# Patient Record
Sex: Female | Born: 1935
Health system: Southern US, Community
[De-identification: ages and names within clinical notes are randomized; demographics above are authoritative.]

## PROBLEM LIST (undated history)

## (undated) ENCOUNTER — Emergency Department: Payer: Medicare Other

## (undated) DIAGNOSIS — Z923 Personal history of irradiation: Secondary | ICD-10-CM

## (undated) DIAGNOSIS — Z86711 Personal history of pulmonary embolism: Secondary | ICD-10-CM

## (undated) DIAGNOSIS — Z803 Family history of malignant neoplasm of breast: Secondary | ICD-10-CM

## (undated) DIAGNOSIS — R569 Unspecified convulsions: Secondary | ICD-10-CM

## (undated) DIAGNOSIS — Z85820 Personal history of malignant melanoma of skin: Secondary | ICD-10-CM

## (undated) DIAGNOSIS — K635 Polyp of colon: Secondary | ICD-10-CM

## (undated) DIAGNOSIS — I10 Essential (primary) hypertension: Secondary | ICD-10-CM

## (undated) DIAGNOSIS — C439 Malignant melanoma of skin, unspecified: Secondary | ICD-10-CM

## (undated) DIAGNOSIS — R42 Dizziness and giddiness: Secondary | ICD-10-CM

## (undated) HISTORY — DX: Malignant melanoma of skin, unspecified: C43.9

## (undated) HISTORY — DX: Family history of malignant neoplasm of breast: Z80.3

## (undated) HISTORY — DX: Personal history of malignant melanoma of skin: Z85.820

## (undated) HISTORY — DX: Essential (primary) hypertension: I10

## (undated) HISTORY — DX: Personal history of pulmonary embolism: Z86.711

## (undated) HISTORY — DX: Unspecified convulsions: R56.9

## (undated) HISTORY — DX: Polyp of colon: K63.5

---

## 1991-12-04 DIAGNOSIS — R569 Unspecified convulsions: Secondary | ICD-10-CM

## 1991-12-04 HISTORY — DX: Unspecified convulsions: R56.9

## 2001-12-03 DIAGNOSIS — Z85820 Personal history of malignant melanoma of skin: Secondary | ICD-10-CM

## 2001-12-03 DIAGNOSIS — C439 Malignant melanoma of skin, unspecified: Secondary | ICD-10-CM

## 2001-12-03 HISTORY — DX: Malignant melanoma of skin, unspecified: C43.9

## 2001-12-03 HISTORY — DX: Personal history of malignant melanoma of skin: Z85.820

## 2001-12-03 HISTORY — PX: OTHER SURGICAL HISTORY: SHX169

## 2009-12-03 DIAGNOSIS — Z86711 Personal history of pulmonary embolism: Secondary | ICD-10-CM

## 2009-12-03 HISTORY — DX: Personal history of pulmonary embolism: Z86.711

## 2011-05-04 HISTORY — PX: OTHER SURGICAL HISTORY: SHX169

## 2011-05-20 DIAGNOSIS — R55 Syncope and collapse: Secondary | ICD-10-CM | POA: Insufficient documentation

## 2011-05-21 DIAGNOSIS — S61419A Laceration without foreign body of unspecified hand, initial encounter: Secondary | ICD-10-CM | POA: Insufficient documentation

## 2011-05-21 DIAGNOSIS — Z86718 Personal history of other venous thrombosis and embolism: Secondary | ICD-10-CM | POA: Insufficient documentation

## 2011-08-12 ENCOUNTER — Emergency Department (HOSPITAL_COMMUNITY)
Admission: EM | Admit: 2011-08-12 | Discharge: 2011-08-12 | Disposition: A | Discharge status: Home / Self Care | Payer: Medicare Other | Attending: Emergency Medicine | Admitting: Emergency Medicine

## 2011-08-12 DIAGNOSIS — M62838 Other muscle spasm: Secondary | ICD-10-CM | POA: Insufficient documentation

## 2011-08-12 DIAGNOSIS — I10 Essential (primary) hypertension: Secondary | ICD-10-CM | POA: Insufficient documentation

## 2011-08-12 DIAGNOSIS — Z86718 Personal history of other venous thrombosis and embolism: Secondary | ICD-10-CM | POA: Insufficient documentation

## 2011-08-12 DIAGNOSIS — F411 Generalized anxiety disorder: Secondary | ICD-10-CM | POA: Insufficient documentation

## 2011-08-12 DIAGNOSIS — Z7901 Long term (current) use of anticoagulants: Secondary | ICD-10-CM | POA: Insufficient documentation

## 2011-08-12 DIAGNOSIS — R259 Unspecified abnormal involuntary movements: Secondary | ICD-10-CM | POA: Insufficient documentation

## 2011-08-12 LAB — POCT I-STAT, CHEM 8
Calcium, Ion: 1.23 mmol/L (ref 1.12–1.32)
HCT: 40 % (ref 36.0–46.0)
TCO2: 25 mmol/L (ref 0–100)

## 2011-08-12 LAB — PROTIME-INR
INR: 1.97 — ABNORMAL HIGH (ref 0.00–1.49)
Prothrombin Time: 22.8 seconds — ABNORMAL HIGH (ref 11.6–15.2)

## 2011-12-05 DIAGNOSIS — Z7901 Long term (current) use of anticoagulants: Secondary | ICD-10-CM | POA: Diagnosis not present

## 2011-12-12 DIAGNOSIS — Z7901 Long term (current) use of anticoagulants: Secondary | ICD-10-CM | POA: Diagnosis not present

## 2011-12-19 DIAGNOSIS — M79609 Pain in unspecified limb: Secondary | ICD-10-CM | POA: Diagnosis not present

## 2011-12-19 DIAGNOSIS — B351 Tinea unguium: Secondary | ICD-10-CM | POA: Diagnosis not present

## 2011-12-19 DIAGNOSIS — Z7901 Long term (current) use of anticoagulants: Secondary | ICD-10-CM | POA: Diagnosis not present

## 2011-12-20 DIAGNOSIS — M654 Radial styloid tenosynovitis [de Quervain]: Secondary | ICD-10-CM | POA: Diagnosis not present

## 2011-12-24 DIAGNOSIS — Y929 Unspecified place or not applicable: Secondary | ICD-10-CM | POA: Diagnosis not present

## 2011-12-24 DIAGNOSIS — X58XXXA Exposure to other specified factors, initial encounter: Secondary | ICD-10-CM | POA: Diagnosis not present

## 2011-12-24 DIAGNOSIS — M79609 Pain in unspecified limb: Secondary | ICD-10-CM | POA: Diagnosis not present

## 2011-12-24 DIAGNOSIS — IMO0002 Reserved for concepts with insufficient information to code with codable children: Secondary | ICD-10-CM | POA: Diagnosis not present

## 2011-12-28 DIAGNOSIS — IMO0001 Reserved for inherently not codable concepts without codable children: Secondary | ICD-10-CM | POA: Diagnosis not present

## 2012-01-04 DIAGNOSIS — IMO0001 Reserved for inherently not codable concepts without codable children: Secondary | ICD-10-CM | POA: Diagnosis not present

## 2012-01-04 DIAGNOSIS — M79609 Pain in unspecified limb: Secondary | ICD-10-CM | POA: Diagnosis not present

## 2012-01-08 DIAGNOSIS — N812 Incomplete uterovaginal prolapse: Secondary | ICD-10-CM | POA: Diagnosis not present

## 2012-01-08 DIAGNOSIS — H2510 Age-related nuclear cataract, unspecified eye: Secondary | ICD-10-CM | POA: Diagnosis not present

## 2012-01-08 DIAGNOSIS — H524 Presbyopia: Secondary | ICD-10-CM | POA: Diagnosis not present

## 2012-01-08 DIAGNOSIS — H25019 Cortical age-related cataract, unspecified eye: Secondary | ICD-10-CM | POA: Diagnosis not present

## 2012-01-08 DIAGNOSIS — A58 Granuloma inguinale: Secondary | ICD-10-CM | POA: Diagnosis not present

## 2012-01-08 DIAGNOSIS — N8111 Cystocele, midline: Secondary | ICD-10-CM | POA: Diagnosis not present

## 2012-01-08 DIAGNOSIS — N952 Postmenopausal atrophic vaginitis: Secondary | ICD-10-CM | POA: Diagnosis not present

## 2012-01-08 DIAGNOSIS — H526 Other disorders of refraction: Secondary | ICD-10-CM | POA: Diagnosis not present

## 2012-01-08 DIAGNOSIS — Z4689 Encounter for fitting and adjustment of other specified devices: Secondary | ICD-10-CM | POA: Diagnosis not present

## 2012-01-09 DIAGNOSIS — Z7901 Long term (current) use of anticoagulants: Secondary | ICD-10-CM | POA: Diagnosis not present

## 2012-01-16 DIAGNOSIS — Z7901 Long term (current) use of anticoagulants: Secondary | ICD-10-CM | POA: Diagnosis not present

## 2012-01-29 DIAGNOSIS — Z7901 Long term (current) use of anticoagulants: Secondary | ICD-10-CM | POA: Diagnosis not present

## 2012-02-27 DIAGNOSIS — Z7901 Long term (current) use of anticoagulants: Secondary | ICD-10-CM | POA: Diagnosis not present

## 2012-03-19 DIAGNOSIS — M79609 Pain in unspecified limb: Secondary | ICD-10-CM | POA: Diagnosis not present

## 2012-03-19 DIAGNOSIS — B351 Tinea unguium: Secondary | ICD-10-CM | POA: Diagnosis not present

## 2012-03-19 DIAGNOSIS — Z7901 Long term (current) use of anticoagulants: Secondary | ICD-10-CM | POA: Diagnosis not present

## 2012-03-20 DIAGNOSIS — Z09 Encounter for follow-up examination after completed treatment for conditions other than malignant neoplasm: Secondary | ICD-10-CM | POA: Diagnosis not present

## 2012-03-20 DIAGNOSIS — M65849 Other synovitis and tenosynovitis, unspecified hand: Secondary | ICD-10-CM | POA: Diagnosis not present

## 2012-03-20 DIAGNOSIS — M65839 Other synovitis and tenosynovitis, unspecified forearm: Secondary | ICD-10-CM | POA: Diagnosis not present

## 2012-04-01 DIAGNOSIS — G40909 Epilepsy, unspecified, not intractable, without status epilepticus: Secondary | ICD-10-CM | POA: Diagnosis not present

## 2012-04-01 DIAGNOSIS — R002 Palpitations: Secondary | ICD-10-CM | POA: Diagnosis not present

## 2012-04-01 DIAGNOSIS — M5137 Other intervertebral disc degeneration, lumbosacral region: Secondary | ICD-10-CM | POA: Diagnosis not present

## 2012-04-01 DIAGNOSIS — Z7901 Long term (current) use of anticoagulants: Secondary | ICD-10-CM | POA: Diagnosis not present

## 2012-04-01 DIAGNOSIS — D709 Neutropenia, unspecified: Secondary | ICD-10-CM | POA: Diagnosis not present

## 2012-04-01 DIAGNOSIS — E7800 Pure hypercholesterolemia, unspecified: Secondary | ICD-10-CM | POA: Diagnosis not present

## 2012-04-01 DIAGNOSIS — N8111 Cystocele, midline: Secondary | ICD-10-CM | POA: Diagnosis not present

## 2012-04-01 DIAGNOSIS — I10 Essential (primary) hypertension: Secondary | ICD-10-CM | POA: Diagnosis not present

## 2012-04-01 DIAGNOSIS — R3129 Other microscopic hematuria: Secondary | ICD-10-CM | POA: Diagnosis not present

## 2012-04-08 DIAGNOSIS — R3129 Other microscopic hematuria: Secondary | ICD-10-CM | POA: Diagnosis not present

## 2012-04-08 DIAGNOSIS — I10 Essential (primary) hypertension: Secondary | ICD-10-CM | POA: Diagnosis not present

## 2012-04-08 DIAGNOSIS — G40909 Epilepsy, unspecified, not intractable, without status epilepticus: Secondary | ICD-10-CM | POA: Diagnosis not present

## 2012-04-08 DIAGNOSIS — E7800 Pure hypercholesterolemia, unspecified: Secondary | ICD-10-CM | POA: Diagnosis not present

## 2012-04-09 DIAGNOSIS — R3129 Other microscopic hematuria: Secondary | ICD-10-CM | POA: Diagnosis not present

## 2012-04-09 DIAGNOSIS — G40909 Epilepsy, unspecified, not intractable, without status epilepticus: Secondary | ICD-10-CM | POA: Diagnosis not present

## 2012-04-09 DIAGNOSIS — H6120 Impacted cerumen, unspecified ear: Secondary | ICD-10-CM | POA: Diagnosis not present

## 2012-04-09 DIAGNOSIS — D1803 Hemangioma of intra-abdominal structures: Secondary | ICD-10-CM | POA: Diagnosis not present

## 2012-04-09 DIAGNOSIS — I2782 Chronic pulmonary embolism: Secondary | ICD-10-CM | POA: Diagnosis not present

## 2012-04-09 DIAGNOSIS — Z7901 Long term (current) use of anticoagulants: Secondary | ICD-10-CM | POA: Diagnosis not present

## 2012-04-09 DIAGNOSIS — Z7189 Other specified counseling: Secondary | ICD-10-CM | POA: Diagnosis not present

## 2012-04-09 DIAGNOSIS — Z85820 Personal history of malignant melanoma of skin: Secondary | ICD-10-CM | POA: Diagnosis not present

## 2012-04-09 DIAGNOSIS — K824 Cholesterolosis of gallbladder: Secondary | ICD-10-CM | POA: Diagnosis not present

## 2012-04-09 DIAGNOSIS — I10 Essential (primary) hypertension: Secondary | ICD-10-CM | POA: Diagnosis not present

## 2012-04-09 DIAGNOSIS — D709 Neutropenia, unspecified: Secondary | ICD-10-CM | POA: Diagnosis not present

## 2012-04-09 DIAGNOSIS — M5137 Other intervertebral disc degeneration, lumbosacral region: Secondary | ICD-10-CM | POA: Diagnosis not present

## 2012-04-09 DIAGNOSIS — R002 Palpitations: Secondary | ICD-10-CM | POA: Diagnosis not present

## 2012-04-09 DIAGNOSIS — E7800 Pure hypercholesterolemia, unspecified: Secondary | ICD-10-CM | POA: Diagnosis not present

## 2012-04-09 DIAGNOSIS — Q441 Other congenital malformations of gallbladder: Secondary | ICD-10-CM | POA: Diagnosis not present

## 2012-04-09 DIAGNOSIS — N8111 Cystocele, midline: Secondary | ICD-10-CM | POA: Diagnosis not present

## 2012-04-09 DIAGNOSIS — Z5181 Encounter for therapeutic drug level monitoring: Secondary | ICD-10-CM | POA: Diagnosis not present

## 2012-04-11 DIAGNOSIS — N8111 Cystocele, midline: Secondary | ICD-10-CM | POA: Diagnosis not present

## 2012-04-11 DIAGNOSIS — Z4689 Encounter for fitting and adjustment of other specified devices: Secondary | ICD-10-CM | POA: Diagnosis not present

## 2012-04-15 DIAGNOSIS — D1803 Hemangioma of intra-abdominal structures: Secondary | ICD-10-CM | POA: Diagnosis not present

## 2012-04-15 DIAGNOSIS — R059 Cough, unspecified: Secondary | ICD-10-CM | POA: Diagnosis not present

## 2012-04-15 DIAGNOSIS — R3129 Other microscopic hematuria: Secondary | ICD-10-CM | POA: Diagnosis not present

## 2012-04-15 DIAGNOSIS — R05 Cough: Secondary | ICD-10-CM | POA: Diagnosis not present

## 2012-04-15 DIAGNOSIS — Q447 Other congenital malformations of liver: Secondary | ICD-10-CM | POA: Diagnosis not present

## 2012-04-15 DIAGNOSIS — Q441 Other congenital malformations of gallbladder: Secondary | ICD-10-CM | POA: Diagnosis not present

## 2012-04-30 DIAGNOSIS — R42 Dizziness and giddiness: Secondary | ICD-10-CM | POA: Diagnosis not present

## 2012-04-30 DIAGNOSIS — Z7901 Long term (current) use of anticoagulants: Secondary | ICD-10-CM | POA: Diagnosis not present

## 2012-05-02 DIAGNOSIS — H7200 Central perforation of tympanic membrane, unspecified ear: Secondary | ICD-10-CM | POA: Diagnosis not present

## 2012-05-02 DIAGNOSIS — H9190 Unspecified hearing loss, unspecified ear: Secondary | ICD-10-CM | POA: Diagnosis not present

## 2012-05-02 DIAGNOSIS — Z7901 Long term (current) use of anticoagulants: Secondary | ICD-10-CM | POA: Diagnosis not present

## 2012-05-05 DIAGNOSIS — Z7901 Long term (current) use of anticoagulants: Secondary | ICD-10-CM | POA: Diagnosis not present

## 2012-05-12 DIAGNOSIS — Z7901 Long term (current) use of anticoagulants: Secondary | ICD-10-CM | POA: Diagnosis not present

## 2012-05-13 ENCOUNTER — Ambulatory Visit (INDEPENDENT_AMBULATORY_CARE_PROVIDER_SITE_OTHER): Payer: Medicare Other | Admitting: Family Medicine

## 2012-05-13 ENCOUNTER — Encounter: Payer: Self-pay | Admitting: Family Medicine

## 2012-05-13 VITALS — BP 140/82 | HR 76 | Temp 97.6°F | Ht 64.25 in | Wt 153.0 lb

## 2012-05-13 DIAGNOSIS — Z4689 Encounter for fitting and adjustment of other specified devices: Secondary | ICD-10-CM

## 2012-05-13 DIAGNOSIS — Z85820 Personal history of malignant melanoma of skin: Secondary | ICD-10-CM | POA: Insufficient documentation

## 2012-05-13 DIAGNOSIS — B351 Tinea unguium: Secondary | ICD-10-CM | POA: Diagnosis not present

## 2012-05-13 DIAGNOSIS — Z86711 Personal history of pulmonary embolism: Secondary | ICD-10-CM | POA: Insufficient documentation

## 2012-05-13 DIAGNOSIS — I10 Essential (primary) hypertension: Secondary | ICD-10-CM | POA: Insufficient documentation

## 2012-05-13 DIAGNOSIS — N814 Uterovaginal prolapse, unspecified: Secondary | ICD-10-CM | POA: Diagnosis not present

## 2012-05-13 DIAGNOSIS — R569 Unspecified convulsions: Secondary | ICD-10-CM

## 2012-05-13 NOTE — Progress Notes (Signed)
Subjective:    Patient ID: Becky Farrell, female    DOB: July 15, 1934, 78 y.o.   MRN: [id number]  HPI  Very pleasant 78 yo female here to establish care.  She and her husband recently moved to [facility] from [place] to be closer to her children and grandchildren.  H/o PE- Had multiple PEs in 2011 when driving [place] the country. Started on coumadin and stopped it 6 months later at the advise of her physician since it was her only event.  Had another PE months later, restarted on life long coumadin. Just had INR checked yesterday at [facility]- PT 2.57.  Uterine prolapse- Has been wearing a pessary, getting it adjusted and cleaned every 3 months.  Needs GYN referral to continue this here. Incontinence has improved and tolerating the pessary well.  H/o malignant melanoma- s/p excision on her right leg in 2003.  HTN- on lisinopril 20 mg daily and HCTZ 25 mg daily. Denies any CP, SOB or LE edema.  H/o seizure- had one seizure in 2003. Per pt, had full work up. Has been on Tegretol since. No recurrent seizures.  Patient Active Problem List  Diagnoses  . Hypertension  . Seizures  . History of pulmonary embolism  . Pessary maintenance  . Uterine prolapse  . History of melanoma   Past Medical History  Diagnosis Date  . Hypertension   . Seizures   . History of pulmonary embolism   . Melanoma 2003  . Colon polyps    Past Surgical History  Procedure Date  . Left hand surgery 05/2011    s/p fall   History  Substance Use Topics  . Smoking status: Never Smoker   . Smokeless tobacco: Not on file  . Alcohol Use: Not on file   Family History  Problem Relation Age of Onset  . Heart disease Mother    Allergies  Allergen Reactions  . Clobetasol     vertigo  . Phenergan (Promethazine Hcl)     ? Rapid heart rate  . Zithromax (Azithromycin)     ? Rapid heart rate   Current Outpatient Prescriptions on File Prior to Visit  Medication Sig Dispense Refill  . atenolol (TENORMIN)  25 MG tablet Take 25 mg by mouth daily.      . calcium carbonate (OS-CAL) 600 MG TABS Take 600 mg by mouth 2 (two) times daily with a meal.      . carbamazepine (TEGRETOL) 200 MG tablet Take 200 mg by mouth 4 (four) times daily.      . hydrochlorothiazide (HYDRODIURIL) 25 MG tablet Take 25 mg by mouth daily.      Marland Kitchen lisinopril (PRINIVIL,ZESTRIL) 20 MG tablet Take 20 mg by mouth daily.      Marland Kitchen warfarin (COUMADIN) 10 MG tablet Take one tablet alternating with one half tablet daily.       The PMH, PSH, Social History, Family History, Medications, and allergies have been reviewed in [facility], and have been updated if relevant.   Review of Systems     Objective:   Physical Exam BP 140/82  Pulse 76  Temp 97.6 F (36.4 C)  Ht 5' 4.25" (1.632 m)  Wt 153 lb (69.4 kg)  BMI 26.06 kg/m2  General:  Well-developed,well-nourished,in no acute distress; alert,appropriate and cooperative throughout examination Head:  normocephalic and atraumatic.   Eyes:  vision grossly intact, pupils equal, pupils round, and pupils reactive to light.   Ears:  R ear normal and L ear normal.   Nose:  no external deformity.   Mouth:  good dentition.   Lungs:  Normal respiratory effort, chest expands symmetrically. Lungs are clear to auscultation, no crackles or wheezes. Heart:  Normal rate and regular rhythm. S1 and S2 normal without gallop, murmur, click, rub or other extra sounds. Abdomen:  Bowel sounds positive,abdomen soft and non-tender without masses, organomegaly or hernias noted. Neurologic:  alert & oriented X3 and gait normal.   Skin:  Large, well healed excisional scar on right lower leg Thickened nails- all toes, great toes most severe Psych:  Cognition and judgment appear intact. Alert and cooperative with normal attention span and concentration. No apparent delusions, illusions, hallucinations      Assessment & Plan:   1. Pessary maintenance  Stable- refer to GYN Ambulatory referral to Gynecology  2.  Uterine prolapse  Symptoms improved with pessary. See above.   3. History of melanoma  Refer to derm for yearly surveillance. Ambulatory referral to Dermatology  4. Nail fungus  Refer to podiatry for maintenance since she is on life long anticoagulation. Ambulatory referral to Podiatry  5. Hypertension  Stable on current meds.   6. History of pulmonary embolism  Life long coumadin. Continue current dose. She will check PT/INR next week at [facility].   7. Seizures  No recurrent seizures. Awaiting records- had recent blood work per pt. Continue Tegretol.

## 2012-05-13 NOTE — Patient Instructions (Signed)
It was such a pleasure to meet you. Please stop by to see Shirlee Limerick on your way out to set up your referrals. I will call you with your INR (coumadin) results after you get them drawn at [facility] next week.

## 2012-05-19 ENCOUNTER — Encounter: Payer: Self-pay | Admitting: Family Medicine

## 2012-05-19 DIAGNOSIS — Z7901 Long term (current) use of anticoagulants: Secondary | ICD-10-CM | POA: Diagnosis not present

## 2012-05-20 ENCOUNTER — Encounter: Payer: Self-pay | Admitting: Family Medicine

## 2012-05-20 ENCOUNTER — Telehealth: Payer: Self-pay | Admitting: Family Medicine

## 2012-05-20 NOTE — Telephone Encounter (Signed)
[facility] called to ask Dr Dayton Martes for an order for Occupational Therapy for patient to have a Grab Bar installed in her home. Please put order and fax to [facility] at [facility] Not urgent and can wait till Wednesday 05/21/12.

## 2012-05-21 NOTE — Telephone Encounter (Signed)
Shirlee Limerick to fax order.

## 2012-05-21 NOTE — Telephone Encounter (Signed)
Rx written.  In my box. 

## 2012-05-23 DIAGNOSIS — M6281 Muscle weakness (generalized): Secondary | ICD-10-CM | POA: Diagnosis not present

## 2012-05-26 ENCOUNTER — Encounter: Payer: Self-pay | Admitting: Family Medicine

## 2012-05-26 DIAGNOSIS — Z7901 Long term (current) use of anticoagulants: Secondary | ICD-10-CM | POA: Diagnosis not present

## 2012-05-26 DIAGNOSIS — M6281 Muscle weakness (generalized): Secondary | ICD-10-CM | POA: Diagnosis not present

## 2012-05-28 ENCOUNTER — Other Ambulatory Visit: Payer: Self-pay | Admitting: Family Medicine

## 2012-05-28 MED ORDER — WARFARIN SODIUM 10 MG PO TABS: ORAL_TABLET | ORAL | Status: DC

## 2012-05-28 NOTE — Telephone Encounter (Signed)
Ok to refill 1 month supply of warfarin. I am not familiar with the dosing for this type of lubricant jelly.  Please ask pharmacy to send [place] refill request with dose and instructions.  I know she uses it for her pessary. Thank you.

## 2012-05-28 NOTE — Telephone Encounter (Signed)
Pt does not have appointment until 07/15/12 with Dr. Lisbeth Ply the GYN physician. And she is needing Trimo-San Jelly before then. [facility] [address] application twice a week. She also needs refill on Warfarin Sodium 10 mg.

## 2012-05-28 NOTE — Telephone Encounter (Signed)
Warfarin sent to pharmacy.  Pharmacist said that pt has a refill on the gel on hold, [facility] [place].  He will fill that refill.

## 2012-06-09 DIAGNOSIS — Z7901 Long term (current) use of anticoagulants: Secondary | ICD-10-CM | POA: Diagnosis not present

## 2012-06-10 ENCOUNTER — Encounter: Payer: Self-pay | Admitting: Family Medicine

## 2012-06-16 DIAGNOSIS — Z7901 Long term (current) use of anticoagulants: Secondary | ICD-10-CM | POA: Diagnosis not present

## 2012-06-25 ENCOUNTER — Other Ambulatory Visit: Payer: Self-pay | Admitting: Family Medicine

## 2012-06-30 DIAGNOSIS — Z7901 Long term (current) use of anticoagulants: Secondary | ICD-10-CM | POA: Diagnosis not present

## 2012-07-01 ENCOUNTER — Encounter: Payer: Self-pay | Admitting: Family Medicine

## 2012-07-03 DIAGNOSIS — L570 Actinic keratosis: Secondary | ICD-10-CM | POA: Diagnosis not present

## 2012-07-03 DIAGNOSIS — L820 Inflamed seborrheic keratosis: Secondary | ICD-10-CM | POA: Diagnosis not present

## 2012-07-03 DIAGNOSIS — Z85820 Personal history of malignant melanoma of skin: Secondary | ICD-10-CM | POA: Diagnosis not present

## 2012-07-03 DIAGNOSIS — L989 Disorder of the skin and subcutaneous tissue, unspecified: Secondary | ICD-10-CM | POA: Diagnosis not present

## 2012-07-07 ENCOUNTER — Encounter: Payer: Self-pay | Admitting: Family Medicine

## 2012-07-07 DIAGNOSIS — Z7901 Long term (current) use of anticoagulants: Secondary | ICD-10-CM | POA: Diagnosis not present

## 2012-07-09 DIAGNOSIS — M2040 Other hammer toe(s) (acquired), unspecified foot: Secondary | ICD-10-CM | POA: Diagnosis not present

## 2012-07-09 DIAGNOSIS — B351 Tinea unguium: Secondary | ICD-10-CM | POA: Diagnosis not present

## 2012-07-09 DIAGNOSIS — M79609 Pain in unspecified limb: Secondary | ICD-10-CM | POA: Diagnosis not present

## 2012-07-15 DIAGNOSIS — N812 Incomplete uterovaginal prolapse: Secondary | ICD-10-CM | POA: Diagnosis not present

## 2012-07-21 ENCOUNTER — Encounter: Payer: Self-pay | Admitting: Family Medicine

## 2012-07-21 DIAGNOSIS — Z7901 Long term (current) use of anticoagulants: Secondary | ICD-10-CM | POA: Diagnosis not present

## 2012-08-05 ENCOUNTER — Other Ambulatory Visit: Payer: Self-pay

## 2012-08-05 MED ORDER — CARBAMAZEPINE 200 MG PO TABS: 200.0000 mg | ORAL_TABLET | Freq: Four times a day (QID) | ORAL | Status: DC

## 2012-08-05 NOTE — Telephone Encounter (Signed)
Pt has not had Tegretol refilled since moved here from [place]. Pt is not out of med. Pt [facility]. Pt wants to know when to schedule next appt with Dr Dayton Martes.Please advise.

## 2012-08-05 NOTE — Telephone Encounter (Signed)
Ok to refill one month with 3 refills.  I would like to see her in the next 3 months.

## 2012-08-05 NOTE — Telephone Encounter (Signed)
Medicine called to pharmacy, advised patient, follow up appt scheduled.

## 2012-08-07 ENCOUNTER — Encounter: Payer: Self-pay | Admitting: Family Medicine

## 2012-08-07 DIAGNOSIS — Z7901 Long term (current) use of anticoagulants: Secondary | ICD-10-CM | POA: Diagnosis not present

## 2012-08-14 DIAGNOSIS — N812 Incomplete uterovaginal prolapse: Secondary | ICD-10-CM | POA: Diagnosis not present

## 2012-08-20 ENCOUNTER — Ambulatory Visit (INDEPENDENT_AMBULATORY_CARE_PROVIDER_SITE_OTHER): Payer: Medicare Other

## 2012-08-20 DIAGNOSIS — Z23 Encounter for immunization: Secondary | ICD-10-CM | POA: Diagnosis not present

## 2012-09-04 ENCOUNTER — Telehealth: Payer: Self-pay

## 2012-09-04 DIAGNOSIS — Z7901 Long term (current) use of anticoagulants: Secondary | ICD-10-CM | POA: Diagnosis not present

## 2012-09-04 MED ORDER — ZOSTER VACCINE LIVE 19400 UNT/0.65ML ~~LOC~~ SOLR: 0.6500 mL | Freq: Once | SUBCUTANEOUS | Status: DC

## 2012-09-04 NOTE — Telephone Encounter (Signed)
Rx printed

## 2012-09-04 NOTE — Telephone Encounter (Signed)
Advised patient's husband that script is ready for pick up.

## 2012-09-04 NOTE — Telephone Encounter (Signed)
Pt left v/m requesting shingles vaccine rx written. Pt would like to pick up 09/05/12.Please advise.

## 2012-09-05 ENCOUNTER — Encounter: Payer: Self-pay | Admitting: Family Medicine

## 2012-09-15 DIAGNOSIS — N812 Incomplete uterovaginal prolapse: Secondary | ICD-10-CM | POA: Diagnosis not present

## 2012-09-17 DIAGNOSIS — N814 Uterovaginal prolapse, unspecified: Secondary | ICD-10-CM | POA: Diagnosis not present

## 2012-10-02 ENCOUNTER — Encounter: Payer: Self-pay | Admitting: Family Medicine

## 2012-10-02 DIAGNOSIS — Z7901 Long term (current) use of anticoagulants: Secondary | ICD-10-CM | POA: Diagnosis not present

## 2012-10-07 DIAGNOSIS — B351 Tinea unguium: Secondary | ICD-10-CM | POA: Diagnosis not present

## 2012-10-07 DIAGNOSIS — M79609 Pain in unspecified limb: Secondary | ICD-10-CM | POA: Diagnosis not present

## 2012-10-14 ENCOUNTER — Other Ambulatory Visit: Payer: Self-pay | Admitting: Family Medicine

## 2012-10-21 DIAGNOSIS — N812 Incomplete uterovaginal prolapse: Secondary | ICD-10-CM | POA: Diagnosis not present

## 2012-10-24 DIAGNOSIS — L820 Inflamed seborrheic keratosis: Secondary | ICD-10-CM | POA: Diagnosis not present

## 2012-10-24 DIAGNOSIS — L219 Seborrheic dermatitis, unspecified: Secondary | ICD-10-CM | POA: Diagnosis not present

## 2012-10-24 DIAGNOSIS — R21 Rash and other nonspecific skin eruption: Secondary | ICD-10-CM | POA: Diagnosis not present

## 2012-10-27 ENCOUNTER — Encounter: Payer: Self-pay | Admitting: Family Medicine

## 2012-10-27 DIAGNOSIS — Z7901 Long term (current) use of anticoagulants: Secondary | ICD-10-CM | POA: Diagnosis not present

## 2012-11-04 ENCOUNTER — Ambulatory Visit (INDEPENDENT_AMBULATORY_CARE_PROVIDER_SITE_OTHER): Payer: Medicare Other | Admitting: Family Medicine

## 2012-11-04 ENCOUNTER — Encounter: Payer: Self-pay | Admitting: Family Medicine

## 2012-11-04 VITALS — BP 112/70 | HR 64 | Temp 97.6°F | Wt 156.0 lb

## 2012-11-04 DIAGNOSIS — R569 Unspecified convulsions: Secondary | ICD-10-CM

## 2012-11-04 DIAGNOSIS — I10 Essential (primary) hypertension: Secondary | ICD-10-CM | POA: Diagnosis not present

## 2012-11-04 DIAGNOSIS — Z13820 Encounter for screening for osteoporosis: Secondary | ICD-10-CM

## 2012-11-04 DIAGNOSIS — Z136 Encounter for screening for cardiovascular disorders: Secondary | ICD-10-CM

## 2012-11-04 DIAGNOSIS — Z1231 Encounter for screening mammogram for malignant neoplasm of breast: Secondary | ICD-10-CM

## 2012-11-04 LAB — LIPID PANEL
Cholesterol: 264 mg/dL — ABNORMAL HIGH (ref 0–200)
Total CHOL/HDL Ratio: 3
Triglycerides: 66 mg/dL (ref 0.0–149.0)
VLDL: 13.2 mg/dL (ref 0.0–40.0)

## 2012-11-04 LAB — CBC WITH DIFFERENTIAL/PLATELET
Basophils Absolute: 0 10*3/uL (ref 0.0–0.1)
Eosinophils Absolute: 0 10*3/uL (ref 0.0–0.7)
HCT: 39.5 % (ref 36.0–46.0)
Lymphs Abs: 0.9 10*3/uL (ref 0.7–4.0)
MCHC: 32.9 g/dL (ref 30.0–36.0)
Monocytes Relative: 10.7 % (ref 3.0–12.0)
Platelets: 183 10*3/uL (ref 150.0–400.0)
RDW: 12.6 % (ref 11.5–14.6)

## 2012-11-04 LAB — COMPREHENSIVE METABOLIC PANEL
Albumin: 3.9 g/dL (ref 3.5–5.2)
Alkaline Phosphatase: 83 U/L (ref 39–117)
BUN: 15 mg/dL (ref 6–23)
GFR: 94.13 mL/min (ref >60.00)
Glucose, Bld: 93 mg/dL (ref 70–99)
Total Bilirubin: 0.5 mg/dL (ref 0.3–1.2)

## 2012-11-04 LAB — LDL CHOLESTEROL, DIRECT: Direct LDL: 158.5 mg/dL

## 2012-11-04 MED ORDER — TEGRETOL 200 MG PO TABS: ORAL_TABLET | ORAL | Status: DC

## 2012-11-04 NOTE — Progress Notes (Signed)
Subjective:    Patient ID: Becky Farrell, female    DOB: [birth year], 78 y.o.   MRN: [id number]  HPI  Very pleasant 78 yo female here for 6 month follow up.    H/o PE- Had multiple PEs in 2011 when driving across the country. Started on coumadin and stopped it 6 months later at the advise of her physician since it was her only event.  Had another PE months later, restarted on life long coumadin. Just had INR checked last week at [facility].  Has a pesssary- followed by GYN.  H/o malignant melanoma- s/p excision on her right leg in 2003.  HTN- on lisinopril 20 mg daily and HCTZ 25 mg daily. Denies any CP, SOB or LE edema.  H/o seizure- had one seizure in 2003. Per pt, had full work up. Has been on Tegretol since.  Due for labs. No recurrent seizures.  Patient Active Problem List  Diagnosis  . Hypertension  . Seizures  . History of pulmonary embolism  . Pessary maintenance  . Uterine prolapse  . History of melanoma   Past Medical History  Diagnosis Date  . Hypertension   . Seizures   . History of pulmonary embolism   . Melanoma 2003  . Colon polyps    Past Surgical History  Procedure Date  . Left hand surgery 05/2011    s/p fall   History  Substance Use Topics  . Smoking status: Never Smoker   . Smokeless tobacco: Not on file  . Alcohol Use: Not on file   Family History  Problem Relation Age of Onset  . Heart disease Mother    Allergies  Allergen Reactions  . Clobetasol     vertigo  . Phenergan (Promethazine Hcl)     ? Rapid heart rate  . Zithromax (Azithromycin)     ? Rapid heart rate   Current Outpatient Prescriptions on File Prior to Visit  Medication Sig Dispense Refill  . aspirin 81 MG tablet Take 81 mg by mouth daily.      Marland Kitchen atenolol (TENORMIN) 25 MG tablet Take 25 mg by mouth daily.      . cholecalciferol (VITAMIN D) 1000 UNITS tablet Take 1,000 Units by mouth daily.      . hydrochlorothiazide (HYDRODIURIL) 25 MG tablet Take 25 mg by mouth  daily.      Marland Kitchen lisinopril (PRINIVIL,ZESTRIL) 20 MG tablet Take 20 mg by mouth daily.      . Misc. Devices (CUBE PESSARY) MISC by Does not apply route.      Dani Gobble SULFATE VAGINAL (TRIMO-SAN) 0.025 % GEL Use one applicator full twice a week.      . TEGRETOL 200 MG tablet TAKE 1 TABLET BY MOUTH 4 TIMES A DAY  360 tablet  0  . warfarin (COUMADIN) 10 MG tablet TAKE 1 TABLET ALTERNATING WITH 1/2 TABLET DAILY.  30 tablet  6   The PMH, PSH, Social History, Family History, Medications, and allergies have been reviewed in [facility], and have been updated if relevant.   Review of Systems     Objective:   Physical Exam BP 112/70  Pulse 64  Temp 97.6 F (36.4 C)  Wt 156 lb (70.761 kg)  General:  Well-developed,well-nourished,in no acute distress; alert,appropriate and cooperative throughout examination Head:  normocephalic and atraumatic.   Eyes:  vision grossly intact, pupils equal, pupils round, and pupils reactive to light.   Ears:  R ear normal and L ear normal.   Nose:  no  external deformity.   Mouth:  good dentition.   Lungs:  Normal respiratory effort, chest expands symmetrically. Lungs are clear to auscultation, no crackles or wheezes. Heart:  Normal rate and regular rhythm. S1 and S2 normal without gallop, murmur, click, rub or other extra sounds. Abdomen:  Bowel sounds positive,abdomen soft and non-tender without masses, organomegaly or hernias noted. Neurologic:  alert & oriented X3 and gait normal.   Psych:  Cognition and judgment appear intact. Alert and cooperative with normal attention span and concentration. No apparent delusions, illusions, hallucinations      Assessment & Plan:   1. Hypertension  Stable on current meds.   2. History of pulmonary embolism  Life long coumadin. Continue current dose. INR therapeutic.   3. Seizures  No recurrent seizures.  Continue Tegretol. Check labs today. Orders Placed This Encounter  Procedures  . Comprehensive metabolic  panel  . Lipid Panel  . CBC with Differential  . Carbamazepine level, free

## 2012-11-04 NOTE — Patient Instructions (Addendum)
Calcium supplements have received some bad press lately, with questions that they may increase risk of heart attack or blood clots.  The risk is very low, however none of these risks occur with calcium in FOOD. Try to get most or all of your calcium from your food--aim for 1200 mg/day for women over 50 and men over 70.  To figure out dietary calcium: 300 mg/day from all non dairy foods plus 300 mg per cup of milk, other dairy, or fortified juice.  Non dairy foods that contain calcium:  Kale, oranges, sardines, oatmeal, soy milk/soybeans, salmon, white beans, dried figs, turnip greens, almonds, broccoli, tofu.

## 2012-11-05 MED ORDER — HYDROCHLOROTHIAZIDE 12.5 MG PO TABS: 12.5000 mg | ORAL_TABLET | Freq: Every day | ORAL | Status: DC

## 2012-11-05 NOTE — Addendum Note (Signed)
Addended by: Eliezer Bottom on: 11/05/2012 12:48 PM   Modules accepted: Orders

## 2012-11-07 LAB — CARBAMAZEPINE, FREE AND TOTAL
Carbamazepine Metabolite -: 3.4 ug/mL — ABNORMAL HIGH (ref 0.2–2.0)
Carbamazepine, Free: 1.9 ug/mL (ref 1.0–3.0)

## 2012-11-10 ENCOUNTER — Telehealth: Payer: Self-pay

## 2012-11-10 DIAGNOSIS — Z8669 Personal history of other diseases of the nervous system and sense organs: Secondary | ICD-10-CM

## 2012-11-10 NOTE — Telephone Encounter (Signed)
Noted. I will place referral as it can take months to get an appointment.  Hope her husband has a speedy recovery.

## 2012-11-10 NOTE — Telephone Encounter (Signed)
Pt would like to see neurologist about tegretol but not right away. pts husband is battling a kidney stone now. Pt said she will discuss further with Dr Dayton Martes on 11/20/12 when she has f/u appt with Dr Dayton Martes.

## 2012-11-13 ENCOUNTER — Encounter: Payer: Self-pay | Admitting: Family Medicine

## 2012-11-14 ENCOUNTER — Other Ambulatory Visit: Payer: Self-pay

## 2012-11-14 MED ORDER — WARFARIN SODIUM 10 MG PO TABS: ORAL_TABLET | ORAL | Status: DC

## 2012-11-14 NOTE — Telephone Encounter (Signed)
There does not appear to have been any recent changes to her coumadin dosing.  Jacki Cones, please call Rowe Clack, RN at [facility] (same number as Syliva Overman) to confirm her dose.  Once dose confirmed, ok to refill her coumadin and verify instructions with pt.

## 2012-11-14 NOTE — Telephone Encounter (Signed)
[facility] to [facility]ill on Warfarin 10 mg. Pharmacist told pt with instructions on bottle taking one tab alternating with 1/2 tab daily it was too early for pt to get med. Pt only has 4 tablets left. Presently pt is taking Warfarin 10 mg daily except on Friday pt takes Warfarin 15 mg. Pt wants to make sure she is taking warfarin the way Dr Aron wants pt to.Pt has labs at [facility]. Please send refill with correct instructions to [facility]. Pt request call back to clarify instructions. 

## 2012-11-14 NOTE — Telephone Encounter (Signed)
Verified dose with Mandy.  Pt takes 10 mg's everyday except fridays, takes 15 mg's on fridays.  Script called to pharmacy.

## 2012-11-14 NOTE — Telephone Encounter (Signed)
Left message asking Angelica Chessman to call me back, Morrie Sheldon is out of the office today.

## 2012-11-14 NOTE — Telephone Encounter (Deleted)
Pt went to [facility] to get refill on Warfarin 10 mg. Pharmacist told pt with instructions on bottle taking one tab alternating with 1/2 tab daily it was too early for pt to get med. Pt only has 4 tablets left. Presently pt is taking Warfarin 10 mg daily except on Friday pt takes Warfarin 15 mg. Pt wants to make sure she is taking warfarin the way Dr Dayton Martes wants pt to.Pt has labs at [facility]. Please send refill with correct instructions to [facility]. Pt request call back to clarify instructions.

## 2012-11-20 ENCOUNTER — Ambulatory Visit: Payer: Medicare Other | Admitting: Family Medicine

## 2012-11-24 DIAGNOSIS — Z7901 Long term (current) use of anticoagulants: Secondary | ICD-10-CM | POA: Diagnosis not present

## 2012-11-27 ENCOUNTER — Encounter: Payer: Self-pay | Admitting: Family Medicine

## 2012-12-04 ENCOUNTER — Encounter: Payer: Self-pay | Admitting: Family Medicine

## 2012-12-04 ENCOUNTER — Ambulatory Visit (INDEPENDENT_AMBULATORY_CARE_PROVIDER_SITE_OTHER): Payer: Medicare Other | Admitting: Family Medicine

## 2012-12-04 VITALS — BP 140/90 | HR 62 | Temp 98.2°F | Wt 155.0 lb

## 2012-12-04 DIAGNOSIS — E871 Hypo-osmolality and hyponatremia: Secondary | ICD-10-CM

## 2012-12-04 DIAGNOSIS — I10 Essential (primary) hypertension: Secondary | ICD-10-CM | POA: Diagnosis not present

## 2012-12-04 LAB — BASIC METABOLIC PANEL
Calcium: 9.3 mg/dL (ref 8.4–10.5)
GFR: 83.64 mL/min (ref >60.00)
Glucose, Bld: 110 mg/dL — ABNORMAL HIGH (ref 70–99)
Potassium: 3.8 mEq/L (ref 3.5–5.1)
Sodium: 133 mEq/L — ABNORMAL LOW (ref 135–145)

## 2012-12-04 NOTE — Patient Instructions (Addendum)
Good to see you. I will call you with your lab results tomorrow.  Happy New Year.

## 2012-12-04 NOTE — Addendum Note (Signed)
Addended by: Liane Comber C on: 12/04/2012 11:00 AM   Modules accepted: Orders

## 2012-12-04 NOTE — Progress Notes (Signed)
Subjective:    Patient ID: Becky Farrell, female    DOB: [birth year], 79 y.o.   MRN: [id number]  HPI  Very pleasant 79 yo female here for follow up.  HTN- on lisinopril 20 mg daily and we decreased her HCTZ 12.5 mg daily last month due to hyponatremia.  Here today to follow up HTN and to repeat BMET. Denies any CP, SOB or LE edema.  Lab Results  Component Value Date   NA 130* 11/04/2012   K 4.0 11/04/2012   CL 95* 11/04/2012   CO2 29 11/04/2012   Rushed to get here and under increased stress due to husband's health issues.  Patient Active Problem List  Diagnosis  . Hypertension  . Seizures  . History of pulmonary embolism  . Pessary maintenance  . Uterine prolapse  . History of melanoma  . Hyponatremia   Past Medical History  Diagnosis Date  . Hypertension   . Seizures   . History of pulmonary embolism   . Melanoma 2003  . Colon polyps    Past Surgical History  Procedure Date  . Left hand surgery 05/2011    s/p fall   History  Substance Use Topics  . Smoking status: Never Smoker   . Smokeless tobacco: Not on file  . Alcohol Use: Not on file   Family History  Problem Relation Age of Onset  . Heart disease Mother    Allergies  Allergen Reactions  . Clobetasol     vertigo  . Phenergan (Promethazine Hcl)     ? Rapid heart rate  . Zithromax (Azithromycin)     ? Rapid heart rate   Current Outpatient Prescriptions on File Prior to Visit  Medication Sig Dispense Refill  . aspirin 81 MG tablet Take 81 mg by mouth daily.      Marland Kitchen atenolol (TENORMIN) 25 MG tablet Take 25 mg by mouth daily.      . cholecalciferol (VITAMIN D) 1000 UNITS tablet Take 1,000 Units by mouth daily.      . hydrochlorothiazide (HYDRODIURIL) 12.5 MG tablet Take 1 tablet (12.5 mg total) by mouth daily.  30 tablet  3  . lisinopril (PRINIVIL,ZESTRIL) 20 MG tablet Take 20 mg by mouth daily.      . Misc. Devices (CUBE PESSARY) MISC by Does not apply route.      Dani Gobble SULFATE VAGINAL  (TRIMO-SAN) 0.025 % GEL Use one applicator full twice a week.      . TEGRETOL 200 MG tablet TAKE 1 TABLET BY MOUTH 4 TIMES A DAY  360 tablet  0  . warfarin (COUMADIN) 10 MG tablet Take 10 mg's every day except Friday.  Take 15 mg's on fridays.  30 tablet  5   The PMH, PSH, Social History, Family History, Medications, and allergies have been reviewed in [facility], and have been updated if relevant.   Review of Systems See HPI    Objective:   Physical Exam BP 140/90  Pulse 62  Temp 98.2 F (36.8 C)  Wt 155 lb (70.308 kg)  SpO2 98%  General:  Well-developed,well-nourished,in no acute distress; alert,appropriate and cooperative throughout examination Head:  normocephalic and atraumatic.   Eyes:  vision grossly intact, pupils equal, pupils round, and pupils reactive to light.   Ears:  R ear normal and L ear normal.   Nose:  no external deformity.   Mouth:  good dentition.   Lungs:  Normal respiratory effort, chest expands symmetrically. Lungs are clear to auscultation, no crackles or  wheezes. Heart:  Normal rate and regular rhythm. S1 and S2 normal without gallop, murmur, click, rub or other extra sounds. Abdomen:  Bowel sounds positive,abdomen soft and non-tender without masses, organomegaly or hernias noted. Neurologic:  alert & oriented X3 and gait normal.   Psych:  Cognition and judgment appear intact. Alert and cooperative with normal attention span and concentration. No apparent delusions, illusions, hallucinations      Assessment & Plan:   1. Hypertension  Stable.  Continue current meds. Basic metabolic panel  2. Hyponatremia  Recheck BMET today. Basic metabolic panel

## 2012-12-16 DIAGNOSIS — N812 Incomplete uterovaginal prolapse: Secondary | ICD-10-CM | POA: Diagnosis not present

## 2012-12-22 ENCOUNTER — Encounter: Payer: Self-pay | Admitting: Family Medicine

## 2012-12-22 DIAGNOSIS — Z7901 Long term (current) use of anticoagulants: Secondary | ICD-10-CM | POA: Diagnosis not present

## 2012-12-29 ENCOUNTER — Encounter: Payer: Self-pay | Admitting: Family Medicine

## 2012-12-29 DIAGNOSIS — Z7901 Long term (current) use of anticoagulants: Secondary | ICD-10-CM | POA: Diagnosis not present

## 2013-01-06 ENCOUNTER — Ambulatory Visit: Payer: Self-pay | Admitting: Family Medicine

## 2013-01-06 DIAGNOSIS — Z1231 Encounter for screening mammogram for malignant neoplasm of breast: Secondary | ICD-10-CM | POA: Diagnosis not present

## 2013-01-12 ENCOUNTER — Encounter: Payer: Self-pay | Admitting: Family Medicine

## 2013-01-12 DIAGNOSIS — Z7901 Long term (current) use of anticoagulants: Secondary | ICD-10-CM | POA: Diagnosis not present

## 2013-01-13 ENCOUNTER — Encounter: Payer: Self-pay | Admitting: Family Medicine

## 2013-01-13 ENCOUNTER — Encounter: Payer: Self-pay | Admitting: *Deleted

## 2013-01-13 LAB — HM MAMMOGRAPHY: HM Mammogram: NORMAL

## 2013-01-14 DIAGNOSIS — B351 Tinea unguium: Secondary | ICD-10-CM | POA: Diagnosis not present

## 2013-01-14 DIAGNOSIS — M79609 Pain in unspecified limb: Secondary | ICD-10-CM | POA: Diagnosis not present

## 2013-01-21 ENCOUNTER — Encounter: Payer: Self-pay | Admitting: Family Medicine

## 2013-01-21 ENCOUNTER — Ambulatory Visit: Payer: Self-pay | Admitting: Family Medicine

## 2013-01-21 DIAGNOSIS — Z780 Asymptomatic menopausal state: Secondary | ICD-10-CM | POA: Diagnosis not present

## 2013-01-21 DIAGNOSIS — I10 Essential (primary) hypertension: Secondary | ICD-10-CM | POA: Diagnosis not present

## 2013-01-21 DIAGNOSIS — R569 Unspecified convulsions: Secondary | ICD-10-CM | POA: Diagnosis not present

## 2013-01-21 DIAGNOSIS — M899 Disorder of bone, unspecified: Secondary | ICD-10-CM | POA: Diagnosis not present

## 2013-01-23 ENCOUNTER — Ambulatory Visit: Payer: Self-pay | Admitting: Neurology

## 2013-01-23 DIAGNOSIS — R569 Unspecified convulsions: Secondary | ICD-10-CM | POA: Diagnosis not present

## 2013-01-23 DIAGNOSIS — G40309 Generalized idiopathic epilepsy and epileptic syndromes, not intractable, without status epilepticus: Secondary | ICD-10-CM | POA: Diagnosis not present

## 2013-01-27 DIAGNOSIS — N812 Incomplete uterovaginal prolapse: Secondary | ICD-10-CM | POA: Diagnosis not present

## 2013-01-29 ENCOUNTER — Encounter: Payer: Self-pay | Admitting: Family Medicine

## 2013-01-29 ENCOUNTER — Ambulatory Visit (INDEPENDENT_AMBULATORY_CARE_PROVIDER_SITE_OTHER): Payer: Medicare Other | Admitting: Family Medicine

## 2013-01-29 VITALS — BP 122/80 | HR 64 | Temp 97.7°F | Wt 156.0 lb

## 2013-01-29 DIAGNOSIS — M8580 Other specified disorders of bone density and structure, unspecified site: Secondary | ICD-10-CM

## 2013-01-29 DIAGNOSIS — M810 Age-related osteoporosis without current pathological fracture: Secondary | ICD-10-CM | POA: Insufficient documentation

## 2013-01-29 DIAGNOSIS — M899 Disorder of bone, unspecified: Secondary | ICD-10-CM | POA: Diagnosis not present

## 2013-01-29 MED ORDER — VITAMIN D 1000 UNITS PO TABS: 1000.0000 [IU] | ORAL_TABLET | Freq: Every day | ORAL | Status: DC

## 2013-01-29 NOTE — Progress Notes (Signed)
Subjective:    Patient ID: Becky Farrell, female    DOB: March 17, 1934, 79 y.o.   MRN: [id number]  HPI  Very pleasant 79 yo female here to discuss bone density results.  Femur T score -1.6, Spine  -0.2, radius -1.9.   She does take Vit D 2000 IU daily.  Was taking Calcium but stopped due to worsening constipation.  No h/o fractures.   Patient Active Problem List  Diagnosis  . Hypertension  . Seizures  . History of pulmonary embolism  . Pessary maintenance  . Uterine prolapse  . History of melanoma  . Hyponatremia  . Osteopenia   Past Medical History  Diagnosis Date  . Hypertension   . Seizures   . History of pulmonary embolism   . Melanoma 2003  . Colon polyps    Past Surgical History  Procedure Laterality Date  . Left hand surgery  05/2011    s/p fall   History  Substance Use Topics  . Smoking status: Never Smoker   . Smokeless tobacco: Not on file  . Alcohol Use: Not on file   Family History  Problem Relation Age of Onset  . Heart disease Mother    Allergies  Allergen Reactions  . Clobetasol     vertigo  . Phenergan (Promethazine Hcl)     ? Rapid heart rate  . Zithromax (Azithromycin)     ? Rapid heart rate   Current Outpatient Prescriptions on File Prior to Visit  Medication Sig Dispense Refill  . aspirin 81 MG tablet Take 81 mg by mouth daily.      Marland Kitchen atenolol (TENORMIN) 25 MG tablet Take 25 mg by mouth daily.      . hydrochlorothiazide (HYDRODIURIL) 12.5 MG tablet Take 1 tablet (12.5 mg total) by mouth daily.  30 tablet  3  . lisinopril (PRINIVIL,ZESTRIL) 20 MG tablet Take 20 mg by mouth daily.      . Misc. Devices (CUBE PESSARY) MISC by Does not apply route.      . TEGRETOL 200 MG tablet TAKE 1 TABLET BY MOUTH 4 TIMES A DAY  360 tablet  0  . warfarin (COUMADIN) 10 MG tablet Take 10 mg's every day except Friday.  Take 15 mg's on fridays.  30 tablet  5   No current facility-administered medications on file prior to visit.   The PMH, PSH, Social  History, Family History, Medications, and allergies have been reviewed in [facility], and have been updated if relevant.   Review of Systems See HPI    Objective:   Physical Exam BP 122/80  Pulse 64  Temp(Src) 97.7 F (36.5 C)  Wt 156 lb (70.761 kg)  BMI 26.57 kg/m2   General:  Well-developed,well-nourished,in no acute distress; alert,appropriate and cooperative throughout examination Head:  normocephalic and atraumatic.   Eyes:  vision grossly intact, pupils equal, pupils round, and pupils reactive to light.   Ears:  R ear normal and L ear normal.   Nose:  no external deformity.   Mouth:  good dentition.   Lungs:  Normal respiratory effort, chest expands symmetrically. Lungs are clear to auscultation, no crackles or wheezes. Heart:  Normal rate and regular rhythm. S1 and S2 normal without gallop, murmur, click, rub or other extra sounds. Abdomen:  Bowel sounds positive,abdomen soft and non-tender without masses, organomegaly or hernias noted. Neurologic:  alert & oriented X3 and gait normal.   Psych:  Cognition and judgment appear intact. Alert and cooperative with normal attention span and concentration. No  apparent delusions, illusions, hallucinations      Assessment & Plan:   1. Osteopenia >25 min spent with face to face with patient, >50% counseling and/or coordinating care She is currently involved in exercise (weight bearing included) at [facility]. She is very hesitant to start bisphosphate. Discussed dietary sources of calcium (see AVS), continue Vit D and weight bearing exercise. Repeat DEXA in 1 year. The patient indicates understanding of these issues and agrees with the plan.

## 2013-01-29 NOTE — Patient Instructions (Addendum)
  Try to get most or all of your calcium from your food--aim for 1200 mg/day for women over 50 and men over 70.  To figure out dietary calcium: 300 mg/day from all non dairy foods plus 300 mg per cup of milk, other dairy, or fortified juice.  Non dairy foods that contain calcium:  Kale, oranges, sardines, oatmeal, soy milk/soybeans, salmon, white beans, dried figs, turnip greens, almonds, broccoli, tofu.  Please increased physical activity (weight bearing) as discussed.  We will repeat your bone density in 1 year.

## 2013-01-30 ENCOUNTER — Telehealth: Payer: Self-pay

## 2013-01-30 NOTE — Telephone Encounter (Signed)
Pt seen 01/29/13 and to call back info for premarin; pt presently using Premarin vaginal cream-appl. Insert 0.5 grams as directed once weekly. Pt is not sure if 0.625 mg/gm or not; states does not say on medication box. Pt does not need refill. Spoke Meagan with at [facility] and premarin vaginal cream is 0.625 mg/gm.Med list updatesd.

## 2013-02-04 ENCOUNTER — Telehealth: Payer: Self-pay

## 2013-02-04 NOTE — Telephone Encounter (Signed)
Pt has discussed with Dr Dayton Martes about changing Tegretol; pt saw Dr Malvin Johns and had EEG; pt has enough Tegretol to last one week. Pt wants to know status of Dr Dayton Martes talking with Dr Malvin Johns about what med pt will begin when finishes Tegretol. [facility]. Pt wants to know if will be able to stop Tegretol or will she need to wean off. Pt request call back.

## 2013-02-04 NOTE — Telephone Encounter (Signed)
Spoke with pt, verbalizes understanding.

## 2013-02-04 NOTE — Telephone Encounter (Signed)
She would likely need to wean off but Dr. Malvin Johns needs to make this determination.  Please call Dr. Geralyn Flash office to advise pt since she is about to run out of Tegretol.

## 2013-02-09 ENCOUNTER — Encounter: Payer: Self-pay | Admitting: Family Medicine

## 2013-02-09 DIAGNOSIS — Z7901 Long term (current) use of anticoagulants: Secondary | ICD-10-CM | POA: Diagnosis not present

## 2013-03-02 DIAGNOSIS — E669 Obesity, unspecified: Secondary | ICD-10-CM | POA: Diagnosis not present

## 2013-03-02 DIAGNOSIS — I10 Essential (primary) hypertension: Secondary | ICD-10-CM | POA: Diagnosis not present

## 2013-03-02 DIAGNOSIS — R569 Unspecified convulsions: Secondary | ICD-10-CM | POA: Diagnosis not present

## 2013-03-09 ENCOUNTER — Encounter: Payer: Self-pay | Admitting: Family Medicine

## 2013-03-09 DIAGNOSIS — Z7901 Long term (current) use of anticoagulants: Secondary | ICD-10-CM | POA: Diagnosis not present

## 2013-04-06 DIAGNOSIS — Z7901 Long term (current) use of anticoagulants: Secondary | ICD-10-CM | POA: Diagnosis not present

## 2013-04-06 LAB — PROTIME-INR

## 2013-04-07 ENCOUNTER — Encounter: Payer: Self-pay | Admitting: Family Medicine

## 2013-04-13 ENCOUNTER — Encounter: Payer: Self-pay | Admitting: Family Medicine

## 2013-04-13 DIAGNOSIS — Z7901 Long term (current) use of anticoagulants: Secondary | ICD-10-CM | POA: Diagnosis not present

## 2013-04-13 DIAGNOSIS — B351 Tinea unguium: Secondary | ICD-10-CM | POA: Diagnosis not present

## 2013-04-13 DIAGNOSIS — M79609 Pain in unspecified limb: Secondary | ICD-10-CM | POA: Diagnosis not present

## 2013-04-22 ENCOUNTER — Emergency Department: Payer: Self-pay | Admitting: Emergency Medicine

## 2013-04-22 DIAGNOSIS — E860 Dehydration: Secondary | ICD-10-CM | POA: Diagnosis not present

## 2013-04-22 DIAGNOSIS — I10 Essential (primary) hypertension: Secondary | ICD-10-CM | POA: Diagnosis not present

## 2013-04-22 DIAGNOSIS — R002 Palpitations: Secondary | ICD-10-CM | POA: Diagnosis not present

## 2013-04-22 DIAGNOSIS — Z86718 Personal history of other venous thrombosis and embolism: Secondary | ICD-10-CM | POA: Diagnosis not present

## 2013-04-22 DIAGNOSIS — R6889 Other general symptoms and signs: Secondary | ICD-10-CM | POA: Diagnosis not present

## 2013-04-22 DIAGNOSIS — R569 Unspecified convulsions: Secondary | ICD-10-CM | POA: Diagnosis not present

## 2013-04-22 DIAGNOSIS — Z79899 Other long term (current) drug therapy: Secondary | ICD-10-CM | POA: Diagnosis not present

## 2013-04-22 DIAGNOSIS — R000 Tachycardia, unspecified: Secondary | ICD-10-CM | POA: Diagnosis not present

## 2013-04-22 LAB — BASIC METABOLIC PANEL
Anion Gap: 5 — ABNORMAL LOW (ref 7–16)
BUN: 26 mg/dL — ABNORMAL HIGH (ref 7–18)
Calcium, Total: 9.7 mg/dL (ref 8.5–10.1)
Chloride: 106 mmol/L (ref 98–107)
Co2: 29 mmol/L (ref 21–32)
Creatinine: 0.75 mg/dL (ref 0.60–1.30)
EGFR (African American): >60
EGFR (Non-African Amer.): >60
Osmolality: 284 (ref 275–301)
Potassium: 3.6 mmol/L (ref 3.5–5.1)
Sodium: 140 mmol/L (ref 136–145)

## 2013-04-22 LAB — CK TOTAL AND CKMB (NOT AT ARMC)
CK, Total: 82 U/L (ref 21–215)
CK, Total: 67 U/L (ref 21–215)

## 2013-04-22 LAB — CBC
MCV: 96 fL (ref 80–100)
Platelet: 174 10*3/uL (ref 150–440)
RBC: 4.14 10*6/uL (ref 3.80–5.20)
WBC: 6 10*3/uL (ref 3.6–11.0)

## 2013-04-22 LAB — TROPONIN I: Troponin-I: <0.02 ng/mL

## 2013-04-29 DIAGNOSIS — N812 Incomplete uterovaginal prolapse: Secondary | ICD-10-CM | POA: Diagnosis not present

## 2013-04-30 DIAGNOSIS — Z7901 Long term (current) use of anticoagulants: Secondary | ICD-10-CM | POA: Diagnosis not present

## 2013-05-01 ENCOUNTER — Encounter: Payer: Self-pay | Admitting: Family Medicine

## 2013-05-04 ENCOUNTER — Encounter: Payer: Self-pay | Admitting: Family Medicine

## 2013-05-04 DIAGNOSIS — Z7901 Long term (current) use of anticoagulants: Secondary | ICD-10-CM | POA: Diagnosis not present

## 2013-05-11 DIAGNOSIS — Z7901 Long term (current) use of anticoagulants: Secondary | ICD-10-CM | POA: Diagnosis not present

## 2013-05-11 DIAGNOSIS — I10 Essential (primary) hypertension: Secondary | ICD-10-CM | POA: Diagnosis not present

## 2013-05-11 DIAGNOSIS — G478 Other sleep disorders: Secondary | ICD-10-CM | POA: Diagnosis not present

## 2013-05-11 DIAGNOSIS — E669 Obesity, unspecified: Secondary | ICD-10-CM | POA: Diagnosis not present

## 2013-05-11 DIAGNOSIS — R569 Unspecified convulsions: Secondary | ICD-10-CM | POA: Diagnosis not present

## 2013-05-14 ENCOUNTER — Encounter: Payer: Self-pay | Admitting: Family Medicine

## 2013-05-14 DIAGNOSIS — Z7901 Long term (current) use of anticoagulants: Secondary | ICD-10-CM | POA: Diagnosis not present

## 2013-05-18 DIAGNOSIS — Z7901 Long term (current) use of anticoagulants: Secondary | ICD-10-CM | POA: Diagnosis not present

## 2013-05-23 ENCOUNTER — Other Ambulatory Visit: Payer: Self-pay | Admitting: Family Medicine

## 2013-06-01 ENCOUNTER — Encounter: Payer: Self-pay | Admitting: Family Medicine

## 2013-06-01 DIAGNOSIS — Z7901 Long term (current) use of anticoagulants: Secondary | ICD-10-CM | POA: Diagnosis not present

## 2013-06-05 ENCOUNTER — Other Ambulatory Visit: Payer: Self-pay | Admitting: Family Medicine

## 2013-06-18 DIAGNOSIS — Z7901 Long term (current) use of anticoagulants: Secondary | ICD-10-CM | POA: Diagnosis not present

## 2013-06-18 LAB — POCT INR: INR: 2.8

## 2013-06-19 ENCOUNTER — Ambulatory Visit (INDEPENDENT_AMBULATORY_CARE_PROVIDER_SITE_OTHER): Payer: Medicare Other | Admitting: Family Medicine

## 2013-06-19 ENCOUNTER — Telehealth: Payer: Self-pay

## 2013-06-19 NOTE — Telephone Encounter (Signed)
Pt said she is at independent living at [facility] and pt has been cutting a Warfarin 10 mg and has been on warfarin 7.5 mg. Pt is out of Warfarin 10 mg and pt request a new rx of Warfarin 7.[facility] to [facility]. Pt said warfarin 10 mg on auto refill at [facility] but has not picked up med yet and request 10 mg d/c and new rx for 7.5 mg sent to [facility]. Pt request cb when done.

## 2013-06-19 NOTE — Telephone Encounter (Signed)
Yes ok to refill as requested- 30 tablets and ok to place on auto refill as well.

## 2013-07-03 DIAGNOSIS — L819 Disorder of pigmentation, unspecified: Secondary | ICD-10-CM | POA: Diagnosis not present

## 2013-07-03 DIAGNOSIS — Z85820 Personal history of malignant melanoma of skin: Secondary | ICD-10-CM | POA: Diagnosis not present

## 2013-07-03 DIAGNOSIS — L570 Actinic keratosis: Secondary | ICD-10-CM | POA: Diagnosis not present

## 2013-07-03 DIAGNOSIS — W57XXXA Bitten or stung by nonvenomous insect and other nonvenomous arthropods, initial encounter: Secondary | ICD-10-CM | POA: Diagnosis not present

## 2013-07-03 DIAGNOSIS — T148 Other injury of unspecified body region: Secondary | ICD-10-CM | POA: Diagnosis not present

## 2013-07-10 ENCOUNTER — Telehealth: Payer: Self-pay

## 2013-07-10 NOTE — Telephone Encounter (Signed)
Pt changed her INR appt until 07/13/13 and will call Carlena Sax on 07/13/13 to see if need new dosage of med called to pharmacy.

## 2013-07-13 ENCOUNTER — Ambulatory Visit (INDEPENDENT_AMBULATORY_CARE_PROVIDER_SITE_OTHER): Payer: Medicare Other | Admitting: Family Medicine

## 2013-07-13 DIAGNOSIS — Z7901 Long term (current) use of anticoagulants: Secondary | ICD-10-CM | POA: Diagnosis not present

## 2013-07-13 LAB — POCT INR: INR: 3.4

## 2013-07-13 NOTE — Patient Instructions (Signed)
Skip Coumadin today and then continue taking 7.5mg  daily.  Recheck in 4 weeks.  Instructions given to Florentina Addison, Charity fundraiser at [facility].

## 2013-07-14 ENCOUNTER — Other Ambulatory Visit: Payer: Self-pay | Admitting: Family Medicine

## 2013-07-14 MED ORDER — WARFARIN SODIUM 7.5 MG PO TABS: 7.5000 mg | ORAL_TABLET | Freq: Every day | ORAL | Status: DC

## 2013-07-15 DIAGNOSIS — M79609 Pain in unspecified limb: Secondary | ICD-10-CM | POA: Diagnosis not present

## 2013-07-15 DIAGNOSIS — B351 Tinea unguium: Secondary | ICD-10-CM | POA: Diagnosis not present

## 2013-07-27 DIAGNOSIS — N812 Incomplete uterovaginal prolapse: Secondary | ICD-10-CM | POA: Diagnosis not present

## 2013-07-27 DIAGNOSIS — N760 Acute vaginitis: Secondary | ICD-10-CM | POA: Diagnosis not present

## 2013-07-28 ENCOUNTER — Other Ambulatory Visit: Payer: Self-pay | Admitting: Family Medicine

## 2013-08-10 ENCOUNTER — Ambulatory Visit (INDEPENDENT_AMBULATORY_CARE_PROVIDER_SITE_OTHER): Payer: Medicare Other | Admitting: Family Medicine

## 2013-08-10 ENCOUNTER — Telehealth: Payer: Self-pay | Admitting: Family Medicine

## 2013-08-10 DIAGNOSIS — Z7901 Long term (current) use of anticoagulants: Secondary | ICD-10-CM | POA: Diagnosis not present

## 2013-08-10 LAB — POCT INR: INR: 5.59

## 2013-08-10 NOTE — Telephone Encounter (Signed)
I am.  I just talked to Morrie Sheldon, Charity fundraiser at [facility] with pt instructions.

## 2013-08-10 NOTE — Telephone Encounter (Signed)
Hi Becky Farrell, I think you are handling these now correct?  Becky Farrell came in today for PT/INR check and PT=46.1 and INR=5.59. She has not been sick or had any med changes since last draw. I just confirmed with her that she has been on 7.5mg  Coumadin daily since last draw on 8/11. She will obviously hold her dose tonight but what should she do after that?  Thanks, Purvis Kilts, RN, BSN Independent Living Nurse [facility] [address] [place], [place] [address] Office [phone] Fax [phone] Attn: Morrie Sheldon

## 2013-08-10 NOTE — Telephone Encounter (Signed)
Thank you :)

## 2013-08-13 DIAGNOSIS — Z7901 Long term (current) use of anticoagulants: Secondary | ICD-10-CM | POA: Diagnosis not present

## 2013-08-14 ENCOUNTER — Ambulatory Visit (INDEPENDENT_AMBULATORY_CARE_PROVIDER_SITE_OTHER): Payer: Medicare Other | Admitting: Family Medicine

## 2013-08-14 LAB — POCT INR: INR: 1.5

## 2013-08-17 DIAGNOSIS — D259 Leiomyoma of uterus, unspecified: Secondary | ICD-10-CM | POA: Diagnosis not present

## 2013-08-17 DIAGNOSIS — N950 Postmenopausal bleeding: Secondary | ICD-10-CM | POA: Diagnosis not present

## 2013-08-17 DIAGNOSIS — R9389 Abnormal findings on diagnostic imaging of other specified body structures: Secondary | ICD-10-CM | POA: Diagnosis not present

## 2013-08-18 DIAGNOSIS — N950 Postmenopausal bleeding: Secondary | ICD-10-CM | POA: Diagnosis not present

## 2013-08-18 DIAGNOSIS — N8500 Endometrial hyperplasia, unspecified: Secondary | ICD-10-CM | POA: Diagnosis not present

## 2013-08-23 DIAGNOSIS — Z23 Encounter for immunization: Secondary | ICD-10-CM | POA: Diagnosis not present

## 2013-08-27 DIAGNOSIS — Z7901 Long term (current) use of anticoagulants: Secondary | ICD-10-CM | POA: Diagnosis not present

## 2013-08-28 ENCOUNTER — Ambulatory Visit (INDEPENDENT_AMBULATORY_CARE_PROVIDER_SITE_OTHER): Payer: Medicare Other | Admitting: Family Medicine

## 2013-09-04 DIAGNOSIS — Z7901 Long term (current) use of anticoagulants: Secondary | ICD-10-CM | POA: Diagnosis not present

## 2013-09-04 DIAGNOSIS — S01502A Unspecified open wound of oral cavity, initial encounter: Secondary | ICD-10-CM | POA: Diagnosis not present

## 2013-09-07 ENCOUNTER — Ambulatory Visit (INDEPENDENT_AMBULATORY_CARE_PROVIDER_SITE_OTHER): Payer: Medicare Other | Admitting: Family Medicine

## 2013-09-07 DIAGNOSIS — Z7901 Long term (current) use of anticoagulants: Secondary | ICD-10-CM

## 2013-09-17 DIAGNOSIS — Z7901 Long term (current) use of anticoagulants: Secondary | ICD-10-CM | POA: Diagnosis not present

## 2013-09-18 ENCOUNTER — Ambulatory Visit (INDEPENDENT_AMBULATORY_CARE_PROVIDER_SITE_OTHER): Payer: Medicare Other | Admitting: Family Medicine

## 2013-09-18 LAB — POCT INR: INR: 3.7

## 2013-09-21 DIAGNOSIS — R42 Dizziness and giddiness: Secondary | ICD-10-CM | POA: Diagnosis not present

## 2013-09-21 DIAGNOSIS — R439 Unspecified disturbances of smell and taste: Secondary | ICD-10-CM | POA: Diagnosis not present

## 2013-09-21 DIAGNOSIS — G478 Other sleep disorders: Secondary | ICD-10-CM | POA: Diagnosis not present

## 2013-09-21 DIAGNOSIS — R279 Unspecified lack of coordination: Secondary | ICD-10-CM | POA: Diagnosis not present

## 2013-09-22 DIAGNOSIS — N840 Polyp of corpus uteri: Secondary | ICD-10-CM | POA: Diagnosis not present

## 2013-09-22 DIAGNOSIS — R9389 Abnormal findings on diagnostic imaging of other specified body structures: Secondary | ICD-10-CM | POA: Diagnosis not present

## 2013-09-24 ENCOUNTER — Ambulatory Visit: Payer: Medicare Other | Admitting: Family Medicine

## 2013-09-27 ENCOUNTER — Other Ambulatory Visit: Payer: Self-pay | Admitting: Family Medicine

## 2013-10-05 ENCOUNTER — Ambulatory Visit (INDEPENDENT_AMBULATORY_CARE_PROVIDER_SITE_OTHER): Payer: Medicare Other | Admitting: Family Medicine

## 2013-10-05 DIAGNOSIS — H6120 Impacted cerumen, unspecified ear: Secondary | ICD-10-CM | POA: Diagnosis not present

## 2013-10-05 DIAGNOSIS — H7290 Unspecified perforation of tympanic membrane, unspecified ear: Secondary | ICD-10-CM | POA: Diagnosis not present

## 2013-10-05 DIAGNOSIS — Z7901 Long term (current) use of anticoagulants: Secondary | ICD-10-CM | POA: Diagnosis not present

## 2013-10-05 LAB — POCT INR: INR: 4.54

## 2013-10-07 ENCOUNTER — Encounter: Payer: Self-pay | Admitting: Podiatry

## 2013-10-07 ENCOUNTER — Ambulatory Visit (INDEPENDENT_AMBULATORY_CARE_PROVIDER_SITE_OTHER): Payer: Medicare Other | Admitting: Podiatry

## 2013-10-07 VITALS — BP 115/62 | HR 57 | Resp 18 | Ht 66.0 in | Wt 150.0 lb

## 2013-10-07 DIAGNOSIS — M79609 Pain in unspecified limb: Secondary | ICD-10-CM | POA: Diagnosis not present

## 2013-10-07 DIAGNOSIS — B351 Tinea unguium: Secondary | ICD-10-CM

## 2013-10-07 NOTE — Progress Notes (Signed)
Becky Farrell presents today with a chief complaint of painful toenails one through 5 bilateral.  Objective: Vital signs are stable she is alert and oriented x3. Pulses remain palpable bilateral. Nails are thick yellow dystrophic clinically mycotic.  Assessment pain in limb secondary to onychomycosis 1 through 5 bilateral.  Plan: Debridement of nails in thickness and length as a covered service secondary to pain followup with [place] in 3 months.

## 2013-10-26 DIAGNOSIS — Z7901 Long term (current) use of anticoagulants: Secondary | ICD-10-CM | POA: Diagnosis not present

## 2013-10-27 ENCOUNTER — Ambulatory Visit (INDEPENDENT_AMBULATORY_CARE_PROVIDER_SITE_OTHER): Payer: Medicare Other | Admitting: Family Medicine

## 2013-10-31 ENCOUNTER — Other Ambulatory Visit: Payer: Self-pay | Admitting: Family Medicine

## 2013-11-03 DIAGNOSIS — N814 Uterovaginal prolapse, unspecified: Secondary | ICD-10-CM | POA: Diagnosis not present

## 2013-11-16 ENCOUNTER — Ambulatory Visit (INDEPENDENT_AMBULATORY_CARE_PROVIDER_SITE_OTHER): Payer: Medicare Other | Admitting: Family Medicine

## 2013-11-16 DIAGNOSIS — Z7901 Long term (current) use of anticoagulants: Secondary | ICD-10-CM | POA: Diagnosis not present

## 2013-12-14 ENCOUNTER — Ambulatory Visit: Payer: Self-pay | Admitting: Family Medicine

## 2013-12-14 DIAGNOSIS — Z7901 Long term (current) use of anticoagulants: Secondary | ICD-10-CM | POA: Diagnosis not present

## 2013-12-15 ENCOUNTER — Ambulatory Visit: Payer: Self-pay | Admitting: Family Medicine

## 2013-12-15 ENCOUNTER — Ambulatory Visit (INDEPENDENT_AMBULATORY_CARE_PROVIDER_SITE_OTHER): Payer: Medicare Other | Admitting: Family Medicine

## 2013-12-15 LAB — POCT INR: INR: 2.79

## 2013-12-18 NOTE — Patient Instructions (Signed)
Error

## 2013-12-23 ENCOUNTER — Other Ambulatory Visit: Payer: Self-pay | Admitting: Family Medicine

## 2013-12-24 NOTE — Telephone Encounter (Signed)
Pt requesting medication refill. Last ov 01/2013 with no future appt scheduled. pls advise

## 2014-01-01 ENCOUNTER — Encounter: Payer: Self-pay | Admitting: Family Medicine

## 2014-01-01 ENCOUNTER — Telehealth: Payer: Self-pay | Admitting: Radiology

## 2014-01-01 ENCOUNTER — Ambulatory Visit (INDEPENDENT_AMBULATORY_CARE_PROVIDER_SITE_OTHER): Payer: Medicare Other | Admitting: Family Medicine

## 2014-01-01 VITALS — BP 128/62 | HR 75 | Temp 97.9°F | Ht 65.0 in | Wt 154.0 lb

## 2014-01-01 DIAGNOSIS — E871 Hypo-osmolality and hyponatremia: Secondary | ICD-10-CM | POA: Diagnosis not present

## 2014-01-01 DIAGNOSIS — I10 Essential (primary) hypertension: Secondary | ICD-10-CM

## 2014-01-01 DIAGNOSIS — M8580 Other specified disorders of bone density and structure, unspecified site: Secondary | ICD-10-CM

## 2014-01-01 DIAGNOSIS — Z136 Encounter for screening for cardiovascular disorders: Secondary | ICD-10-CM

## 2014-01-01 DIAGNOSIS — Z1231 Encounter for screening mammogram for malignant neoplasm of breast: Secondary | ICD-10-CM

## 2014-01-01 DIAGNOSIS — M899 Disorder of bone, unspecified: Secondary | ICD-10-CM

## 2014-01-01 DIAGNOSIS — M949 Disorder of cartilage, unspecified: Secondary | ICD-10-CM

## 2014-01-01 NOTE — Assessment & Plan Note (Signed)
Congratulated her on increased weight bearing exercises.

## 2014-01-01 NOTE — Telephone Encounter (Signed)
Noted! Thank you

## 2014-01-01 NOTE — Assessment & Plan Note (Signed)
Well controlled. No changes. 

## 2014-01-01 NOTE — Progress Notes (Signed)
Subjective:    Patient ID: Becky Farrell, female    DOB: 18-Nov-1934, 80 y.o.   MRN: [id number]  HPI  Very pleasant 80 yo female here for follow up.  She has been doing well.  Exercising 5 days per week.   Also was weaned off of Tegretol by Dr. Melrose Nakayama.  HTN- on lisinopril 20 mg daily and HCTZ 12.5 mg daily. Denies any CP, SOB or LE edema.  Lab Results  Component Value Date   NA 133* 12/04/2012   K 3.8 12/04/2012   CL 97 12/04/2012   CO2 29 12/04/2012     Patient Active Problem List   Diagnosis Date Noted  . Osteopenia 01/29/2013  . Hyponatremia 12/04/2012  . Pessary maintenance 05/13/2012  . Uterine prolapse 05/13/2012  . History of melanoma 05/13/2012  . Hypertension   . Seizures   . History of pulmonary embolism    Past Medical History  Diagnosis Date  . Hypertension   . Seizures   . History of pulmonary embolism   . Melanoma 2003  . Colon polyps    Past Surgical History  Procedure Laterality Date  . Left hand surgery  05/2011    s/p fall   History  Substance Use Topics  . Smoking status: Never Smoker   . Smokeless tobacco: Not on file  . Alcohol Use: Not on file   Family History  Problem Relation Age of Onset  . Heart disease Mother    Allergies  Allergen Reactions  . Clobetasol     vertigo  . Phenergan [Promethazine Hcl]     ? Rapid heart rate  . Zithromax [Azithromycin]     ? Rapid heart rate   Current Outpatient Prescriptions on File Prior to Visit  Medication Sig Dispense Refill  . aspirin 81 MG tablet Take 81 mg by mouth daily.      Marland Kitchen atenolol (TENORMIN) 25 MG tablet TAKE 1 TABLET BY MOUTH EVERY DAY  90 tablet  2  . cholecalciferol (VITAMIN D) 1000 UNITS tablet Take 1 tablet (1,000 Units total) by mouth daily.      . Cholecalciferol (VITAMIN D3) 2000 UNITS TABS Take one by mouth daily      . conjugated estrogens (PREMARIN) vaginal cream Place 0.5 g vaginally once a week.      . hydrochlorothiazide (MICROZIDE) 12.5 MG capsule TAKE 1 TABLET (12.5  MG TOTAL) BY MOUTH DAILY.  30 capsule  5  . hydrochlorothiazide (MICROZIDE) 12.5 MG capsule TAKE ONE CAPSULE BY MOUTH EVERY DAY  30 capsule  3  . lisinopril (PRINIVIL,ZESTRIL) 20 MG tablet TAKE 1 TABLET BY MOUTH EVERY DAY  90 tablet  2  . Misc. Devices (CUBE PESSARY) MISC by Does not apply route.      . TEGRETOL 200 MG tablet TAKE 1 TABLET BY MOUTH 4 TIMES A DAY  360 tablet  0  . warfarin (COUMADIN) 10 MG tablet Take as directed by [facility]madin Clinic.  35 tablet  2  . warfarin (COUMADIN) 7.5 MG tablet TAKE 1 TABLET (7.5 MG TOTAL) BY MOUTH DAILY.  30 tablet  2   No current facility-administered medications on file prior to visit.   The PMH, PSH, Social History, Family History, Medications, and allergies have been reviewed in [facility], and have been updated if relevant.   Review of Systems See HPI    Objective:   Physical Exam BP 128/62  Pulse 75  Temp(Src) 97.9 F (36.6 C) (Oral)  Ht 5\' 5"  (1.651 m)  Wt 154  lb (69.854 kg)  BMI 25.63 kg/m2  SpO2 95%  General:  Well-developed,well-nourished,in no acute distress; alert,appropriate and cooperative throughout examination Head:  normocephalic and atraumatic.   Eyes:  vision grossly intact, pupils equal, pupils round, and pupils reactive to light.   Ears:  R ear normal and L ear normal.   Nose:  no external deformity.   Mouth:  good dentition.   Lungs:  Normal respiratory effort, chest expands symmetrically. Lungs are clear to auscultation, no crackles or wheezes. Heart:  Normal rate and regular rhythm. S1 and S2 normal without gallop, murmur, click, rub or other extra sounds. Abdomen:  Bowel sounds positive,abdomen soft and non-tender without masses, organomegaly or hernias noted. Neurologic:  alert & oriented X3 and gait normal.   Psych:  Cognition and judgment appear intact. Alert and cooperative with normal attention span and concentration. No apparent delusions, illusions, hallucinations      Assessment & Plan:

## 2014-01-01 NOTE — Progress Notes (Signed)
Pre-visit discussion using our clinic review tool. No additional management support is needed unless otherwise documented below in the visit note.  

## 2014-01-01 NOTE — Patient Instructions (Signed)
Please set up your mammogram after 01/13/2014. I have already placed order so you just need to call.

## 2014-01-01 NOTE — Assessment & Plan Note (Signed)
Recheck lytes.

## 2014-01-01 NOTE — Telephone Encounter (Signed)
Patient wanted you to be aware that in Oct,2014, she cut the underside of her tongue with a cracker, which required stiches. Patient was treated at, [facility], [place], [place]

## 2014-01-02 LAB — COMPREHENSIVE METABOLIC PANEL
ALT: 31 U/L (ref 0–35)
AST: 29 U/L (ref 0–37)
Albumin: 3.8 g/dL (ref 3.5–5.2)
Alkaline Phosphatase: 78 U/L (ref 39–117)
BILIRUBIN TOTAL: 0.5 mg/dL (ref 0.2–1.2)
BUN: 23 mg/dL (ref 6–23)
CALCIUM: 9.7 mg/dL (ref 8.4–10.5)
CHLORIDE: 101 meq/L (ref 96–112)
CO2: 32 meq/L (ref 19–32)
Creat: 0.74 mg/dL (ref 0.50–1.10)
GLUCOSE: 78 mg/dL (ref 70–99)
Potassium: 3.5 mEq/L (ref 3.5–5.3)
Sodium: 140 mEq/L (ref 135–145)
Total Protein: 6.5 g/dL (ref 6.0–8.3)

## 2014-01-02 LAB — LIPID PANEL
Cholesterol: 211 mg/dL — ABNORMAL HIGH (ref 0–200)
HDL: 70 mg/dL (ref >39)
LDL Cholesterol: 118 mg/dL — ABNORMAL HIGH (ref 0–99)
TRIGLYCERIDES: 114 mg/dL (ref <150)
Total CHOL/HDL Ratio: 3 Ratio
VLDL: 23 mg/dL (ref 0–40)

## 2014-01-04 ENCOUNTER — Encounter: Payer: Self-pay | Admitting: *Deleted

## 2014-01-06 ENCOUNTER — Ambulatory Visit (INDEPENDENT_AMBULATORY_CARE_PROVIDER_SITE_OTHER): Payer: Medicare Other | Admitting: Podiatry

## 2014-01-06 ENCOUNTER — Encounter: Payer: Self-pay | Admitting: Podiatry

## 2014-01-06 VITALS — BP 111/66 | HR 62 | Resp 16

## 2014-01-06 DIAGNOSIS — B351 Tinea unguium: Secondary | ICD-10-CM | POA: Diagnosis not present

## 2014-01-06 DIAGNOSIS — M79609 Pain in unspecified limb: Secondary | ICD-10-CM | POA: Diagnosis not present

## 2014-01-06 NOTE — Progress Notes (Signed)
Trim nails  Because they're painful.  Objective: Vital signs are stable she is alert and oriented x3. Nails are thick yellow dystrophic onychomycotic and painful palpation.  Assessment: Pain in limb secondary to onychomycosis 1 through 5 bilateral.  Plan: Debridement of nails 1 through 5 bilateral.

## 2014-01-08 DIAGNOSIS — H1130 Conjunctival hemorrhage, unspecified eye: Secondary | ICD-10-CM | POA: Diagnosis not present

## 2014-01-14 ENCOUNTER — Ambulatory Visit (INDEPENDENT_AMBULATORY_CARE_PROVIDER_SITE_OTHER): Payer: Medicare Other | Admitting: Family Medicine

## 2014-01-14 DIAGNOSIS — Z7901 Long term (current) use of anticoagulants: Secondary | ICD-10-CM

## 2014-01-14 DIAGNOSIS — Z5181 Encounter for therapeutic drug level monitoring: Secondary | ICD-10-CM

## 2014-01-14 LAB — POCT INR: INR: 2.8

## 2014-01-21 DIAGNOSIS — K649 Unspecified hemorrhoids: Secondary | ICD-10-CM | POA: Diagnosis not present

## 2014-01-21 DIAGNOSIS — N840 Polyp of corpus uteri: Secondary | ICD-10-CM | POA: Diagnosis not present

## 2014-01-30 ENCOUNTER — Other Ambulatory Visit: Payer: Self-pay | Admitting: Family Medicine

## 2014-02-08 DIAGNOSIS — Z7901 Long term (current) use of anticoagulants: Secondary | ICD-10-CM | POA: Diagnosis not present

## 2014-02-09 ENCOUNTER — Ambulatory Visit (INDEPENDENT_AMBULATORY_CARE_PROVIDER_SITE_OTHER): Payer: Medicare Other | Admitting: Family Medicine

## 2014-02-09 DIAGNOSIS — Z5181 Encounter for therapeutic drug level monitoring: Secondary | ICD-10-CM

## 2014-02-09 LAB — POCT INR: INR: 2.23

## 2014-02-16 DIAGNOSIS — H2510 Age-related nuclear cataract, unspecified eye: Secondary | ICD-10-CM | POA: Diagnosis not present

## 2014-02-22 ENCOUNTER — Other Ambulatory Visit: Payer: Self-pay | Admitting: Family Medicine

## 2014-03-15 ENCOUNTER — Ambulatory Visit (INDEPENDENT_AMBULATORY_CARE_PROVIDER_SITE_OTHER): Payer: Medicare Other | Admitting: Family Medicine

## 2014-03-15 DIAGNOSIS — N812 Incomplete uterovaginal prolapse: Secondary | ICD-10-CM | POA: Diagnosis not present

## 2014-03-15 DIAGNOSIS — Z5181 Encounter for therapeutic drug level monitoring: Secondary | ICD-10-CM

## 2014-03-15 DIAGNOSIS — Z7901 Long term (current) use of anticoagulants: Secondary | ICD-10-CM | POA: Diagnosis not present

## 2014-03-15 LAB — POCT INR: INR: 2.47

## 2014-03-21 ENCOUNTER — Other Ambulatory Visit: Payer: Self-pay | Admitting: Family Medicine

## 2014-03-27 ENCOUNTER — Other Ambulatory Visit: Payer: Self-pay | Admitting: Family Medicine

## 2014-04-03 ENCOUNTER — Other Ambulatory Visit: Payer: Self-pay | Admitting: Family Medicine

## 2014-04-07 ENCOUNTER — Ambulatory Visit: Payer: Self-pay | Admitting: Family Medicine

## 2014-04-07 ENCOUNTER — Encounter: Payer: Self-pay | Admitting: Family Medicine

## 2014-04-07 ENCOUNTER — Ambulatory Visit: Payer: Medicare Other | Admitting: Podiatry

## 2014-04-07 DIAGNOSIS — N812 Incomplete uterovaginal prolapse: Secondary | ICD-10-CM | POA: Diagnosis not present

## 2014-04-07 DIAGNOSIS — Z1231 Encounter for screening mammogram for malignant neoplasm of breast: Secondary | ICD-10-CM | POA: Diagnosis not present

## 2014-04-12 DIAGNOSIS — H6120 Impacted cerumen, unspecified ear: Secondary | ICD-10-CM | POA: Diagnosis not present

## 2014-04-12 DIAGNOSIS — H7290 Unspecified perforation of tympanic membrane, unspecified ear: Secondary | ICD-10-CM | POA: Diagnosis not present

## 2014-04-12 DIAGNOSIS — J301 Allergic rhinitis due to pollen: Secondary | ICD-10-CM | POA: Diagnosis not present

## 2014-04-15 ENCOUNTER — Ambulatory Visit (INDEPENDENT_AMBULATORY_CARE_PROVIDER_SITE_OTHER): Payer: Medicare Other | Admitting: Family Medicine

## 2014-04-15 DIAGNOSIS — Z5181 Encounter for therapeutic drug level monitoring: Secondary | ICD-10-CM

## 2014-04-15 DIAGNOSIS — Z7901 Long term (current) use of anticoagulants: Secondary | ICD-10-CM | POA: Diagnosis not present

## 2014-04-15 LAB — POCT INR: INR: 2.68

## 2014-05-03 HISTORY — PX: ABDOMINAL HYSTERECTOMY: SHX81

## 2014-05-05 ENCOUNTER — Ambulatory Visit: Payer: Self-pay | Admitting: Obstetrics and Gynecology

## 2014-05-05 ENCOUNTER — Telehealth: Payer: Self-pay | Admitting: Family Medicine

## 2014-05-05 DIAGNOSIS — N812 Incomplete uterovaginal prolapse: Secondary | ICD-10-CM | POA: Diagnosis not present

## 2014-05-05 DIAGNOSIS — I10 Essential (primary) hypertension: Secondary | ICD-10-CM | POA: Diagnosis not present

## 2014-05-05 DIAGNOSIS — N840 Polyp of corpus uteri: Secondary | ICD-10-CM | POA: Diagnosis not present

## 2014-05-05 DIAGNOSIS — N8111 Cystocele, midline: Secondary | ICD-10-CM | POA: Diagnosis not present

## 2014-05-05 DIAGNOSIS — Z86711 Personal history of pulmonary embolism: Secondary | ICD-10-CM | POA: Diagnosis not present

## 2014-05-05 DIAGNOSIS — Z79899 Other long term (current) drug therapy: Secondary | ICD-10-CM | POA: Diagnosis not present

## 2014-05-05 DIAGNOSIS — Z01812 Encounter for preprocedural laboratory examination: Secondary | ICD-10-CM | POA: Diagnosis not present

## 2014-05-05 LAB — CBC
HCT: 40.8 % (ref 35.0–47.0)
HGB: 13.8 g/dL (ref 12.0–16.0)
MCH: 32.7 pg (ref 26.0–34.0)
MCHC: 33.7 g/dL (ref 32.0–36.0)
MCV: 97 fL (ref 80–100)
PLATELETS: 163 10*3/uL (ref 150–440)
RBC: 4.21 10*6/uL (ref 3.80–5.20)
RDW: 13.6 % (ref 11.5–14.5)
WBC: 4.8 10*3/uL (ref 3.6–11.0)

## 2014-05-05 LAB — PROTIME-INR
INR: 2.5
Prothrombin Time: 26.6 secs — ABNORMAL HIGH (ref 11.5–14.7)

## 2014-05-05 NOTE — Telephone Encounter (Signed)
Pt left vm requesting call back to confirm her Coumadin dosage.  Left detailed message giving her Coumadin dosage on file and to call back with any questions.

## 2014-05-10 ENCOUNTER — Ambulatory Visit: Payer: Self-pay | Admitting: Obstetrics and Gynecology

## 2014-05-10 DIAGNOSIS — I10 Essential (primary) hypertension: Secondary | ICD-10-CM | POA: Diagnosis not present

## 2014-05-10 DIAGNOSIS — N812 Incomplete uterovaginal prolapse: Secondary | ICD-10-CM | POA: Diagnosis not present

## 2014-05-10 DIAGNOSIS — R42 Dizziness and giddiness: Secondary | ICD-10-CM | POA: Diagnosis not present

## 2014-05-10 DIAGNOSIS — Z01812 Encounter for preprocedural laboratory examination: Secondary | ICD-10-CM | POA: Diagnosis not present

## 2014-05-10 DIAGNOSIS — Z79899 Other long term (current) drug therapy: Secondary | ICD-10-CM | POA: Diagnosis not present

## 2014-05-10 DIAGNOSIS — N8111 Cystocele, midline: Secondary | ICD-10-CM | POA: Diagnosis not present

## 2014-05-10 DIAGNOSIS — N72 Inflammatory disease of cervix uteri: Secondary | ICD-10-CM | POA: Diagnosis not present

## 2014-05-10 DIAGNOSIS — R109 Unspecified abdominal pain: Secondary | ICD-10-CM | POA: Diagnosis not present

## 2014-05-10 LAB — PROTIME-INR
INR: 1.9
Prothrombin Time: 21.5 secs — ABNORMAL HIGH (ref 11.5–14.7)

## 2014-05-11 ENCOUNTER — Observation Stay: Payer: Self-pay | Admitting: Obstetrics and Gynecology

## 2014-05-11 DIAGNOSIS — Z881 Allergy status to other antibiotic agents status: Secondary | ICD-10-CM | POA: Diagnosis not present

## 2014-05-11 DIAGNOSIS — N838 Other noninflammatory disorders of ovary, fallopian tube and broad ligament: Secondary | ICD-10-CM | POA: Diagnosis not present

## 2014-05-11 DIAGNOSIS — R42 Dizziness and giddiness: Secondary | ICD-10-CM | POA: Diagnosis not present

## 2014-05-11 DIAGNOSIS — N812 Incomplete uterovaginal prolapse: Secondary | ICD-10-CM | POA: Diagnosis not present

## 2014-05-11 DIAGNOSIS — Z87440 Personal history of urinary (tract) infections: Secondary | ICD-10-CM | POA: Diagnosis not present

## 2014-05-11 DIAGNOSIS — Z780 Asymptomatic menopausal state: Secondary | ICD-10-CM | POA: Diagnosis not present

## 2014-05-11 DIAGNOSIS — Z85820 Personal history of malignant melanoma of skin: Secondary | ICD-10-CM | POA: Diagnosis not present

## 2014-05-11 DIAGNOSIS — N840 Polyp of corpus uteri: Secondary | ICD-10-CM | POA: Diagnosis not present

## 2014-05-11 DIAGNOSIS — Z86711 Personal history of pulmonary embolism: Secondary | ICD-10-CM | POA: Diagnosis not present

## 2014-05-11 DIAGNOSIS — D259 Leiomyoma of uterus, unspecified: Secondary | ICD-10-CM | POA: Diagnosis not present

## 2014-05-11 DIAGNOSIS — Z7901 Long term (current) use of anticoagulants: Secondary | ICD-10-CM | POA: Diagnosis not present

## 2014-05-11 DIAGNOSIS — N8111 Cystocele, midline: Secondary | ICD-10-CM | POA: Diagnosis not present

## 2014-05-11 DIAGNOSIS — I10 Essential (primary) hypertension: Secondary | ICD-10-CM | POA: Diagnosis not present

## 2014-05-11 DIAGNOSIS — Z7982 Long term (current) use of aspirin: Secondary | ICD-10-CM | POA: Diagnosis not present

## 2014-05-11 DIAGNOSIS — H409 Unspecified glaucoma: Secondary | ICD-10-CM | POA: Diagnosis not present

## 2014-05-11 DIAGNOSIS — N72 Inflammatory disease of cervix uteri: Secondary | ICD-10-CM | POA: Diagnosis not present

## 2014-05-11 DIAGNOSIS — Z79899 Other long term (current) drug therapy: Secondary | ICD-10-CM | POA: Diagnosis not present

## 2014-05-11 DIAGNOSIS — Z888 Allergy status to other drugs, medicaments and biological substances status: Secondary | ICD-10-CM | POA: Diagnosis not present

## 2014-05-12 DIAGNOSIS — N812 Incomplete uterovaginal prolapse: Secondary | ICD-10-CM | POA: Diagnosis not present

## 2014-05-12 DIAGNOSIS — N8111 Cystocele, midline: Secondary | ICD-10-CM | POA: Diagnosis not present

## 2014-05-12 DIAGNOSIS — N840 Polyp of corpus uteri: Secondary | ICD-10-CM | POA: Diagnosis not present

## 2014-05-12 DIAGNOSIS — N838 Other noninflammatory disorders of ovary, fallopian tube and broad ligament: Secondary | ICD-10-CM | POA: Diagnosis not present

## 2014-05-12 DIAGNOSIS — N72 Inflammatory disease of cervix uteri: Secondary | ICD-10-CM | POA: Diagnosis not present

## 2014-05-12 DIAGNOSIS — Z86711 Personal history of pulmonary embolism: Secondary | ICD-10-CM | POA: Diagnosis not present

## 2014-05-12 LAB — HEMATOCRIT: HCT: 36.4 % (ref 35.0–47.0)

## 2014-05-13 DIAGNOSIS — R300 Dysuria: Secondary | ICD-10-CM | POA: Diagnosis not present

## 2014-05-13 DIAGNOSIS — N390 Urinary tract infection, site not specified: Secondary | ICD-10-CM | POA: Diagnosis not present

## 2014-05-13 LAB — PATHOLOGY REPORT

## 2014-05-17 DIAGNOSIS — H40009 Preglaucoma, unspecified, unspecified eye: Secondary | ICD-10-CM | POA: Diagnosis not present

## 2014-05-20 ENCOUNTER — Ambulatory Visit (INDEPENDENT_AMBULATORY_CARE_PROVIDER_SITE_OTHER): Payer: Medicare Other | Admitting: Family Medicine

## 2014-05-20 DIAGNOSIS — Z7901 Long term (current) use of anticoagulants: Secondary | ICD-10-CM | POA: Diagnosis not present

## 2014-05-20 DIAGNOSIS — Z5181 Encounter for therapeutic drug level monitoring: Secondary | ICD-10-CM

## 2014-05-20 LAB — POCT INR: INR: 1.61

## 2014-06-16 ENCOUNTER — Encounter: Payer: Self-pay | Admitting: Podiatry

## 2014-06-16 ENCOUNTER — Ambulatory Visit (INDEPENDENT_AMBULATORY_CARE_PROVIDER_SITE_OTHER): Payer: Medicare Other | Admitting: Podiatry

## 2014-06-16 DIAGNOSIS — M79609 Pain in unspecified limb: Secondary | ICD-10-CM

## 2014-06-16 DIAGNOSIS — B351 Tinea unguium: Secondary | ICD-10-CM | POA: Diagnosis not present

## 2014-06-16 DIAGNOSIS — M79676 Pain in unspecified toe(s): Secondary | ICD-10-CM

## 2014-06-16 NOTE — Progress Notes (Signed)
She presents today chief complaint of painful elongated toenails one through 5 bilateral.  Objective: Nails are thick yellow dystrophic with mycotic and painful palpation.  Assessment: Pain in limb secondary to onychomycosis 1 through 5 bilateral.  Plan: Debridement of nails 1 through 5 bilateral covered service secondary to pain.

## 2014-06-17 ENCOUNTER — Ambulatory Visit (INDEPENDENT_AMBULATORY_CARE_PROVIDER_SITE_OTHER): Payer: Medicare Other | Admitting: Family Medicine

## 2014-06-17 DIAGNOSIS — Z7901 Long term (current) use of anticoagulants: Secondary | ICD-10-CM | POA: Diagnosis not present

## 2014-06-17 DIAGNOSIS — Z5181 Encounter for therapeutic drug level monitoring: Secondary | ICD-10-CM

## 2014-06-17 LAB — POCT INR: INR: 2.25

## 2014-06-30 ENCOUNTER — Other Ambulatory Visit: Payer: Self-pay | Admitting: Family Medicine

## 2014-07-15 DIAGNOSIS — Z7901 Long term (current) use of anticoagulants: Secondary | ICD-10-CM | POA: Diagnosis not present

## 2014-07-19 ENCOUNTER — Ambulatory Visit (INDEPENDENT_AMBULATORY_CARE_PROVIDER_SITE_OTHER): Payer: Medicare Other | Admitting: Family Medicine

## 2014-07-19 DIAGNOSIS — Z5181 Encounter for therapeutic drug level monitoring: Secondary | ICD-10-CM

## 2014-07-19 LAB — POCT INR: INR: 2.69

## 2014-07-30 DIAGNOSIS — L578 Other skin changes due to chronic exposure to nonionizing radiation: Secondary | ICD-10-CM | POA: Diagnosis not present

## 2014-07-30 DIAGNOSIS — Z1283 Encounter for screening for malignant neoplasm of skin: Secondary | ICD-10-CM | POA: Diagnosis not present

## 2014-07-30 DIAGNOSIS — D1800 Hemangioma unspecified site: Secondary | ICD-10-CM | POA: Diagnosis not present

## 2014-07-30 DIAGNOSIS — L570 Actinic keratosis: Secondary | ICD-10-CM | POA: Diagnosis not present

## 2014-07-30 DIAGNOSIS — Z85820 Personal history of malignant melanoma of skin: Secondary | ICD-10-CM | POA: Diagnosis not present

## 2014-07-30 DIAGNOSIS — L819 Disorder of pigmentation, unspecified: Secondary | ICD-10-CM | POA: Diagnosis not present

## 2014-07-30 DIAGNOSIS — L820 Inflamed seborrheic keratosis: Secondary | ICD-10-CM | POA: Diagnosis not present

## 2014-07-30 DIAGNOSIS — D239 Other benign neoplasm of skin, unspecified: Secondary | ICD-10-CM | POA: Diagnosis not present

## 2014-08-01 ENCOUNTER — Other Ambulatory Visit: Payer: Self-pay | Admitting: Family Medicine

## 2014-08-06 ENCOUNTER — Ambulatory Visit (INDEPENDENT_AMBULATORY_CARE_PROVIDER_SITE_OTHER): Payer: Medicare Other | Admitting: Family Medicine

## 2014-08-06 ENCOUNTER — Encounter: Payer: Self-pay | Admitting: Family Medicine

## 2014-08-06 VITALS — BP 108/64 | HR 63 | Temp 97.6°F | Wt 156.5 lb

## 2014-08-06 DIAGNOSIS — R42 Dizziness and giddiness: Secondary | ICD-10-CM | POA: Diagnosis not present

## 2014-08-06 DIAGNOSIS — Z23 Encounter for immunization: Secondary | ICD-10-CM | POA: Diagnosis not present

## 2014-08-06 DIAGNOSIS — Z86711 Personal history of pulmonary embolism: Secondary | ICD-10-CM | POA: Diagnosis not present

## 2014-08-06 DIAGNOSIS — I10 Essential (primary) hypertension: Secondary | ICD-10-CM | POA: Diagnosis not present

## 2014-08-06 MED ORDER — LISINOPRIL 10 MG PO TABS: ORAL_TABLET | ORAL | Status: DC

## 2014-08-06 NOTE — Assessment & Plan Note (Signed)
New- intermittent hypotension. Will decrease dose of lisionpril.  If symptoms persist, will proceed with TIA work up. Exam and history otherwise reassuring and she is on chronic anticoagulation.

## 2014-08-06 NOTE — Progress Notes (Signed)
Pre visit review using our clinic review tool, if applicable. No additional management support is needed unless otherwise documented below in the visit note. 
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
## 2014-08-06 NOTE — Assessment & Plan Note (Signed)
Now with some possible symptomatic hypotension. Will decrease lisinopril dose to 10 mg daily. Continue other rx at current doses. Contacted Fransico Him at [facility].  She will check her BP on 9/14 and call me with the results.

## 2014-08-06 NOTE — Patient Instructions (Signed)
Good to see you. Please decrease your lisinopril dose to 10 mg daily- please ask Caryl Pina to check your blood pressure on 9/14.

## 2014-08-06 NOTE — Assessment & Plan Note (Signed)
On coumadin 

## 2014-08-12 ENCOUNTER — Ambulatory Visit (INDEPENDENT_AMBULATORY_CARE_PROVIDER_SITE_OTHER): Payer: Medicare Other | Admitting: Family Medicine

## 2014-08-12 DIAGNOSIS — Z7901 Long term (current) use of anticoagulants: Secondary | ICD-10-CM | POA: Diagnosis not present

## 2014-08-12 DIAGNOSIS — Z5181 Encounter for therapeutic drug level monitoring: Secondary | ICD-10-CM

## 2014-08-12 LAB — POCT INR: INR: 2.43

## 2014-08-17 ENCOUNTER — Ambulatory Visit (INDEPENDENT_AMBULATORY_CARE_PROVIDER_SITE_OTHER): Payer: Medicare Other | Admitting: Podiatry

## 2014-08-17 DIAGNOSIS — B351 Tinea unguium: Secondary | ICD-10-CM | POA: Diagnosis not present

## 2014-08-17 DIAGNOSIS — M79609 Pain in unspecified limb: Secondary | ICD-10-CM

## 2014-08-17 DIAGNOSIS — M79676 Pain in unspecified toe(s): Secondary | ICD-10-CM

## 2014-08-17 NOTE — Progress Notes (Signed)
Patient ID: Becky Farrell, female   DOB: [birth year], 80 y.o.   MRN: [id number]  Subjective: Becky Farrell, 80 year old female returns the office today with complaints of bilateral third digit consistent digit nail along patient in pain. She states the nails have started to crack and had become painful. Denies any drainage or redness from around the area. No other complaints at this time. No recent injury.  Objective: AAO x3, NAD DP/PT pulses palpable b/l. CRT < 3sec Protective sensation intact with Derrel Nip monofilament, vibratory sensation intact, Achilles tendon reflex intact. Bilateral third digit and left second digit nails elongated, hypertrophic, brittle. Second digit on the left nail with mild splitting of the edges. No surrounding erythema or drainage. No open lesions or pre-ulcerative lesions. No leg pain, swelling, warmth.  Assessment: 80 year old female presents for painful second and bilateral third digit elongated nails, onychomycosis.  Plan: -Treatment options discussed including alternatives, risks, complications. -Nails sharply debrided x3 without complications to patient comfort. -Discussed daily foot inspection. -Followup as scheduled for her regularly scheduled 3 month appointment. In the meantime call any questions or concerns or change in symptoms.

## 2014-09-09 ENCOUNTER — Encounter: Payer: Self-pay | Admitting: Family Medicine

## 2014-09-09 DIAGNOSIS — Z7901 Long term (current) use of anticoagulants: Secondary | ICD-10-CM | POA: Diagnosis not present

## 2014-09-09 LAB — PROTIME-INR

## 2014-09-15 ENCOUNTER — Ambulatory Visit: Payer: Medicare Other | Admitting: Podiatry

## 2014-09-21 DIAGNOSIS — R270 Ataxia, unspecified: Secondary | ICD-10-CM | POA: Diagnosis not present

## 2014-09-21 DIAGNOSIS — G40909 Epilepsy, unspecified, not intractable, without status epilepticus: Secondary | ICD-10-CM | POA: Diagnosis not present

## 2014-09-21 DIAGNOSIS — R200 Anesthesia of skin: Secondary | ICD-10-CM | POA: Diagnosis not present

## 2014-09-21 DIAGNOSIS — I10 Essential (primary) hypertension: Secondary | ICD-10-CM | POA: Diagnosis not present

## 2014-10-07 DIAGNOSIS — Z7901 Long term (current) use of anticoagulants: Secondary | ICD-10-CM | POA: Diagnosis not present

## 2014-10-07 LAB — PROTIME-INR

## 2014-10-08 ENCOUNTER — Encounter: Payer: Self-pay | Admitting: Family Medicine

## 2014-10-14 ENCOUNTER — Telehealth: Payer: Self-pay | Admitting: Family Medicine

## 2014-10-14 ENCOUNTER — Telehealth: Payer: Self-pay

## 2014-10-14 DIAGNOSIS — Z7901 Long term (current) use of anticoagulants: Secondary | ICD-10-CM | POA: Diagnosis not present

## 2014-10-14 NOTE — Telephone Encounter (Signed)
Lm on pts vm requesting a call back 

## 2014-10-14 NOTE — Telephone Encounter (Signed)
We would need to recheck her BP to know if we can decrease metoprolol as well.

## 2014-10-14 NOTE — Telephone Encounter (Signed)
Pt left v/m; 08/06/14 lisinopril was decreased to 10 mg daily; pt has continued to take atenolol 25 mg daily. It is time for pt to refill atenolol and wants to know if Dr Deborra Medina intends to change atenolol dosage. Pt request cb. [facility].

## 2014-10-14 NOTE — Telephone Encounter (Signed)
Katie nurse at independent living [facility] left v/m calling pt PT and INR results; PT 21.2 and INR 1.82; Katie had faxed results to office and Katie request cb at [phone] with any changes to coumadin and when pt needs recheck of PT/INR. Dr Deborra Medina out of office until 10/18/14.Please advise.

## 2014-10-15 NOTE — Telephone Encounter (Signed)
Becky Farrell was off, left message with Sharee Pimple

## 2014-10-15 NOTE — Telephone Encounter (Signed)
Have her take 7.5mg  daily for the next 3 days--then go back to 7.5 alternating with 3.75mg  Recheck protime in 1 month

## 2014-10-18 NOTE — Telephone Encounter (Signed)
Lm on pts vm requesting a call back 

## 2014-10-19 NOTE — Telephone Encounter (Signed)
Pt left v/m returning call and request cb today.Please advise.

## 2014-10-19 NOTE — Telephone Encounter (Signed)
Patient advised.

## 2014-11-15 ENCOUNTER — Ambulatory Visit (INDEPENDENT_AMBULATORY_CARE_PROVIDER_SITE_OTHER): Payer: Medicare Other | Admitting: Podiatry

## 2014-11-15 ENCOUNTER — Ambulatory Visit: Payer: Medicare Other | Admitting: Podiatry

## 2014-11-15 ENCOUNTER — Other Ambulatory Visit: Payer: Self-pay | Admitting: *Deleted

## 2014-11-15 DIAGNOSIS — M79676 Pain in unspecified toe(s): Secondary | ICD-10-CM | POA: Diagnosis not present

## 2014-11-15 DIAGNOSIS — Z7901 Long term (current) use of anticoagulants: Secondary | ICD-10-CM | POA: Diagnosis not present

## 2014-11-15 DIAGNOSIS — B351 Tinea unguium: Secondary | ICD-10-CM | POA: Diagnosis not present

## 2014-11-15 LAB — PROTIME-INR

## 2014-11-15 MED ORDER — ATENOLOL 25 MG PO TABS: 25.0000 mg | ORAL_TABLET | Freq: Every day | ORAL | Status: DC

## 2014-11-15 NOTE — Progress Notes (Signed)
She presents today chief complaint of painful elongated toenails one through 5 bilateral.  Objective: Nails are thick yellow dystrophic with mycotic and painful palpation.  Assessment: Pain in limb secondary to onychomycosis 1 through 5 bilateral.  Plan: Debridement of nails 1 through 5 bilateral covered service secondary to pain.

## 2014-11-16 ENCOUNTER — Telehealth: Payer: Self-pay | Admitting: Family Medicine

## 2014-11-16 ENCOUNTER — Encounter: Payer: Self-pay | Admitting: Family Medicine

## 2014-11-16 NOTE — Telephone Encounter (Signed)
Any changes in diet or recent abx? If so, please document that. Continue current dose, recheck PT/INR in 1 week.

## 2014-11-16 NOTE — Telephone Encounter (Signed)
Lm on pts vm requesting a call back 

## 2014-11-16 NOTE — Telephone Encounter (Signed)
Spoke to [place] and advised per Dr Deborra Medina; verbally expressed understanding. She states she is unaware of requested info regarding diet and abx.

## 2014-11-16 NOTE — Telephone Encounter (Signed)
Becky Farrell [facility] nurse called to inform you of pt's results. PT: 21.1 INR: 1.80  Will there need to be a dosage change and what will pt's recheck date be?  Please advise

## 2014-11-22 ENCOUNTER — Other Ambulatory Visit: Payer: Self-pay

## 2014-11-22 DIAGNOSIS — Z7901 Long term (current) use of anticoagulants: Secondary | ICD-10-CM | POA: Diagnosis not present

## 2014-11-22 LAB — POCT INR: INR: 1.9

## 2014-11-22 MED ORDER — HYDROCHLOROTHIAZIDE 12.5 MG PO CAPS: 12.5000 mg | ORAL_CAPSULE | Freq: Every day | ORAL | Status: DC

## 2014-11-22 NOTE — Telephone Encounter (Signed)
Pt request refill on [facility]; pt said on rx states pt needs appt to see Dr Deborra Medina; pt said nurses at [facility] independent living has been reporting BP to Dr Deborra Medina; pt said last time checked BP was 10/11/14 and BP was 126/80. Pt has been taking HCTZ daily and has 7 capsules left. Pt request cb to see if needs to be seen or can HCTZ be refilled.

## 2014-11-24 ENCOUNTER — Ambulatory Visit (INDEPENDENT_AMBULATORY_CARE_PROVIDER_SITE_OTHER): Payer: Medicare Other | Admitting: Family Medicine

## 2014-11-24 DIAGNOSIS — Z5181 Encounter for therapeutic drug level monitoring: Secondary | ICD-10-CM

## 2014-12-01 ENCOUNTER — Ambulatory Visit (INDEPENDENT_AMBULATORY_CARE_PROVIDER_SITE_OTHER)
Admission: RE | Admit: 2014-12-01 | Discharge: 2014-12-01 | Disposition: A | Discharge status: Home / Self Care | Payer: Medicare Other | Source: Ambulatory Visit | Attending: Internal Medicine | Admitting: Internal Medicine

## 2014-12-01 ENCOUNTER — Telehealth: Payer: Self-pay

## 2014-12-01 ENCOUNTER — Encounter: Payer: Self-pay | Admitting: Internal Medicine

## 2014-12-01 ENCOUNTER — Ambulatory Visit (INDEPENDENT_AMBULATORY_CARE_PROVIDER_SITE_OTHER): Payer: Medicare Other | Admitting: Internal Medicine

## 2014-12-01 VITALS — BP 110/58 | HR 69 | Temp 97.6°F | Wt 155.0 lb

## 2014-12-01 DIAGNOSIS — M25472 Effusion, left ankle: Secondary | ICD-10-CM | POA: Diagnosis not present

## 2014-12-01 DIAGNOSIS — M7989 Other specified soft tissue disorders: Secondary | ICD-10-CM | POA: Diagnosis not present

## 2014-12-01 DIAGNOSIS — M25475 Effusion, left foot: Secondary | ICD-10-CM | POA: Diagnosis not present

## 2014-12-01 DIAGNOSIS — M25572 Pain in left ankle and joints of left foot: Secondary | ICD-10-CM | POA: Diagnosis not present

## 2014-12-01 LAB — URIC ACID: URIC ACID, SERUM: 4.2 mg/dL (ref 2.4–7.0)

## 2014-12-01 NOTE — Telephone Encounter (Signed)
Pt has hx of PE and is presently taking Coumadin 7.5 mg on Tues,Thur, Sat And Sunday and taking 3.75 mg on M-W-F. Since 11/27/14 pt has noticed some swelling and pain on inside of lt ankle; pain level <5. Pt is concerned about possible DVT. Swelling and crampy type pain in lt ankle seems worse at night and pt has not noticed if swelling is better in AM after feet up all night. Pt has no CP,SOB,H/A or dizziness and does not appear in any distress. No redness or warmth noticed at swollen area. Pt has compression socks that she wears when traveling and did not know if should wear them all the time to see if that would help swelling. Amber at front desk offered appt at [facility] but pt did not want to travel that distance; pt did not want to go to UC unless absolutely necessary. Pt request cb. Advised pt if condition changed or worsened prior to cb to go to nearest ED for eval. Pt voiced understanding.

## 2014-12-01 NOTE — Progress Notes (Signed)
Subjective:    Patient ID: Becky Farrell, female    DOB: 07-Sep-1934, 80 y.o.   MRN: [id number]  HPI  Pt presents to the clinic today with c/o pain and swelling of her left lower leg. She noticed this 4-5 days ago but seems to have gotten worse over the last 2 days. She describes the pain as sore and achy. The pain is worse at night. She has no pain with walking. She denies any injury to the area. She does have a history of PE but is therapeutic on coumadin.  Review of Systems      Past Medical History  Diagnosis Date  . Hypertension   . Seizures   . History of pulmonary embolism   . Melanoma 2003  . Colon polyps     Current Outpatient Prescriptions  Medication Sig Dispense Refill  . aspirin 81 MG tablet Take 81 mg by mouth daily.    Marland Kitchen atenolol (TENORMIN) 25 MG tablet Take 1 tablet (25 mg total) by mouth daily. 30 tablet 3  . Cholecalciferol (VITAMIN D3) 2000 UNITS TABS Take one by mouth daily    . conjugated estrogens (PREMARIN) vaginal cream Place 0.5 g vaginally once a week.    . hydrochlorothiazide (MICROZIDE) 12.5 MG capsule Take 1 capsule (12.5 mg total) by mouth daily. 30 capsule 3  . lisinopril (PRINIVIL,ZESTRIL) 10 MG tablet TAKE 1 TABLET BY MOUTH EVERY DAY 30 tablet 2  . warfarin (COUMADIN) 7.5 MG tablet TAKE 1 TABLET EVERY DAY AND AS DIRECTED 70 tablet 2   No current facility-administered medications for this visit.    Allergies  Allergen Reactions  . Clobetasol     vertigo  . Phenergan [Promethazine Hcl]     ? Rapid heart rate  . Zithromax [Azithromycin]     ? Rapid heart rate    Family History  Problem Relation Age of Onset  . Heart disease Mother     History   Social History  . Marital Status: Married    Spouse Name: N/A    Number of Children: N/A  . Years of Education: N/A   Occupational History  . Not on file.   Social History Main Topics  . Smoking status: Never Smoker   . Smokeless tobacco: Not on file  . Alcohol Use: Not on file  .  Drug Use: Not on file  . Sexual Activity: Not on file   Other Topics Concern  . Not on file   Social History Narrative   Recently moved from [place] to [facility].   Daughter and grandchildren live in [place].   Has other children in [place].   Retired Pharmacist, hospital.      Husband has been recovering from Prostate CA.     Constitutional: Denies fever, malaise, fatigue, headache or abrupt weight changes.  Respiratory: Denies difficulty breathing, shortness of breath, cough or sputum production.   Cardiovascular: Denies chest pain, chest tightness, palpitations or swelling in the hands or feet.  Musculoskeletal: Pt reports left lower leg pain and swelling. Denies decrease in range of motion, difficulty with gait, muscle pain.  Skin: Pt reports redness and warmth to left ankle. Denies rashes, lesions or ulcercations.    No other specific complaints in a complete review of systems (except as listed in HPI above).  Objective:   Physical Exam  BP 110/58 mmHg  Pulse 69  Temp(Src) 97.6 F (36.4 C) (Oral)  Wt 155 lb (70.308 kg)  SpO2 98% Wt Readings from Last 3  Encounters:  12/01/14 155 lb (70.308 kg)  08/06/14 156 lb 8 oz (70.988 kg)  01/01/14 154 lb (69.854 kg)    General: Appears her stated age, well developed, well nourished in NAD. Skin: Warm, dry and intact. Left ankle warm and red. Cardiovascular: Normal rate and rhythm. S1,S2 noted.  No murmur, rubs or gallops noted. Pedal pulses 2+ bilaterally. Pulmonary/Chest: Normal effort and positive vesicular breath sounds. No respiratory distress. No wheezes, rales or ronchi noted.  Musculoskeletal: Normal flexion and extension of the left ankle. Pain with palpation over the medial malleolus. 1+ swelling noted of the medical malleolus, nontender to palpation. No difficulty with gait.     BMET    Component Value Date/Time   NA 140 01/01/2014 1556   K 3.5 01/01/2014 1556   CL 101 01/01/2014 1556   CO2 32 01/01/2014 1556   GLUCOSE 78  01/01/2014 1556   BUN 23 01/01/2014 1556   CREATININE 0.74 01/01/2014 1556   CREATININE 0.7 12/04/2012 1100   CALCIUM 9.7 01/01/2014 1556    Lipid Panel     Component Value Date/Time   CHOL 211* 01/01/2014 1556   TRIG 114 01/01/2014 1556   HDL 70 01/01/2014 1556   CHOLHDL 3.0 01/01/2014 1556   VLDL 23 01/01/2014 1556   LDLCALC 118* 01/01/2014 1556    CBC    Component Value Date/Time   WBC 4.1* 11/04/2012 1105   RBC 3.86* 11/04/2012 1105   HGB 13.0 11/04/2012 1105   HCT 39.5 11/04/2012 1105   PLT 183.0 11/04/2012 1105   MCV 102.5* 11/04/2012 1105   MCHC 32.9 11/04/2012 1105   RDW 12.6 11/04/2012 1105   LYMPHSABS 0.9 11/04/2012 1105   MONOABS 0.4 11/04/2012 1105   EOSABS 0.0 11/04/2012 1105   BASOSABS 0.0 11/04/2012 1105    Hgb A1C No results found for: HGBA1C       Assessment & Plan:   Left ankle pain and swelling:  I do not think this is a DVT which is something she is concerned about Will xray left ankle Will check uric acid level Keep you ankle elevated and put ice on it for now  Will follow up with you after labs and xray are back

## 2014-12-01 NOTE — Telephone Encounter (Signed)
OK for appt tommorow if available, if not UC

## 2014-12-01 NOTE — Progress Notes (Signed)
Pre visit review using our clinic review tool, if applicable. No additional management support is needed unless otherwise documented below in the visit note. 

## 2014-12-01 NOTE — Telephone Encounter (Signed)
Amber called pt when 12/01/14 at 2 pm became available and pt scheduled appt for today.

## 2014-12-01 NOTE — Patient Instructions (Signed)

## 2014-12-08 DIAGNOSIS — H40013 Open angle with borderline findings, low risk, bilateral: Secondary | ICD-10-CM | POA: Diagnosis not present

## 2014-12-16 ENCOUNTER — Other Ambulatory Visit: Payer: Self-pay | Admitting: Family Medicine

## 2014-12-20 DIAGNOSIS — Z7901 Long term (current) use of anticoagulants: Secondary | ICD-10-CM | POA: Diagnosis not present

## 2015-01-08 ENCOUNTER — Other Ambulatory Visit: Payer: Self-pay | Admitting: Family Medicine

## 2015-01-20 DIAGNOSIS — Z7901 Long term (current) use of anticoagulants: Secondary | ICD-10-CM | POA: Diagnosis not present

## 2015-02-14 ENCOUNTER — Other Ambulatory Visit: Payer: Medicare Other

## 2015-02-15 ENCOUNTER — Ambulatory Visit (INDEPENDENT_AMBULATORY_CARE_PROVIDER_SITE_OTHER): Payer: Medicare Other | Admitting: Podiatry

## 2015-02-15 DIAGNOSIS — M79676 Pain in unspecified toe(s): Secondary | ICD-10-CM

## 2015-02-15 DIAGNOSIS — B351 Tinea unguium: Secondary | ICD-10-CM

## 2015-02-16 NOTE — Progress Notes (Signed)
Patient ID: Becky Farrell, female   DOB: [birth year], 81 y.o.   MRN: [id number]  Subjective: 81 y.o.-year-old female returns the office today for painful, elongated, thickened toenails which she is unable to trim herself. Denies any redness or drainage around the nails. Denies any acute changes since last appointment and no new complaints today. Denies any systemic complaints such as fevers, chills, nausea, vomiting.   Objective: AAO 3, NAD DP/PT pulses palpable, CRT less than 3 seconds Protective sensation intact with Simms Weinstein monofilament, Achilles tendon reflex intact.  Nails hypertrophic, dystrophic, elongated, brittle, discolored 10. There is tenderness overlying the nails 1-5 bilaterally. There is no surrounding erythema or drainage along the nail sites. No open lesions or pre-ulcerative lesions are identified. No other areas of tenderness bilateral lower extremities. No overlying edema, erythema, increased warmth. No pain with calf compression, swelling, warmth, erythema.  Assessment: Patient presents with symptomatic onychomycosis  Plan: -Treatment options including alternatives, risks, complications were discussed -Nails sharply debrided 10 without complication/bleeding. -Discussed daily foot inspection. If there are any changes, to call the office immediately.  -Follow-up in 3 months or sooner if any problems are to arise. In the meantime, encouraged to call the office with any questions, concerns, changes symptoms.

## 2015-02-19 ENCOUNTER — Other Ambulatory Visit: Payer: Self-pay | Admitting: Family Medicine

## 2015-02-20 ENCOUNTER — Other Ambulatory Visit: Payer: Self-pay | Admitting: Family Medicine

## 2015-02-21 DIAGNOSIS — Z7901 Long term (current) use of anticoagulants: Secondary | ICD-10-CM | POA: Diagnosis not present

## 2015-02-21 NOTE — Telephone Encounter (Signed)
Pt's last appt with you was in 08/2014, no future appt scheduled, ok to refill?

## 2015-02-28 DIAGNOSIS — Z7901 Long term (current) use of anticoagulants: Secondary | ICD-10-CM | POA: Diagnosis not present

## 2015-02-28 LAB — PROTIME-INR

## 2015-03-01 ENCOUNTER — Encounter: Payer: Self-pay | Admitting: Family Medicine

## 2015-03-11 DIAGNOSIS — H93299 Other abnormal auditory perceptions, unspecified ear: Secondary | ICD-10-CM | POA: Diagnosis not present

## 2015-03-11 DIAGNOSIS — H6123 Impacted cerumen, bilateral: Secondary | ICD-10-CM | POA: Diagnosis not present

## 2015-03-23 DIAGNOSIS — G40909 Epilepsy, unspecified, not intractable, without status epilepticus: Secondary | ICD-10-CM | POA: Diagnosis not present

## 2015-03-23 DIAGNOSIS — R200 Anesthesia of skin: Secondary | ICD-10-CM | POA: Diagnosis not present

## 2015-03-23 DIAGNOSIS — R262 Difficulty in walking, not elsewhere classified: Secondary | ICD-10-CM | POA: Diagnosis not present

## 2015-03-23 DIAGNOSIS — R270 Ataxia, unspecified: Secondary | ICD-10-CM | POA: Diagnosis not present

## 2015-03-26 NOTE — Op Note (Signed)
PATIENT NAME:  Becky Farrell, Becky Farrell A MR#:  [id number] DATE OF BIRTH:  1934/10/17  DATE OF PROCEDURE:  05/11/2014  PREOPERATIVE DIAGNOSIS: Pelvic relaxation leading to prolapse, cystocele, and pessary use with intolerance to pessary.  POSTOPERATIVE DIAGNOSIS: Pelvic relaxation leading to prolapse, cystocele, and pessary use with intolerance to pessary.  PROCEDURES: Total vaginal hysterectomy, bilateral salpingo-oophorectomy, anterior repair cystocele, uterosacral ligament plication.   SURGEON: Delsa Sale, M.D.   ESTIMATED BLOOD  LOSS: Approximately 200 mL.   FINDINGS: Stage 3 prolapse, cystocele, bilateral ureteral patency as viewed by cystoscopy.   DESCRIPTION OF PROCEDURE: The patient was taken to the operating room and placed in the supine position. After adequate endotracheal anesthesia was instilled, the patient was prepped and draped in the usual sterile fashion, placed in candy-cane stirrups. Time out was performed. Side-opening speculum was placed in the patient's vagina and the cervix was grasped with a double-tooth tenaculum. Speculum was removed. The uterosacral ligaments were (Dictation Anomaly) massaged to lengthen the descent of the uterus. The cervix was circumferentially injected with lidocaine with epinephrine. The posterior cul-de-sac was grasped and entered sharply with the Mayo scissors. A small weighted speculum was placed through this peritoneum after a suture was used to approximate the peritoneum to the vaginal mucosa. Curved Heaney probe was then used to take a small bite, first on the right then on the left at the longest portion of the vaginal mucosa that touched the deep cervix to avoid the ureters. This was clamped, cut and suture ligated. The Bovie was then used to circumferentially (Dictation Anomaly) cut an incisionaround the cervix. The bladder mucosa was pushed off of the cervix. A suture was used to approximate the peritoneum to the anterior vaginal mucosa. The  uterosacrals were then grasped with (Dictation Anomaly) a heaney clamp, were clamped, cut and suture ligated. (Dictation Anomaly) the cardinal ligaments as well were clamped, cut and suture ligated. 3-0 Vicryls were then taken up the sides of the uterus to the utero-ovarian pedicles which was clamped across, cut and the uterus was removed from the patient's body. Babcocks were then used to grasp the tubes and ovaries. The infundibulopelvic ligament was  clamped, cut, suture ligated with a tie placed after the ligature had been placed. This was done bilaterally. Both tubes and ovaries were removed from the body. A moist mini laparotomy sponge was placed into the abdomen to hold back the bowel. This was attached to a clamp. The Ethibond suture was then used to go through the posterior cul-de-sac to the right uterosacral ligament through the posterior peritoneum, across to the left uterosacral and down to the posterior peritoneum next to the suture. This was then tied and tacked up to approximate the uterosacrals as well as the whole of the posterior cul-de-sac. (Dictation Anomaly) several  sutures were placed across the top of the vagina, figure-of-eight sutures. These were then cut. The urethra was then gasped with an Allis clamp and the vaginal mucosa was injected with lidocaine and epinephrine. Knife was used to cut a vertical incision to the vaginal cuff and Allis Adair clamps were placed on the extra vaginal mucosa which was then sharply and bluntly dissected off the bladder. 2-0 Vicryl was then used to approximate the fascial edges of the vaginal mucosa, tying it in the midline. Two layers were placed. The bladder was (Dictation Anomaly) grasped with pickup. Approximately 1/4 inch of vaginal mucosa was removed bilateral, and a running Monocryl suture was then used to approximate the vaginal mucosa. Good hemostasis  identified. Moist vaginal packing was placed. Cystoscopy was then performed. Bilateral efflux from  both ureters was identified. Foley catheter was placed. The patient was (Dictation Anomaly)then taken to recovery after having tolerated the procedure well.   ____________________________ Delsa Sale, MD cck:sb D: 05/14/2014 08:39:43 ET T: 05/14/2014 09:13:53 ET JOB#: [id number]  cc: Delsa Sale, MD, <Dictator> Delsa Sale MD ELECTRONICALLY SIGNED 05/24/2014 20:50

## 2015-03-28 ENCOUNTER — Encounter: Payer: Self-pay | Admitting: Family Medicine

## 2015-03-28 DIAGNOSIS — Z7901 Long term (current) use of anticoagulants: Secondary | ICD-10-CM | POA: Diagnosis not present

## 2015-03-28 LAB — PROTIME-INR

## 2015-04-21 ENCOUNTER — Other Ambulatory Visit: Payer: Self-pay | Admitting: Family Medicine

## 2015-04-25 DIAGNOSIS — Z7901 Long term (current) use of anticoagulants: Secondary | ICD-10-CM | POA: Diagnosis not present

## 2015-04-26 ENCOUNTER — Telehealth: Payer: Self-pay

## 2015-04-26 MED ORDER — WARFARIN SODIUM 5 MG PO TABS
ORAL_TABLET | ORAL | Status: DC
Start: 2015-04-26 — End: 2017-02-16

## 2015-04-26 NOTE — Telephone Encounter (Signed)
eRx sent

## 2015-04-26 NOTE — Telephone Encounter (Signed)
Caryl Pina, nurse with independent living at [facility] left v/m; pt's dosing for coumadin has been changed and Caryl Pina request coumadin 5 mg sent to [facility]. Presently pt only has 7.5 mg tab. Pt will not need the 5 mg dose until Fri.Caryl Pina request cb with confirmation that new rx for Coumadin 5 mg sent to [facility] so [facility]/28/15.

## 2015-05-05 ENCOUNTER — Encounter: Payer: Self-pay | Admitting: Family Medicine

## 2015-05-05 DIAGNOSIS — Z7901 Long term (current) use of anticoagulants: Secondary | ICD-10-CM | POA: Diagnosis not present

## 2015-05-05 LAB — PROTIME-INR

## 2015-05-10 ENCOUNTER — Ambulatory Visit (INDEPENDENT_AMBULATORY_CARE_PROVIDER_SITE_OTHER): Payer: Medicare Other | Admitting: Podiatry

## 2015-05-10 DIAGNOSIS — M79676 Pain in unspecified toe(s): Secondary | ICD-10-CM | POA: Diagnosis not present

## 2015-05-10 DIAGNOSIS — B351 Tinea unguium: Secondary | ICD-10-CM

## 2015-05-10 NOTE — Progress Notes (Signed)
Subjective: 81 y.o.-year-old female  returns the office today for painful, elongated, thickened toenails. Denies any redness or drainage around the nails. Denies any acute changes since last appointment and no new complaints today. Denies any systemic complaints such as fevers, chills, nausea, vomiting.   Objective: AAO 3, NAD DP/PT pulses palpable, CRT less than 3 seconds Nails hypertrophic, dystrophic, elongated, brittle, discolored 10. There is tenderness overlying these nails. There is no surrounding erythema or drainage along the nail sites. No open lesions or pre-ulcerative lesions are identified. No other areas of tenderness bilateral lower extremities. No overlying edema, erythema, increased warmth. No pain with calf compression, swelling, warmth, erythema.  Assessment: Patient presents with symptomatic onychomycosis  Plan: -Treatment options including alternatives, risks, complications were discussed -Nails sharply debrided 10 without complication/bleeding. -Discussed daily foot inspection. If there are any changes, to call the office immediately.  -Follow-up in 3 months or sooner if any problems are to arise. In the meantime, encouraged to call the office with any questions, concerns, changes symptoms.

## 2015-05-12 DIAGNOSIS — Z7901 Long term (current) use of anticoagulants: Secondary | ICD-10-CM | POA: Diagnosis not present

## 2015-05-16 ENCOUNTER — Ambulatory Visit (INDEPENDENT_AMBULATORY_CARE_PROVIDER_SITE_OTHER): Payer: Medicare Other | Admitting: Family Medicine

## 2015-05-16 ENCOUNTER — Encounter: Payer: Self-pay | Admitting: Family Medicine

## 2015-05-16 VITALS — BP 128/72 | HR 74 | Temp 97.8°F | Wt 157.2 lb

## 2015-05-16 DIAGNOSIS — R569 Unspecified convulsions: Secondary | ICD-10-CM

## 2015-05-16 DIAGNOSIS — N390 Urinary tract infection, site not specified: Secondary | ICD-10-CM | POA: Insufficient documentation

## 2015-05-16 DIAGNOSIS — I10 Essential (primary) hypertension: Secondary | ICD-10-CM

## 2015-05-16 DIAGNOSIS — N3001 Acute cystitis with hematuria: Secondary | ICD-10-CM

## 2015-05-16 DIAGNOSIS — G2581 Restless legs syndrome: Secondary | ICD-10-CM

## 2015-05-16 DIAGNOSIS — Z79899 Other long term (current) drug therapy: Secondary | ICD-10-CM | POA: Diagnosis not present

## 2015-05-16 DIAGNOSIS — R194 Change in bowel habit: Secondary | ICD-10-CM | POA: Insufficient documentation

## 2015-05-16 DIAGNOSIS — L304 Erythema intertrigo: Secondary | ICD-10-CM | POA: Diagnosis not present

## 2015-05-16 DIAGNOSIS — R300 Dysuria: Secondary | ICD-10-CM | POA: Diagnosis not present

## 2015-05-16 LAB — COMPREHENSIVE METABOLIC PANEL
ALBUMIN: 4 g/dL (ref 3.5–5.2)
ALK PHOS: 70 U/L (ref 39–117)
ALT: 14 U/L (ref 0–35)
AST: 18 U/L (ref 0–37)
BUN: 23 mg/dL (ref 6–23)
CALCIUM: 9.7 mg/dL (ref 8.4–10.5)
CHLORIDE: 102 meq/L (ref 96–112)
CO2: 28 mEq/L (ref 19–32)
Creatinine, Ser: 0.79 mg/dL (ref 0.40–1.20)
GFR: 74.66 mL/min (ref >60.00)
Glucose, Bld: 93 mg/dL (ref 70–99)
POTASSIUM: 3.7 meq/L (ref 3.5–5.1)
SODIUM: 135 meq/L (ref 135–145)
TOTAL PROTEIN: 6.8 g/dL (ref 6.0–8.3)
Total Bilirubin: 0.4 mg/dL (ref 0.2–1.2)

## 2015-05-16 LAB — POCT URINALYSIS DIPSTICK
Bilirubin, UA: NEGATIVE
Glucose, UA: NEGATIVE
KETONES UA: NEGATIVE
Nitrite, UA: NEGATIVE
PH UA: 6
PROTEIN UA: NEGATIVE
SPEC GRAV UA: 1.025
UROBILINOGEN UA: 0.2

## 2015-05-16 LAB — CBC WITH DIFFERENTIAL/PLATELET
Basophils Absolute: 0 10*3/uL (ref 0.0–0.1)
Basophils Relative: 0.2 % (ref 0.0–3.0)
EOS PCT: 1.2 % (ref 0.0–5.0)
Eosinophils Absolute: 0.1 10*3/uL (ref 0.0–0.7)
HEMATOCRIT: 39.1 % (ref 36.0–46.0)
HEMOGLOBIN: 13.1 g/dL (ref 12.0–15.0)
LYMPHS ABS: 1.5 10*3/uL (ref 0.7–4.0)
LYMPHS PCT: 21.8 % (ref 12.0–46.0)
MCHC: 33.4 g/dL (ref 30.0–36.0)
MCV: 95.5 fl (ref 78.0–100.0)
MONOS PCT: 8.4 % (ref 3.0–12.0)
Monocytes Absolute: 0.6 10*3/uL (ref 0.1–1.0)
NEUTROS ABS: 4.7 10*3/uL (ref 1.4–7.7)
Neutrophils Relative %: 68.4 % (ref 43.0–77.0)
Platelets: 207 10*3/uL (ref 150.0–400.0)
RBC: 4.09 Mil/uL (ref 3.87–5.11)
RDW: 13 % (ref 11.5–15.5)
WBC: 6.9 10*3/uL (ref 4.0–10.5)

## 2015-05-16 LAB — MAGNESIUM: Magnesium: 2 mg/dL (ref 1.5–2.5)

## 2015-05-16 LAB — TSH: TSH: 0.92 u[IU]/mL (ref 0.35–4.50)

## 2015-05-16 LAB — VITAMIN B12: VITAMIN B 12: 344 pg/mL (ref 211–911)

## 2015-05-16 MED ORDER — CIPROFLOXACIN HCL 250 MG PO TABS: 250.0000 mg | ORAL_TABLET | Freq: Two times a day (BID) | ORAL | Status: DC

## 2015-05-16 NOTE — Progress Notes (Signed)
Pre visit review using our clinic review tool, if applicable. No additional management support is needed unless otherwise documented below in the visit note. 

## 2015-05-16 NOTE — Assessment & Plan Note (Signed)
New- UA pos for LE and RBCs. Treat with 5 day course of cipro 250 mg twice daily. Recheck INR on 6/23 (when she gets back from vacation)- email sent to Fransico Him, RN who will set this up at [facility]. She is aware of signs of bleeding that would warrant sooner follow up.

## 2015-05-16 NOTE — Assessment & Plan Note (Signed)
New- Cipro 250 mg twice daily x 5 days. Send urine for cx.

## 2015-05-16 NOTE — Progress Notes (Signed)
Subjective:    Patient ID: Becky Farrell, female    DOB: [birth year], 81 y.o.   MRN: [id number]  HPI  Very pleasant 81 yo female here for follow up with multiple complaints, changes in bowel habits, including dysuria, ? Nocturnal leg restless.  Dysuria- burning with increased frequency x 3 days.  No fevers, chills, nausea or vomiting.  ? RLS- feels her legs are "restless" at night, right > left. No LE weakness.  Changes in bowel habits- h/o polpys.  Last colonoscopy in 2012.  No blood in her stool.  H/o PE-  Had multiple PEs in 2011 when driving [place] the country.  Started on coumadin and stopped it 6 months later at the advise of her physician since it was her only event. Had another PE months later, restarted on life long coumadin.  Comes to coumadin clinic here. Lab Results  Component Value Date   INR 1.9 11/22/2014   INR 2.43 08/12/2014   INR 2.69 07/19/2014   HTN- on lisinopril 20 mg daily, atenolol 25 mg daily and HCTZ 12.5 mg daily. Denies any CP, SOB or LE edema.  Lab Results  Component Value Date   NA 140 01/01/2014   K 3.5 01/01/2014   CL 101 01/01/2014   CO2 32 01/01/2014   Lab Results  Component Value Date   CHOL 211* 01/01/2014   HDL 70 01/01/2014   LDLCALC 118* 01/01/2014   LDLDIRECT 158.5 11/04/2012   TRIG 114 01/01/2014   CHOLHDL 3.0 01/01/2014   Lab Results  Component Value Date   WBC 4.8 05/05/2014   HGB 13.8 05/05/2014   HCT 36.4 05/12/2014   MCV 97 05/05/2014   PLT 163 05/05/2014     Patient Active Problem List   Diagnosis Date Noted  . Dizziness and giddiness 08/06/2014  . Encounter for therapeutic drug monitoring 01/14/2014  . Osteopenia 01/29/2013  . Hyponatremia 12/04/2012  . Pessary maintenance 05/13/2012  . Uterine prolapse 05/13/2012  . History of melanoma 05/13/2012  . Hypertension   . Seizures   . History of pulmonary embolism    Past Medical History  Diagnosis Date  . Hypertension   . Seizures   . History of  pulmonary embolism   . Melanoma 2003  . Colon polyps    Past Surgical History  Procedure Laterality Date  . Left hand surgery  05/2011    s/p fall  . Abdominal hysterectomy     History  Substance Use Topics  . Smoking status: Never Smoker   . Smokeless tobacco: Not on file  . Alcohol Use: Not on file   Family History  Problem Relation Age of Onset  . Heart disease Mother    Allergies  Allergen Reactions  . Clobetasol     vertigo  . Phenergan [Promethazine Hcl]     ? Rapid heart rate  . Zithromax [Azithromycin]     ? Rapid heart rate   Current Outpatient Prescriptions on File Prior to Visit  Medication Sig Dispense Refill  . aspirin 81 MG tablet Take 81 mg by mouth daily.    Marland Kitchen atenolol (TENORMIN) 25 MG tablet TAKE 1 TABLET BY MOUTH EVERY DAY 30 tablet 3  . Cholecalciferol (VITAMIN D3) 2000 UNITS TABS Take one by mouth daily    . hydrochlorothiazide (MICROZIDE) 12.5 MG capsule TAKE 1 CAPSULE (12.5 MG TOTAL) BY MOUTH DAILY. 30 capsule 3  . lisinopril (PRINIVIL,ZESTRIL) 10 MG tablet TAKE 1 TABLET BY MOUTH EVERY DAY 30 tablet 6  . warfarin (  COUMADIN) 5 MG tablet Use as directed. 30 tablet 3  . warfarin (COUMADIN) 7.5 MG tablet TAKE 1 TABLET EVERY DAY AND AS DIRECTED 70 tablet 2   No current facility-administered medications on file prior to visit.   The PMH, PSH, Social History, Family History, Medications, and allergies have been reviewed in [facility], and have been updated if relevant.   Review of Systems Review of Systems  Constitutional: Negative.   HENT: Negative.   Eyes: Negative.   Respiratory: Negative.   Cardiovascular: Negative.   Gastrointestinal: Positive for diarrhea and constipation. Negative for nausea, vomiting, abdominal pain, blood in stool, anal bleeding and rectal pain.  Endocrine: Negative.   Genitourinary: Positive for dysuria, frequency and decreased urine volume. Negative for urgency, hematuria, genital sores, menstrual problem and pelvic pain.   Musculoskeletal: Positive for myalgias.  Skin: Negative.   Allergic/Immunologic: Negative.   Neurological: Negative for tremors, weakness, light-headedness and headaches.  Hematological: Negative.   Psychiatric/Behavioral: Negative.   All other systems reviewed and are negative.      Objective:   Physical Exam BP 128/72 mmHg  Pulse 74  Temp(Src) 97.8 F (36.6 C) (Oral)  Wt 157 lb 4 oz (71.328 kg)  SpO2 97%  Wt Readings from Last 3 Encounters:  05/16/15 157 lb 4 oz (71.328 kg)  12/01/14 155 lb (70.308 kg)  08/06/14 156 lb 8 oz (70.988 kg)    Physical Exam  Constitutional: She is well-developed, well-nourished, and in no distress.  HENT:  Head: Normocephalic and atraumatic.  Eyes: Conjunctivae are normal.  Cardiovascular: Normal rate and regular rhythm.   Pulmonary/Chest: Effort normal and breath sounds normal.  Abdominal: Soft. Bowel sounds are normal. She exhibits no distension. There is no tenderness.  Musculoskeletal: Normal range of motion. She exhibits no edema or tenderness.  Neurological: She is alert.  Skin: Skin is warm and dry.  Psychiatric: Memory, affect and judgment normal.  Nursing note and vitals reviewed.        Assessment & Plan:

## 2015-05-16 NOTE — Assessment & Plan Note (Signed)
Well controlled on current rxs. No changes made. 

## 2015-05-16 NOTE — Addendum Note (Signed)
Addended by: Ellamae Sia on: 05/16/2015 02:54 PM   Modules accepted: Orders

## 2015-05-16 NOTE — Assessment & Plan Note (Signed)
New- check labs today as initial work up. The patient indicates understanding of these issues and agrees with the plan. Orders Placed This Encounter  Procedures  . Urine culture  . Fecal occult blood, imunochemical  . Magnesium  . Comprehensive metabolic panel  . CBC with Differential/Platelet  . TSH  . Vitamin B12  . Urinalysis Dipstick

## 2015-05-16 NOTE — Patient Instructions (Signed)
Great to see you. Please call Becky Farrell to set up your PT/INR the week you return.

## 2015-05-16 NOTE — Assessment & Plan Note (Signed)
New- she is refusing GI referral for now but agrees to IFOB. Orders entered.

## 2015-05-17 ENCOUNTER — Encounter: Payer: Self-pay | Admitting: *Deleted

## 2015-05-19 LAB — URINE CULTURE

## 2015-05-20 DIAGNOSIS — Z86718 Personal history of other venous thrombosis and embolism: Secondary | ICD-10-CM | POA: Diagnosis not present

## 2015-05-25 ENCOUNTER — Other Ambulatory Visit: Payer: Self-pay | Admitting: Family Medicine

## 2015-05-25 DIAGNOSIS — Z1231 Encounter for screening mammogram for malignant neoplasm of breast: Secondary | ICD-10-CM

## 2015-05-26 ENCOUNTER — Ambulatory Visit (INDEPENDENT_AMBULATORY_CARE_PROVIDER_SITE_OTHER): Payer: Medicare Other | Admitting: *Deleted

## 2015-05-26 DIAGNOSIS — Z5181 Encounter for therapeutic drug level monitoring: Secondary | ICD-10-CM

## 2015-05-26 DIAGNOSIS — Z7901 Long term (current) use of anticoagulants: Secondary | ICD-10-CM | POA: Diagnosis not present

## 2015-05-26 LAB — POCT INR: INR: 2.1

## 2015-05-26 NOTE — Progress Notes (Signed)
Pre visit review using our clinic review tool, if applicable. No additional management support is needed unless otherwise documented below in the visit note. 

## 2015-05-27 ENCOUNTER — Other Ambulatory Visit: Payer: Self-pay | Admitting: Family Medicine

## 2015-05-27 ENCOUNTER — Ambulatory Visit
Admission: RE | Admit: 2015-05-27 | Discharge: 2015-05-27 | Disposition: A | Discharge status: Home / Self Care | Payer: Medicare Other | Source: Ambulatory Visit | Attending: Family Medicine | Admitting: Family Medicine

## 2015-05-27 DIAGNOSIS — Z1231 Encounter for screening mammogram for malignant neoplasm of breast: Secondary | ICD-10-CM

## 2015-05-30 ENCOUNTER — Other Ambulatory Visit (INDEPENDENT_AMBULATORY_CARE_PROVIDER_SITE_OTHER): Payer: Medicare Other

## 2015-05-30 DIAGNOSIS — R194 Change in bowel habit: Secondary | ICD-10-CM

## 2015-05-30 LAB — FECAL OCCULT BLOOD, IMMUNOCHEMICAL: Fecal Occult Bld: NEGATIVE

## 2015-05-31 ENCOUNTER — Encounter: Payer: Self-pay | Admitting: *Deleted

## 2015-06-07 ENCOUNTER — Telehealth: Payer: Self-pay | Admitting: *Deleted

## 2015-06-07 NOTE — Telephone Encounter (Signed)
Error

## 2015-06-22 ENCOUNTER — Other Ambulatory Visit: Payer: Self-pay | Admitting: Family Medicine

## 2015-06-23 ENCOUNTER — Ambulatory Visit (INDEPENDENT_AMBULATORY_CARE_PROVIDER_SITE_OTHER): Payer: Medicare Other | Admitting: *Deleted

## 2015-06-23 DIAGNOSIS — Z5181 Encounter for therapeutic drug level monitoring: Secondary | ICD-10-CM

## 2015-06-23 LAB — POCT INR: INR: 2.35

## 2015-07-21 ENCOUNTER — Ambulatory Visit (INDEPENDENT_AMBULATORY_CARE_PROVIDER_SITE_OTHER): Payer: Medicare Other | Admitting: *Deleted

## 2015-07-21 DIAGNOSIS — Z7901 Long term (current) use of anticoagulants: Secondary | ICD-10-CM | POA: Diagnosis not present

## 2015-07-21 DIAGNOSIS — Z5181 Encounter for therapeutic drug level monitoring: Secondary | ICD-10-CM

## 2015-07-21 LAB — POCT INR: INR: 2.39

## 2015-07-21 NOTE — Progress Notes (Signed)
Pre visit review using our clinic review tool, if applicable. No additional management support is needed unless otherwise documented below in the visit note. 

## 2015-08-08 ENCOUNTER — Other Ambulatory Visit: Payer: Self-pay | Admitting: Family Medicine

## 2015-08-16 ENCOUNTER — Ambulatory Visit: Payer: Medicare Other

## 2015-08-19 ENCOUNTER — Ambulatory Visit (INDEPENDENT_AMBULATORY_CARE_PROVIDER_SITE_OTHER): Payer: Medicare Other | Admitting: Internal Medicine

## 2015-08-19 ENCOUNTER — Encounter: Payer: Self-pay | Admitting: Internal Medicine

## 2015-08-19 VITALS — BP 120/64 | HR 86 | Temp 97.5°F | Wt 155.0 lb

## 2015-08-19 DIAGNOSIS — B9789 Other viral agents as the cause of diseases classified elsewhere: Principal | ICD-10-CM

## 2015-08-19 DIAGNOSIS — J069 Acute upper respiratory infection, unspecified: Secondary | ICD-10-CM | POA: Diagnosis not present

## 2015-08-19 MED ORDER — ALBUTEROL SULFATE HFA 108 (90 BASE) MCG/ACT IN AERS: 2.0000 | INHALATION_SPRAY | Freq: Four times a day (QID) | RESPIRATORY_TRACT | Status: DC | PRN

## 2015-08-19 NOTE — Progress Notes (Signed)
HPI  Pt presents to the clinic today with c/o cough and chest congestion. This started 4-5 days ago. The cough is dry and nonproductive. She denies shortness of breath. She denies ear pain, runny nose or sore throat. She denies fever, chills or body aches. She has not tried anything OTC. She has had a PE in the past but reports this feels completely different. She does not think that she has had sick contacts but she did travel to [place] last week. She has no history of seasonal allergies.  Review of Systems      Past Medical History  Diagnosis Date  . Hypertension   . Seizures   . History of pulmonary embolism   . Colon polyps   . Melanoma 2003    Family History  Problem Relation Age of Onset  . Heart disease Mother   . Breast cancer Daughter 11    Social History   Social History  . Marital Status: Married    Spouse Name: N/A  . Number of Children: N/A  . Years of Education: N/A   Occupational History  . Not on file.   Social History Main Topics  . Smoking status: Never Smoker   . Smokeless tobacco: Not on file  . Alcohol Use: Not on file  . Drug Use: Not on file  . Sexual Activity: Not on file   Other Topics Concern  . Not on file   Social History Narrative   Recently moved from [place] to [facility].   Daughter and grandchildren live in [place].   Has other children in [place].   Retired Pharmacist, hospital.      Husband has been recovering from Prostate CA.    Allergies  Allergen Reactions  . Clobetasol     vertigo  . Phenergan [Promethazine Hcl]     ? Rapid heart rate  . Zithromax [Azithromycin]     ? Rapid heart rate     Constitutional: Denies headache, fatigue, fever or abrupt weight changes.  HEENT: Denies eye redness, eye pain, pressure behind the eyes, facial pain, nasal congestion, ear pain, ringing in the ears, wax buildup, runny nose or sore throat. Respiratory: Positive cough. Denies difficulty breathing or shortness of breath.  Cardiovascular: Denies  chest pain, chest tightness, palpitations or swelling in the hands or feet.   No other specific complaints in a complete review of systems (except as listed in HPI above).  Objective:   BP 120/64 mmHg  Pulse 86  Temp(Src) 97.5 F (36.4 C) (Oral)  Wt 155 lb (70.308 kg)  SpO2 98% Wt Readings from Last 3 Encounters:  08/19/15 155 lb (70.308 kg)  05/16/15 157 lb 4 oz (71.328 kg)  12/01/14 155 lb (70.308 kg)     General: Appears her stated age, well developed, well nourished in NAD. HEENT: Head: normal shape and size, no sinus tenderness noted; Eyes: sclera white, no icterus, conjunctiva pink; Ears: Tm's gray and intact, normal light reflex; Nose: mucosa pink and moist, septum midline; Throat/Mouth: Teeth present, mucosa pink and moist, no exudate noted, no lesions or ulcerations noted.  Neck: No cervical lymphadenopathy.  Cardiovascular: Normal rate and rhythm. S1,S2 noted.  No murmur, rubs or gallops noted.  Pulmonary/Chest: Normal effort and positive vesicular breath sounds. Intermittent inspiratory wheeze noted. No respiratory distress. No rales or ronchi noted.      Assessment & Plan:   Upper Respiratory Infection with Cough:  Likely viral Get some rest and drink plenty of water Start Mucinex OTC BID  Delsym as needed for cough eRx for Albuterol inhaler for wheeze  RTC as needed or if symptoms persist.

## 2015-08-19 NOTE — Patient Instructions (Signed)
Cough, Adult  A cough is a reflex that helps clear your throat and airways. It can help heal the body or may be a reaction to an irritated airway. A cough may only last 2 or 3 weeks (acute) or may last more than 8 weeks (chronic).  CAUSES Acute cough:  Viral or bacterial infections. Chronic cough:  Infections.  Allergies.  Asthma.  Post-nasal drip.  Smoking.  Heartburn or acid reflux.  Some medicines.  Chronic lung problems (COPD).  Cancer. SYMPTOMS   Cough.  Fever.  Chest pain.  Increased breathing rate.  High-pitched whistling sound when breathing (wheezing).  Colored mucus that you cough up (sputum). TREATMENT   A bacterial cough may be treated with antibiotic medicine.  A viral cough must run its course and will not respond to antibiotics.  Your caregiver may recommend other treatments if you have a chronic cough. HOME CARE INSTRUCTIONS   Only take over-the-counter or prescription medicines for pain, discomfort, or fever as directed by your caregiver. Use cough suppressants only as directed by your caregiver.  Use a cold steam vaporizer or humidifier in your bedroom or home to help loosen secretions.  Sleep in a semi-upright position if your cough is worse at night.  Rest as needed.  Stop smoking if you smoke. SEEK IMMEDIATE MEDICAL CARE IF:   You have pus in your sputum.  Your cough starts to worsen.  You cannot control your cough with suppressants and are losing sleep.  You begin coughing up blood.  You have difficulty breathing.  You develop pain which is getting worse or is uncontrolled with medicine.  You have a fever. MAKE SURE YOU:   Understand these instructions.  Will watch your condition.  Will get help right away if you are not doing well or get worse. Document Released: 05/18/2011 Document Revised: 02/11/2012 Document Reviewed: 05/18/2011 ExitCare Patient Information 2015 [employer]. This information is not intended  to replace advice given to you by your health care provider. Make sure you discuss any questions you have with your health care provider.  

## 2015-08-19 NOTE — Progress Notes (Signed)
Pre visit review using our clinic review tool, if applicable. No additional management support is needed unless otherwise documented below in the visit note. 

## 2015-08-22 ENCOUNTER — Telehealth: Payer: Self-pay

## 2015-08-22 NOTE — Telephone Encounter (Signed)
Her wheezing wasn't terrible but if she wants the shot it would be 80 mg of Depo. I am not sure of the cost.

## 2015-08-22 NOTE — Telephone Encounter (Signed)
Pt is aware as instructed and will start Mucinex as originally instructed and continue Delsym

## 2015-08-22 NOTE — Telephone Encounter (Signed)
Pt left v/m; pt seen 08/19/15 and pt thought R Baity NP told pt she could try albuterol inhaler or could get a shot to loosen congestion in her lungs. Albuterol inhaler cost to pt was going to be $76.00; pt wants cb to find out how much the shot would cost. Please advise.(do not see shot mentioned in 08/19/15 note).

## 2015-08-23 ENCOUNTER — Ambulatory Visit: Payer: Medicare Other

## 2015-08-25 ENCOUNTER — Ambulatory Visit (INDEPENDENT_AMBULATORY_CARE_PROVIDER_SITE_OTHER): Payer: Medicare Other | Admitting: *Deleted

## 2015-08-25 DIAGNOSIS — H6123 Impacted cerumen, bilateral: Secondary | ICD-10-CM | POA: Diagnosis not present

## 2015-08-25 DIAGNOSIS — H93299 Other abnormal auditory perceptions, unspecified ear: Secondary | ICD-10-CM | POA: Diagnosis not present

## 2015-08-25 DIAGNOSIS — Z5181 Encounter for therapeutic drug level monitoring: Secondary | ICD-10-CM

## 2015-08-25 DIAGNOSIS — Z7901 Long term (current) use of anticoagulants: Secondary | ICD-10-CM | POA: Diagnosis not present

## 2015-08-25 LAB — POCT INR: INR: 2.15

## 2015-09-01 ENCOUNTER — Ambulatory Visit (INDEPENDENT_AMBULATORY_CARE_PROVIDER_SITE_OTHER): Payer: Medicare Other | Admitting: Podiatry

## 2015-09-01 DIAGNOSIS — B351 Tinea unguium: Secondary | ICD-10-CM | POA: Diagnosis not present

## 2015-09-01 DIAGNOSIS — M79676 Pain in unspecified toe(s): Secondary | ICD-10-CM | POA: Diagnosis not present

## 2015-09-01 NOTE — Progress Notes (Signed)
Pre visit review using our clinic review tool, if applicable. No additional management support is needed unless otherwise documented below in the visit note. 

## 2015-09-01 NOTE — Progress Notes (Signed)
Subjective: 81 y.o.-year-old female  returns the office today for painful, elongated, thickened toenails which she is unable to trim herself. Denies any redness or drainage around the nails. Denies any acute changes since last appointment and no new complaints today. Denies any systemic complaints such as fevers, chills, nausea, vomiting.   Objective: AAO 3, NAD DP/PT pulses palpable, CRT less than 3 seconds Nails hypertrophic, dystrophic, elongated, brittle, discolored 10. There is tenderness overlying these nails. There is no surrounding erythema or drainage along the nail sites. No open lesions or pre-ulcerative lesions are identified. No other areas of tenderness bilateral lower extremities. No overlying edema, erythema, increased warmth. No pain with calf compression, swelling, warmth, erythema.  Assessment: Patient presents with symptomatic onychomycosis  Plan: -Treatment options including alternatives, risks, complications were discussed -Nails sharply debrided 10 without complication/bleeding. -Discussed daily foot inspection. If there are any changes, to call the office immediately.  -Follow-up in 3 months or sooner if any problems are to arise. In the meantime, encouraged to call the office with any questions, concerns, changes symptoms.   Celesta Gentile, DPM

## 2015-09-14 ENCOUNTER — Telehealth: Payer: Self-pay | Admitting: Family Medicine

## 2015-09-14 MED ORDER — WARFARIN SODIUM 7.5 MG PO TABS: ORAL_TABLET | ORAL | Status: DC

## 2015-09-14 NOTE — Telephone Encounter (Signed)
Pt came in today checking on her rx for warfin 7.5mg     She is almost out of these she stated she called pharmacy last week to get this refilled  [facility] Please advise pt when this has been called in

## 2015-09-14 NOTE — Telephone Encounter (Signed)
Unable to locate request from pharmacy.  Prescription sent.  Patient notified.

## 2015-09-19 ENCOUNTER — Telehealth: Payer: Self-pay | Admitting: *Deleted

## 2015-09-19 NOTE — Telephone Encounter (Signed)
Patient had a deep cleaning today at the dentist.  She was given a Tylenol/Advil regimen for pain.  Patient is taking both warfarin and aspirin and had questions regarding this.  Patient advised to avoid ibuprofen products including Advil.  Okay to take 1000 mg of Tylenol q4-6 hours if needed for pain, not to exceed more than 4 times (4000 mg) in one day.  Patient verbalized understanding and will call the office if this is not helpful.  She also states she will only take the Tylenol if it is necessary.

## 2015-09-22 ENCOUNTER — Ambulatory Visit (INDEPENDENT_AMBULATORY_CARE_PROVIDER_SITE_OTHER): Payer: Medicare Other | Admitting: *Deleted

## 2015-09-22 DIAGNOSIS — Z7901 Long term (current) use of anticoagulants: Secondary | ICD-10-CM | POA: Diagnosis not present

## 2015-09-22 DIAGNOSIS — Z5181 Encounter for therapeutic drug level monitoring: Secondary | ICD-10-CM

## 2015-09-22 LAB — POCT INR: INR: 2.43

## 2015-09-22 NOTE — Progress Notes (Signed)
Pre visit review using our clinic review tool, if applicable. No additional management support is needed unless otherwise documented below in the visit note. 

## 2015-09-29 DIAGNOSIS — Z23 Encounter for immunization: Secondary | ICD-10-CM | POA: Diagnosis not present

## 2015-10-10 ENCOUNTER — Other Ambulatory Visit: Payer: Self-pay | Admitting: Family Medicine

## 2015-10-24 ENCOUNTER — Encounter: Payer: Self-pay | Admitting: Family Medicine

## 2015-10-24 ENCOUNTER — Ambulatory Visit (INDEPENDENT_AMBULATORY_CARE_PROVIDER_SITE_OTHER): Payer: Medicare Other | Admitting: *Deleted

## 2015-10-24 DIAGNOSIS — Z5181 Encounter for therapeutic drug level monitoring: Secondary | ICD-10-CM

## 2015-10-24 DIAGNOSIS — Z7901 Long term (current) use of anticoagulants: Secondary | ICD-10-CM | POA: Diagnosis not present

## 2015-10-24 LAB — POCT INR: INR: 2.24

## 2015-10-24 LAB — PROTIME-INR

## 2015-10-25 NOTE — Progress Notes (Signed)
Pre visit review using our clinic review tool, if applicable. No additional management support is needed unless otherwise documented below in the visit note. 

## 2015-11-02 DIAGNOSIS — H40053 Ocular hypertension, bilateral: Secondary | ICD-10-CM | POA: Diagnosis not present

## 2015-11-12 ENCOUNTER — Other Ambulatory Visit: Payer: Self-pay | Admitting: Family Medicine

## 2015-11-17 ENCOUNTER — Encounter: Payer: Self-pay | Admitting: Family Medicine

## 2015-11-17 ENCOUNTER — Ambulatory Visit (INDEPENDENT_AMBULATORY_CARE_PROVIDER_SITE_OTHER): Payer: Medicare Other | Admitting: *Deleted

## 2015-11-17 DIAGNOSIS — Z7901 Long term (current) use of anticoagulants: Secondary | ICD-10-CM | POA: Diagnosis not present

## 2015-11-17 DIAGNOSIS — Z5181 Encounter for therapeutic drug level monitoring: Secondary | ICD-10-CM

## 2015-11-17 LAB — POCT INR: INR: 2.79

## 2015-11-17 LAB — PROTIME-INR

## 2015-11-21 ENCOUNTER — Encounter: Payer: Self-pay | Admitting: Podiatry

## 2015-11-21 ENCOUNTER — Ambulatory Visit (INDEPENDENT_AMBULATORY_CARE_PROVIDER_SITE_OTHER): Payer: Medicare Other | Admitting: Podiatry

## 2015-11-21 ENCOUNTER — Ambulatory Visit: Payer: Medicare Other

## 2015-11-21 DIAGNOSIS — M79676 Pain in unspecified toe(s): Secondary | ICD-10-CM

## 2015-11-21 DIAGNOSIS — B351 Tinea unguium: Secondary | ICD-10-CM | POA: Diagnosis not present

## 2015-11-21 NOTE — Progress Notes (Signed)
She presents today with chief complaint painful elongated toenails 1 through 5 bilateral.  Objective: Vital signs stable alert and 3 pulses strongly palpable. Toenails are thick yellow dystrophic onychomycotic and painful palpation.  Assessment: Pain in limb secondary to onychomycosis 1 through 5 bilateral.  Plan: Debrided toenails 1 through 5 bilateral covered service secondary to pain.

## 2015-11-21 NOTE — Progress Notes (Signed)
Pre visit review using our clinic review tool, if applicable. No additional management support is needed unless otherwise documented below in the visit note. 

## 2015-12-01 ENCOUNTER — Encounter: Payer: Self-pay | Admitting: *Deleted

## 2015-12-19 ENCOUNTER — Other Ambulatory Visit: Payer: Self-pay | Admitting: *Deleted

## 2015-12-20 MED ORDER — WARFARIN SODIUM 7.5 MG PO TABS: ORAL_TABLET | ORAL | Status: DC

## 2015-12-22 ENCOUNTER — Ambulatory Visit (INDEPENDENT_AMBULATORY_CARE_PROVIDER_SITE_OTHER): Payer: Medicare Other | Admitting: *Deleted

## 2015-12-22 DIAGNOSIS — Z7901 Long term (current) use of anticoagulants: Secondary | ICD-10-CM | POA: Diagnosis not present

## 2015-12-22 DIAGNOSIS — Z5181 Encounter for therapeutic drug level monitoring: Secondary | ICD-10-CM

## 2015-12-22 LAB — POCT INR: INR: 2.03

## 2015-12-22 NOTE — Progress Notes (Signed)
Pre visit review using our clinic review tool, if applicable. No additional management support is needed unless otherwise documented below in the visit note. 

## 2016-01-09 DIAGNOSIS — R42 Dizziness and giddiness: Secondary | ICD-10-CM | POA: Diagnosis not present

## 2016-01-09 DIAGNOSIS — H6123 Impacted cerumen, bilateral: Secondary | ICD-10-CM | POA: Diagnosis not present

## 2016-01-09 DIAGNOSIS — R490 Dysphonia: Secondary | ICD-10-CM | POA: Diagnosis not present

## 2016-01-09 DIAGNOSIS — H90A11 Conductive hearing loss, unilateral, right ear with restricted hearing on the contralateral side: Secondary | ICD-10-CM | POA: Diagnosis not present

## 2016-01-09 DIAGNOSIS — H90A12 Conductive hearing loss, unilateral, left ear with restricted hearing on the contralateral side: Secondary | ICD-10-CM | POA: Diagnosis not present

## 2016-01-19 DIAGNOSIS — Z7901 Long term (current) use of anticoagulants: Secondary | ICD-10-CM | POA: Diagnosis not present

## 2016-01-19 LAB — POCT INR: INR: 2.07

## 2016-01-20 ENCOUNTER — Ambulatory Visit (INDEPENDENT_AMBULATORY_CARE_PROVIDER_SITE_OTHER): Payer: Medicare Other | Admitting: *Deleted

## 2016-01-20 DIAGNOSIS — Z5181 Encounter for therapeutic drug level monitoring: Secondary | ICD-10-CM

## 2016-01-20 NOTE — Progress Notes (Signed)
Pre visit review using our clinic review tool, if applicable. No additional management support is needed unless otherwise documented below in the visit note. 

## 2016-02-16 ENCOUNTER — Other Ambulatory Visit: Payer: Self-pay | Admitting: Family Medicine

## 2016-02-16 DIAGNOSIS — Z7901 Long term (current) use of anticoagulants: Secondary | ICD-10-CM | POA: Diagnosis not present

## 2016-02-16 LAB — POCT INR: INR: 2.3

## 2016-02-20 ENCOUNTER — Ambulatory Visit (INDEPENDENT_AMBULATORY_CARE_PROVIDER_SITE_OTHER): Payer: Medicare Other | Admitting: Podiatry

## 2016-02-20 ENCOUNTER — Encounter: Payer: Self-pay | Admitting: Podiatry

## 2016-02-20 DIAGNOSIS — B351 Tinea unguium: Secondary | ICD-10-CM | POA: Diagnosis not present

## 2016-02-20 DIAGNOSIS — M79676 Pain in unspecified toe(s): Secondary | ICD-10-CM | POA: Diagnosis not present

## 2016-02-20 NOTE — Progress Notes (Signed)
She presents today with a chief complaint of painful elongated toenails.  Objective: vital signs stable alert and oriented 3. Pulses are strongly palpable. Neurologic sensorium is intact. Deep tendon reflexes are intact bilateral muscle strength +5 over 5 dorsiflexion plantar flexors and inverters evertors. Toenails are thick yellow dystrophic onychomycotic and painful on palpation.   Assessment: Pain in limb secondary to onychomycosis.   Plan: Debridement of toenails 1 through 5 bilateral.

## 2016-02-22 ENCOUNTER — Ambulatory Visit (INDEPENDENT_AMBULATORY_CARE_PROVIDER_SITE_OTHER): Payer: Medicare Other | Admitting: *Deleted

## 2016-02-22 DIAGNOSIS — Z5181 Encounter for therapeutic drug level monitoring: Secondary | ICD-10-CM

## 2016-02-22 NOTE — Progress Notes (Signed)
Pre visit review using our clinic review tool, if applicable. No additional management support is needed unless otherwise documented below in the visit note. 

## 2016-03-01 ENCOUNTER — Other Ambulatory Visit: Payer: Self-pay | Admitting: Family Medicine

## 2016-03-15 ENCOUNTER — Ambulatory Visit (INDEPENDENT_AMBULATORY_CARE_PROVIDER_SITE_OTHER): Payer: Medicare Other | Admitting: *Deleted

## 2016-03-15 DIAGNOSIS — Z7901 Long term (current) use of anticoagulants: Secondary | ICD-10-CM | POA: Diagnosis not present

## 2016-03-15 DIAGNOSIS — Z5181 Encounter for therapeutic drug level monitoring: Secondary | ICD-10-CM

## 2016-03-15 LAB — POCT INR: INR: 2.12

## 2016-03-15 NOTE — Progress Notes (Signed)
Pre visit review using our clinic review tool, if applicable. No additional management support is needed unless otherwise documented below in the visit note. 

## 2016-04-10 ENCOUNTER — Ambulatory Visit: Payer: Self-pay | Admitting: Family Medicine

## 2016-04-10 ENCOUNTER — Ambulatory Visit (INDEPENDENT_AMBULATORY_CARE_PROVIDER_SITE_OTHER): Payer: Medicare Other | Admitting: Family Medicine

## 2016-04-10 ENCOUNTER — Encounter: Payer: Self-pay | Admitting: Family Medicine

## 2016-04-10 VITALS — BP 144/86 | HR 76 | Temp 98.3°F | Wt 157.5 lb

## 2016-04-10 DIAGNOSIS — M8580 Other specified disorders of bone density and structure, unspecified site: Secondary | ICD-10-CM

## 2016-04-10 DIAGNOSIS — R5382 Chronic fatigue, unspecified: Secondary | ICD-10-CM | POA: Diagnosis not present

## 2016-04-10 DIAGNOSIS — R569 Unspecified convulsions: Secondary | ICD-10-CM

## 2016-04-10 DIAGNOSIS — I10 Essential (primary) hypertension: Secondary | ICD-10-CM

## 2016-04-10 DIAGNOSIS — R29818 Other symptoms and signs involving the nervous system: Secondary | ICD-10-CM

## 2016-04-10 DIAGNOSIS — K5900 Constipation, unspecified: Secondary | ICD-10-CM

## 2016-04-10 DIAGNOSIS — R2689 Other abnormalities of gait and mobility: Secondary | ICD-10-CM

## 2016-04-10 DIAGNOSIS — G2581 Restless legs syndrome: Secondary | ICD-10-CM

## 2016-04-10 DIAGNOSIS — Z86711 Personal history of pulmonary embolism: Secondary | ICD-10-CM

## 2016-04-10 LAB — CBC WITH DIFFERENTIAL/PLATELET
Basophils Absolute: 0 10*3/uL (ref 0.0–0.1)
Basophils Relative: 0.5 % (ref 0.0–3.0)
EOS PCT: 1.8 % (ref 0.0–5.0)
Eosinophils Absolute: 0.1 10*3/uL (ref 0.0–0.7)
HCT: 41.7 % (ref 36.0–46.0)
HEMOGLOBIN: 14 g/dL (ref 12.0–15.0)
LYMPHS ABS: 1.3 10*3/uL (ref 0.7–4.0)
Lymphocytes Relative: 22 % (ref 12.0–46.0)
MCHC: 33.6 g/dL (ref 30.0–36.0)
MCV: 94.6 fl (ref 78.0–100.0)
MONOS PCT: 8.1 % (ref 3.0–12.0)
Monocytes Absolute: 0.5 10*3/uL (ref 0.1–1.0)
NEUTROS PCT: 67.6 % (ref 43.0–77.0)
Neutro Abs: 4 10*3/uL (ref 1.4–7.7)
Platelets: 180 10*3/uL (ref 150.0–400.0)
RBC: 4.4 Mil/uL (ref 3.87–5.11)
RDW: 13.7 % (ref 11.5–15.5)
WBC: 5.9 10*3/uL (ref 4.0–10.5)

## 2016-04-10 LAB — COMPREHENSIVE METABOLIC PANEL
ALT: 15 U/L (ref 0–35)
AST: 18 U/L (ref 0–37)
Albumin: 4.2 g/dL (ref 3.5–5.2)
Alkaline Phosphatase: 69 U/L (ref 39–117)
BUN: 21 mg/dL (ref 6–23)
CALCIUM: 10 mg/dL (ref 8.4–10.5)
CHLORIDE: 103 meq/L (ref 96–112)
CO2: 28 mEq/L (ref 19–32)
Creatinine, Ser: 0.78 mg/dL (ref 0.40–1.20)
GFR: 75.59 mL/min (ref >60.00)
Glucose, Bld: 91 mg/dL (ref 70–99)
POTASSIUM: 4.2 meq/L (ref 3.5–5.1)
Sodium: 140 mEq/L (ref 135–145)
TOTAL PROTEIN: 7 g/dL (ref 6.0–8.3)
Total Bilirubin: 0.4 mg/dL (ref 0.2–1.2)

## 2016-04-10 LAB — VITAMIN B12: VITAMIN B 12: 289 pg/mL (ref 211–911)

## 2016-04-10 LAB — VITAMIN D 25 HYDROXY (VIT D DEFICIENCY, FRACTURES): VITD: 42.01 ng/mL (ref 30.00–100.00)

## 2016-04-10 LAB — TSH: TSH: 1.27 u[IU]/mL (ref 0.35–4.50)
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
## 2016-04-10 NOTE — Assessment & Plan Note (Signed)
Not on rx but she is followed by neuro.

## 2016-04-10 NOTE — Progress Notes (Signed)
Pre visit review using our clinic review tool, if applicable. No additional management support is needed unless otherwise documented below in the visit note. 

## 2016-04-10 NOTE — Assessment & Plan Note (Signed)
Reasonable control. No changes made today. 

## 2016-04-10 NOTE — Assessment & Plan Note (Signed)
On coumadin 

## 2016-04-10 NOTE — Assessment & Plan Note (Signed)
New but she has noticed this for some time.  She does not think it is progressing. No falls. Will check some blood work today. Advised to continue balance exercises. Keep appt with her neurologist, Dr. Melrose Nakayama, already scheduled.

## 2016-04-11 ENCOUNTER — Encounter: Payer: Self-pay | Admitting: *Deleted

## 2016-04-12 ENCOUNTER — Ambulatory Visit (INDEPENDENT_AMBULATORY_CARE_PROVIDER_SITE_OTHER): Payer: Medicare Other | Admitting: *Deleted

## 2016-04-12 DIAGNOSIS — Z7901 Long term (current) use of anticoagulants: Secondary | ICD-10-CM | POA: Diagnosis not present

## 2016-04-12 DIAGNOSIS — Z5181 Encounter for therapeutic drug level monitoring: Secondary | ICD-10-CM

## 2016-04-12 LAB — POCT INR: INR: 2.62

## 2016-04-12 NOTE — Progress Notes (Signed)
Pre visit review using our clinic review tool, if applicable. No additional management support is needed unless otherwise documented below in the visit note. 

## 2016-04-17 ENCOUNTER — Other Ambulatory Visit: Payer: Self-pay | Admitting: Family Medicine

## 2016-04-17 DIAGNOSIS — Z1231 Encounter for screening mammogram for malignant neoplasm of breast: Secondary | ICD-10-CM

## 2016-04-24 DIAGNOSIS — R270 Ataxia, unspecified: Secondary | ICD-10-CM | POA: Diagnosis not present

## 2016-04-24 DIAGNOSIS — R404 Transient alteration of awareness: Secondary | ICD-10-CM | POA: Diagnosis not present

## 2016-04-24 DIAGNOSIS — R200 Anesthesia of skin: Secondary | ICD-10-CM | POA: Diagnosis not present

## 2016-04-24 DIAGNOSIS — I10 Essential (primary) hypertension: Secondary | ICD-10-CM | POA: Diagnosis not present

## 2016-04-24 DIAGNOSIS — G479 Sleep disorder, unspecified: Secondary | ICD-10-CM | POA: Diagnosis not present

## 2016-04-24 DIAGNOSIS — R42 Dizziness and giddiness: Secondary | ICD-10-CM | POA: Diagnosis not present

## 2016-05-06 ENCOUNTER — Other Ambulatory Visit: Payer: Self-pay | Admitting: Family Medicine

## 2016-05-09 DIAGNOSIS — H6123 Impacted cerumen, bilateral: Secondary | ICD-10-CM | POA: Diagnosis not present

## 2016-05-10 DIAGNOSIS — L821 Other seborrheic keratosis: Secondary | ICD-10-CM | POA: Diagnosis not present

## 2016-05-17 DIAGNOSIS — Z7901 Long term (current) use of anticoagulants: Secondary | ICD-10-CM | POA: Diagnosis not present

## 2016-05-23 ENCOUNTER — Encounter: Payer: Self-pay | Admitting: Podiatry

## 2016-05-23 ENCOUNTER — Ambulatory Visit (INDEPENDENT_AMBULATORY_CARE_PROVIDER_SITE_OTHER): Payer: Medicare Other | Admitting: Podiatry

## 2016-05-23 DIAGNOSIS — M79676 Pain in unspecified toe(s): Secondary | ICD-10-CM | POA: Diagnosis not present

## 2016-05-23 DIAGNOSIS — B351 Tinea unguium: Secondary | ICD-10-CM | POA: Diagnosis not present

## 2016-05-23 NOTE — Progress Notes (Signed)
She presents today with a chief complaint of painful elongated toenails.  Objective: Vital signs are stable alert and oriented 3. Pulses are strongly palpable. Neurologic sensory is intact. Toenails are thick yellow dystrophic onychomycotic severely elongated with sharp incurvated margins. No signs of bacterial infection are noted.  Assessment: Pain and limb secondary to onychomycosis and ingrown nails.  Plan: Debridement of toenails 1 through 5 bilateral covered service secondary to pain. Follow up with her in 3 months.

## 2016-05-25 ENCOUNTER — Other Ambulatory Visit: Payer: Self-pay | Admitting: Family Medicine

## 2016-06-14 DIAGNOSIS — Z7901 Long term (current) use of anticoagulants: Secondary | ICD-10-CM | POA: Diagnosis not present

## 2016-06-14 LAB — PROTIME-INR: INR: 2 — AB (ref 0.9–1.1)

## 2016-06-20 ENCOUNTER — Telehealth: Payer: Self-pay

## 2016-06-20 ENCOUNTER — Other Ambulatory Visit: Payer: Self-pay | Admitting: Family Medicine

## 2016-06-20 ENCOUNTER — Ambulatory Visit
Admission: RE | Admit: 2016-06-20 | Discharge: 2016-06-20 | Disposition: A | Discharge status: Home / Self Care | Payer: Medicare Other | Source: Ambulatory Visit | Attending: Family Medicine | Admitting: Family Medicine

## 2016-06-20 DIAGNOSIS — Z1231 Encounter for screening mammogram for malignant neoplasm of breast: Secondary | ICD-10-CM | POA: Insufficient documentation

## 2016-06-20 NOTE — Telephone Encounter (Signed)
Per incoming fax results of PT INR verbal--Regina Baity states that pt should continue current therapy and repeat in 1 month  Left detailed msg on VM per HIPAA  Lab sent to be scanned

## 2016-07-10 ENCOUNTER — Encounter: Payer: Self-pay | Admitting: Family Medicine

## 2016-07-16 DIAGNOSIS — Z7901 Long term (current) use of anticoagulants: Secondary | ICD-10-CM | POA: Diagnosis not present

## 2016-08-15 ENCOUNTER — Other Ambulatory Visit: Payer: Self-pay | Admitting: Family Medicine

## 2016-08-16 ENCOUNTER — Encounter: Payer: Self-pay | Admitting: Family Medicine

## 2016-08-16 DIAGNOSIS — Z7901 Long term (current) use of anticoagulants: Secondary | ICD-10-CM | POA: Diagnosis not present

## 2016-08-16 LAB — PROTIME-INR

## 2016-08-20 ENCOUNTER — Encounter: Payer: Self-pay | Admitting: Podiatry

## 2016-08-20 ENCOUNTER — Ambulatory Visit (INDEPENDENT_AMBULATORY_CARE_PROVIDER_SITE_OTHER): Payer: Medicare Other | Admitting: Podiatry

## 2016-08-20 DIAGNOSIS — M79676 Pain in unspecified toe(s): Secondary | ICD-10-CM

## 2016-08-20 DIAGNOSIS — B351 Tinea unguium: Secondary | ICD-10-CM

## 2016-08-20 NOTE — Progress Notes (Signed)
She presents today with chief complaint of painful elongated toenails 1 through 5 bilateral.  Objective: Vital signs are stable oriented 3 toenails are thick yellow dystrophic with mycotic pulses remain palpable.  Assessment: Pain limb secondary to onychomycosis.  Plan: Remove toenails 1 through 5 bilateral.

## 2016-08-23 ENCOUNTER — Other Ambulatory Visit: Payer: Self-pay | Admitting: Family Medicine

## 2016-08-27 DIAGNOSIS — I10 Essential (primary) hypertension: Secondary | ICD-10-CM | POA: Diagnosis not present

## 2016-08-27 DIAGNOSIS — R42 Dizziness and giddiness: Secondary | ICD-10-CM | POA: Diagnosis not present

## 2016-08-27 DIAGNOSIS — R200 Anesthesia of skin: Secondary | ICD-10-CM | POA: Diagnosis not present

## 2016-08-27 DIAGNOSIS — G479 Sleep disorder, unspecified: Secondary | ICD-10-CM | POA: Diagnosis not present

## 2016-08-27 DIAGNOSIS — R404 Transient alteration of awareness: Secondary | ICD-10-CM | POA: Diagnosis not present

## 2016-08-27 DIAGNOSIS — R270 Ataxia, unspecified: Secondary | ICD-10-CM | POA: Diagnosis not present

## 2016-09-13 DIAGNOSIS — Z7901 Long term (current) use of anticoagulants: Secondary | ICD-10-CM | POA: Diagnosis not present

## 2016-09-18 DIAGNOSIS — Z1283 Encounter for screening for malignant neoplasm of skin: Secondary | ICD-10-CM | POA: Diagnosis not present

## 2016-09-18 DIAGNOSIS — L304 Erythema intertrigo: Secondary | ICD-10-CM | POA: Diagnosis not present

## 2016-09-18 DIAGNOSIS — Z85820 Personal history of malignant melanoma of skin: Secondary | ICD-10-CM | POA: Diagnosis not present

## 2016-09-18 DIAGNOSIS — L720 Epidermal cyst: Secondary | ICD-10-CM | POA: Diagnosis not present

## 2016-09-18 DIAGNOSIS — D692 Other nonthrombocytopenic purpura: Secondary | ICD-10-CM | POA: Diagnosis not present

## 2016-09-18 DIAGNOSIS — L400 Psoriasis vulgaris: Secondary | ICD-10-CM | POA: Diagnosis not present

## 2016-09-18 DIAGNOSIS — I8393 Asymptomatic varicose veins of bilateral lower extremities: Secondary | ICD-10-CM | POA: Diagnosis not present

## 2016-09-18 DIAGNOSIS — L821 Other seborrheic keratosis: Secondary | ICD-10-CM | POA: Diagnosis not present

## 2016-09-18 DIAGNOSIS — D1800 Hemangioma unspecified site: Secondary | ICD-10-CM | POA: Diagnosis not present

## 2016-09-18 DIAGNOSIS — L853 Xerosis cutis: Secondary | ICD-10-CM | POA: Diagnosis not present

## 2016-09-18 DIAGNOSIS — L859 Epidermal thickening, unspecified: Secondary | ICD-10-CM | POA: Diagnosis not present

## 2016-09-27 DIAGNOSIS — Z23 Encounter for immunization: Secondary | ICD-10-CM | POA: Diagnosis not present

## 2016-10-01 ENCOUNTER — Telehealth: Payer: Self-pay | Admitting: Family Medicine

## 2016-10-01 NOTE — Telephone Encounter (Signed)
Chart has been updated to reflect receiving flu shot

## 2016-10-01 NOTE — Telephone Encounter (Signed)
Patient had her flu shot at [facility] on 09/27/16.

## 2016-10-10 DIAGNOSIS — H93293 Other abnormal auditory perceptions, bilateral: Secondary | ICD-10-CM | POA: Diagnosis not present

## 2016-10-10 DIAGNOSIS — H6123 Impacted cerumen, bilateral: Secondary | ICD-10-CM | POA: Diagnosis not present

## 2016-10-10 DIAGNOSIS — R42 Dizziness and giddiness: Secondary | ICD-10-CM | POA: Diagnosis not present

## 2016-10-11 ENCOUNTER — Encounter: Payer: Self-pay | Admitting: Family Medicine

## 2016-10-11 DIAGNOSIS — Z7901 Long term (current) use of anticoagulants: Secondary | ICD-10-CM | POA: Diagnosis not present

## 2016-10-11 LAB — PROTIME-INR

## 2016-10-18 DIAGNOSIS — Z7901 Long term (current) use of anticoagulants: Secondary | ICD-10-CM | POA: Diagnosis not present

## 2016-10-18 LAB — POCT INR: INR: 2.5

## 2016-11-08 ENCOUNTER — Ambulatory Visit: Payer: Self-pay

## 2016-11-08 DIAGNOSIS — Z5181 Encounter for therapeutic drug level monitoring: Secondary | ICD-10-CM

## 2016-11-08 NOTE — Patient Instructions (Signed)
Pre visit review using our clinic review tool, if applicable. No additional management support is needed unless otherwise documented below in the visit note.   Note:  This is a record of encounter that occurred on 10/19/16.  Information entered for tracking purposes.  Refer to lab scanned in for 10/18/16 result from outside facility.

## 2016-11-15 ENCOUNTER — Ambulatory Visit (INDEPENDENT_AMBULATORY_CARE_PROVIDER_SITE_OTHER): Payer: Medicare Other

## 2016-11-15 DIAGNOSIS — Z5181 Encounter for therapeutic drug level monitoring: Secondary | ICD-10-CM

## 2016-11-15 DIAGNOSIS — Z7901 Long term (current) use of anticoagulants: Secondary | ICD-10-CM | POA: Diagnosis not present

## 2016-11-15 LAB — POCT INR: INR: 2.4

## 2016-11-15 NOTE — Patient Instructions (Signed)
Pre visit review using our clinic review tool, if applicable. No additional management support is needed unless otherwise documented below in the visit note.  Results emailed and faxed in by [facility] facility.  Refer to scanned in report.  INR today:  2.4

## 2016-11-20 ENCOUNTER — Ambulatory Visit (INDEPENDENT_AMBULATORY_CARE_PROVIDER_SITE_OTHER): Payer: Medicare Other | Admitting: Podiatry

## 2016-11-20 ENCOUNTER — Encounter: Payer: Self-pay | Admitting: Podiatry

## 2016-11-20 DIAGNOSIS — L608 Other nail disorders: Secondary | ICD-10-CM

## 2016-11-20 DIAGNOSIS — M13872 Other specified arthritis, left ankle and foot: Secondary | ICD-10-CM

## 2016-11-20 DIAGNOSIS — M79676 Pain in unspecified toe(s): Secondary | ICD-10-CM

## 2016-11-20 DIAGNOSIS — R29898 Other symptoms and signs involving the musculoskeletal system: Secondary | ICD-10-CM | POA: Diagnosis not present

## 2016-11-20 DIAGNOSIS — M79609 Pain in unspecified limb: Secondary | ICD-10-CM

## 2016-11-20 DIAGNOSIS — B351 Tinea unguium: Secondary | ICD-10-CM

## 2016-11-20 DIAGNOSIS — L603 Nail dystrophy: Secondary | ICD-10-CM

## 2016-11-23 ENCOUNTER — Other Ambulatory Visit: Payer: Self-pay | Admitting: Family Medicine

## 2016-11-23 DIAGNOSIS — I10 Essential (primary) hypertension: Secondary | ICD-10-CM

## 2016-11-23 DIAGNOSIS — Z01419 Encounter for gynecological examination (general) (routine) without abnormal findings: Secondary | ICD-10-CM

## 2016-11-29 ENCOUNTER — Ambulatory Visit (INDEPENDENT_AMBULATORY_CARE_PROVIDER_SITE_OTHER): Payer: Medicare Other

## 2016-11-29 VITALS — BP 118/80 | HR 60 | Temp 97.7°F | Ht 63.25 in | Wt 154.5 lb

## 2016-11-29 DIAGNOSIS — Z01419 Encounter for gynecological examination (general) (routine) without abnormal findings: Secondary | ICD-10-CM | POA: Diagnosis not present

## 2016-11-29 DIAGNOSIS — Z0000 Encounter for general adult medical examination without abnormal findings: Secondary | ICD-10-CM

## 2016-11-29 LAB — CBC WITH DIFFERENTIAL/PLATELET
Basophils Absolute: 0 10*3/uL (ref 0.0–0.1)
Basophils Relative: 0.3 % (ref 0.0–3.0)
EOS PCT: 2.1 % (ref 0.0–5.0)
Eosinophils Absolute: 0.1 10*3/uL (ref 0.0–0.7)
HCT: 37.4 % (ref 36.0–46.0)
Hemoglobin: 13 g/dL (ref 12.0–15.0)
LYMPHS ABS: 1.5 10*3/uL (ref 0.7–4.0)
Lymphocytes Relative: 25.8 % (ref 12.0–46.0)
MCHC: 34.7 g/dL (ref 30.0–36.0)
MCV: 94.8 fl (ref 78.0–100.0)
MONO ABS: 0.4 10*3/uL (ref 0.1–1.0)
MONOS PCT: 7.4 % (ref 3.0–12.0)
NEUTROS ABS: 3.7 10*3/uL (ref 1.4–7.7)
NEUTROS PCT: 64.4 % (ref 43.0–77.0)
PLATELETS: 184 10*3/uL (ref 150.0–400.0)
RBC: 3.94 Mil/uL (ref 3.87–5.11)
RDW: 13.1 % (ref 11.5–15.5)
WBC: 5.7 10*3/uL (ref 4.0–10.5)

## 2016-11-29 LAB — COMPREHENSIVE METABOLIC PANEL
ALK PHOS: 70 U/L (ref 39–117)
ALT: 14 U/L (ref 0–35)
AST: 16 U/L (ref 0–37)
Albumin: 3.9 g/dL (ref 3.5–5.2)
BILIRUBIN TOTAL: 0.4 mg/dL (ref 0.2–1.2)
BUN: 17 mg/dL (ref 6–23)
CHLORIDE: 103 meq/L (ref 96–112)
CO2: 33 meq/L — AB (ref 19–32)
CREATININE: 0.73 mg/dL (ref 0.40–1.20)
Calcium: 9.6 mg/dL (ref 8.4–10.5)
GFR: 81.47 mL/min (ref >60.00)
GLUCOSE: 90 mg/dL (ref 70–99)
POTASSIUM: 3.8 meq/L (ref 3.5–5.1)
SODIUM: 139 meq/L (ref 135–145)
Total Protein: 6.5 g/dL (ref 6.0–8.3)

## 2016-11-29 LAB — LIPID PANEL
CHOLESTEROL: 209 mg/dL — AB (ref 0–200)
HDL: 70.9 mg/dL (ref >39.00)
LDL Cholesterol: 120 mg/dL — ABNORMAL HIGH (ref 0–99)
NonHDL: 137.84
TRIGLYCERIDES: 88 mg/dL (ref 0.0–149.0)
Total CHOL/HDL Ratio: 3
VLDL: 17.6 mg/dL (ref 0.0–40.0)

## 2016-11-29 LAB — TSH: TSH: 0.81 u[IU]/mL (ref 0.35–4.50)

## 2016-11-29 NOTE — Progress Notes (Signed)
PCP notes:   Health maintenance:  PCV13 - pt will discuss PNA vaccine with PCP  Abnormal screenings:   Hearing - failed (right ear)  Patient concerns:   None  Nurse concerns:  None  Next PCP appt:   12/05/16 @ 1315

## 2016-11-29 NOTE — Progress Notes (Signed)
Subjective:   Becky Farrell is a 82 y.o. female who presents for an Initial Medicare Annual Wellness Visit.  Review of Systems    N/A  Cardiac Risk Factors include: advanced age (>9men, >27 women);hypertension     Objective:    Today's Vitals   11/29/16 1358  BP: 118/80  Pulse: 60  Temp: 97.7 F (36.5 C)  TempSrc: Oral  SpO2: 97%  Weight: 154 lb 8 oz (70.1 kg)  Height: 5' 3.25" (1.607 m)  PainSc: 0-No pain   Body mass index is 27.15 kg/m.   Current Medications (verified) Outpatient Encounter Prescriptions as of 11/29/2016  Medication Sig  . aspirin 81 MG tablet Take 81 mg by mouth daily.  Marland Kitchen atenolol (TENORMIN) 25 MG tablet TAKE 1 TABLET BY MOUTH EVERY DAY  . Cholecalciferol (VITAMIN D3) 2000 UNITS TABS Take one by mouth daily  . hydrochlorothiazide (MICROZIDE) 12.5 MG capsule TAKE 1 CAPSULE (12.5 MG TOTAL) BY MOUTH DAILY.  Marland Kitchen lisinopril (PRINIVIL,ZESTRIL) 10 MG tablet TAKE 1 TABLET BY MOUTH EVERY DAY  . warfarin (COUMADIN) 5 MG tablet Use as directed.  . warfarin (COUMADIN) 7.5 MG tablet TAKE 1 TABLET EVERY DAY AND AS DIRECTED   No facility-administered encounter medications on file as of 11/29/2016.     Allergies (verified) Clobetasol; Phenergan [promethazine hcl]; and Zithromax [azithromycin]   History: Past Medical History:  Diagnosis Date  . Colon polyps   . History of pulmonary embolism   . Hypertension   . Melanoma ([place]) 2003  . Seizures ([place])    Past Surgical History:  Procedure Laterality Date  . ABDOMINAL HYSTERECTOMY  05/2014  . left hand surgery  05/2011   s/p fall   Family History  Problem Relation Age of Onset  . Heart disease Mother   . Breast cancer Daughter 43   Social History   Occupational History  . Not on file.   Social History Main Topics  . Smoking status: Never Smoker  . Smokeless tobacco: Never Used  . Alcohol use Not on file  . Drug use: Unknown  . Sexual activity: No    Tobacco Counseling Counseling given:  No   Activities of Daily Living In your present state of health, do you have any difficulty performing the following activities: 11/29/2016  Hearing? N  Vision? N  Difficulty concentrating or making decisions? N  Walking or climbing stairs? N  Dressing or bathing? N  Doing errands, shopping? N  Preparing Food and eating ? N  Using the Toilet? N  In the past six months, have you accidently leaked urine? N  Do you have problems with loss of bowel control? N  Managing your Medications? N  Managing your Finances? N  Housekeeping or managing your Housekeeping? N  Some recent data might be hidden    Immunizations and Health Maintenance Immunization History  Administered Date(s) Administered  . Influenza Split 11/02/1999, 12/17/2000, 10/29/2001, 09/14/2002, 08/20/2012  . Influenza,inj,Quad PF,36+ Mos 08/06/2014  . Influenza-Unspecified 08/23/2013, 09/02/2016  . Pneumococcal Polysaccharide-23 12/17/2000  . Tdap 05/18/2011  . Zoster 09/05/2012   There are no preventive care reminders to display for this patient.  Patient Care Team: Lucille Passy, MD as PCP - General (Family Medicine)     Assessment:   This is a routine wellness examination for Becky Farrell.   Hearing/Vision screen  Hearing Screening   125Hz  250Hz  500Hz  1000Hz  2000Hz  3000Hz  4000Hz  6000Hz  8000Hz   Right ear:   40 0 40  0    Left ear:  Comments: Congential hearing loss in left ear  Vision Screening Comments: Last vision exam in Sept 2017  Dietary issues and exercise activities discussed: Current Exercise Habits: Structured exercise class, Type of exercise: stretching;Other - see comments (aerobics), Time (Minutes): 60, Frequency (Times/Week): 5, Weekly Exercise (Minutes/Week): 300, Intensity: Moderate, Exercise limited by: None identified  Goals    . Increase physical activity          Starting 11/29/2016, I will continue to exercise for at least 60 min 5 days per week.       Depression Screen PHQ 2/9  Scores 11/29/2016 11/04/2012  PHQ - 2 Score 0 0    Fall Risk Fall Risk  11/29/2016 11/04/2012  Falls in the past year? No Yes  Number falls in past yr: - (No Data)    Cognitive Function: MMSE - Mini Mental State Exam 11/29/2016  Orientation to time 5  Orientation to Place 5  Registration 3  Attention/ Calculation 0  Recall 3  Language- name 2 objects 0  Language- repeat 1  Language- follow 3 step command 3  Language- read & follow direction 0  Write a sentence 0  Copy design 0  Total score 20     PLEASE NOTE: A Mini-Cog screen was completed. Maximum score is 20. A value of 0 denotes this part of Folstein MMSE was not completed or the patient failed this part of the Mini-Cog screening.   Mini-Cog Screening Orientation to Time - Max 5 pts Orientation to Place - Max 5 pts Registration - Max 3 pts Recall - Max 3 pts Language Repeat - Max 1 pts Language Follow 3 Step Command - Max 3 pts     Screening Tests Health Maintenance  Topic Date Due  . PNA vac Low Risk Adult (1 of 2 - PCV13) 03/02/2017 (Originally 08/12/2001)  . MAMMOGRAM  06/20/2017  . TETANUS/TDAP  05/17/2021  . INFLUENZA VACCINE  Addressed  . DEXA SCAN  Completed  . ZOSTAVAX  Completed      Plan:     I have personally reviewed and addressed the Medicare Annual Wellness questionnaire and have noted the following in the patient's chart:  A. Medical and social history B. Use of alcohol, tobacco or illicit drugs  C. Current medications and supplements D. Functional ability and status E.  Nutritional status F.  Physical activity G. Advance directives H. List of other physicians I.  Hospitalizations, surgeries, and ER visits in previous 12 months J.  [place] to include hearing, vision, cognitive, depression L. Referrals and appointments - none  In addition, I have reviewed and discussed with patient certain preventive protocols, quality metrics, and best practice recommendations. A written  personalized care plan for preventive services as well as general preventive health recommendations were provided to patient.  See attached scanned questionnaire for additional information.   Signed,   Lindell Noe, MHA, BS, LPN Health Coach

## 2016-11-29 NOTE — Progress Notes (Signed)
Pre visit review using our clinic review tool, if applicable. No additional management support is needed unless otherwise documented below in the visit note. 

## 2016-11-29 NOTE — Patient Instructions (Signed)
Becky Farrell , Thank you for taking time to come for your Medicare Wellness Visit. I appreciate your ongoing commitment to your health goals. Please review the following plan we discussed and let me know if I can assist you in the future.   These are the goals we discussed: Goals    . Increase physical activity          Starting 11/29/2016, I will continue to exercise for at least 60 min 5 days per week.        This is a list of the screening recommended for you and due dates:  Health Maintenance  Topic Date Due  . Pneumonia vaccines (1 of 2 - PCV13) 03/02/2017*  . Mammogram  06/20/2017  . Tetanus Vaccine  05/17/2021  . Flu Shot  Addressed  . DEXA scan (bone density measurement)  Completed  . Shingles Vaccine  Completed  *Topic was postponed. The date shown is not the original due date.   Preventive Care for Adults  A healthy lifestyle and preventive care can promote health and wellness. Preventive health guidelines for adults include the following key practices.  . A routine yearly physical is a good way to check with your health care provider about your health and preventive screening. It is a chance to share any concerns and updates on your health and to receive a thorough exam.  . Visit your dentist for a routine exam and preventive care every 6 months. Brush your teeth twice a day and floss once a day. Good oral hygiene prevents tooth decay and gum disease.  . The frequency of eye exams is based on your age, health, family medical history, use  of contact lenses, and other factors. Follow your health care provider's ecommendations for frequency of eye exams.  . Eat a healthy diet. Foods like vegetables, fruits, whole grains, low-fat dairy products, and lean protein foods contain the nutrients you need without too many calories. Decrease your intake of foods high in solid fats, added sugars, and salt. Eat the right amount of calories for you. Get information about a proper diet  from your health care provider, if necessary.  . Regular physical exercise is one of the most important things you can do for your health. Most adults should get at least 150 minutes of moderate-intensity exercise (any activity that increases your heart rate and causes you to sweat) each week. In addition, most adults need muscle-strengthening exercises on 2 or more days a week.  Silver Sneakers may be a benefit available to you. To determine eligibility, you may visit the website: www.silversneakers.com or contact program at 626-730-0517 Mon-Fri between 8AM-8PM.   . Maintain a healthy weight. The body mass index (BMI) is a screening tool to identify possible weight problems. It provides an estimate of body fat based on height and weight. Your health care provider can find your BMI and can help you achieve or maintain a healthy weight.   For adults 20 years and older: ? A BMI below 18.5 is considered underweight. ? A BMI of 18.5 to 24.9 is normal. ? A BMI of 25 to 29.9 is considered overweight. ? A BMI of 30 and above is considered obese.   . Maintain normal blood lipids and cholesterol levels by exercising and minimizing your intake of saturated fat. Eat a balanced diet with plenty of fruit and vegetables. Blood tests for lipids and cholesterol should begin at age 16 and be repeated every 5 years. If your lipid or cholesterol  levels are high, you are over 50, or you are at high risk for heart disease, you Glauser need your cholesterol levels checked more frequently. Ongoing high lipid and cholesterol levels should be treated with medicines if diet and exercise are not working.  . If you smoke, find out from your health care provider how to quit. If you do not use tobacco, please do not start.  . If you choose to drink alcohol, please do not consume more than 2 drinks per day. One drink is considered to be 12 ounces (355 mL) of beer, 5 ounces (148 mL) of wine, or 1.5 ounces (44 mL) of liquor.  .  If you are 89-30 years old, ask your health care provider if you should take aspirin to prevent strokes.  . Use sunscreen. Apply sunscreen liberally and repeatedly throughout the day. You should seek shade when your shadow is shorter than you. Protect yourself by wearing long sleeves, pants, a wide-brimmed hat, and sunglasses year round, whenever you are outdoors.  . Once a month, do a whole body skin exam, using a mirror to look at the skin on your back. Tell your health care provider of new moles, moles that have irregular borders, moles that are larger than a pencil eraser, or moles that have changed in shape or color.

## 2016-11-30 NOTE — Progress Notes (Signed)
   Subjective:    Patient ID: Becky Farrell, female    DOB: [birth year], 82 y.o.   MRN: [id number]  HPI  I reviewed health advisor's note, was available for consultation, and agree with documentation and plan. I was present when this was done but didn't review the note till the next day  Review of Systems     Objective:   Physical Exam        Assessment & Plan:

## 2016-12-01 NOTE — Progress Notes (Signed)
SUBJECTIVE Patient  presents to office today complaining of elongated, thickened nails. Pain while ambulating in shoes. Patient is unable to trim their own nails.  Patient also has a new complaint today of weakness to her left lower extremity. Patient states that she notices the weakness in the morning approximately 15 minutes. After walking for 15 minutes she is able to alleviate her weakness. Patient presents today for further treatment and evaluation  OBJECTIVE General Patient is awake, alert, and oriented x 3 and in no acute distress. Derm Skin is dry and supple bilateral. Negative open lesions or macerations. Remaining integument unremarkable. Nails are tender, long, thickened and dystrophic with subungual debris, consistent with onychomycosis, 1-5 bilateral. No signs of infection noted. Vasc  DP and PT pedal pulses palpable bilaterally. Temperature gradient within normal limits.  Neuro Epicritic and protective threshold sensation diminished bilaterally.  Musculoskeletal Exam No symptomatic pedal deformities noted bilateral. Muscular strength within normal limits.  ASSESSMENT 1. Onychodystrophic nails 1-5 bilateral with hyperkeratosis of nails.  2. Onychomycosis of nail due to dermatophyte bilateral 3. Pain in foot bilateral 4. Generalized arthritis left foot and ankle  PLAN OF CARE 1. Patient evaluated today.  2. Instructed to maintain good pedal hygiene and foot care.  3. Mechanical debridement of nails 1-5 bilaterally performed using a nail nipper. Filed with dremel without incident.  4. Today we discussed the pathology of arthritis, specifically osteoarthritis and degenerative joint disease. Weakness in the left lower extremity is likely due to joint stiffness. 5. Return to clinic in 3 mos.    Edrick Kins, DPM [facility]  Dr. Edrick Kins, [address]                                        [place], [place] [address]                Office [phone]  Fax [phone]

## 2016-12-05 ENCOUNTER — Ambulatory Visit (INDEPENDENT_AMBULATORY_CARE_PROVIDER_SITE_OTHER): Payer: Medicare Other | Admitting: Family Medicine

## 2016-12-05 ENCOUNTER — Ambulatory Visit (INDEPENDENT_AMBULATORY_CARE_PROVIDER_SITE_OTHER)
Admission: RE | Admit: 2016-12-05 | Discharge: 2016-12-05 | Disposition: A | Discharge status: Home / Self Care | Payer: Medicare Other | Source: Ambulatory Visit | Attending: Family Medicine | Admitting: Family Medicine

## 2016-12-05 ENCOUNTER — Encounter: Payer: Self-pay | Admitting: Family Medicine

## 2016-12-05 VITALS — BP 114/60 | HR 64 | Temp 97.7°F | Ht 63.5 in | Wt 156.2 lb

## 2016-12-05 DIAGNOSIS — Z86711 Personal history of pulmonary embolism: Secondary | ICD-10-CM | POA: Diagnosis not present

## 2016-12-05 DIAGNOSIS — J42 Unspecified chronic bronchitis: Secondary | ICD-10-CM | POA: Insufficient documentation

## 2016-12-05 DIAGNOSIS — Z01419 Encounter for gynecological examination (general) (routine) without abnormal findings: Secondary | ICD-10-CM | POA: Insufficient documentation

## 2016-12-05 DIAGNOSIS — I10 Essential (primary) hypertension: Secondary | ICD-10-CM | POA: Diagnosis not present

## 2016-12-05 DIAGNOSIS — Z23 Encounter for immunization: Secondary | ICD-10-CM

## 2016-12-05 DIAGNOSIS — R569 Unspecified convulsions: Secondary | ICD-10-CM

## 2016-12-05 DIAGNOSIS — G2581 Restless legs syndrome: Secondary | ICD-10-CM

## 2016-12-05 MED ORDER — PNEUMOCOCCAL 13-VAL CONJ VACC IM SUSP
0.5000 mL | INTRAMUSCULAR | Status: AC
  Administered 2016-12-05: 0.5 mL via INTRAMUSCULAR

## 2016-12-05 NOTE — Assessment & Plan Note (Signed)
Well controlled on current rxs. No changes made today. 

## 2016-12-05 NOTE — Assessment & Plan Note (Signed)
CXR Today

## 2016-12-05 NOTE — Assessment & Plan Note (Signed)
No recent seizures, off rxs and followed by neuro.

## 2016-12-05 NOTE — Addendum Note (Signed)
Addended by: Modena Nunnery on: 12/05/2016 03:40 PM   Modules accepted: Orders

## 2016-12-05 NOTE — Assessment & Plan Note (Signed)
On coumadin 
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
## 2016-12-05 NOTE — Patient Instructions (Signed)
Great to see you.  Happy New Year!  I will call you with your chest xray results.

## 2016-12-05 NOTE — Assessment & Plan Note (Signed)
Reviewed preventive care protocols, scheduled due services, and updated immunizations Discussed nutrition, exercise, diet, and healthy lifestyle.  

## 2016-12-13 ENCOUNTER — Ambulatory Visit (INDEPENDENT_AMBULATORY_CARE_PROVIDER_SITE_OTHER): Payer: Medicare Other

## 2016-12-13 DIAGNOSIS — Z7901 Long term (current) use of anticoagulants: Secondary | ICD-10-CM | POA: Diagnosis not present

## 2016-12-13 DIAGNOSIS — Z5181 Encounter for therapeutic drug level monitoring: Secondary | ICD-10-CM

## 2016-12-13 LAB — POCT INR: INR: 2.5

## 2016-12-13 NOTE — Patient Instructions (Signed)
Pre visit review using our clinic review tool, if applicable. No additional management support is needed unless otherwise documented below in the visit note. 

## 2017-01-14 ENCOUNTER — Ambulatory Visit: Payer: Self-pay

## 2017-01-14 DIAGNOSIS — Z7901 Long term (current) use of anticoagulants: Secondary | ICD-10-CM | POA: Diagnosis not present

## 2017-01-14 DIAGNOSIS — Z5181 Encounter for therapeutic drug level monitoring: Secondary | ICD-10-CM

## 2017-01-14 LAB — POCT INR: INR: 2

## 2017-02-07 DIAGNOSIS — H6123 Impacted cerumen, bilateral: Secondary | ICD-10-CM | POA: Diagnosis not present

## 2017-02-09 ENCOUNTER — Other Ambulatory Visit: Payer: Self-pay | Admitting: Family Medicine

## 2017-02-14 ENCOUNTER — Ambulatory Visit (INDEPENDENT_AMBULATORY_CARE_PROVIDER_SITE_OTHER): Payer: Medicare Other

## 2017-02-14 ENCOUNTER — Other Ambulatory Visit: Payer: Self-pay | Admitting: Family Medicine

## 2017-02-14 DIAGNOSIS — Z5181 Encounter for therapeutic drug level monitoring: Secondary | ICD-10-CM

## 2017-02-14 LAB — POCT INR: INR: 2.4

## 2017-02-14 NOTE — Patient Instructions (Signed)
Pre visit review using our clinic review tool, if applicable. No additional management support is needed unless otherwise documented below in the visit note. 

## 2017-02-16 ENCOUNTER — Other Ambulatory Visit: Payer: Self-pay | Admitting: Family Medicine

## 2017-02-18 ENCOUNTER — Encounter: Payer: Self-pay | Admitting: Podiatry

## 2017-02-18 ENCOUNTER — Ambulatory Visit (INDEPENDENT_AMBULATORY_CARE_PROVIDER_SITE_OTHER): Payer: Medicare Other | Admitting: Podiatry

## 2017-02-18 DIAGNOSIS — M79609 Pain in unspecified limb: Secondary | ICD-10-CM | POA: Diagnosis not present

## 2017-02-18 DIAGNOSIS — B351 Tinea unguium: Secondary | ICD-10-CM | POA: Diagnosis not present

## 2017-02-18 NOTE — Progress Notes (Signed)
Complaint:  Visit Type: Patient returns to my office for continued preventative foot care services. Complaint: Patient states" my nails have grown long and thick and become painful to walk and wear shoes" . The patient presents for preventative foot care services. No changes to ROS  Podiatric Exam: Vascular: dorsalis pedis and posterior tibial pulses are palpable bilateral. Capillary return is immediate. Temperature gradient is WNL. Skin turgor WNL  Sensorium: Normal Semmes Weinstein monofilament test. Normal tactile sensation bilaterally. Nail Exam: Pt has thick disfigured discolored nails with subungual debris noted bilateral entire nail hallux through fifth toenails Ulcer Exam: There is no evidence of ulcer or pre-ulcerative changes or infection. Orthopedic Exam: Muscle tone and strength are WNL. No limitations in general ROM. No crepitus or effusions noted. Foot type and digits show no abnormalities. Bony prominences are unremarkable. Skin: No Porokeratosis. No infection or ulcers  Diagnosis:  Onychomycosis, , Pain in right toe, pain in left toes  Treatment & Plan Procedures and Treatment: Consent by patient was obtained for treatment procedures. The patient understood the discussion of treatment and procedures well. All questions were answered thoroughly reviewed. Debridement of mycotic and hypertrophic toenails, 1 through 5 bilateral and clearing of subungual debris. No ulceration, no infection noted.  Return Visit-Office Procedure: Patient instructed to return to the office for a follow up visit 3 months   for continued evaluation and treatment.    Jordyne Poehlman DPM 

## 2017-02-28 ENCOUNTER — Ambulatory Visit: Payer: Self-pay | Admitting: Podiatry

## 2017-03-14 DIAGNOSIS — Z7901 Long term (current) use of anticoagulants: Secondary | ICD-10-CM | POA: Diagnosis not present

## 2017-03-14 LAB — POCT INR: INR: 2.4

## 2017-03-15 ENCOUNTER — Ambulatory Visit (INDEPENDENT_AMBULATORY_CARE_PROVIDER_SITE_OTHER): Payer: Medicare Other

## 2017-03-15 DIAGNOSIS — Z5181 Encounter for therapeutic drug level monitoring: Secondary | ICD-10-CM

## 2017-03-15 NOTE — Patient Instructions (Signed)
Pre visit review using our clinic review tool, if applicable. No additional management support is needed unless otherwise documented below in the visit note. 

## 2017-03-18 ENCOUNTER — Other Ambulatory Visit: Payer: Self-pay | Admitting: Family Medicine

## 2017-03-19 NOTE — Telephone Encounter (Signed)
Patient is compliant with coumadin management will approve for #30 with 0 additional refills.

## 2017-04-02 ENCOUNTER — Other Ambulatory Visit: Payer: Self-pay | Admitting: Family Medicine

## 2017-04-11 ENCOUNTER — Ambulatory Visit (INDEPENDENT_AMBULATORY_CARE_PROVIDER_SITE_OTHER): Payer: Medicare Other

## 2017-04-11 DIAGNOSIS — Z7901 Long term (current) use of anticoagulants: Secondary | ICD-10-CM | POA: Diagnosis not present

## 2017-04-11 DIAGNOSIS — Z5181 Encounter for therapeutic drug level monitoring: Secondary | ICD-10-CM

## 2017-04-11 LAB — POCT INR: INR: 2.2

## 2017-04-11 NOTE — Patient Instructions (Signed)
Pre visit review using our clinic review tool, if applicable. No additional management support is needed unless otherwise documented below in the visit note. 

## 2017-04-16 ENCOUNTER — Other Ambulatory Visit: Payer: Self-pay | Admitting: Family Medicine

## 2017-04-16 NOTE — Telephone Encounter (Signed)
Patient is compliant with coumadin management.  Will refill X 3 months per protocol. 

## 2017-05-08 ENCOUNTER — Other Ambulatory Visit: Payer: Self-pay | Admitting: Family Medicine

## 2017-05-09 ENCOUNTER — Ambulatory Visit (INDEPENDENT_AMBULATORY_CARE_PROVIDER_SITE_OTHER): Payer: Medicare Other

## 2017-05-09 DIAGNOSIS — Z5181 Encounter for therapeutic drug level monitoring: Secondary | ICD-10-CM

## 2017-05-09 DIAGNOSIS — Z7901 Long term (current) use of anticoagulants: Secondary | ICD-10-CM | POA: Diagnosis not present

## 2017-05-09 LAB — POCT INR: INR: 2.1

## 2017-05-09 NOTE — Patient Instructions (Signed)
Pre visit review using our clinic review tool, if applicable. No additional management support is needed unless otherwise documented below in the visit note. 

## 2017-05-10 ENCOUNTER — Other Ambulatory Visit: Payer: Self-pay | Admitting: Family Medicine

## 2017-05-10 DIAGNOSIS — Z1231 Encounter for screening mammogram for malignant neoplasm of breast: Secondary | ICD-10-CM

## 2017-05-27 ENCOUNTER — Encounter: Payer: Self-pay | Admitting: Podiatry

## 2017-05-27 ENCOUNTER — Ambulatory Visit (INDEPENDENT_AMBULATORY_CARE_PROVIDER_SITE_OTHER): Payer: Medicare Other | Admitting: Podiatry

## 2017-05-27 DIAGNOSIS — B351 Tinea unguium: Secondary | ICD-10-CM

## 2017-05-27 DIAGNOSIS — M79609 Pain in unspecified limb: Secondary | ICD-10-CM

## 2017-05-27 NOTE — Progress Notes (Signed)
Complaint:  Visit Type: Patient returns to my office for continued preventative foot care services. Complaint: Patient states" my nails have grown long and thick and become painful to walk and wear shoes" . The patient presents for preventative foot care services. No changes to ROS  Podiatric Exam: Vascular: dorsalis pedis and posterior tibial pulses are palpable bilateral. Capillary return is immediate. Temperature gradient is WNL. Skin turgor WNL  Sensorium: Normal Semmes Weinstein monofilament test. Normal tactile sensation bilaterally. Nail Exam: Pt has thick disfigured discolored nails with subungual debris noted bilateral entire nail hallux through fifth toenails Ulcer Exam: There is no evidence of ulcer or pre-ulcerative changes or infection. Orthopedic Exam: Muscle tone and strength are WNL. No limitations in general ROM. No crepitus or effusions noted. Foot type and digits show no abnormalities. Bony prominences are unremarkable. Skin: No Porokeratosis. No infection or ulcers  Diagnosis:  Onychomycosis, , Pain in right toe, pain in left toes  Treatment & Plan Procedures and Treatment: Consent by patient was obtained for treatment procedures. The patient understood the discussion of treatment and procedures well. All questions were answered thoroughly reviewed. Debridement of mycotic and hypertrophic toenails, 1 through 5 bilateral and clearing of subungual debris. No ulceration, no infection noted.  Return Visit-Office Procedure: Patient instructed to return to the office for a follow up visit 4 months   for continued evaluation and treatment.    Jarry Manon DPM 

## 2017-06-13 ENCOUNTER — Ambulatory Visit (INDEPENDENT_AMBULATORY_CARE_PROVIDER_SITE_OTHER): Payer: Medicare Other

## 2017-06-13 DIAGNOSIS — Z7901 Long term (current) use of anticoagulants: Secondary | ICD-10-CM | POA: Diagnosis not present

## 2017-06-13 DIAGNOSIS — Z5181 Encounter for therapeutic drug level monitoring: Secondary | ICD-10-CM

## 2017-06-13 LAB — POCT INR: INR: 2.5

## 2017-06-13 NOTE — Patient Instructions (Signed)
Pre visit review using our clinic review tool, if applicable. No additional management support is needed unless otherwise documented below in the visit note. 

## 2017-06-24 DIAGNOSIS — H903 Sensorineural hearing loss, bilateral: Secondary | ICD-10-CM | POA: Diagnosis not present

## 2017-06-24 DIAGNOSIS — H6123 Impacted cerumen, bilateral: Secondary | ICD-10-CM | POA: Diagnosis not present

## 2017-07-06 ENCOUNTER — Emergency Department: Payer: Medicare Other

## 2017-07-06 ENCOUNTER — Observation Stay: Payer: Medicare Other

## 2017-07-06 ENCOUNTER — Observation Stay
Admission: EM | Admit: 2017-07-06 | Discharge: 2017-07-07 | Disposition: A | Discharge status: Home / Self Care | Payer: Medicare Other | Attending: Internal Medicine | Admitting: Internal Medicine

## 2017-07-06 DIAGNOSIS — R270 Ataxia, unspecified: Secondary | ICD-10-CM | POA: Diagnosis not present

## 2017-07-06 DIAGNOSIS — I639 Cerebral infarction, unspecified: Secondary | ICD-10-CM | POA: Diagnosis present

## 2017-07-06 DIAGNOSIS — M6281 Muscle weakness (generalized): Secondary | ICD-10-CM | POA: Diagnosis not present

## 2017-07-06 DIAGNOSIS — R4182 Altered mental status, unspecified: Secondary | ICD-10-CM | POA: Diagnosis not present

## 2017-07-06 DIAGNOSIS — I10 Essential (primary) hypertension: Secondary | ICD-10-CM | POA: Diagnosis not present

## 2017-07-06 DIAGNOSIS — G40909 Epilepsy, unspecified, not intractable, without status epilepticus: Secondary | ICD-10-CM | POA: Insufficient documentation

## 2017-07-06 DIAGNOSIS — R471 Dysarthria and anarthria: Secondary | ICD-10-CM

## 2017-07-06 DIAGNOSIS — Z86711 Personal history of pulmonary embolism: Secondary | ICD-10-CM | POA: Diagnosis not present

## 2017-07-06 DIAGNOSIS — M19031 Primary osteoarthritis, right wrist: Secondary | ICD-10-CM | POA: Diagnosis not present

## 2017-07-06 DIAGNOSIS — R531 Weakness: Secondary | ICD-10-CM | POA: Diagnosis not present

## 2017-07-06 DIAGNOSIS — M8580 Other specified disorders of bone density and structure, unspecified site: Secondary | ICD-10-CM | POA: Insufficient documentation

## 2017-07-06 DIAGNOSIS — I082 Rheumatic disorders of both aortic and tricuspid valves: Secondary | ICD-10-CM | POA: Diagnosis not present

## 2017-07-06 DIAGNOSIS — G2581 Restless legs syndrome: Secondary | ICD-10-CM | POA: Diagnosis not present

## 2017-07-06 DIAGNOSIS — M25473 Effusion, unspecified ankle: Secondary | ICD-10-CM

## 2017-07-06 DIAGNOSIS — J42 Unspecified chronic bronchitis: Secondary | ICD-10-CM | POA: Insufficient documentation

## 2017-07-06 DIAGNOSIS — Z7901 Long term (current) use of anticoagulants: Secondary | ICD-10-CM | POA: Diagnosis not present

## 2017-07-06 DIAGNOSIS — M25579 Pain in unspecified ankle and joints of unspecified foot: Secondary | ICD-10-CM

## 2017-07-06 DIAGNOSIS — Z7982 Long term (current) use of aspirin: Secondary | ICD-10-CM | POA: Insufficient documentation

## 2017-07-06 DIAGNOSIS — R4781 Slurred speech: Secondary | ICD-10-CM | POA: Diagnosis not present

## 2017-07-06 DIAGNOSIS — R29898 Other symptoms and signs involving the musculoskeletal system: Secondary | ICD-10-CM

## 2017-07-06 DIAGNOSIS — R569 Unspecified convulsions: Secondary | ICD-10-CM | POA: Diagnosis not present

## 2017-07-06 DIAGNOSIS — G459 Transient cerebral ischemic attack, unspecified: Principal | ICD-10-CM | POA: Diagnosis present

## 2017-07-06 LAB — COMPREHENSIVE METABOLIC PANEL
ALT: 15 U/L (ref 14–54)
AST: 22 U/L (ref 15–41)
Albumin: 4.2 g/dL (ref 3.5–5.0)
Alkaline Phosphatase: 66 U/L (ref 38–126)
Anion gap: 10 (ref 5–15)
BUN: 21 mg/dL — ABNORMAL HIGH (ref 6–20)
CALCIUM: 9.9 mg/dL (ref 8.9–10.3)
CO2: 23 mmol/L (ref 22–32)
CREATININE: 0.84 mg/dL (ref 0.44–1.00)
Chloride: 104 mmol/L (ref 101–111)
Glucose, Bld: 94 mg/dL (ref 65–99)
Potassium: 4.1 mmol/L (ref 3.5–5.1)
Sodium: 137 mmol/L (ref 135–145)
Total Bilirubin: 0.7 mg/dL (ref 0.3–1.2)
Total Protein: 7.1 g/dL (ref 6.5–8.1)

## 2017-07-06 LAB — URINALYSIS, COMPLETE (UACMP) WITH MICROSCOPIC
BILIRUBIN URINE: NEGATIVE
Bacteria, UA: NONE SEEN
Glucose, UA: NEGATIVE mg/dL
Hgb urine dipstick: NEGATIVE
KETONES UR: 5 mg/dL — AB
Nitrite: NEGATIVE
PH: 6 (ref 5.0–8.0)
PROTEIN: NEGATIVE mg/dL
Specific Gravity, Urine: 1.006 (ref 1.005–1.030)

## 2017-07-06 LAB — CBC
HCT: 40.4 % (ref 35.0–47.0)
Hemoglobin: 13.7 g/dL (ref 12.0–16.0)
MCH: 32.1 pg (ref 26.0–34.0)
MCHC: 34.1 g/dL (ref 32.0–36.0)
MCV: 94.4 fL (ref 80.0–100.0)
PLATELETS: 159 10*3/uL (ref 150–440)
RBC: 4.28 MIL/uL (ref 3.80–5.20)
RDW: 13.5 % (ref 11.5–14.5)
WBC: 9.5 10*3/uL (ref 3.6–11.0)

## 2017-07-06 LAB — PROTIME-INR
INR: 3.25
Prothrombin Time: 33.9 seconds — ABNORMAL HIGH (ref 11.4–15.2)

## 2017-07-06 LAB — APTT: aPTT: 42 seconds — ABNORMAL HIGH (ref 24–36)

## 2017-07-06 LAB — TROPONIN I: Troponin I: <0.03 ng/mL (ref <0.03)

## 2017-07-06 LAB — GLUCOSE, CAPILLARY: GLUCOSE-CAPILLARY: 83 mg/dL (ref 65–99)

## 2017-07-06 MED ORDER — ASPIRIN 81 MG PO CHEW
324.0000 mg | CHEWABLE_TABLET | Freq: Once | ORAL | Status: AC
  Administered 2017-07-06: 324 mg via ORAL
  Filled 2017-07-06: qty 4

## 2017-07-06 MED ORDER — SENNOSIDES-DOCUSATE SODIUM 8.6-50 MG PO TABS: 1.0000 | ORAL_TABLET | Freq: Every evening | ORAL | Status: DC | PRN

## 2017-07-06 MED ORDER — SODIUM CHLORIDE 0.9 % IV SOLN
INTRAVENOUS | Status: DC
  Administered 2017-07-06: 18:00:00 via INTRAVENOUS

## 2017-07-06 MED ORDER — STROKE: EARLY STAGES OF RECOVERY BOOK
Freq: Once | Status: AC
  Administered 2017-07-06: 18:00:00

## 2017-07-06 MED ORDER — WARFARIN SODIUM 5 MG PO TABS: 5.0000 mg | ORAL_TABLET | ORAL | Status: DC

## 2017-07-06 MED ORDER — ASPIRIN EC 81 MG PO TBEC
81.0000 mg | DELAYED_RELEASE_TABLET | Freq: Every day | ORAL | Status: DC
  Administered 2017-07-07: 81 mg via ORAL
  Filled 2017-07-06: qty 1

## 2017-07-06 MED ORDER — WARFARIN - PHARMACIST DOSING INPATIENT
Freq: Every day | Status: DC
Start: 2017-07-07 — End: 2017-07-07

## 2017-07-06 MED ORDER — WARFARIN SODIUM 5 MG PO TABS
7.5000 mg | ORAL_TABLET | ORAL | Status: DC
  Filled 2017-07-06: qty 1

## 2017-07-06 MED ORDER — ACETAMINOPHEN 325 MG PO TABS: 650.0000 mg | ORAL_TABLET | ORAL | Status: DC | PRN

## 2017-07-06 MED ORDER — ACETAMINOPHEN 160 MG/5ML PO SOLN
650.0000 mg | ORAL | Status: DC | PRN
  Filled 2017-07-06: qty 20.3

## 2017-07-06 MED ORDER — ACETAMINOPHEN 650 MG RE SUPP: 650.0000 mg | RECTAL | Status: DC | PRN

## 2017-07-06 MED ORDER — WARFARIN SODIUM 2.5 MG PO TABS: 3.7500 mg | ORAL_TABLET | ORAL | Status: DC

## 2017-07-06 NOTE — ED Notes (Signed)
Called to give report, floor stated pager did not go off, reviewing bed now.

## 2017-07-06 NOTE — Progress Notes (Signed)
ANTICOAGULATION CONSULT NOTE - Initial Consult  Pharmacy Consult for VKA Indication: History of PE  Allergies  Allergen Reactions  . Clobetasol Other (See Comments)    vertigo  . Clobetasol Propionate     This is ointment form  . Dextromethorphan     promethazine with DM, rapid hearbeat  . Phenergan [Promethazine Hcl]     ? Rapid heart rate  . Promethazine Other (See Comments)  . Zithromax [Azithromycin] Palpitations    Per OSH report, pt unable to recall ? Rapid heart rate    Patient Measurements:   Heparin Dosing Weight:   Vital Signs: Temp: 98 F (36.7 C) (08/04 1753) Temp Source: Oral (08/04 1753) BP: 154/94 (08/04 1753) Pulse Rate: 62 (08/04 1753)  Labs:  Recent Labs  07/06/17 1306  HGB 13.7  HCT 40.4  PLT 159  APTT 42*  LABPROT 33.9*  INR 3.25  CREATININE 0.84  TROPONINI <0.03    CrCl cannot be calculated (Unknown ideal weight.).   Medical History: Past Medical History:  Diagnosis Date  . Colon polyps   . History of pulmonary embolism   . Hypertension   . Melanoma ([place]) 2003  . Seizures ([place])     Medications:  Infusions:  . sodium chloride 50 mL/hr at 07/06/17 1814  . [START ON 07/07/2017] warfarin (COUMADIN) tablet 7.5 mg      Assessment: 82 yof cc right handed weakness and slurred speech with PMH including history of PE on VKA PTA, HTN, melanoma, seizures. Pharmacy consulted to manage VKA.   Goal of Therapy:  INR 2-3 Monitor platelets by anticoagulation protocol: Yes   Date INR Dose 8/4 3.25 HOLD  Plan:  Hold dose this evening due to supratherapeutic INR. Tomorrow, resume PTA warfarin dosing: 7.5 mg po on Tuesdays, Wednesdays, Thursdays, Saturdays, Sundays; 3.75 mg po on Mondays; 5 mg po on Fridays. Pharmacy will continue to follow and adjust as needed to maintain INR 2 to 3.   Laural Benes, Pharm.D., BCPS Clinical Pharmacist 07/06/2017,6:40 PM

## 2017-07-06 NOTE — H&P (Signed)
Becky Farrell is an 83 y.o. female.   Chief Complaint: Right hand weakness and slurred speech. HPI: This is a 83 year old female who said this morning she noticed that her right wrist was weak and she couldn't play that he had a like she usually does and she felt funny. She also said she noted some slurred speech but didn't want to wake her husband up. She also talks about having trouble walking being wobbly however she's been seeing Dr. Melrose Nakayama for quite some time about this. She has a history of seizure disorder but was weaned off Tegretol about 3-4 years ago and has had no issues since then. She is also on Coumadin for history of PE with recurrence after she was off Coumadin.  Past Medical History:  Diagnosis Date  . Colon polyps   . History of pulmonary embolism   . Hypertension   . Melanoma ([place]) 2003  . Seizures ([place])     Past Surgical History:  Procedure Laterality Date  . ABDOMINAL HYSTERECTOMY  05/2014  . left hand surgery  05/2011   s/p fall    Family History  Problem Relation Age of Onset  . Heart disease Mother   . Breast cancer Daughter 39   Social History:  reports that she has never smoked. She has never used smokeless tobacco. Her alcohol and drug histories are not on file.  Allergies:  Allergies  Allergen Reactions  . Clobetasol Other (See Comments)    vertigo  . Clobetasol Propionate     This is ointment form  . Dextromethorphan     promethazine with DM, rapid hearbeat  . Phenergan [Promethazine Hcl]     ? Rapid heart rate  . Promethazine Other (See Comments)  . Zithromax [Azithromycin] Palpitations    Per OSH report, pt unable to recall ? Rapid heart rate     (Not in a hospital admission)  Results for orders placed or performed during the hospital encounter of 07/06/17 (from the past 48 hour(s))  Comprehensive metabolic panel     Status: Abnormal   Collection Time: 07/06/17  1:06 PM  Result Value Ref Range   Sodium 137 135 - 145 mmol/L   Potassium  4.1 3.5 - 5.1 mmol/L   Chloride 104 101 - 111 mmol/L   CO2 23 22 - 32 mmol/L   Glucose, Bld 94 65 - 99 mg/dL   BUN 21 (H) 6 - 20 mg/dL   Creatinine, Ser 0.84 0.44 - 1.00 mg/dL   Calcium 9.9 8.9 - 10.3 mg/dL   Total Protein 7.1 6.5 - 8.1 g/dL   Albumin 4.2 3.5 - 5.0 g/dL   AST 22 15 - 41 U/L   ALT 15 14 - 54 U/L   Alkaline Phosphatase 66 38 - 126 U/L   Total Bilirubin 0.7 0.3 - 1.2 mg/dL   GFR calc non Af Amer >60 >60 mL/min   GFR calc Af Amer >60 >60 mL/min    Comment: (NOTE) The eGFR has been calculated using the CKD EPI equation. This calculation has not been validated in all clinical situations. eGFR's persistently <60 mL/min signify possible Chronic Kidney Disease.    Anion gap 10 5 - 15  CBC     Status: None   Collection Time: 07/06/17  1:06 PM  Result Value Ref Range   WBC 9.5 3.6 - 11.0 K/uL   RBC 4.28 3.80 - 5.20 MIL/uL   Hemoglobin 13.7 12.0 - 16.0 g/dL   HCT 40.4 35.0 - 47.0 %  MCV 94.4 80.0 - 100.0 fL   MCH 32.1 26.0 - 34.0 pg   MCHC 34.1 32.0 - 36.0 g/dL   RDW 13.5 11.5 - 14.5 %   Platelets 159 150 - 440 K/uL  Troponin I     Status: None   Collection Time: 07/06/17  1:06 PM  Result Value Ref Range   Troponin I <0.03 <0.03 ng/mL  Protime-INR     Status: Abnormal   Collection Time: 07/06/17  1:06 PM  Result Value Ref Range   Prothrombin Time 33.9 (H) 11.4 - 15.2 seconds   INR 3.25   APTT     Status: Abnormal   Collection Time: 07/06/17  1:06 PM  Result Value Ref Range   aPTT 42 (H) 24 - 36 seconds    Comment:        IF BASELINE aPTT IS ELEVATED, SUGGEST PATIENT RISK ASSESSMENT BE USED TO DETERMINE APPROPRIATE ANTICOAGULANT THERAPY.   Glucose, capillary     Status: None   Collection Time: 07/06/17  1:17 PM  Result Value Ref Range   Glucose-Capillary 83 65 - 99 mg/dL   Ct Head Wo Contrast  Result Date: 07/06/2017 CLINICAL DATA:  Acute onset right-sided weakness this morning. Altered level of consciousness. EXAM: CT HEAD WITHOUT CONTRAST TECHNIQUE:  Contiguous axial images were obtained from the base of the skull through the vertex without intravenous contrast. COMPARISON:  None. FINDINGS: Brain: No evidence of acute infarction, hemorrhage, hydrocephalus, extra-axial collection, or mass lesion/mass effect. Mild diffuse cerebral atrophy. Vascular:  No hyperdense vessel or other acute findings. Skull: No evidence of fracture or other significant bone abnormality. Sinuses/Orbits:  No acute findings. Other: None. IMPRESSION: No acute intracranial abnormality.  Mild cerebral atrophy. Electronically Signed   By: Earle Gell M.D.   On: 07/06/2017 12:38    Review of Systems  Constitutional: Negative for fever.  HENT: Negative for hearing loss.   Eyes: Negative for blurred vision.  Respiratory: Negative for shortness of breath.   Cardiovascular: Negative for chest pain.  Gastrointestinal: Negative for nausea and vomiting.  Genitourinary: Negative for dysuria.  Musculoskeletal: Negative for joint pain.  Skin: Negative for rash.  Neurological: Positive for dizziness and speech change.    Blood pressure (!) 155/84, pulse 77, temperature 98.1 F (36.7 C), temperature source Oral, resp. rate 16, SpO2 97 %. Physical Exam  Constitutional: She is oriented to person, place, and time. She appears well-developed and well-nourished. No distress.  HENT:  Head: Normocephalic and atraumatic.  Mouth/Throat: Oropharynx is clear and moist. No oropharyngeal exudate.  Eyes: Pupils are equal, round, and reactive to light. EOM are normal. No scleral icterus.  Neck: Normal range of motion. Neck supple. No JVD present. No thyromegaly present.  Cardiovascular: Normal rate.   No murmur heard. Respiratory: Effort normal and breath sounds normal. No respiratory distress. She has no wheezes. She exhibits no tenderness.  GI: Soft. Bowel sounds are normal. She exhibits no distension and no mass.  Musculoskeletal: Normal range of motion. She exhibits no edema.   Neurological: She is alert and oriented to person, place, and time. No cranial nerve deficit.  Skin: Skin is warm and dry.     Assessment/Plan 1. TIA. Patient has symptoms of slurred speech and right hand weakness. Bothseemed to resolve at this point. She was evaluated by telemetry neurology who recommended admission for further workup. CT scan does not show any acute abnormalities. We'll go ahead and admit her for observation get MRI/MRA of the brain, carotid Dopplers  and echocardiogram. She's been started on aspirin here and is also therapeutic on her INR. 2. Ataxia. This is not new for her. She's been seeing Dr. Melrose Nakayama about this for quite some time. 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 Her husband says she does have a lot of concern about the ataxia but it hasn't changed at this point. 3. History of pulmonary embolism. She is currently on Coumadin and therapeutic. Her first clot happened in 2011. She tells me she had a hypercoagulability workup back then that did not show any thing. She was subsequently taken off the Coumadin and had a recurrent clot. We'll continue her Coumadin. 4. Seizure disorder. She's had no episodes of seizures in the past 3 or 4 years after being weaned off her Tegretol by her neurologist.  Total time spent 45 minutes  Baxter Hire, MD 07/06/2017, 3:21 PM

## 2017-07-06 NOTE — Progress Notes (Signed)
Pt down to MRI and xray  per order

## 2017-07-06 NOTE — ED Triage Notes (Signed)
Pt came to ED via pov, sent over from [facility]. Pt reports high blood pressure and slurred speech starting this morning at 0630. Pt speaking in clear sentences but reports she still feels like her speech is slurred. Husband reports does not notice a difference. Has been having balance issues for a while per pt. NP 159/90.

## 2017-07-06 NOTE — ED Provider Notes (Signed)
[facility] Emergency Department Provider Note   ____________________________________________   First MD Initiated Contact with Patient 07/06/17 1219     (approximate)  I have reviewed the triage vital signs and the nursing notes.   HISTORY  Chief Complaint Hypertension and Aphasia   HPI Becky Farrell is a 83 y.o. female here for evaluation of weakness which she noticed in her right hand at about 6:30 this morning  She was going to place the p.m. now, as playing she knows the right hand seemed subtly slow or slightly uncoordinated. Had some trouble playing the pinna that she does not normally experience. She also reports that she felt like her speech has been a little unusual and slightly thick today. Her husband, however reports that her speech seems very normal to him. He did not notice any new symptoms other than she is complaining that her right hand seems slightly weak this morning  No headache. No nausea or vomiting. No chest pain or trouble breathing. Denies any other concern    Past Medical History:  Diagnosis Date  . Colon polyps   . History of pulmonary embolism   . Hypertension   . Melanoma ([place]) 2003  . Seizures [facility]     Patient Active Problem List   Diagnosis Date Noted  . Well woman exam 12/05/2016  . Chronic bronchitis ([place]) 12/05/2016  . Restless leg syndrome 05/16/2015  . Encounter for therapeutic drug monitoring 01/14/2014  . Osteopenia 01/29/2013  . Pessary maintenance 05/13/2012  . Uterine prolapse 05/13/2012  . History of melanoma 05/13/2012  . Hypertension   . Seizures ([place])   . History of pulmonary embolism     Past Surgical History:  Procedure Laterality Date  . ABDOMINAL HYSTERECTOMY  05/2014  . left hand surgery  05/2011   s/p fall    Prior to Admission medications   Medication Sig Start Date End Date Taking? Authorizing Provider  aspirin 81 MG tablet Take 81 mg by mouth daily.   Yes [provider]  atenolol (TENORMIN) 25 MG tablet TAKE 1 TABLET BY MOUTH EVERY DAY 02/14/17  Yes Lucille Passy, MD  Cholecalciferol (VITAMIN D3) 2000 UNITS TABS Take one by mouth daily   Yes [provider]  hydrochlorothiazide (MICROZIDE) 12.5 MG capsule TAKE 1 CAPSULE (12.5 MG TOTAL) BY MOUTH DAILY. 04/02/17  Yes Lucille Passy, MD  lisinopril (PRINIVIL,ZESTRIL) 10 MG tablet TAKE 1 TABLET BY MOUTH EVERY DAY 02/11/17  Yes Lucille Passy, MD  warfarin (COUMADIN) 5 MG tablet TAKE AS DIRECTED 04/16/17  Yes Lucille Passy, MD  warfarin (COUMADIN) 7.5 MG tablet TAKE 1 TABLET EVERY DAY AND AS DIRECTED 05/08/17  Yes Lucille Passy, MD    Allergies Clobetasol; Clobetasol propionate; Dextromethorphan; Phenergan [promethazine hcl]; Promethazine; and Zithromax [azithromycin]  Family History  Problem Relation Age of Onset  . Heart disease Mother   . Breast cancer Daughter 55    Social History Social History  Substance Use Topics  . Smoking status: Never Smoker  . Smokeless tobacco: Never Used  . Alcohol use Not on file    Review of Systems Constitutional: No fever/chills Eyes: No visual changes. ENT: No sore throat. Cardiovascular: Denies chest pain. Respiratory: Denies shortness of breath. Gastrointestinal: No abdominal pain.  No nausea, no vomiting.  No diarrhea.  No constipation. Genitourinary: Negative for dysuria. Musculoskeletal: Negative for back pain. Skin: Negative for rash. Neurological: Negative for headaches or numbness.    ____________________________________________   PHYSICAL  EXAM:  VITAL SIGNS: ED Triage Vitals [07/06/17 1214]  Enc Vitals Group     BP (!) 159/90     Pulse Rate 79     Resp 18     Temp 98.1 F (36.7 C)     Temp Source Oral     SpO2 99 %     Weight      Height      Head Circumference      Peak Flow      Pain Score      Pain Loc      Pain Edu?      Excl. in [place]?     Constitutional: Alert and oriented. Well appearing and in no acute distress. Eyes:  Conjunctivae are normal. Head: Atraumatic. Nose: No congestion/rhinnorhea. Mouth/Throat: Mucous membranes are moist. Neck: No stridor.   Cardiovascular: Normal rate, irregular rhythm. Grossly normal heart sounds.  Good peripheral circulation. Respiratory: Normal respiratory effort.  No retractions. Lungs CTAB. Gastrointestinal: Soft and nontender. No distention. Musculoskeletal: No lower extremity tenderness nor edema. Neurologic:  Normal speech and language. No gross focal neurologic deficits are appreciated.  NIH score equals 0, performed by me at bedside. The patient has no pronator drift. The patient has normal cranial nerve exam. Extraocular movements are normal. Visual fields are normal. Patient has 5 out of 5 strength in all extremities. There is no numbness or gross, acute sensory abnormality in the extremities bilaterally. No speech disturbance. No dysarthria. No aphasia. No ataxia. Normal finger nose finger bilat. Patient speaking in full and clear sentences. Very mild dysdiadochokinesia in the right hand versus the left, but also reports she is left handed.  Right hand Median, ulnar, radial motor intact. Cap refill less than 2 seconds all digits. Strong radial pulse. 5 out of 5 strength throughout the hand intrinsics, flexion and extension at the wrist. No evidence of trauma.  Left hand Median, ulnar, radial motor intact. Cap refill less than 2 seconds all digits. Strong radial pulse. 5 out of 5 strength throughout the hand intrinsics, flexion and extension at the wrist. No evidence of trauma. ____________________________________________     Skin:  Skin is warm, dry and intact. No rash noted. Psychiatric: Mood and affect are normal. Speech and behavior are normal.  ____________________________________________   LABS (all labs ordered are listed, but only abnormal results are displayed)  Labs Reviewed  COMPREHENSIVE METABOLIC PANEL - Abnormal; Notable for the  following:       Result Value   BUN 21 (*)    All other components within normal limits  PROTIME-INR - Abnormal; Notable for the following:    Prothrombin Time 33.9 (*)    All other components within normal limits  APTT - Abnormal; Notable for the following:    aPTT 42 (*)    All other components within normal limits  CBC  TROPONIN I  GLUCOSE, CAPILLARY  URINALYSIS, COMPLETE (UACMP) WITH MICROSCOPIC  CBG MONITORING, ED   ____________________________________________  EKG  Reviewed and interpreted by me at 1415 Heart rate 80 QRS 90 QTc 460 sinus rhythm without evidence of ischemia noted and occasional bigeminy ____________________________________________  RADIOLOGY  Ct Head Wo Contrast  Result Date: 07/06/2017 CLINICAL DATA:  Acute onset right-sided weakness this morning. Altered level of consciousness. EXAM: CT HEAD WITHOUT CONTRAST TECHNIQUE: Contiguous axial images were obtained from the base of the skull through the vertex without intravenous contrast. COMPARISON:  None. FINDINGS: Brain: No evidence of acute infarction, hemorrhage, hydrocephalus, extra-axial collection, or mass lesion/mass  effect. Mild diffuse cerebral atrophy. Vascular:  No hyperdense vessel or other acute findings. Skull: No evidence of fracture or other significant bone abnormality. Sinuses/Orbits:  No acute findings. Other: None. IMPRESSION: No acute intracranial abnormality.  Mild cerebral atrophy. Electronically Signed   By: Earle Gell M.D.   On: 07/06/2017 12:38    ____________________________________________   PROCEDURES  Procedure(s) performed: None  Procedures  Critical Care performed: No  ____________________________________________   INITIAL IMPRESSION / ASSESSMENT AND PLAN / ED COURSE  Pertinent labs & imaging results that were available during my care of the patient were reviewed by me and considered in my medical decision making (see chart for details).  History presents for  evaluation of some difficulty pulling the patient and with the right hand about 6:30 this morning  On exam I find no deficit other than she has some very mild dysdiadochokinesia in the right hand, but she also reports being left handed. Speech seems very clear to me and her husband reports he did not notice any change in her speech or weakness in the hand but she did reported.  She is on Coumadin, we'll check INR. Provide aspirin. I have placed a consult to neurology for further evaluation      ____________________________________________   FINAL CLINICAL IMPRESSION(S) / ED DIAGNOSES  Final diagnoses:  Weakness of right hand  Dysarthria      NEW MEDICATIONS STARTED DURING THIS VISIT:  New Prescriptions   No medications on file     Note:  This document was prepared using Dragon voice recognition software and may include unintentional dictation errors.     Delman Kitten, MD 07/06/17 1440

## 2017-07-07 ENCOUNTER — Other Ambulatory Visit: Payer: Self-pay | Admitting: Internal Medicine

## 2017-07-07 ENCOUNTER — Observation Stay
Admit: 2017-07-07 | Discharge: 2017-07-07 | Disposition: A | Discharge status: Home / Self Care | Payer: Medicare Other | Attending: Internal Medicine | Admitting: Internal Medicine

## 2017-07-07 ENCOUNTER — Observation Stay: Payer: Medicare Other

## 2017-07-07 DIAGNOSIS — R270 Ataxia, unspecified: Secondary | ICD-10-CM | POA: Diagnosis not present

## 2017-07-07 DIAGNOSIS — I638 Other cerebral infarction: Secondary | ICD-10-CM | POA: Diagnosis not present

## 2017-07-07 DIAGNOSIS — R569 Unspecified convulsions: Secondary | ICD-10-CM | POA: Diagnosis not present

## 2017-07-07 DIAGNOSIS — G459 Transient cerebral ischemic attack, unspecified: Secondary | ICD-10-CM | POA: Diagnosis not present

## 2017-07-07 DIAGNOSIS — Z86711 Personal history of pulmonary embolism: Secondary | ICD-10-CM | POA: Diagnosis not present

## 2017-07-07 DIAGNOSIS — I63233 Cerebral infarction due to unspecified occlusion or stenosis of bilateral carotid arteries: Secondary | ICD-10-CM | POA: Diagnosis not present

## 2017-07-07 LAB — PROTIME-INR
INR: 2.92
PROTHROMBIN TIME: 31.1 s — AB (ref 11.4–15.2)

## 2017-07-07 LAB — BASIC METABOLIC PANEL
ANION GAP: 7 (ref 5–15)
BUN: 15 mg/dL (ref 6–20)
CALCIUM: 9.2 mg/dL (ref 8.9–10.3)
CO2: 25 mmol/L (ref 22–32)
Chloride: 107 mmol/L (ref 101–111)
Creatinine, Ser: 0.72 mg/dL (ref 0.44–1.00)
GLUCOSE: 97 mg/dL (ref 65–99)
POTASSIUM: 3.8 mmol/L (ref 3.5–5.1)
SODIUM: 139 mmol/L (ref 135–145)

## 2017-07-07 LAB — LIPID PANEL
Cholesterol: 191 mg/dL (ref 0–200)
HDL: 66 mg/dL (ref >40)
LDL CALC: 117 mg/dL — AB (ref 0–99)
TRIGLYCERIDES: 41 mg/dL (ref <150)
Total CHOL/HDL Ratio: 2.9 RATIO
VLDL: 8 mg/dL (ref 0–40)

## 2017-07-07 LAB — ECHOCARDIOGRAM COMPLETE
Height: 63 in
WEIGHTICAEL: 2425 [oz_av]

## 2017-07-07 MED ORDER — WARFARIN SODIUM 2.5 MG PO TABS
7.5000 mg | ORAL_TABLET | ORAL | Status: DC
  Filled 2017-07-07 (×2): qty 3

## 2017-07-07 MED ORDER — ROSUVASTATIN CALCIUM 10 MG PO TABS: 10.0000 mg | ORAL_TABLET | Freq: Every day | ORAL | 0 refills | Status: DC

## 2017-07-07 MED ORDER — WARFARIN SODIUM 2.5 MG PO TABS: 3.7500 mg | ORAL_TABLET | ORAL | Status: DC

## 2017-07-07 MED ORDER — ROSUVASTATIN CALCIUM 10 MG PO TABS
10.0000 mg | ORAL_TABLET | Freq: Every day | ORAL | Status: DC
  Administered 2017-07-07: 10:00:00 10 mg via ORAL
  Filled 2017-07-07: qty 1

## 2017-07-07 MED ORDER — WARFARIN SODIUM 5 MG PO TABS: 5.0000 mg | ORAL_TABLET | ORAL | Status: DC

## 2017-07-07 NOTE — Progress Notes (Signed)
Home at [place] NAME: Becky Farrell    MR#:  [id number]  DATE OF BIRTH:  1934/07/15  SUBJECTIVE:seen at bedside.waiting fo r echo for discharge.  CHIEF COMPLAINT:   Chief Complaint  Patient presents with  . Hypertension  . Aphasia    REVIEW OF SYSTEMS:    ROS  Nutrition:  Tolerating Diet: Tolerating PT:      DRUG ALLERGIES:   Allergies  Allergen Reactions  . Clobetasol Other (See Comments)    vertigo  . Clobetasol Propionate     This is ointment form  . Dextromethorphan     promethazine with DM, rapid hearbeat  . Phenergan [Promethazine Hcl]     ? Rapid heart rate  . Promethazine Other (See Comments)  . Zithromax [Azithromycin] Palpitations    Per OSH report, pt unable to recall ? Rapid heart rate    VITALS:  Blood pressure 135/89, pulse 75, temperature 98 F (36.7 C), temperature source Oral, resp. rate 18, height 5\' 3"  (1.6 m), weight 68.7 kg (151 lb 9 oz), SpO2 99 %.  PHYSICAL EXAMINATION:   Physical Exam  GENERAL:  83 y.o.-year-old patient lying in the bed with no acute distress.  EYES: Pupils equal, round, reactive to light and accommodation. No scleral icterus. Extraocular muscles intact.  HEENT: Head atraumatic, normocephalic. Oropharynx and nasopharynx clear.  NECK:  Supple, no jugular venous distention. No thyroid enlargement, no tenderness.  LUNGS: Normal breath sounds bilaterally, no wheezing, rales,rhonchi or crepitation. No use of accessory muscles of respiration.  CARDIOVASCULAR: S1, S2 normal. No murmurs, rubs, or gallops.  ABDOMEN: Soft, nontender, nondistended. Bowel sounds present. No organomegaly or mass.  EXTREMITIES: No pedal edema, cyanosis, or clubbing.  NEUROLOGIC: Cranial nerves II through XII are intact. Muscle strength 5/5 in all extremities. Sensation intact. Gait not checked.  PSYCHIATRIC: The patient is alert and oriented x 3.  SKIN: No obvious rash, lesion, or ulcer.     LABORATORY PANEL:   CBC  Recent Labs Lab 07/06/17 1306  WBC 9.5  HGB 13.7  HCT 40.4  PLT 159   ------------------------------------------------------------------------------------------------------------------  Chemistries   Recent Labs Lab 07/06/17 1306 07/07/17 0459  NA 137 139  K 4.1 3.8  CL 104 107  CO2 23 25  GLUCOSE 94 97  BUN 21* 15  CREATININE 0.84 0.72  CALCIUM 9.9 9.2  AST 22  --   ALT 15  --   ALKPHOS 66  --   BILITOT 0.7  --    ------------------------------------------------------------------------------------------------------------------  Cardiac Enzymes  Recent Labs Lab 07/06/17 1306  TROPONINI <0.03   ------------------------------------------------------------------------------------------------------------------  RADIOLOGY:  Dg Wrist 2 Views Right  Result Date: 07/06/2017 CLINICAL DATA:  83 year old female with right wrist pain and swelling. EXAM: RIGHT WRIST - 2 VIEW COMPARISON:  None. FINDINGS: There is no acute fracture or dislocation. Small amorphus bony densities along the dorsal aspect of the carpus, likely chronic. Than acute fracture is less likely. The bones are osteopenic. There is arthritic changes of the intercarpal joints with narrowing of the joint space between the scaphoid and trapezoid. There is chondrocalcinosis of the triangular fibrocartilage complex. There is mild soft tissue swelling of the dorsum of the wrist. No radiopaque foreign object. IMPRESSION: 1. No definite acute fracture or dislocation. Small amorphous densities along the dorsum of the carpal region, likely chronic. 2. Osteopenia with degenerative changes and chondrocalcinosis of the TFCC. 3. Mild soft tissue swelling of the dorsum of the wrist. Electronically Signed  By: Anner Crete M.D.   On: 07/06/2017 21:19   Ct Head Wo Contrast  Result Date: 07/06/2017 CLINICAL DATA:  Acute onset right-sided weakness this morning. Altered level of consciousness.  EXAM: CT HEAD WITHOUT CONTRAST TECHNIQUE: Contiguous axial images were obtained from the base of the skull through the vertex without intravenous contrast. COMPARISON:  None. FINDINGS: Brain: No evidence of acute infarction, hemorrhage, hydrocephalus, extra-axial collection, or mass lesion/mass effect. Mild diffuse cerebral atrophy. Vascular:  No hyperdense vessel or other acute findings. Skull: No evidence of fracture or other significant bone abnormality. Sinuses/Orbits:  No acute findings. Other: None. IMPRESSION: No acute intracranial abnormality.  Mild cerebral atrophy. Electronically Signed   By: Earle Gell M.D.   On: 07/06/2017 12:38   Mr Brain Wo Contrast  Result Date: 07/06/2017 CLINICAL DATA:  83 year old female with right upper extremity weakness and slurred speech discovered this morning. EXAM: MRI HEAD WITHOUT CONTRAST MRA HEAD WITHOUT CONTRAST TECHNIQUE: Multiplanar, multiecho pulse sequences of the brain and surrounding structures were obtained without intravenous contrast. Angiographic images of the head were obtained using MRA technique without contrast. COMPARISON:  Head CT without contrast 1229 hours today. FINDINGS: MRI HEAD FINDINGS Brain: No restricted diffusion to suggest acute infarction. No midline shift, mass effect, evidence of mass lesion, ventriculomegaly, extra-axial collection or acute intracranial hemorrhage. Cervicomedullary junction and pituitary are within normal limits. No cortical encephalomalacia or chronic cerebral blood products identified. Mild for age nonspecific cerebral white matter T2 and FLAIR hyperintensity, mostly periventricular. Deep gray matter nuclei and the brainstem are normal for age. Tiny inferior right cerebellar chronic lacune on series 13, image 5. Vascular: Major intracranial vascular flow voids are preserved. Skull and upper cervical spine: Chronic cervical spine disc and endplate degeneration. Normal bone marrow signal. Sinuses/Orbits: Normal orbits  soft tissues. Visualized paranasal sinuses and mastoids are stable and well pneumatized. Other: Visible internal auditory structures appear normal. Scalp and face soft tissues appear negative. MRA HEAD FINDINGS Antegrade flow in the posterior circulation with codominant distal vertebral arteries. Patent PICA origins. No distal vertebral stenosis. Patent vertebrobasilar junction. No basilar stenosis. SCA and PCA origins are normal. Right posterior communicating artery is present, the left is diminutive or absent. Bilateral PCA branches are within normal limits. Antegrade flow in both ICA siphons. Mildly tortuous distal cervical ICAs. No siphon stenosis. Normal ophthalmic and right posterior communicating artery origins. Patent carotid termini. Normal MCA and ACA origins. Anterior communicating artery, visible bilateral ACA and bilateral MCA branches are within normal limits. IMPRESSION: 1. No acute intracranial abnormality, and largely unremarkable for age noncontrast MRI appearance of the brain. 2.  Negative intracranial MRA. Electronically Signed   By: Genevie Ann M.D.   On: 07/06/2017 21:25   US Carotid Bilateral (at [facility] And [facility] Only)  Result Date: 07/07/2017 CLINICAL DATA:  Stroke.  Right hand weakness.  Slurred speech. EXAM: BILATERAL CAROTID DUPLEX ULTRASOUND TECHNIQUE: Pearline Cables scale imaging, color Doppler and duplex ultrasound were performed of bilateral carotid and vertebral arteries in the neck. COMPARISON:  None. FINDINGS: Criteria: Quantification of carotid stenosis is based on velocity parameters that correlate the residual internal carotid diameter with NASCET-based stenosis levels, using the diameter of the distal internal carotid lumen as the denominator for stenosis measurement. The following velocity measurements were obtained: RIGHT ICA:  49 cm/sec CCA:  65 cm/sec SYSTOLIC ICA/CCA RATIO:  0.8 DIASTOLIC ICA/CCA RATIO:  0.9 ECA:  53 cm/sec LEFT ICA:  52 cm/sec CCA:  64 cm/sec SYSTOLIC ICA/CCA RATIO:  0.8  DIASTOLIC ICA/CCA RATIO:  0.9 ECA:  65 cm/sec RIGHT CAROTID ARTERY: Right common carotid artery is tortuous. Small amount of echogenic plaque at the right carotid bulb. Echogenic plaque at the origin of the external carotid artery. External carotid artery is patent with normal waveform. Normal waveforms and velocities in the internal carotid artery. RIGHT VERTEBRAL ARTERY: Antegrade flow and normal waveform in the right vertebral artery. LEFT CAROTID ARTERY: Small amount of disease at the left carotid bulb. External carotid artery is patent with normal waveform. Intimal thickening in the proximal internal carotid artery. Normal waveforms and velocities in the internal carotid artery. LEFT VERTEBRAL ARTERY: Antegrade flow and normal waveform in the left vertebral artery. IMPRESSION: Mild atherosclerotic disease in the carotid arteries, predominantly at the carotid bulbs. Estimated degree of stenosis in the internal carotid arteries is less than 50% bilaterally. Patent vertebral arteries with antegrade flow. Electronically Signed   By: Markus Daft M.D.   On: 07/07/2017 11:38   Mr Jodene Nam Head/brain OV Cm  Result Date: 07/06/2017 CLINICAL DATA:  83 year old female with right upper extremity weakness and slurred speech discovered this morning. EXAM: MRI HEAD WITHOUT CONTRAST MRA HEAD WITHOUT CONTRAST TECHNIQUE: Multiplanar, multiecho pulse sequences of the brain and surrounding structures were obtained without intravenous contrast. Angiographic images of the head were obtained using MRA technique without contrast. COMPARISON:  Head CT without contrast 1229 hours today. FINDINGS: MRI HEAD FINDINGS Brain: No restricted diffusion to suggest acute infarction. No midline shift, mass effect, evidence of mass lesion, ventriculomegaly, extra-axial collection or acute intracranial hemorrhage. Cervicomedullary junction and pituitary are within normal limits. No cortical encephalomalacia or chronic cerebral blood products  identified. Mild for age nonspecific cerebral white matter T2 and FLAIR hyperintensity, mostly periventricular. Deep gray matter nuclei and the brainstem are normal for age. Tiny inferior right cerebellar chronic lacune on series 13, image 5. Vascular: Major intracranial vascular flow voids are preserved. Skull and upper cervical spine: Chronic cervical spine disc and endplate degeneration. Normal bone marrow signal. Sinuses/Orbits: Normal orbits soft tissues. Visualized paranasal sinuses and mastoids are stable and well pneumatized. Other: Visible internal auditory structures appear normal. Scalp and face soft tissues appear negative. MRA HEAD FINDINGS Antegrade flow in the posterior circulation with codominant distal vertebral arteries. Patent PICA origins. No distal vertebral stenosis. Patent vertebrobasilar junction. No basilar stenosis. SCA and PCA origins are normal. Right posterior communicating artery is present, the left is diminutive or absent. Bilateral PCA branches are within normal limits. Antegrade flow in both ICA siphons. Mildly tortuous distal cervical ICAs. No siphon stenosis. Normal ophthalmic and right posterior communicating artery origins. Patent carotid termini. Normal MCA and ACA origins. Anterior communicating artery, visible bilateral ACA and bilateral MCA branches are within normal limits. IMPRESSION: 1. No acute intracranial abnormality, and largely unremarkable for age noncontrast MRI appearance of the brain. 2.  Negative intracranial MRA. Electronically Signed   By: Genevie Ann M.D.   On: 07/06/2017 21:25     ASSESSMENT AND PLAN:   Active Problems:   TIA (transient ischemic attack)  Right sided weakness/slurred speech;all symptoms resolved.continue ASA,coumadin CT,MRI brain negative for acute stroke. 2..htn;controlled      All the records are reviewed and case discussed with Care Management/Social Workerr. Management plans discussed with the patient, family and they are in  agreement.  CODE STATUS:full  TOTAL TIME TAKING CARE OF THIS PATIENT:35 minutes.   POSSIBLE D/C IN 1-2 DAYS, DEPENDING ON CLINICAL CONDITION.   Epifanio Lesches M.D on 07/07/2017 at 3:55 PM  Between  7am to 6pm - Pager - [phone]  After 6pm go to [email or web address] - password [email or web address] [place] [facility]  Office  [phone]  CC: Primary care physician; Lucille Passy, MD

## 2017-07-07 NOTE — Care Management Obs Status (Signed)
[facility] NOTIFICATION   Patient Details  Name: Becky Farrell MRN: [id number] Date of Birth: [birth year]   Medicare Observation Status Notification Given:  Yes    Timea Breed A, RN 07/07/2017, 2:02 PM

## 2017-07-07 NOTE — Discharge Summary (Signed)
Becky Farrell, is a 83 y.o. female  DOB 1934-04-27  MRN [id number].  Admission date:  07/06/2017  Admitting Physician  Baxter Hire, MD  Discharge Date:  07/07/2017   Primary MD  Lucille Passy, MD  Recommendations for primary care physician for things to follow:   Follow-up with PCP in one week   Admission Diagnosis  Stroke (cerebrum) ([place]) [I63.9] Weakness of right hand [R29.898] Dysarthria [R47.1]   Discharge Diagnosis  Stroke (cerebrum) ([place]) [I63.9] Weakness of right hand [R29.898] Dysarthria [R47.1]    Active Problems:   TIA (transient ischemic attack)      Past Medical History:  Diagnosis Date  . Colon polyps   . History of pulmonary embolism   . Hypertension   . Melanoma ([place]) 2003  . Seizures ([place])     Past Surgical History:  Procedure Laterality Date  . ABDOMINAL HYSTERECTOMY  05/2014  . left hand surgery  05/2011   s/p fall       History of present illness and  Hospital Course:     Kindly see H&P for history of present illness and admission details, please review complete Labs, Consult reports and Test reports for all details in brief  HPI  from the history and physical done on the day of admission 83 year old female patient came in because of weakness of the right hand and slurred speech. And she is admitted for evaluation of possible stroke.   Hospital Course  1 .transient right wrist weakness, slurred speech likely due to TIA: Blood pressure was high at [facility] as per her it was 180/100. Patient CT head unremarkable for acute stroke. MRA of the brain also was negative for acute stroke. MRA of the brain showed no problem in the circulation. Waiting for ultrasound of carotids, echocardiogram.  If they are  within normal range patient can be discharged back to [facility]. LDL is more than  100, discussed this with the patient, started on Crestor. Patient can follow up with her PCP regarding repeat lab function with lipids in 3 months. Continue high protein medications including hydrochlorothiazide, atenolol, lisinopril. It sent weakness, slurred is completely resolved.  #2 .history of PE: INR 2.92. Patient takes 7.5 mg of Coumadin every day and 5 mg on Friday. Continue the home dose.   Discharge Condition: Stable  Follow UP Patient can follow up with Dr. Melrose Nakayama for possible carpal tunnel syndrome of the right hand EMG, NCV     Discharge Instructions  and  Discharge Medications      Allergies as of 07/07/2017      Reactions   Clobetasol Other (See Comments)   vertigo   Clobetasol Propionate    This is ointment form   Dextromethorphan    promethazine with DM, rapid hearbeat   Phenergan [promethazine Hcl]    ? Rapid heart rate   Promethazine Other (See Comments)   Zithromax [azithromycin] Palpitations   Per OSH report, pt unable to recall ? Rapid heart rate      Medication List    TAKE these medications   aspirin 81 MG tablet Take 81 mg by mouth daily.   atenolol 25 MG tablet Commonly known as:  TENORMIN TAKE 1 TABLET BY MOUTH EVERY DAY   hydrochlorothiazide 12.5 MG capsule Commonly known as:  MICROZIDE TAKE 1 CAPSULE (12.5 MG TOTAL) BY MOUTH DAILY.   lisinopril 10 MG tablet Commonly known as:  PRINIVIL,ZESTRIL TAKE 1 TABLET BY MOUTH EVERY DAY   rosuvastatin 10  MG tablet Commonly known as:  CRESTOR Take 1 tablet (10 mg total) by mouth daily.   Vitamin D3 2000 units Tabs Take one by mouth daily   warfarin 5 MG tablet Commonly known as:  COUMADIN TAKE AS DIRECTED   warfarin 7.5 MG tablet Commonly known as:  COUMADIN TAKE 1 TABLET EVERY DAY AND AS DIRECTED         Diet and Activity recommendation: See Discharge Instructions above   Consults obtained -None   Major procedures and Radiology Reports - PLEASE review detailed and final  reports for all details, in brief -      Dg Wrist 2 Views Right  Result Date: 07/06/2017 CLINICAL DATA:  83 year old female with right wrist pain and swelling. EXAM: RIGHT WRIST - 2 VIEW COMPARISON:  None. FINDINGS: There is no acute fracture or dislocation. Small amorphus bony densities along the dorsal aspect of the carpus, likely chronic. Than acute fracture is less likely. The bones are osteopenic. There is arthritic changes of the intercarpal joints with narrowing of the joint space between the scaphoid and trapezoid. There is chondrocalcinosis of the triangular fibrocartilage complex. There is mild soft tissue swelling of the dorsum of the wrist. No radiopaque foreign object. IMPRESSION: 1. No definite acute fracture or dislocation. Small amorphous densities along the dorsum of the carpal region, likely chronic. 2. Osteopenia with degenerative changes and chondrocalcinosis of the TFCC. 3. Mild soft tissue swelling of the dorsum of the wrist. Electronically Signed   By: Anner Crete M.D.   On: 07/06/2017 21:19   Ct Head Wo Contrast  Result Date: 07/06/2017 CLINICAL DATA:  Acute onset right-sided weakness this morning. Altered level of consciousness. EXAM: CT HEAD WITHOUT CONTRAST TECHNIQUE: Contiguous axial images were obtained from the base of the skull through the vertex without intravenous contrast. COMPARISON:  None. FINDINGS: Brain: No evidence of acute infarction, hemorrhage, hydrocephalus, extra-axial collection, or mass lesion/mass effect. Mild diffuse cerebral atrophy. Vascular:  No hyperdense vessel or other acute findings. Skull: No evidence of fracture or other significant bone abnormality. Sinuses/Orbits:  No acute findings. Other: None. IMPRESSION: No acute intracranial abnormality.  Mild cerebral atrophy. Electronically Signed   By: Earle Gell M.D.   On: 07/06/2017 12:38   Mr Brain Wo Contrast  Result Date: 07/06/2017 CLINICAL DATA:  83 year old female with right upper extremity  weakness and slurred speech discovered this morning. EXAM: MRI HEAD WITHOUT CONTRAST MRA HEAD WITHOUT CONTRAST TECHNIQUE: Multiplanar, multiecho pulse sequences of the brain and surrounding structures were obtained without intravenous contrast. Angiographic images of the head were obtained using MRA technique without contrast. COMPARISON:  Head CT without contrast 1229 hours today. FINDINGS: MRI HEAD FINDINGS Brain: No restricted diffusion to suggest acute infarction. No midline shift, mass effect, evidence of mass lesion, ventriculomegaly, extra-axial collection or acute intracranial hemorrhage. Cervicomedullary junction and pituitary are within normal limits. No cortical encephalomalacia or chronic cerebral blood products identified. Mild for age nonspecific cerebral white matter T2 and FLAIR hyperintensity, mostly periventricular. Deep gray matter nuclei and the brainstem are normal for age. Tiny inferior right cerebellar chronic lacune on series 13, image 5. Vascular: Major intracranial vascular flow voids are preserved. Skull and upper cervical spine: Chronic cervical spine disc and endplate degeneration. Normal bone marrow signal. Sinuses/Orbits: Normal orbits soft tissues. Visualized paranasal sinuses and mastoids are stable and well pneumatized. Other: Visible internal auditory structures appear normal. Scalp and face soft tissues appear negative. MRA HEAD FINDINGS Antegrade flow in the posterior circulation with codominant  distal vertebral arteries. Patent PICA origins. No distal vertebral stenosis. Patent vertebrobasilar junction. No basilar stenosis. SCA and PCA origins are normal. Right posterior communicating artery is present, the left is diminutive or absent. Bilateral PCA branches are within normal limits. Antegrade flow in both ICA siphons. Mildly tortuous distal cervical ICAs. No siphon stenosis. Normal ophthalmic and right posterior communicating artery origins. Patent carotid termini. Normal MCA  and ACA origins. Anterior communicating artery, visible bilateral ACA and bilateral MCA branches are within normal limits. IMPRESSION: 1. No acute intracranial abnormality, and largely unremarkable for age noncontrast MRI appearance of the brain. 2.  Negative intracranial MRA. Electronically Signed   By: Genevie Ann M.D.   On: 07/06/2017 21:25   Mr Jodene Nam Head/brain GE Cm  Result Date: 07/06/2017 CLINICAL DATA:  83 year old female with right upper extremity weakness and slurred speech discovered this morning. EXAM: MRI HEAD WITHOUT CONTRAST MRA HEAD WITHOUT CONTRAST TECHNIQUE: Multiplanar, multiecho pulse sequences of the brain and surrounding structures were obtained without intravenous contrast. Angiographic images of the head were obtained using MRA technique without contrast. COMPARISON:  Head CT without contrast 1229 hours today. FINDINGS: MRI HEAD FINDINGS Brain: No restricted diffusion to suggest acute infarction. No midline shift, mass effect, evidence of mass lesion, ventriculomegaly, extra-axial collection or acute intracranial hemorrhage. Cervicomedullary junction and pituitary are within normal limits. No cortical encephalomalacia or chronic cerebral blood products identified. Mild for age nonspecific cerebral white matter T2 and FLAIR hyperintensity, mostly periventricular. Deep gray matter nuclei and the brainstem are normal for age. Tiny inferior right cerebellar chronic lacune on series 13, image 5. Vascular: Major intracranial vascular flow voids are preserved. Skull and upper cervical spine: Chronic cervical spine disc and endplate degeneration. Normal bone marrow signal. Sinuses/Orbits: Normal orbits soft tissues. Visualized paranasal sinuses and mastoids are stable and well pneumatized. Other: Visible internal auditory structures appear normal. Scalp and face soft tissues appear negative. MRA HEAD FINDINGS Antegrade flow in the posterior circulation with codominant distal vertebral arteries. Patent  PICA origins. No distal vertebral stenosis. Patent vertebrobasilar junction. No basilar stenosis. SCA and PCA origins are normal. Right posterior communicating artery is present, the left is diminutive or absent. Bilateral PCA branches are within normal limits. Antegrade flow in both ICA siphons. Mildly tortuous distal cervical ICAs. No siphon stenosis. Normal ophthalmic and right posterior communicating artery origins. Patent carotid termini. Normal MCA and ACA origins. Anterior communicating artery, visible bilateral ACA and bilateral MCA branches are within normal limits. IMPRESSION: 1. No acute intracranial abnormality, and largely unremarkable for age noncontrast MRI appearance of the brain. 2.  Negative intracranial MRA. Electronically Signed   By: Genevie Ann M.D.   On: 07/06/2017 21:25    Micro Results     No results found for this or any previous visit (from the past 240 hour(s)).     Today   Subjective:   Becky Farrell today has No further weakness of the right hand, no slurred speech.  Objective:   Blood pressure 135/78, pulse 66, temperature 97.8 F (36.6 C), temperature source Oral, resp. rate 16, height 5\' 3"  (1.6 m), weight 68.7 kg (151 lb 9 oz), SpO2 96 %.   Intake/Output Summary (Last 24 hours) at 07/07/17 0853 Last data filed at 07/07/17 0345  Gross per 24 hour  Intake           475.83 ml  Output             1925 ml  Net         -  1449.17 ml    Exam Awake Alert, Oriented x 3, No new F.N deficits, Normal affect [place].AT,PERRAL Supple Neck,No JVD, No cervical lymphadenopathy appriciated.  Symmetrical Chest wall movement, Good air movement bilaterally, CTAB RRR,No Gallops,Rubs or new Murmurs, No Parasternal Heave +ve B.Sounds, Abd Soft, Non tender, No organomegaly appriciated, No rebound -guarding or rigidity. No Cyanosis, Clubbing or edema, No new Rash or bruise  Data Review   CBC w Diff:  Lab Results  Component Value Date   WBC 9.5 07/06/2017   HGB 13.7  07/06/2017   HGB 13.8 05/05/2014   HCT 40.4 07/06/2017   HCT 36.4 05/12/2014   PLT 159 07/06/2017   PLT 163 05/05/2014   LYMPHOPCT 25.8 11/29/2016   MONOPCT 7.4 11/29/2016   EOSPCT 2.1 11/29/2016   BASOPCT 0.3 11/29/2016    CMP:  Lab Results  Component Value Date   NA 139 07/07/2017   NA 140 04/22/2013   K 3.8 07/07/2017   K 3.6 04/22/2013   CL 107 07/07/2017   CL 106 04/22/2013   CO2 25 07/07/2017   CO2 29 04/22/2013   BUN 15 07/07/2017   BUN 26 (H) 04/22/2013   CREATININE 0.72 07/07/2017   CREATININE 0.74 01/01/2014   PROT 7.1 07/06/2017   ALBUMIN 4.2 07/06/2017   BILITOT 0.7 07/06/2017   ALKPHOS 66 07/06/2017   AST 22 07/06/2017   ALT 15 07/06/2017  .   Total Time in preparing paper work, data evaluation and todays exam - 53 minutes  Ashwini Jago M.D on 07/07/2017 at 8:53 AM    Note: This dictation was prepared with Dragon dictation along with smaller phrase technology. Any transcriptional errors that result from this process are unintentional.

## 2017-07-07 NOTE — Progress Notes (Signed)
Discharge paperwork reviewed with patient who verbalizes understanding. Prescriptions sent electronically. Patient is steady on her feet. Pt's husband to transport home.

## 2017-07-07 NOTE — Progress Notes (Signed)
Transported out via staff in wheelchair.

## 2017-07-07 NOTE — Evaluation (Signed)
Physical Therapy Evaluation Patient Details Name: Becky Farrell MRN: [id number] DOB: [birth year] Today's Date: 07/07/2017   History of Present Illness  Pt is a 83 yo F, admitted to acute care on 8/4 with TIA. Prior to admission, pt independent with all funcational mobiliyt, amb with no AD. PMH: colon polyps, Hx of PE, HTN, melanoma, and seizures.   Clinical Impression  Pt is pleasant and willing to participate for return to PLOF. Pt performs bed mobility and tranfers with independence, and ambulation with CGA, using no AD. Pt presents with the following deficits: strength and balance.. Overall, pt responded well to today's treatment with no adverse affects. Pt would benefit from skilled PT to address the previously mentioned impairments and promote return to PLOF. Currently recommending outpatient PT, pending d/c.      Follow Up Recommendations Outpatient PT    Equipment Recommendations  None recommended by PT    Recommendations for Other Services       Precautions / Restrictions Precautions Precautions: Fall Restrictions Weight Bearing Restrictions: No      Mobility  Bed Mobility Overal bed mobility: Independent             General bed mobility comments: Pt iindependent with all bed mobility, requiring no cues or assist of balankets/sheets.   Transfers Overall transfer level: Independent Equipment used: None             General transfer comment: Independent with STS, requiring no use of AD or cues for mechancis/safety.   Ambulation/Gait Ambulation/Gait assistance: Min guard Ambulation Distance (Feet): 160 Feet Assistive device: None Gait Pattern/deviations: Step-through pattern     General Gait Details: Pt amb 160 ft with CGA and no rest break. Presents with slight increased postural sway, but is able to self correct. Pt with good reciprocal gait pattern.   Stairs            Wheelchair Mobility    Modified Rankin (Stroke Patients Only)        Balance Overall balance assessment: Needs assistance Sitting-balance support: No upper extremity supported;Feet supported Sitting balance-Leahy Scale: Normal Sitting balance - Comments: Pt with typical seated balance, requiring no cues for mechanics or safety.    Standing balance support: No upper extremity supported Standing balance-Leahy Scale: Good Standing balance comment: Pt with good static stadnign balance, requiring no assistance; Pt requires CGA with dynamic stadning balance, with notable increase in postural sway, but is able to self correct.                              Pertinent Vitals/Pain Pain Assessment: No/denies pain    Home Living Family/patient expects to be discharged to:: Private residence Living Arrangements: Spouse/significant other Available Help at Discharge: Family Type of Home: Independent living facility Home Access: Level entry     Home Layout: Able to live on main level with bedroom/bathroom Home Equipment: Cane - single point Additional Comments: Pt independent with all ALD's prior to admission.     Prior Function Level of Independence: Independent               Hand Dominance        Extremity/Trunk Assessment   Upper Extremity Assessment Upper Extremity Assessment: Generalized weakness (MMT to B UE's grossly 4/5)    Lower Extremity Assessment Lower Extremity Assessment: Generalized weakness (MMT to B LE's grossly 4+/5)       Communication   Communication: No difficulties  Cognition Arousal/Alertness:  Awake/alert Behavior During Therapy: WFL for tasks assessed/performed Overall Cognitive Status: Within Functional Limits for tasks assessed                                        General Comments      Exercises Other Exercises Other Exercises: Seated therex performed to B LE's with supervison x15 reps: marches, LAQ's, and glute sets. Pt requires no cues for mechancis or safety.     Assessment/Plan    PT Assessment Patient needs continued PT services  PT Problem List Decreased strength;Decreased activity tolerance;Decreased balance;Decreased mobility;Decreased coordination       PT Treatment Interventions DME instruction;Gait training;Functional mobility training;Therapeutic activities;Therapeutic exercise;Balance training;Neuromuscular re-education;Patient/family education;Manual techniques    PT Goals (Current goals can be found in the Care Plan section)  Acute Rehab PT Goals Patient Stated Goal: to go home  PT Goal Formulation: With patient Time For Goal Achievement: 07/21/17 Potential to Achieve Goals: Good Additional Goals Additional Goal #1: will perform 10 minisquats with single UE support for greater strength through B LE's and return to PLOF.     Frequency Min 2X/week   Barriers to discharge        Co-evaluation               AM-PAC PT "6 Clicks" Daily Activity  Outcome Measure Difficulty turning over in bed (including adjusting bedclothes, sheets and blankets)?: None Difficulty moving from lying on back to sitting on the side of the bed? : None Difficulty sitting down on and standing up from a chair with arms (e.g., wheelchair, bedside commode, etc,.)?: A Little Help needed moving to and from a bed to chair (including a wheelchair)?: A Little Help needed walking in hospital room?: A Little Help needed climbing 3-5 steps with a railing? : A Lot 6 Click Score: 19    End of Session Equipment Utilized During Treatment: Gait belt Activity Tolerance: Patient tolerated treatment well Patient left: in chair;with call bell/phone within reach;with chair alarm set Nurse Communication: Mobility status PT Visit Diagnosis: Unsteadiness on feet (R26.81);Other abnormalities of gait and mobility (R26.89);Muscle weakness (generalized) (M62.81)    Time: 0941-1000 PT Time Calculation (min) (ACUTE ONLY): 19 min   Charges:   PT Evaluation $PT Eval  Low Complexity: 1 Low PT Treatments $Therapeutic Exercise: 8-22 mins   PT G Codes:   PT G-Codes **NOT FOR INPATIENT CLASS** Functional Assessment Tool Used: AM-PAC 6 Clicks Basic Mobility Functional Limitation: Mobility: Walking and moving around Mobility: Walking and Moving Around Current Status (U3845): At least 20 percent but less than 40 percent impaired, limited or restricted Mobility: Walking and Moving Around Goal Status [id number]): At least 1 percent but less than 20 percent impaired, limited or restricted    Oran Rein PT, SPT   Bevelyn Ngo 07/07/2017, 12:55 PM

## 2017-07-07 NOTE — Progress Notes (Signed)
[place] for VKA Indication: History of PE  Allergies  Allergen Reactions  . Clobetasol Other (See Comments)    vertigo  . Clobetasol Propionate     This is ointment form  . Dextromethorphan     promethazine with DM, rapid hearbeat  . Phenergan [Promethazine Hcl]     ? Rapid heart rate  . Promethazine Other (See Comments)  . Zithromax [Azithromycin] Palpitations    Per OSH report, pt unable to recall ? Rapid heart rate    Patient Measurements: Height: 5\' 3"  (160 cm) Weight: 151 lb 9 oz (68.7 kg) IBW/kg (Calculated) : 52.4 Heparin Dosing Weight:   Vital Signs: Temp: 97.8 F (36.6 C) (08/05 0636) Temp Source: Oral (08/05 0636) BP: 135/78 (08/05 0636) Pulse Rate: 66 (08/05 0636)  Labs:  Recent Labs  07/06/17 1306 07/07/17 0459  HGB 13.7  --   HCT 40.4  --   PLT 159  --   APTT 42*  --   LABPROT 33.9* 31.1*  INR 3.25 2.92  CREATININE 0.84 0.72  TROPONINI <0.03  --     Estimated Creatinine Clearance: 52.2 mL/min (by C-G formula based on SCr of 0.72 mg/dL).   Medical History: Past Medical History:  Diagnosis Date  . Colon polyps   . History of pulmonary embolism   . Hypertension   . Melanoma ([place]) 2003  . Seizures ([place])     Medications:  Infusions:  . sodium chloride 50 mL/hr at 07/06/17 1814  . warfarin (COUMADIN) tablet 7.5 mg      Assessment: 82 yof cc right handed weakness and slurred speech with PMH including history of PE on VKA PTA, HTN, melanoma, seizures. Pharmacy consulted to manage VKA.  History of PE with recurrence after she was off Coumadin. PTA warfarin dosing: 7.5 mg po on Tuesdays, Wednesdays, Thursdays, Saturdays, Sundays; 3.75 mg po on Mondays; 5 mg po on Fridays  Goal of Therapy:  INR 2-3 Monitor platelets by anticoagulation protocol: Yes   Date INR Dose 8/4 3.25 HOLD 8/5 2.92  Plan:  INR therapeutic. Will give Warfarin 7.5 mg po today as on PTA, given hx of PE and possible  TIA/?stroke and reassess in am. Warfarin Currently ordered as per PTA dosing. See anticoag visit notes. Pharmacy will continue to follow and adjust as needed to maintain INR 2 to 3.   Saban Heinlen A, Pharm.D., BCPS Clinical Pharmacist 07/07/2017,8:45 AM

## 2017-07-07 NOTE — Progress Notes (Signed)
A&O pt. Admitted with TIA. NIH score is 1. Pt with right grip weakness and right wrist edema noted. Xray of wrist completed. Pt continues to c/o pain but declines PRN pain medication. Pt requesting to try ice pack. Dr. Estanislado Pandy notified, new order obtained for ice pack to right wrist PRN pain and edema. Pt continues to be NSR on off unit telemetry. Pt is now a high fall risk with yellow arm band on arm and exit alarm is activated. Red allergy band is on patient also.

## 2017-07-08 LAB — HEMOGLOBIN A1C
Hgb A1c MFr Bld: 5.4 % (ref 4.8–5.6)
Mean Plasma Glucose: 108 mg/dL

## 2017-07-09 ENCOUNTER — Ambulatory Visit
Admission: RE | Admit: 2017-07-09 | Discharge: 2017-07-09 | Disposition: A | Discharge status: Home / Self Care | Payer: Medicare Other | Source: Ambulatory Visit | Attending: Family Medicine | Admitting: Family Medicine

## 2017-07-09 DIAGNOSIS — Z1231 Encounter for screening mammogram for malignant neoplasm of breast: Secondary | ICD-10-CM | POA: Diagnosis not present

## 2017-07-11 ENCOUNTER — Ambulatory Visit (INDEPENDENT_AMBULATORY_CARE_PROVIDER_SITE_OTHER): Payer: Medicare Other

## 2017-07-11 DIAGNOSIS — Z5181 Encounter for therapeutic drug level monitoring: Secondary | ICD-10-CM

## 2017-07-11 DIAGNOSIS — Z7901 Long term (current) use of anticoagulants: Secondary | ICD-10-CM | POA: Diagnosis not present

## 2017-07-11 LAB — POCT INR: INR: 1.5

## 2017-07-12 NOTE — Patient Instructions (Signed)
Pre visit review using our clinic review tool, if applicable. No additional management support is needed unless otherwise documented below in the visit note. 

## 2017-07-22 ENCOUNTER — Encounter: Payer: Self-pay | Admitting: Family Medicine

## 2017-07-22 ENCOUNTER — Ambulatory Visit: Payer: Self-pay

## 2017-07-22 DIAGNOSIS — Z5181 Encounter for therapeutic drug level monitoring: Secondary | ICD-10-CM

## 2017-07-22 DIAGNOSIS — Z7901 Long term (current) use of anticoagulants: Secondary | ICD-10-CM | POA: Diagnosis not present

## 2017-07-22 LAB — POCT INR: INR: 3.6

## 2017-07-22 LAB — PROTIME-INR

## 2017-07-22 NOTE — Patient Instructions (Signed)
Pre visit review using our clinic review tool, if applicable. No additional management support is needed unless otherwise documented below in the visit note. 

## 2017-08-01 ENCOUNTER — Encounter: Payer: Self-pay | Admitting: Family Medicine

## 2017-08-01 ENCOUNTER — Ambulatory Visit: Payer: Self-pay

## 2017-08-01 DIAGNOSIS — Z5181 Encounter for therapeutic drug level monitoring: Secondary | ICD-10-CM

## 2017-08-01 DIAGNOSIS — Z7901 Long term (current) use of anticoagulants: Secondary | ICD-10-CM | POA: Diagnosis not present

## 2017-08-01 LAB — POCT INR: INR: 2.4

## 2017-08-01 LAB — PROTIME-INR

## 2017-08-01 NOTE — Patient Instructions (Signed)
Pre visit review using our clinic review tool, if applicable. No additional management support is needed unless otherwise documented below in the visit note. 

## 2017-08-26 ENCOUNTER — Ambulatory Visit (INDEPENDENT_AMBULATORY_CARE_PROVIDER_SITE_OTHER): Payer: Medicare Other | Admitting: Family Medicine

## 2017-08-26 ENCOUNTER — Encounter: Payer: Self-pay | Admitting: Family Medicine

## 2017-08-26 VITALS — BP 122/74 | HR 64 | Temp 98.2°F | Wt 149.8 lb

## 2017-08-26 DIAGNOSIS — Z23 Encounter for immunization: Secondary | ICD-10-CM

## 2017-08-26 DIAGNOSIS — I639 Cerebral infarction, unspecified: Secondary | ICD-10-CM

## 2017-08-26 DIAGNOSIS — G459 Transient cerebral ischemic attack, unspecified: Secondary | ICD-10-CM | POA: Diagnosis not present

## 2017-08-26 NOTE — Progress Notes (Signed)
Subjective:   Patient ID: Becky Farrell, female    DOB: 07-26-1934, 83 y.o.   MRN: [id number]  Becky Farrell is a pleasant 83 y.o. year old female with h/o PE on coumadin, who presents to clinic today with Hospitalization Follow-up  on 08/26/2017  HPI:  Admitted to [facility] 07/06/17-07/07/17 after she presented with weakness of her right hand and dysarthria.  Notes reviewed.  Head CT and MRA of brain were neg.  Felt she had TIA and discharged home with follow up with neurology.  Started on Crestor prior to discharge but she decided not to take it.  Has had no further episodes of weakness or dysarthria.  Lab Results  Component Value Date   CHOL 191 07/07/2017   HDL 66 07/07/2017   LDLCALC 117 (H) 07/07/2017   LDLDIRECT 158.5 11/04/2012   TRIG 41 07/07/2017   CHOLHDL 2.9 07/07/2017      Current Outpatient Prescriptions on File Prior to Visit  Medication Sig Dispense Refill  . aspirin 81 MG tablet Take 81 mg by mouth daily.    Marland Kitchen atenolol (TENORMIN) 25 MG tablet TAKE 1 TABLET BY MOUTH EVERY DAY 90 tablet 3  . Cholecalciferol (VITAMIN D3) 2000 UNITS TABS Take one by mouth daily    . hydrochlorothiazide (MICROZIDE) 12.5 MG capsule TAKE 1 CAPSULE (12.5 MG TOTAL) BY MOUTH DAILY. 90 capsule 2  . lisinopril (PRINIVIL,ZESTRIL) 10 MG tablet TAKE 1 TABLET BY MOUTH EVERY DAY 90 tablet 2  . warfarin (COUMADIN) 5 MG tablet TAKE AS DIRECTED 30 tablet 2  . warfarin (COUMADIN) 7.5 MG tablet TAKE 1 TABLET EVERY DAY AND AS DIRECTED 70 tablet 2   No current facility-administered medications on file prior to visit.     Allergies  Allergen Reactions  . Clobetasol Other (See Comments)    vertigo  . Clobetasol Propionate     This is ointment form  . Dextromethorphan     promethazine with DM, rapid hearbeat  . Phenergan [Promethazine Hcl]     ? Rapid heart rate  . Promethazine Other (See Comments)  . Zithromax [Azithromycin] Palpitations    Per OSH report, pt unable to recall ? Rapid  heart rate    Past Medical History:  Diagnosis Date  . Colon polyps   . History of pulmonary embolism   . Hypertension   . Melanoma ([place]) 2003  . Seizures ([place])     Past Surgical History:  Procedure Laterality Date  . ABDOMINAL HYSTERECTOMY  05/2014  . left hand surgery  05/2011   s/p fall    Family History  Problem Relation Age of Onset  . Heart disease Mother   . Breast cancer Daughter 66    Social History   Social History  . Marital status: Married    Spouse name: N/A  . Number of children: N/A  . Years of education: N/A   Occupational History  . Not on file.   Social History Main Topics  . Smoking status: Never Smoker  . Smokeless tobacco: Never Used  . Alcohol use Not on file  . Drug use: Unknown  . Sexual activity: No   Other Topics Concern  . Not on file   Social History Narrative   Recently moved from [place] to [facility].   Daughter and grandchildren live in [place].   Has other children in [place].   Retired Pharmacist, hospital.      Husband has been recovering from Prostate CA.   The PMH, PSH, Social History,  Family History, Medications, and allergies have been reviewed in [facility], and have been updated if relevant.   Review of Systems  Constitutional: Negative.   Respiratory: Negative.   Cardiovascular: Negative.   Gastrointestinal: Negative.   Musculoskeletal: Negative.   Neurological: Negative.   Hematological: Negative.   Psychiatric/Behavioral: Negative.   All other systems reviewed and are negative.      Objective:    BP 122/74 (BP Location: Right Arm, Patient Position: Sitting, Cuff Size: Normal)   Pulse 64   Temp 98.2 F (36.8 C) (Oral)   Wt 149 lb 12 oz (67.9 kg)   SpO2 97%   BMI 26.53 kg/m    Physical Exam   General:  Well-developed,well-nourished,in no acute distress; alert,appropriate and cooperative throughout examination Head:  normocephalic and atraumatic.   Eyes:  vision grossly intact, PERRL Ears:  R ear normal and L  ear normal externally, TMs clear bilaterally Nose:  no external deformity.   Mouth:  good dentition.   Neck:  No deformities, masses, or tenderness noted. Lungs:  Normal respiratory effort, chest expands symmetrically. Lungs are clear to auscultation, no crackles or wheezes. Heart:  Normal rate and regular rhythm. S1 and S2 normal without gallop, murmur, click, rub or other extra sounds. Msk:  No deformity or scoliosis noted of thoracic or lumbar spine.   Extremities:  No clubbing, cyanosis, edema, or deformity noted with normal full range of motion of all joints.   Neurologic:  alert & oriented X3 and gait normal.   Skin:  Intact without suspicious lesions or rashes Psych:  Cognition and judgment appear intact. Alert and cooperative with normal attention span and concentration. No apparent delusions, illusions, hallucinations       Assessment & Plan:   Transient cerebral ischemia, unspecified type  Need for influenza vaccination - Plan: Flu Vaccine QUAD 6+ mos PF IM (Fluarix Quad PF) No Follow-up on file.

## 2017-08-26 NOTE — Patient Instructions (Signed)
Great to see you.  Happy birthday!   Try [employer] shoes.

## 2017-08-26 NOTE — Assessment & Plan Note (Signed)
Doing well without residual deficits. She wants to hold off on PT for now. The patient indicates understanding of these issues and agrees with the plan.

## 2017-08-29 ENCOUNTER — Ambulatory Visit: Payer: Self-pay

## 2017-08-29 ENCOUNTER — Encounter: Payer: Self-pay | Admitting: Family Medicine

## 2017-08-29 DIAGNOSIS — Z7901 Long term (current) use of anticoagulants: Secondary | ICD-10-CM | POA: Diagnosis not present

## 2017-08-29 DIAGNOSIS — Z5181 Encounter for therapeutic drug level monitoring: Secondary | ICD-10-CM

## 2017-08-29 LAB — POCT INR: INR: 1.8

## 2017-08-29 LAB — PROTIME-INR

## 2017-08-29 NOTE — Patient Instructions (Signed)
Pre visit review using our clinic review tool, if applicable. No additional management support is needed unless otherwise documented below in the visit note. 

## 2017-09-19 ENCOUNTER — Telehealth: Payer: Self-pay

## 2017-09-19 NOTE — Telephone Encounter (Signed)
Becky Farrell reports that the lab tech did not show up this am at [facility].  She would like to know if okay to wait until next week for INR check or if patient should come to the office today for lab?

## 2017-09-19 NOTE — Telephone Encounter (Signed)
LM with Sharee Pimple (nurse receptionist), to inform Becky Farrell that it is okay to recheck this Monday 10/22 at [facility].  But, if lab does not show at that time, will need to schedule to come in here to the office.

## 2017-09-23 ENCOUNTER — Ambulatory Visit: Payer: Self-pay

## 2017-09-23 ENCOUNTER — Encounter: Payer: Self-pay | Admitting: Family Medicine

## 2017-09-23 ENCOUNTER — Ambulatory Visit (INDEPENDENT_AMBULATORY_CARE_PROVIDER_SITE_OTHER): Payer: Medicare Other | Admitting: Podiatry

## 2017-09-23 DIAGNOSIS — Z5181 Encounter for therapeutic drug level monitoring: Secondary | ICD-10-CM

## 2017-09-23 DIAGNOSIS — D689 Coagulation defect, unspecified: Secondary | ICD-10-CM

## 2017-09-23 DIAGNOSIS — Z7901 Long term (current) use of anticoagulants: Secondary | ICD-10-CM | POA: Diagnosis not present

## 2017-09-23 DIAGNOSIS — M79609 Pain in unspecified limb: Secondary | ICD-10-CM | POA: Diagnosis not present

## 2017-09-23 DIAGNOSIS — B351 Tinea unguium: Secondary | ICD-10-CM | POA: Diagnosis not present

## 2017-09-23 LAB — POCT INR: INR: 2.6

## 2017-09-23 LAB — PROTIME-INR

## 2017-09-23 NOTE — Patient Instructions (Signed)
Pre visit review using our clinic review tool, if applicable. No additional management support is needed unless otherwise documented below in the visit note. 

## 2017-09-23 NOTE — Progress Notes (Signed)
Complaint:  Visit Type: Patient returns to my office for continued preventative foot care services. Complaint: Patient states" my nails have grown long and thick and become painful to walk and wear shoes"  The patient presents for preventative foot care services. No changes to ROS.  Patient is taking coumadin.  Podiatric Exam: Vascular: dorsalis pedis and posterior tibial pulses are palpable bilateral. Capillary return is immediate. Temperature gradient is WNL. Skin turgor WNL  Sensorium: Normal Semmes Weinstein monofilament test. Normal tactile sensation bilaterally. Nail Exam: Pt has thick disfigured discolored nails with subungual debris noted bilateral entire nail hallux through fifth toenails Ulcer Exam: There is no evidence of ulcer or pre-ulcerative changes or infection. Orthopedic Exam: Muscle tone and strength are WNL. No limitations in general ROM. No crepitus or effusions noted. Foot type and digits show no abnormalities. Bony prominences are unremarkable. Skin: No Porokeratosis. No infection or ulcers  Diagnosis:  Onychomycosis, , Pain in right toe, pain in left toes  Treatment & Plan Procedures and Treatment: Consent by patient was obtained for treatment procedures. The patient understood the discussion of treatment and procedures well. All questions were answered thoroughly reviewed. Debridement of mycotic and hypertrophic toenails, 1 through 5 bilateral and clearing of subungual debris. No ulceration, no infection noted.  ABN signed for 2018. Return Visit-Office Procedure: Patient instructed to return to the office for a follow up visit 4 months for continued evaluation and treatment.    Gardiner Barefoot DPM

## 2017-09-24 ENCOUNTER — Ambulatory Visit: Payer: Self-pay | Admitting: Internal Medicine

## 2017-09-24 DIAGNOSIS — L821 Other seborrheic keratosis: Secondary | ICD-10-CM | POA: Diagnosis not present

## 2017-09-24 DIAGNOSIS — Z1283 Encounter for screening for malignant neoplasm of skin: Secondary | ICD-10-CM | POA: Diagnosis not present

## 2017-09-24 DIAGNOSIS — I8393 Asymptomatic varicose veins of bilateral lower extremities: Secondary | ICD-10-CM | POA: Diagnosis not present

## 2017-09-24 DIAGNOSIS — I781 Nevus, non-neoplastic: Secondary | ICD-10-CM | POA: Diagnosis not present

## 2017-09-24 DIAGNOSIS — D692 Other nonthrombocytopenic purpura: Secondary | ICD-10-CM | POA: Diagnosis not present

## 2017-09-24 DIAGNOSIS — L304 Erythema intertrigo: Secondary | ICD-10-CM | POA: Diagnosis not present

## 2017-09-24 DIAGNOSIS — D1800 Hemangioma unspecified site: Secondary | ICD-10-CM | POA: Diagnosis not present

## 2017-09-24 DIAGNOSIS — D229 Melanocytic nevi, unspecified: Secondary | ICD-10-CM | POA: Diagnosis not present

## 2017-09-24 DIAGNOSIS — L720 Epidermal cyst: Secondary | ICD-10-CM | POA: Diagnosis not present

## 2017-09-24 DIAGNOSIS — Z85820 Personal history of malignant melanoma of skin: Secondary | ICD-10-CM | POA: Diagnosis not present

## 2017-09-24 DIAGNOSIS — L820 Inflamed seborrheic keratosis: Secondary | ICD-10-CM | POA: Diagnosis not present

## 2017-09-24 DIAGNOSIS — L853 Xerosis cutis: Secondary | ICD-10-CM | POA: Diagnosis not present

## 2017-09-25 ENCOUNTER — Ambulatory Visit (INDEPENDENT_AMBULATORY_CARE_PROVIDER_SITE_OTHER): Payer: Medicare Other | Admitting: Internal Medicine

## 2017-09-25 ENCOUNTER — Encounter: Payer: Self-pay | Admitting: Internal Medicine

## 2017-09-25 DIAGNOSIS — I10 Essential (primary) hypertension: Secondary | ICD-10-CM | POA: Diagnosis not present

## 2017-09-25 DIAGNOSIS — G459 Transient cerebral ischemic attack, unspecified: Secondary | ICD-10-CM

## 2017-09-25 DIAGNOSIS — Z86711 Personal history of pulmonary embolism: Secondary | ICD-10-CM | POA: Diagnosis not present

## 2017-09-25 DIAGNOSIS — Z86718 Personal history of other venous thrombosis and embolism: Secondary | ICD-10-CM

## 2017-09-25 DIAGNOSIS — I639 Cerebral infarction, unspecified: Secondary | ICD-10-CM

## 2017-09-25 NOTE — Assessment & Plan Note (Signed)
Controlled on Atenolol, Lisinopril and HCTZ Will monitor

## 2017-09-25 NOTE — Assessment & Plan Note (Signed)
Lifelong anticoagulation Will monitor

## 2017-09-25 NOTE — Patient Instructions (Signed)

## 2017-09-25 NOTE — Assessment & Plan Note (Signed)
No residual effect Continue Atenolol, Lisinopril, HCTZ ASA and Coumadin

## 2017-09-25 NOTE — Progress Notes (Signed)
HPI  Pt presents to the clinic today to establish care and for management of the conditions listed below. She is transferring care from Dr. Deborra Medina.  HTN: Her BP today is 118/76. She is taking Atenolol, Lisinopril and HCTZ as prescribed. ECG from 07/2017 reviewed.  History of TIA: No residual effect. She is taking Atenolol, Lisinopril, HCTZ, ASA and Coumadin as prescribed.   History of DVT/PE: Will need lifelong anticoagulation .She is currently taking ASA and Coumadin.  Flu: 08/2017 Tetanus: 05/2011 Pneumovax: 12/2000 Prevnar: 12/2016 Shingles Vaccine: 09/2012 Mammogram: 07/2017 Bone Density: 01/2013 Colon Screening: 03/2011 Vision Screening: annually Dentist: annually  Past Medical History:  Diagnosis Date  . Colon polyps   . History of pulmonary embolism   . Hypertension   . Melanoma ([place]) 2003  . Seizures ([place])     Current Outpatient Prescriptions  Medication Sig Dispense Refill  . aspirin 81 MG tablet Take 81 mg by mouth daily.    Marland Kitchen atenolol (TENORMIN) 25 MG tablet TAKE 1 TABLET BY MOUTH EVERY DAY 90 tablet 3  . Cholecalciferol (VITAMIN D3) 2000 UNITS TABS Take one by mouth daily    . hydrochlorothiazide (MICROZIDE) 12.5 MG capsule TAKE 1 CAPSULE (12.5 MG TOTAL) BY MOUTH DAILY. 90 capsule 2  . lisinopril (PRINIVIL,ZESTRIL) 10 MG tablet TAKE 1 TABLET BY MOUTH EVERY DAY 90 tablet 2  . warfarin (COUMADIN) 5 MG tablet TAKE AS DIRECTED 30 tablet 2  . warfarin (COUMADIN) 7.5 MG tablet TAKE 1 TABLET EVERY DAY AND AS DIRECTED 70 tablet 2   No current facility-administered medications for this visit.     Allergies  Allergen Reactions  . Clobetasol Other (See Comments)    vertigo  . Clobetasol Propionate     This is ointment form  . Dextromethorphan     promethazine with DM, rapid hearbeat  . Phenergan [Promethazine Hcl]     ? Rapid heart rate  . Promethazine Other (See Comments)  . Zithromax [Azithromycin] Palpitations    Per OSH report, pt unable to recall ? Rapid heart rate     Family History  Problem Relation Age of Onset  . Heart disease Mother   . Breast cancer Daughter 52    Social History   Social History  . Marital status: Married    Spouse name: N/A  . Number of children: N/A  . Years of education: N/A   Occupational History  . Not on file.   Social History Main Topics  . Smoking status: Never Smoker  . Smokeless tobacco: Never Used  . Alcohol use Not on file  . Drug use: Unknown  . Sexual activity: No   Other Topics Concern  . Not on file   Social History Narrative   Recently moved from [place] to [facility].   Daughter and grandchildren live in [place].   Has other children in [place].   Retired Pharmacist, hospital.      Husband has been recovering from Prostate CA.    ROS:  Constitutional: Denies fever, malaise, fatigue, headache or abrupt weight changes.  Respiratory: Denies difficulty breathing, shortness of breath, cough or sputum production.   Cardiovascular: Denies chest pain, chest tightness, palpitations or swelling in the hands or feet.  Musculoskeletal: Denies decrease in range of motion, difficulty with gait, muscle pain or joint pain and swelling.  Skin: Denies redness, rashes, lesions or ulcercations.  Neurological: Denies dizziness, difficulty with memory, difficulty with speech or problems with balance and coordination.  Psych: Denies anxiety, depression, SI/HI.  No other  specific complaints in a complete review of systems (except as listed in HPI above).  PE: BP 118/76   Pulse 82   Temp 97.7 F (36.5 C) (Oral)   Wt 149 lb (67.6 kg)   SpO2 98%   BMI 26.39 kg/m   Wt Readings from Last 3 Encounters:  08/26/17 149 lb 12 oz (67.9 kg)  07/06/17 151 lb 9 oz (68.7 kg)  12/05/16 156 lb 4 oz (70.9 kg)    General: Appears her stated age, well developed, well nourished in NAD. Cardiovascular: Normal rate and rhythm. S1,S2 noted.  No murmur, rubs or gallops noted.  Pulmonary/Chest: Normal effort and positive vesicular  breath sounds. No respiratory distress. No wheezes, rales or ronchi noted.  Musculoskeletal:  No difficulty with gait.  Neurological: Alert and oriented.  Psychiatric: Mood and affect normal. Behavior is normal. Judgment and thought content normal.     BMET    Component Value Date/Time   NA 139 07/07/2017 0459   NA 140 04/22/2013 0237   K 3.8 07/07/2017 0459   K 3.6 04/22/2013 0237   CL 107 07/07/2017 0459   CL 106 04/22/2013 0237   CO2 25 07/07/2017 0459   CO2 29 04/22/2013 0237   GLUCOSE 97 07/07/2017 0459   GLUCOSE 88 04/22/2013 0237   BUN 15 07/07/2017 0459   BUN 26 (H) 04/22/2013 0237   CREATININE 0.72 07/07/2017 0459   CREATININE 0.74 01/01/2014 1556   CALCIUM 9.2 07/07/2017 0459   CALCIUM 9.7 04/22/2013 0237   GFRNONAA >60 07/07/2017 0459   GFRNONAA >60 04/22/2013 0237   GFRAA >60 07/07/2017 0459   GFRAA >60 04/22/2013 0237    Lipid Panel     Component Value Date/Time   CHOL 191 07/07/2017 0459   TRIG 41 07/07/2017 0459   HDL 66 07/07/2017 0459   CHOLHDL 2.9 07/07/2017 0459   VLDL 8 07/07/2017 0459   LDLCALC 117 (H) 07/07/2017 0459    CBC    Component Value Date/Time   WBC 9.5 07/06/2017 1306   RBC 4.28 07/06/2017 1306   HGB 13.7 07/06/2017 1306   HGB 13.8 05/05/2014 1218   HCT 40.4 07/06/2017 1306   HCT 36.4 05/12/2014 0628   PLT 159 07/06/2017 1306   PLT 163 05/05/2014 1218   MCV 94.4 07/06/2017 1306   MCV 97 05/05/2014 1218   MCH 32.1 07/06/2017 1306   MCHC 34.1 07/06/2017 1306   RDW 13.5 07/06/2017 1306   RDW 13.6 05/05/2014 1218   LYMPHSABS 1.5 11/29/2016 1426   MONOABS 0.4 11/29/2016 1426   EOSABS 0.1 11/29/2016 1426   BASOSABS 0.0 11/29/2016 1426    Hgb A1C Lab Results  Component Value Date   HGBA1C 5.4 07/07/2017     Assessment and Plan:

## 2017-10-09 ENCOUNTER — Other Ambulatory Visit: Payer: Self-pay | Admitting: Family Medicine

## 2017-10-09 DIAGNOSIS — H40053 Ocular hypertension, bilateral: Secondary | ICD-10-CM | POA: Diagnosis not present

## 2017-10-09 DIAGNOSIS — H2513 Age-related nuclear cataract, bilateral: Secondary | ICD-10-CM | POA: Diagnosis not present

## 2017-10-17 ENCOUNTER — Encounter: Payer: Self-pay | Admitting: Family Medicine

## 2017-10-17 DIAGNOSIS — H7202 Central perforation of tympanic membrane, left ear: Secondary | ICD-10-CM | POA: Diagnosis not present

## 2017-10-17 DIAGNOSIS — R490 Dysphonia: Secondary | ICD-10-CM | POA: Diagnosis not present

## 2017-10-17 DIAGNOSIS — Z7901 Long term (current) use of anticoagulants: Secondary | ICD-10-CM | POA: Diagnosis not present

## 2017-10-17 DIAGNOSIS — H90A32 Mixed conductive and sensorineural hearing loss, unilateral, left ear with restricted hearing on the contralateral side: Secondary | ICD-10-CM | POA: Diagnosis not present

## 2017-10-17 DIAGNOSIS — H6122 Impacted cerumen, left ear: Secondary | ICD-10-CM | POA: Diagnosis not present

## 2017-10-17 LAB — PROTIME-INR

## 2017-10-18 ENCOUNTER — Ambulatory Visit: Payer: Self-pay

## 2017-10-18 DIAGNOSIS — Z7901 Long term (current) use of anticoagulants: Secondary | ICD-10-CM | POA: Insufficient documentation

## 2017-10-18 LAB — POCT INR: INR: 3

## 2017-11-14 DIAGNOSIS — Z7901 Long term (current) use of anticoagulants: Secondary | ICD-10-CM | POA: Diagnosis not present

## 2017-11-14 LAB — POCT INR: INR: 2.5

## 2017-11-15 ENCOUNTER — Ambulatory Visit: Payer: Self-pay

## 2017-11-15 DIAGNOSIS — Z7901 Long term (current) use of anticoagulants: Secondary | ICD-10-CM

## 2017-12-09 ENCOUNTER — Encounter: Payer: Self-pay | Admitting: Internal Medicine

## 2017-12-09 ENCOUNTER — Ambulatory Visit (INDEPENDENT_AMBULATORY_CARE_PROVIDER_SITE_OTHER): Payer: Medicare Other | Admitting: Internal Medicine

## 2017-12-09 VITALS — BP 128/82 | HR 73 | Temp 97.5°F | Ht 63.25 in | Wt 147.0 lb

## 2017-12-09 DIAGNOSIS — E7800 Pure hypercholesterolemia, unspecified: Secondary | ICD-10-CM

## 2017-12-09 DIAGNOSIS — Z0000 Encounter for general adult medical examination without abnormal findings: Secondary | ICD-10-CM | POA: Diagnosis not present

## 2017-12-09 LAB — LIPID PANEL
CHOL/HDL RATIO: 3
CHOLESTEROL: 208 mg/dL — AB (ref 0–200)
HDL: 73.2 mg/dL (ref >39.00)
LDL CALC: 122 mg/dL — AB (ref 0–99)
NonHDL: 134.82
TRIGLYCERIDES: 64 mg/dL (ref 0.0–149.0)
VLDL: 12.8 mg/dL (ref 0.0–40.0)

## 2017-12-09 NOTE — Patient Instructions (Signed)
Health Maintenance for Postmenopausal Women Menopause is a normal process in which your reproductive ability comes to an end. This process happens gradually over a span of months to years, usually between the ages of 22 and 9. Menopause is complete when you have missed 12 consecutive menstrual periods. It is important to talk with your health care provider about some of the most common conditions that affect postmenopausal women, such as heart disease, cancer, and bone loss (osteoporosis). Adopting a healthy lifestyle and getting preventive care can help to promote your health and wellness. Those actions can also lower your chances of developing some of these common conditions. What should I know about menopause? During menopause, you may experience a number of symptoms, such as:  Moderate-to-severe hot flashes.  Night sweats.  Decrease in sex drive.  Mood swings.  Headaches.  Tiredness.  Irritability.  Memory problems.  Insomnia.  Choosing to treat or not to treat menopausal changes is an individual decision that you make with your health care provider. What should I know about hormone replacement therapy and supplements? Hormone therapy products are effective for treating symptoms that are associated with menopause, such as hot flashes and night sweats. Hormone replacement carries certain risks, especially as you become older. If you are thinking about using estrogen or estrogen with progestin treatments, discuss the benefits and risks with your health care provider. What should I know about heart disease and stroke? Heart disease, heart attack, and stroke become more likely as you age. This may be due, in part, to the hormonal changes that your body experiences during menopause. These can affect how your body processes dietary fats, triglycerides, and cholesterol. Heart attack and stroke are both medical emergencies. There are many things that you can do to help prevent heart disease  and stroke:  Have your blood pressure checked at least every 1-2 years. High blood pressure causes heart disease and increases the risk of stroke.  If you are 53-22 years old, ask your health care provider if you should take aspirin to prevent a heart attack or a stroke.  Do not use any tobacco products, including cigarettes, chewing tobacco, or electronic cigarettes. If you need help quitting, ask your health care provider.  It is important to eat a healthy diet and maintain a healthy weight. ? Be sure to include plenty of vegetables, fruits, low-fat dairy products, and lean protein. ? Avoid eating foods that are high in solid fats, added sugars, or salt (sodium).  Get regular exercise. This is one of the most important things that you can do for your health. ? Try to exercise for at least 150 minutes each week. The type of exercise that you do should increase your heart rate and make you sweat. This is known as moderate-intensity exercise. ? Try to do strengthening exercises at least twice each week. Do these in addition to the moderate-intensity exercise.  Know your numbers.Ask your health care provider to check your cholesterol and your blood glucose. Continue to have your blood tested as directed by your health care provider.  What should I know about cancer screening? There are several types of cancer. Take the following steps to reduce your risk and to catch any cancer development as early as possible. Breast Cancer  Practice breast self-awareness. ? This means understanding how your breasts normally appear and feel. ? It also means doing regular breast self-exams. Let your health care provider know about any changes, no matter how small.  If you are 40  or older, have a clinician do a breast exam (clinical breast exam or CBE) every year. Depending on your age, family history, and medical history, it may be recommended that you also have a yearly breast X-ray (mammogram).  If you  have a family history of breast cancer, talk with your health care provider about genetic screening.  If you are at high risk for breast cancer, talk with your health care provider about having an MRI and a mammogram every year.  Breast cancer (BRCA) gene test is recommended for women who have family members with BRCA-related cancers. Results of the assessment will determine the need for genetic counseling and BRCA1 and for BRCA2 testing. BRCA-related cancers include these types: ? Breast. This occurs in males or females. ? Ovarian. ? Tubal. This may also be called fallopian tube cancer. ? Cancer of the abdominal or pelvic lining (peritoneal cancer). ? Prostate. ? Pancreatic.  Cervical, Uterine, and Ovarian Cancer Your health care provider may recommend that you be screened regularly for cancer of the pelvic organs. These include your ovaries, uterus, and vagina. This screening involves a pelvic exam, which includes checking for microscopic changes to the surface of your cervix (Pap test).  For women ages 21-65, health care providers may recommend a pelvic exam and a Pap test every three years. For women ages 79-65, they may recommend the Pap test and pelvic exam, combined with testing for human papilloma virus (HPV), every five years. Some types of HPV increase your risk of cervical cancer. Testing for HPV may also be done on women of any age who have unclear Pap test results.  Other health care providers may not recommend any screening for nonpregnant women who are considered low risk for pelvic cancer and have no symptoms. Ask your health care provider if a screening pelvic exam is right for you.  If you have had past treatment for cervical cancer or a condition that could lead to cancer, you need Pap tests and screening for cancer for at least 20 years after your treatment. If Pap tests have been discontinued for you, your risk factors (such as having a new sexual partner) need to be  reassessed to determine if you should start having screenings again. Some women have medical problems that increase the chance of getting cervical cancer. In these cases, your health care provider may recommend that you have screening and Pap tests more often.  If you have a family history of uterine cancer or ovarian cancer, talk with your health care provider about genetic screening.  If you have vaginal bleeding after reaching menopause, tell your health care provider.  There are currently no reliable tests available to screen for ovarian cancer.  Lung Cancer Lung cancer screening is recommended for adults 69-62 years old who are at high risk for lung cancer because of a history of smoking. A yearly low-dose CT scan of the lungs is recommended if you:  Currently smoke.  Have a history of at least 30 pack-years of smoking and you currently smoke or have quit within the past 15 years. A pack-year is smoking an average of one pack of cigarettes per day for one year.  Yearly screening should:  Continue until it has been 15 years since you quit.  Stop if you develop a health problem that would prevent you from having lung cancer treatment.  Colorectal Cancer  This type of cancer can be detected and can often be prevented.  Routine colorectal cancer screening usually begins at  age 42 and continues through age 45.  If you have risk factors for colon cancer, your health care provider may recommend that you be screened at an earlier age.  If you have a family history of colorectal cancer, talk with your health care provider about genetic screening.  Your health care provider may also recommend using home test kits to check for hidden blood in your stool.  A small camera at the end of a tube can be used to examine your colon directly (sigmoidoscopy or colonoscopy). This is done to check for the earliest forms of colorectal cancer.  Direct examination of the colon should be repeated every  5-10 years until age 71. However, if early forms of precancerous polyps or small growths are found or if you have a family history or genetic risk for colorectal cancer, you may need to be screened more often.  Skin Cancer  Check your skin from head to toe regularly.  Monitor any moles. Be sure to tell your health care provider: ? About any new moles or changes in moles, especially if there is a change in a mole's shape or color. ? If you have a mole that is larger than the size of a pencil eraser.  If any of your family members has a history of skin cancer, especially at a young age, talk with your health care provider about genetic screening.  Always use sunscreen. Apply sunscreen liberally and repeatedly throughout the day.  Whenever you are outside, protect yourself by wearing long sleeves, pants, a wide-brimmed hat, and sunglasses.  What should I know about osteoporosis? Osteoporosis is a condition in which bone destruction happens more quickly than new bone creation. After menopause, you may be at an increased risk for osteoporosis. To help prevent osteoporosis or the bone fractures that can happen because of osteoporosis, the following is recommended:  If you are 46-71 years old, get at least 1,000 mg of calcium and at least 600 mg of vitamin D per day.  If you are older than age 55 but younger than age 65, get at least 1,200 mg of calcium and at least 600 mg of vitamin D per day.  If you are older than age 54, get at least 1,200 mg of calcium and at least 800 mg of vitamin D per day.  Smoking and excessive alcohol intake increase the risk of osteoporosis. Eat foods that are rich in calcium and vitamin D, and do weight-bearing exercises several times each week as directed by your health care provider. What should I know about how menopause affects my mental health? Depression may occur at any age, but it is more common as you become older. Common symptoms of depression  include:  Low or sad mood.  Changes in sleep patterns.  Changes in appetite or eating patterns.  Feeling an overall lack of motivation or enjoyment of activities that you previously enjoyed.  Frequent crying spells.  Talk with your health care provider if you think that you are experiencing depression. What should I know about immunizations? It is important that you get and maintain your immunizations. These include:  Tetanus, diphtheria, and pertussis (Tdap) booster vaccine.  Influenza every year before the flu season begins.  Pneumonia vaccine.  Shingles vaccine.  Your health care provider may also recommend other immunizations. This information is not intended to replace advice given to you by your health care provider. Make sure you discuss any questions you have with your health care provider. Document Released: 01/11/2006  Document Revised: 06/08/2016 Document Reviewed: 08/23/2015 Elsevier Interactive Patient Education  2018 Elsevier Inc.  

## 2017-12-09 NOTE — Progress Notes (Signed)
HPI:  Pt presents to the clinic today for her Medicare Wellness Exam.  Past Medical History:  Diagnosis Date  . Colon polyps   . History of pulmonary embolism   . Hypertension   . Melanoma ([place]) 2003  . Seizures ([place])     Current Outpatient Medications  Medication Sig Dispense Refill  . aspirin 81 MG tablet Take 81 mg by mouth daily.    Marland Kitchen atenolol (TENORMIN) 25 MG tablet TAKE 1 TABLET BY MOUTH EVERY DAY 90 tablet 3  . Cholecalciferol (VITAMIN D3) 2000 UNITS TABS Take one by mouth daily    . hydrochlorothiazide (MICROZIDE) 12.5 MG capsule TAKE 1 CAPSULE (12.5 MG TOTAL) BY MOUTH DAILY. 90 capsule 2  . lisinopril (PRINIVIL,ZESTRIL) 10 MG tablet TAKE 1 TABLET BY MOUTH EVERY DAY 90 tablet 0  . warfarin (COUMADIN) 5 MG tablet TAKE AS DIRECTED 30 tablet 2  . warfarin (COUMADIN) 7.5 MG tablet TAKE 1 TABLET EVERY DAY AND AS DIRECTED 70 tablet 2   No current facility-administered medications for this visit.     Allergies  Allergen Reactions  . Clobetasol Other (See Comments)    vertigo  . Clobetasol Propionate     This is ointment form  . Dextromethorphan     promethazine with DM, rapid hearbeat  . Phenergan [Promethazine Hcl]     ? Rapid heart rate  . Promethazine Other (See Comments)  . Zithromax [Azithromycin] Palpitations    Per OSH report, pt unable to recall ? Rapid heart rate    Family History  Problem Relation Age of Onset  . Heart disease Mother   . Breast cancer Daughter 65    Social History   Socioeconomic History  . Marital status: Married    Spouse name: Not on file  . Number of children: Not on file  . Years of education: Not on file  . Highest education level: Not on file  Social Needs  . Financial resource strain: Not on file  . Food insecurity - worry: Not on file  . Food insecurity - inability: Not on file  . Transportation needs - medical: Not on file  . Transportation needs - non-medical: Not on file  Occupational History  . Not on file   Tobacco Use  . Smoking status: Never Smoker  . Smokeless tobacco: Never Used  Substance and Sexual Activity  . Alcohol use: Not on file  . Drug use: Not on file  . Sexual activity: No  Other Topics Concern  . Not on file  Social History Narrative   Recently moved from [place] to [facility].   Daughter and grandchildren live in [place].   Has other children in [place].   Retired Pharmacist, hospital.      Husband has been recovering from Prostate CA.    Hospitiliaztions: None  Health Maintenance:    Flu: 08/2017  Tetanus: 05/2011  Pneumovax: 12/2000  Prevnar: 12/2016  Zostavax: 09/2012  Shingrix: never  Mammogram: 07/2017  Pap Smear: no longer screening  Bone Density: 01/2013  Colon Screening: 03/2011  Eye Doctor: annually  Dental Exam: annually   Providers:   PCP: [name], NP-C  Ophthalmologist: Dr. Wallace Going  Podiatry: Dr. Prudence Davidson  Dermatology: Dr. Nicole Kindred   I have personally reviewed and have noted:  1. The patient's medical and social history 2. Their use of alcohol, tobacco or illicit drugs 3. Their current medications and supplements 4. The patient's functional ability including ADL's, fall risks, home safety risks and hearing or visual impairment. 5.  Diet and physical activities 6. Evidence for depression or mood disorder  Subjective:   Review of Systems:   Constitutional: Denies fever, malaise, fatigue, headache or abrupt weight changes.  HEENT: Pt reports hoarseness. Denies eye pain, eye redness, ear pain, ringing in the ears, wax buildup, runny nose, nasal congestion, bloody nose, or sore throat. Respiratory: Denies difficulty breathing, shortness of breath, cough or sputum production.   Cardiovascular: Denies chest pain, chest tightness, palpitations or swelling in the hands or feet.  Gastrointestinal: Denies abdominal pain, bloating, constipation, diarrhea or blood in the stool.  GU: Denies urgency, frequency, pain with urination, burning sensation, blood in  urine, odor or discharge. Musculoskeletal: Denies decrease in range of motion, difficulty with gait, muscle pain or joint pain and swelling.  Skin: Denies redness, rashes, lesions or ulcercations.  Neurological: Pt reports difficulty with balance. Denies dizziness, difficulty with memory, difficulty with speech or problems with coordination.  Psych: Denies anxiety, depression, SI/HI.  No other specific complaints in a complete review of systems (except as listed in HPI above).  Objective:  PE:  BP 128/82   Pulse 73   Temp (!) 97.5 F (36.4 C) (Oral)   Ht 5' 3.25" (1.607 m)   Wt 147 lb (66.7 kg)   SpO2 97%   BMI 25.83 kg/m   Wt Readings from Last 3 Encounters:  09/25/17 149 lb (67.6 kg)  08/26/17 149 lb 12 oz (67.9 kg)  07/06/17 151 lb 9 oz (68.7 kg)    General: Appears her stated age, well developed, well nourished in NAD. Cardiovascular: Normal rate and rhythm. S1,S2 noted.  No murmur, rubs or gallops noted.  Pulmonary/Chest: Normal effort and positive vesicular breath sounds. No respiratory distress. No wheezes, rales or ronchi noted.  Musculoskeletal:  Strength 5/5 BUE/BLE. No difficulty with gait.  Neurological: Alert and oriented.  Psychiatric: Mood and affect normal. Behavior is normal. Judgment and thought content normal.     BMET    Component Value Date/Time   NA 139 07/07/2017 0459   NA 140 04/22/2013 0237   K 3.8 07/07/2017 0459   K 3.6 04/22/2013 0237   CL 107 07/07/2017 0459   CL 106 04/22/2013 0237   CO2 25 07/07/2017 0459   CO2 29 04/22/2013 0237   GLUCOSE 97 07/07/2017 0459   GLUCOSE 88 04/22/2013 0237   BUN 15 07/07/2017 0459   BUN 26 (H) 04/22/2013 0237   CREATININE 0.72 07/07/2017 0459   CREATININE 0.74 01/01/2014 1556   CALCIUM 9.2 07/07/2017 0459   CALCIUM 9.7 04/22/2013 0237   GFRNONAA >60 07/07/2017 0459   GFRNONAA >60 04/22/2013 0237   GFRAA >60 07/07/2017 0459   GFRAA >60 04/22/2013 0237    Lipid Panel     Component Value  Date/Time   CHOL 191 07/07/2017 0459   TRIG 41 07/07/2017 0459   HDL 66 07/07/2017 0459   CHOLHDL 2.9 07/07/2017 0459   VLDL 8 07/07/2017 0459   LDLCALC 117 (H) 07/07/2017 0459    CBC    Component Value Date/Time   WBC 9.5 07/06/2017 1306   RBC 4.28 07/06/2017 1306   HGB 13.7 07/06/2017 1306   HGB 13.8 05/05/2014 1218   HCT 40.4 07/06/2017 1306   HCT 36.4 05/12/2014 0628   PLT 159 07/06/2017 1306   PLT 163 05/05/2014 1218   MCV 94.4 07/06/2017 1306   MCV 97 05/05/2014 1218   MCH 32.1 07/06/2017 1306   MCHC 34.1 07/06/2017 1306   RDW 13.5 07/06/2017 1306  RDW 13.6 05/05/2014 1218   LYMPHSABS 1.5 11/29/2016 1426   MONOABS 0.4 11/29/2016 1426   EOSABS 0.1 11/29/2016 1426   BASOSABS 0.0 11/29/2016 1426    Hgb A1C Lab Results  Component Value Date   HGBA1C 5.4 07/07/2017      Assessment and Plan:   Medicare Annual Wellness Visit:  Diet: She does eat meat. She consumes fruits and veggies daily. She rarely eats fried food. She drinks mostly water, milk, lemonade. Physical activity: She is doing aqua therapy 5 days per week at [facility] Depression/mood screen: Negative Hearing: Intact to whispered voice Visual acuity: Grossly normal, performs annual eye exam  ADLs: Capable Fall risk: None Home safety: Good Cognitive evaluation: Intact to orientation, naming, recall and repetition EOL planning: Adv directives, DNR/ I agree  Preventative Medicine: All immunizations UTD. Mammogram UTD. She no longer needs pap smears. Bone density ordered, she will schedule with her mammogram. She is no longer screening for colon cancer. Encouraged her to consume a balanced diet and exercise regimen. Advised her to see an eye doctor and dentist annually. Will check lipid profile today.   Next appointment: 1 year, Medicare Wellness Exam   [name], NP

## 2017-12-12 ENCOUNTER — Ambulatory Visit: Payer: Self-pay

## 2017-12-12 DIAGNOSIS — Z7901 Long term (current) use of anticoagulants: Secondary | ICD-10-CM | POA: Diagnosis not present

## 2017-12-12 DIAGNOSIS — D802 Selective deficiency of immunoglobulin A [IgA]: Secondary | ICD-10-CM | POA: Diagnosis not present

## 2017-12-12 LAB — POCT INR: INR: 2.6

## 2017-12-20 ENCOUNTER — Telehealth: Payer: Self-pay | Admitting: Internal Medicine

## 2017-12-20 DIAGNOSIS — H2511 Age-related nuclear cataract, right eye: Secondary | ICD-10-CM | POA: Diagnosis not present

## 2017-12-20 LAB — POCT INR: INR: 2.6

## 2017-12-20 NOTE — Patient Instructions (Signed)
Continue taking 3.75mg  on Mon, 5mg  on Fridays and 7.5mg  the rest of the days.  Recheck in 4 weeks.   Report and instructions given to [name], RN at [facility].  She will communicate instructions to patients and set up recheck.

## 2017-12-20 NOTE — Telephone Encounter (Signed)
Copied from [place] [phone]. Topic: General - Other >> Dec 20, 2017  2:10 PM Darl Householder, RMA wrote: Reason for CRM: Dorian Pod from [facility] is calling requesting a fax for INR results for 12/12/17, please fax to [phone]

## 2017-12-23 NOTE — Telephone Encounter (Signed)
Faxed INR results from 12/12/17 to [facility].  Confirmation received.

## 2017-12-26 ENCOUNTER — Ambulatory Visit (INDEPENDENT_AMBULATORY_CARE_PROVIDER_SITE_OTHER): Payer: Medicare Other | Admitting: Podiatry

## 2017-12-26 ENCOUNTER — Encounter: Payer: Self-pay | Admitting: Podiatry

## 2017-12-26 DIAGNOSIS — B351 Tinea unguium: Secondary | ICD-10-CM | POA: Diagnosis not present

## 2017-12-26 DIAGNOSIS — D689 Coagulation defect, unspecified: Secondary | ICD-10-CM

## 2017-12-26 DIAGNOSIS — M79676 Pain in unspecified toe(s): Secondary | ICD-10-CM | POA: Diagnosis not present

## 2017-12-26 DIAGNOSIS — M79609 Pain in unspecified limb: Principal | ICD-10-CM

## 2017-12-26 NOTE — Progress Notes (Signed)
Complaint:  Visit Type: Patient returns to my office for continued preventative foot care services. Complaint: Patient states" my nails have grown long and thick and become painful to walk and wear shoes"  The patient presents for preventative foot care services. No changes to ROS.  Patient is taking coumadin.  Podiatric Exam: Vascular: dorsalis pedis and posterior tibial pulses are palpable bilateral. Capillary return is immediate. Temperature gradient is WNL. Skin turgor WNL  Sensorium: Normal Semmes Weinstein monofilament test. Normal tactile sensation bilaterally. Nail Exam: Pt has thick disfigured discolored nails with subungual debris noted bilateral entire nail hallux through fifth toenails Ulcer Exam: There is no evidence of ulcer or pre-ulcerative changes or infection. Orthopedic Exam: Muscle tone and strength are WNL. No limitations in general ROM. No crepitus or effusions noted. Foot type and digits show no abnormalities. Bony prominences are unremarkable. Skin: No Porokeratosis. No infection or ulcers  Diagnosis:  Onychomycosis, , Pain in right toe, pain in left toes  Treatment & Plan Procedures and Treatment: Consent by patient was obtained for treatment procedures. The patient understood the discussion of treatment and procedures well. All questions were answered thoroughly reviewed. Debridement of mycotic and hypertrophic toenails, 1 through 5 bilateral and clearing of subungual debris. No ulceration, no infection noted.  ABN signed for 2019. Return Visit-Office Procedure: Patient instructed to return to the office for a follow up visit 3 months for continued evaluation and treatment.    Kiarra Kidd DPM 

## 2017-12-31 ENCOUNTER — Encounter: Payer: Self-pay | Admitting: *Deleted

## 2017-12-31 ENCOUNTER — Other Ambulatory Visit: Payer: Self-pay

## 2018-01-03 ENCOUNTER — Other Ambulatory Visit: Payer: Self-pay | Admitting: Family Medicine

## 2018-01-03 NOTE — Discharge Instructions (Signed)

## 2018-01-03 NOTE — Telephone Encounter (Signed)
RB-Plz see refill req/thx dmf 

## 2018-01-06 ENCOUNTER — Ambulatory Visit: Payer: Medicare Other | Admitting: Anesthesiology

## 2018-01-06 ENCOUNTER — Ambulatory Visit
Admission: RE | Admit: 2018-01-06 | Discharge: 2018-01-06 | Disposition: A | Discharge status: Home / Self Care | Payer: Medicare Other | Source: Ambulatory Visit | Attending: Ophthalmology | Admitting: Ophthalmology

## 2018-01-06 ENCOUNTER — Encounter
Admission: RE | Disposition: A | Discharge status: Home / Self Care | Payer: Self-pay | Source: Ambulatory Visit | Attending: Ophthalmology

## 2018-01-06 DIAGNOSIS — I10 Essential (primary) hypertension: Secondary | ICD-10-CM | POA: Insufficient documentation

## 2018-01-06 DIAGNOSIS — H2511 Age-related nuclear cataract, right eye: Secondary | ICD-10-CM | POA: Insufficient documentation

## 2018-01-06 DIAGNOSIS — Z79899 Other long term (current) drug therapy: Secondary | ICD-10-CM | POA: Insufficient documentation

## 2018-01-06 DIAGNOSIS — Z7982 Long term (current) use of aspirin: Secondary | ICD-10-CM | POA: Insufficient documentation

## 2018-01-06 DIAGNOSIS — Z86711 Personal history of pulmonary embolism: Secondary | ICD-10-CM | POA: Insufficient documentation

## 2018-01-06 DIAGNOSIS — Z7901 Long term (current) use of anticoagulants: Secondary | ICD-10-CM | POA: Diagnosis not present

## 2018-01-06 HISTORY — DX: Dizziness and giddiness: R42

## 2018-01-06 HISTORY — PX: CATARACT EXTRACTION W/PHACO: SHX586

## 2018-01-06 SURGERY — PHACOEMULSIFICATION, CATARACT, WITH IOL INSERTION
Anesthesia: Monitor Anesthesia Care | Site: Eye | Laterality: Right | Wound class: Clean

## 2018-01-06 MED ORDER — MOXIFLOXACIN HCL 0.5 % OP SOLN
1.0000 [drp] | OPHTHALMIC | Status: DC | PRN
  Administered 2018-01-06 (×4): 1 [drp] via OPHTHALMIC

## 2018-01-06 MED ORDER — ARMC OPHTHALMIC DILATING DROPS
1.0000 "application " | OPHTHALMIC | Status: DC | PRN
  Administered 2018-01-06 (×3): 1 via OPHTHALMIC

## 2018-01-06 MED ORDER — FENTANYL CITRATE (PF) 100 MCG/2ML IJ SOLN
INTRAMUSCULAR | Status: DC | PRN
  Administered 2018-01-06: 50 ug via INTRAVENOUS

## 2018-01-06 MED ORDER — MIDAZOLAM HCL 2 MG/2ML IJ SOLN
INTRAMUSCULAR | Status: DC | PRN
  Administered 2018-01-06: 1 mg via INTRAVENOUS

## 2018-01-06 MED ORDER — LIDOCAINE HCL (PF) 2 % IJ SOLN
INTRAOCULAR | Status: DC | PRN
  Administered 2018-01-06: 2 mL

## 2018-01-06 MED ORDER — EPINEPHRINE PF 1 MG/ML IJ SOLN
INTRAOCULAR | Status: DC | PRN
  Administered 2018-01-06: 53 mL via OPHTHALMIC

## 2018-01-06 MED ORDER — CEFUROXIME OPHTHALMIC INJECTION 1 MG/0.1 ML
INJECTION | OPHTHALMIC | Status: DC | PRN
  Administered 2018-01-06: 0.1 mL via INTRACAMERAL

## 2018-01-06 MED ORDER — NA HYALUR & NA CHOND-NA HYALUR 0.4-0.35 ML IO KIT
PACK | INTRAOCULAR | Status: DC | PRN
  Administered 2018-01-06: 1 mL via INTRAOCULAR

## 2018-01-06 MED ORDER — LACTATED RINGERS IV SOLN: 1000.0000 mL | INTRAVENOUS | Status: DC

## 2018-01-06 MED ORDER — BRIMONIDINE TARTRATE-TIMOLOL 0.2-0.5 % OP SOLN
OPHTHALMIC | Status: DC | PRN
  Administered 2018-01-06: 1 [drp] via OPHTHALMIC

## 2018-01-06 SURGICAL SUPPLY — 18 items
CANNULA ANT/CHMB 27GA (MISCELLANEOUS) ×2 IMPLANT
GLOVE SURG LX 7.5 STRW (GLOVE) ×1
GLOVE SURG LX STRL 7.5 STRW (GLOVE) ×1 IMPLANT
GLOVE SURG TRIUMPH 8.0 PF LTX (GLOVE) ×2 IMPLANT
GOWN STRL REUS W/ TWL LRG LVL3 (GOWN DISPOSABLE) ×2 IMPLANT
GOWN STRL REUS W/TWL LRG LVL3 (GOWN DISPOSABLE) ×2
LENS IOL TECNIS ITEC 23.0 (Intraocular Lens) ×2 IMPLANT
MARKER SKIN DUAL TIP RULER LAB (MISCELLANEOUS) ×2 IMPLANT
NEEDLE FILTER BLUNT 18X 1/2SAF (NEEDLE) ×1
NEEDLE FILTER BLUNT 18X1 1/2 (NEEDLE) ×1 IMPLANT
PACK CATARACT BRASINGTON (MISCELLANEOUS) ×2 IMPLANT
PACK EYE AFTER SURG (MISCELLANEOUS) ×2 IMPLANT
PACK OPTHALMIC (MISCELLANEOUS) ×2 IMPLANT
SYR 3ML LL SCALE MARK (SYRINGE) ×2 IMPLANT
SYR 5ML LL (SYRINGE) ×2 IMPLANT
SYR TB 1ML LUER SLIP (SYRINGE) ×2 IMPLANT
WATER STERILE IRR 500ML POUR (IV SOLUTION) ×2 IMPLANT
WIPE NON LINTING 3.25X3.25 (MISCELLANEOUS) ×2 IMPLANT

## 2018-01-06 NOTE — Transfer of Care (Signed)
Immediate Anesthesia Transfer of Care Note  Patient: Becky Farrell  Procedure(s) Performed: CATARACT EXTRACTION PHACO AND INTRAOCULAR LENS PLACEMENT (IOC) RIGHT (Right Eye)  Patient Location: PACU  Anesthesia Type: MAC  Level of Consciousness: awake, alert  and patient cooperative  Airway and Oxygen Therapy: Patient Spontanous Breathing and Patient connected to supplemental oxygen  Post-op Assessment: Post-op Vital signs reviewed, Patient's Cardiovascular Status Stable, Respiratory Function Stable, Patent Airway and No signs of Nausea or vomiting  Post-op Vital Signs: Reviewed and stable  Complications: No apparent anesthesia complications

## 2018-01-06 NOTE — Anesthesia Postprocedure Evaluation (Signed)
Anesthesia Post Note  Patient: Becky Farrell  Procedure(s) Performed: CATARACT EXTRACTION PHACO AND INTRAOCULAR LENS PLACEMENT (IOC) RIGHT (Right Eye)  Patient location during evaluation: PACU Anesthesia Type: MAC Level of consciousness: awake Pain management: pain level controlled Vital Signs Assessment: post-procedure vital signs reviewed and stable Respiratory status: spontaneous breathing Cardiovascular status: blood pressure returned to baseline Postop Assessment: no headache Anesthetic complications: no    Carreen Milius      

## 2018-01-06 NOTE — Op Note (Signed)
LOCATION:  [facility]   PREOPERATIVE DIAGNOSIS:    Nuclear sclerotic cataract right eye. H25.11   POSTOPERATIVE DIAGNOSIS:  Nuclear sclerotic cataract right eye.     PROCEDURE:  Phacoemusification with posterior chamber intraocular lens placement of the right eye   LENS:   Implant Name Type Inv. Item Serial No. Manufacturer Lot No. LRB No. Used  LENS IOL DIOP 23.0 - [id number] Intraocular Lens LENS IOL DIOP 23.0 [id number] AMO  Right 1        ULTRASOUND TIME: 22 % of 1 minutes, 10 seconds.  CDE 15.4   SURGEON:  Wyonia Hough, MD   ANESTHESIA:  Topical with tetracaine drops and 2% Xylocaine jelly, augmented with 1% preservative-free intracameral lidocaine.    COMPLICATIONS:  None.   DESCRIPTION OF PROCEDURE:  The patient was identified in the holding room and transported to the operating room and placed in the supine position under the operating microscope.  The right eye was identified as the operative eye and it was prepped and draped in the usual sterile ophthalmic fashion.   A 1 millimeter clear-corneal paracentesis was made at the 12:00 position.  0.5 ml of preservative-free 1% lidocaine was injected into the anterior chamber. The anterior chamber was filled with Viscoat viscoelastic.  A 2.4 millimeter keratome was used to make a near-clear corneal incision at the 9:00 position.  A curvilinear capsulorrhexis was made with a cystotome and capsulorrhexis forceps.  Balanced salt solution was used to hydrodissect and hydrodelineate the nucleus.   Phacoemulsification was then used in stop and chop fashion to remove the lens nucleus and epinucleus.  The remaining cortex was then removed using the irrigation and aspiration handpiece. Provisc was then placed into the capsular bag to distend it for lens placement.  A lens was then injected into the capsular bag.  The remaining viscoelastic was aspirated.   Wounds were hydrated with balanced salt solution.  The anterior  chamber was inflated to a physiologic pressure with balanced salt solution.  No wound leaks were noted. Cefuroxime 0.1 ml of a 10mg /ml solution was injected into the anterior chamber for a dose of 1 mg of intracameral antibiotic at the completion of the case.   Timolol and Brimonidine drops were applied to the eye.  The patient was taken to the recovery room in stable condition without complications of anesthesia or surgery.   Jesslynn Kruck 01/06/2018, 10:09 AM

## 2018-01-06 NOTE — Anesthesia Preprocedure Evaluation (Addendum)
Anesthesia Evaluation  Patient identified by MRN, date of birth, ID band Patient awake    Reviewed: Allergy & Precautions, NPO status , Patient's Chart, lab work & pertinent test results, reviewed documented beta blocker date and time   Airway Mallampati: II  TM Distance: >3 FB Neck ROM: Full    Dental no notable dental hx.    Pulmonary PE (on warfarin)   Pulmonary exam normal breath sounds clear to auscultation       Cardiovascular hypertension,  Rhythm:Regular Rate:Normal + Systolic murmurs (3/6 RUSB)    Neuro/Psych Seizures - (in 1993, off AEDs since 2014),  TIAnegative psych ROS   GI/Hepatic negative GI ROS, Neg liver ROS,   Endo/Other  negative endocrine ROS  Renal/GU negative Renal ROS  negative genitourinary   Musculoskeletal negative musculoskeletal ROS (+)   Abdominal Normal abdominal exam  (+)  Abdomen: soft.    Peds  Hematology negative hematology ROS (+)   Anesthesia Other Findings   Reproductive/Obstetrics                           Anesthesia Physical Anesthesia Plan  ASA: III  Anesthesia Plan: MAC   Post-op Pain Management:    Induction:   PONV Risk Score and Plan:   Airway Management Planned: Natural Airway  Additional Equipment: None  Intra-op Plan:   Post-operative Plan:   Informed Consent: I have reviewed the patients History and Physical, chart, labs and discussed the procedure including the risks, benefits and alternatives for the proposed anesthesia with the patient or authorized representative who has indicated his/her understanding and acceptance.     Plan Discussed with: Anesthesiologist, Surgeon and CRNA  Anesthesia Plan Comments:         Anesthesia Quick Evaluation

## 2018-01-06 NOTE — Anesthesia Procedure Notes (Signed)
Procedure Name: MAC Date/Time: 01/06/2018 9:51 AM Performed by: Janna Arch, CRNA Pre-anesthesia Checklist: Patient identified, Emergency Drugs available, Suction available and Patient being monitored Patient Re-evaluated:Patient Re-evaluated prior to induction Oxygen Delivery Method: Nasal cannula

## 2018-01-06 NOTE — Anesthesia Postprocedure Evaluation (Signed)
Anesthesia Post Note  Patient: Becky Farrell  Procedure(s) Performed: CATARACT EXTRACTION PHACO AND INTRAOCULAR LENS PLACEMENT (IOC) RIGHT (Right Eye)  Patient location during evaluation: PACU Anesthesia Type: MAC Level of consciousness: awake Pain management: pain level controlled Vital Signs Assessment: post-procedure vital signs reviewed and stable Respiratory status: spontaneous breathing Cardiovascular status: blood pressure returned to baseline Postop Assessment: no headache Anesthetic complications: no    Lavonna Monarch

## 2018-01-06 NOTE — H&P (Signed)
The History and Physical notes are on paper, have been signed, and are to be scanned. The patient remains stable and unchanged from the H&P.   Previous H&P reviewed, patient examined, and there are no changes.  Becky Farrell 01/06/2018 8:50 AM

## 2018-01-12 ENCOUNTER — Other Ambulatory Visit: Payer: Self-pay | Admitting: Internal Medicine

## 2018-01-16 ENCOUNTER — Ambulatory Visit: Payer: Self-pay

## 2018-01-16 ENCOUNTER — Telehealth: Payer: Self-pay

## 2018-01-16 DIAGNOSIS — Z7901 Long term (current) use of anticoagulants: Secondary | ICD-10-CM

## 2018-01-16 DIAGNOSIS — H2512 Age-related nuclear cataract, left eye: Secondary | ICD-10-CM | POA: Diagnosis not present

## 2018-01-16 LAB — POCT INR: INR: 2.7

## 2018-01-16 NOTE — Telephone Encounter (Signed)
I faxed requested info inr 12/12/17 to [facility] at [facility] eye fax # [phone] to [facility].

## 2018-01-16 NOTE — Telephone Encounter (Signed)
Noted. Thanks.

## 2018-01-16 NOTE — Telephone Encounter (Signed)
Copied from [place] [id number]. Topic: General - Other >> Jan 16, 2018 11:08 AM Carolyn Stare wrote:  Jenny Reichmann with  [facility] call to ask if pt recent INR can be fax over to her [phone] fax number      phone number [phone]

## 2018-01-17 LAB — POCT INR: INR: 2.7

## 2018-01-23 ENCOUNTER — Encounter: Payer: Self-pay | Admitting: *Deleted

## 2018-01-23 ENCOUNTER — Other Ambulatory Visit: Payer: Self-pay

## 2018-01-27 NOTE — Discharge Instructions (Signed)

## 2018-01-28 ENCOUNTER — Encounter: Payer: Self-pay | Admitting: Internal Medicine

## 2018-01-28 ENCOUNTER — Ambulatory Visit (INDEPENDENT_AMBULATORY_CARE_PROVIDER_SITE_OTHER): Payer: Medicare Other | Admitting: Internal Medicine

## 2018-01-28 VITALS — BP 118/70 | HR 68 | Temp 97.7°F | Wt 146.0 lb

## 2018-01-28 DIAGNOSIS — K5901 Slow transit constipation: Secondary | ICD-10-CM | POA: Diagnosis not present

## 2018-01-28 NOTE — Patient Instructions (Signed)

## 2018-01-28 NOTE — Progress Notes (Signed)
Subjective:    Patient ID: Becky Farrell, female    DOB: [birth year], 84 y.o.   MRN: [id number]  HPI  Pt presents to the clinic today with c/o gas and constipation. She has noticed a changed in her bowels over the last 3 moths. She is having to strain more. She does not feel like her stool is hard or that she has rectal pain, itching or bleeding. She denies nausea, vomiting or abdominal pain. She reports sometimes, she does not have a BM for 3-4 days. She attributes this to lack of water and she has not been exercising as much due to the cold weather. She has been using Benefiber and eating prunes with some relief. Her last BM was this morning.  Review of Systems      Past Medical History:  Diagnosis Date  . Colon polyps   . History of pulmonary embolism 2011  . Hypertension   . Melanoma ([place]) 2003  . Seizures ([place]) 1993   no meds since (approx) 2014  . Vertigo    x1. R/T perforated ear drum prior to 2013    Current Outpatient Medications  Medication Sig Dispense Refill  . aspirin 81 MG tablet Take 81 mg by mouth daily.    Marland Kitchen atenolol (TENORMIN) 25 MG tablet TAKE 1 TABLET BY MOUTH EVERY DAY 90 tablet 3  . Cholecalciferol (VITAMIN D3) 2000 UNITS TABS Take one by mouth daily    . hydrochlorothiazide (MICROZIDE) 12.5 MG capsule TAKE 1 CAPSULE (12.5 MG TOTAL) BY MOUTH DAILY. 90 capsule 2  . lisinopril (PRINIVIL,ZESTRIL) 10 MG tablet TAKE 1 TABLET BY MOUTH EVERY DAY 90 tablet 2  . warfarin (COUMADIN) 5 MG tablet TAKE AS DIRECTED 30 tablet 2  . warfarin (COUMADIN) 7.5 MG tablet TAKE 1 TABLET EVERY DAY AND AS DIRECTED 70 tablet 2   No current facility-administered medications for this visit.     Allergies  Allergen Reactions  . Clobetasol Other (See Comments)    vertigo  . Clobetasol Propionate     This is ointment form  . Dextromethorphan     promethazine with DM, rapid hearbeat  . Phenergan [Promethazine Hcl]     ? Rapid heart rate  . Promethazine Other (See Comments)  .  Zithromax [Azithromycin] Palpitations    Per OSH report, pt unable to recall ? Rapid heart rate    Family History  Problem Relation Age of Onset  . Heart disease Mother   . Breast cancer Daughter 57    Social History   Socioeconomic History  . Marital status: Married    Spouse name: Not on file  . Number of children: Not on file  . Years of education: Not on file  . Highest education level: Not on file  Social Needs  . Financial resource strain: Not on file  . Food insecurity - worry: Not on file  . Food insecurity - inability: Not on file  . Transportation needs - medical: Not on file  . Transportation needs - non-medical: Not on file  Occupational History  . Not on file  Tobacco Use  . Smoking status: Never Smoker  . Smokeless tobacco: Never Used  Substance and Sexual Activity  . Alcohol use: No    Frequency: Never  . Drug use: Not on file  . Sexual activity: No  Other Topics Concern  . Not on file  Social History Narrative   Recently moved from [place] to [facility].   Daughter and grandchildren live in  [place].   Has other children in [place].   Retired Pharmacist, hospital.      Husband has been recovering from Prostate CA.     Constitutional: Denies fever, malaise, fatigue, headache or abrupt weight changes.  Gastrointestinal: Pt reports gas and constipation. Denies abdominal pain, bloating, diarrhea or blood in the stool.    No other specific complaints in a complete review of systems (except as listed in HPI above).  Objective:   Physical Exam   BP 118/70   Pulse 68   Temp 97.7 F (36.5 C) (Oral)   Wt 146 lb (66.2 kg)   SpO2 97%   BMI 25.86 kg/m  Wt Readings from Last 3 Encounters:  01/28/18 146 lb (66.2 kg)  01/06/18 147 lb (66.7 kg)  12/09/17 147 lb (66.7 kg)    General: Appears her stated age, well developed, well nourished in NAD. Abdomen: Soft and nontender. Normal bowel sounds.   BMET    Component Value Date/Time   NA 139 07/07/2017 0459    NA 140 04/22/2013 0237   K 3.8 07/07/2017 0459   K 3.6 04/22/2013 0237   CL 107 07/07/2017 0459   CL 106 04/22/2013 0237   CO2 25 07/07/2017 0459   CO2 29 04/22/2013 0237   GLUCOSE 97 07/07/2017 0459   GLUCOSE 88 04/22/2013 0237   BUN 15 07/07/2017 0459   BUN 26 (H) 04/22/2013 0237   CREATININE 0.72 07/07/2017 0459   CREATININE 0.74 01/01/2014 1556   CALCIUM 9.2 07/07/2017 0459   CALCIUM 9.7 04/22/2013 0237   GFRNONAA >60 07/07/2017 0459   GFRNONAA >60 04/22/2013 0237   GFRAA >60 07/07/2017 0459   GFRAA >60 04/22/2013 0237    Lipid Panel     Component Value Date/Time   CHOL 208 (H) 12/09/2017 1007   TRIG 64.0 12/09/2017 1007   HDL 73.20 12/09/2017 1007   CHOLHDL 3 12/09/2017 1007   VLDL 12.8 12/09/2017 1007   LDLCALC 122 (H) 12/09/2017 1007    CBC    Component Value Date/Time   WBC 9.5 07/06/2017 1306   RBC 4.28 07/06/2017 1306   HGB 13.7 07/06/2017 1306   HGB 13.8 05/05/2014 1218   HCT 40.4 07/06/2017 1306   HCT 36.4 05/12/2014 0628   PLT 159 07/06/2017 1306   PLT 163 05/05/2014 1218   MCV 94.4 07/06/2017 1306   MCV 97 05/05/2014 1218   MCH 32.1 07/06/2017 1306   MCHC 34.1 07/06/2017 1306   RDW 13.5 07/06/2017 1306   RDW 13.6 05/05/2014 1218   LYMPHSABS 1.5 11/29/2016 1426   MONOABS 0.4 11/29/2016 1426   EOSABS 0.1 11/29/2016 1426   BASOSABS 0.0 11/29/2016 1426    Hgb A1C Lab Results  Component Value Date   HGBA1C 5.4 07/07/2017           Assessment & Plan:   Constipation:  Discussed high fiber diet Increase water intake Ok to continue [employer] daily  Let me know if not improving [name], NP

## 2018-01-29 ENCOUNTER — Ambulatory Visit: Payer: Medicare Other | Admitting: Anesthesiology

## 2018-01-29 ENCOUNTER — Encounter
Admission: RE | Disposition: A | Discharge status: Home / Self Care | Payer: Self-pay | Source: Ambulatory Visit | Attending: Ophthalmology

## 2018-01-29 ENCOUNTER — Ambulatory Visit
Admission: RE | Admit: 2018-01-29 | Discharge: 2018-01-29 | Disposition: A | Discharge status: Home / Self Care | Payer: Medicare Other | Source: Ambulatory Visit | Attending: Ophthalmology | Admitting: Ophthalmology

## 2018-01-29 DIAGNOSIS — Z86711 Personal history of pulmonary embolism: Secondary | ICD-10-CM | POA: Insufficient documentation

## 2018-01-29 DIAGNOSIS — Z7901 Long term (current) use of anticoagulants: Secondary | ICD-10-CM | POA: Diagnosis not present

## 2018-01-29 DIAGNOSIS — Z79899 Other long term (current) drug therapy: Secondary | ICD-10-CM | POA: Insufficient documentation

## 2018-01-29 DIAGNOSIS — H2512 Age-related nuclear cataract, left eye: Secondary | ICD-10-CM | POA: Diagnosis not present

## 2018-01-29 DIAGNOSIS — Z888 Allergy status to other drugs, medicaments and biological substances status: Secondary | ICD-10-CM | POA: Insufficient documentation

## 2018-01-29 DIAGNOSIS — I10 Essential (primary) hypertension: Secondary | ICD-10-CM | POA: Insufficient documentation

## 2018-01-29 DIAGNOSIS — J45909 Unspecified asthma, uncomplicated: Secondary | ICD-10-CM | POA: Insufficient documentation

## 2018-01-29 DIAGNOSIS — Z85820 Personal history of malignant melanoma of skin: Secondary | ICD-10-CM | POA: Insufficient documentation

## 2018-01-29 DIAGNOSIS — H5703 Miosis: Secondary | ICD-10-CM | POA: Insufficient documentation

## 2018-01-29 DIAGNOSIS — R011 Cardiac murmur, unspecified: Secondary | ICD-10-CM | POA: Diagnosis not present

## 2018-01-29 HISTORY — PX: CATARACT EXTRACTION W/PHACO: SHX586

## 2018-01-29 SURGERY — PHACOEMULSIFICATION, CATARACT, WITH IOL INSERTION
Anesthesia: Monitor Anesthesia Care | Site: Eye | Laterality: Left | Wound class: Clean

## 2018-01-29 MED ORDER — LACTATED RINGERS IV SOLN: INTRAVENOUS | Status: DC

## 2018-01-29 MED ORDER — MIDAZOLAM HCL 2 MG/2ML IJ SOLN
INTRAMUSCULAR | Status: DC | PRN
  Administered 2018-01-29: 1 mg via INTRAVENOUS

## 2018-01-29 MED ORDER — LIDOCAINE HCL (PF) 2 % IJ SOLN
INTRAOCULAR | Status: DC | PRN
  Administered 2018-01-29: 13:00:00 via INTRAMUSCULAR

## 2018-01-29 MED ORDER — FENTANYL CITRATE (PF) 100 MCG/2ML IJ SOLN
INTRAMUSCULAR | Status: DC | PRN
  Administered 2018-01-29: 50 ug via INTRAVENOUS

## 2018-01-29 MED ORDER — ACETAMINOPHEN 325 MG PO TABS: 650.0000 mg | ORAL_TABLET | Freq: Once | ORAL | Status: DC | PRN

## 2018-01-29 MED ORDER — ARMC OPHTHALMIC DILATING DROPS
1.0000 "application " | OPHTHALMIC | Status: DC | PRN
  Administered 2018-01-29 (×3): 1 via OPHTHALMIC

## 2018-01-29 MED ORDER — ONDANSETRON HCL 4 MG/2ML IJ SOLN: 4.0000 mg | Freq: Once | INTRAMUSCULAR | Status: DC | PRN

## 2018-01-29 MED ORDER — NA HYALUR & NA CHOND-NA HYALUR 0.4-0.35 ML IO KIT
PACK | INTRAOCULAR | Status: DC | PRN
  Administered 2018-01-29: 1 mL via INTRAOCULAR

## 2018-01-29 MED ORDER — ACETAMINOPHEN 160 MG/5ML PO SOLN: 325.0000 mg | ORAL | Status: DC | PRN

## 2018-01-29 MED ORDER — BRIMONIDINE TARTRATE-TIMOLOL 0.2-0.5 % OP SOLN
OPHTHALMIC | Status: DC | PRN
  Administered 2018-01-29: 1 [drp] via OPHTHALMIC

## 2018-01-29 MED ORDER — BSS IO SOLN
INTRAOCULAR | Status: DC | PRN
  Administered 2018-01-29: 68 mL via OPHTHALMIC

## 2018-01-29 MED ORDER — CEFUROXIME OPHTHALMIC INJECTION 1 MG/0.1 ML
INJECTION | OPHTHALMIC | Status: DC | PRN
  Administered 2018-01-29: .3 mL via OPHTHALMIC

## 2018-01-29 MED ORDER — MOXIFLOXACIN HCL 0.5 % OP SOLN
1.0000 [drp] | OPHTHALMIC | Status: DC | PRN
  Administered 2018-01-29 (×3): 1 [drp] via OPHTHALMIC

## 2018-01-29 SURGICAL SUPPLY — 26 items

## 2018-01-29 NOTE — Transfer of Care (Signed)
Immediate Anesthesia Transfer of Care Note  Patient: Becky Farrell  Procedure(s) Performed: CATARACT EXTRACTION PHACO AND INTRAOCULAR LENS PLACEMENT (IOC) LEFT (Left Eye)  Patient Location: PACU  Anesthesia Type: MAC  Level of Consciousness: awake, alert  and patient cooperative  Airway and Oxygen Therapy: Patient Spontanous Breathing and Patient connected to supplemental oxygen  Post-op Assessment: Post-op Vital signs reviewed, Patient's Cardiovascular Status Stable, Respiratory Function Stable, Patent Airway and No signs of Nausea or vomiting  Post-op Vital Signs: Reviewed and stable  Complications: No apparent anesthesia complications

## 2018-01-29 NOTE — Anesthesia Procedure Notes (Signed)
Procedure Name: MAC Date/Time: 01/29/2018 12:31 PM Performed by: Janna Arch, CRNA Pre-anesthesia Checklist: Patient identified, Emergency Drugs available, Suction available and Patient being monitored Patient Re-evaluated:Patient Re-evaluated prior to induction Oxygen Delivery Method: Nasal cannula

## 2018-01-29 NOTE — Anesthesia Postprocedure Evaluation (Signed)
Anesthesia Post Note  Patient: Becky Farrell  Procedure(s) Performed: CATARACT EXTRACTION PHACO AND INTRAOCULAR LENS PLACEMENT (IOC) LEFT (Left Eye)  Patient location during evaluation: PACU Anesthesia Type: MAC Level of consciousness: awake and alert, oriented and patient cooperative Pain management: pain level controlled Vital Signs Assessment: post-procedure vital signs reviewed and stable Respiratory status: spontaneous breathing, nonlabored ventilation and respiratory function stable Cardiovascular status: blood pressure returned to baseline and stable Postop Assessment: adequate PO intake Anesthetic complications: no    Darrin Nipper

## 2018-01-29 NOTE — Op Note (Signed)
OPERATIVE NOTE  Becky Farrell [id number] 01/29/2018  PREOPERATIVE DIAGNOSIS:   Nuclear sclerotic cataract left eye with miotic pupil      H25.12   POSTOPERATIVE DIAGNOSIS:   Nuclear sclerotic cataract left eye with miotic pupil.     PROCEDURE:  Phacoemulsification with posterior chamber intraocular lens implantation of the left eye which required pupil stretching with the Malyugin pupil expansion device   LENS:   Implant Name Type Inv. Item Serial No. Manufacturer Lot No. LRB No. Used  LENS IOL DIOP 22.5 - [id number] Intraocular Lens LENS IOL DIOP 22.5 [id number]  Left 1        ULTRASOUND TIME: 17 % of 0 minutes, 52 seconds.  CDE 8.6   SURGEON:  Wyonia Hough, MD   ANESTHESIA: Topical with tetracaine drops and 2% Xylocaine jelly, augmented with 1% preservative-free intracameral lidocaine.   COMPLICATIONS:  None.   DESCRIPTION OF PROCEDURE:  The patient was identified in the holding room and transported to the operating room and placed in the supine position under the operating microscope.  The left eye was identified as the operative eye and it was prepped and draped in the usual sterile ophthalmic fashion.   A 1 millimeter clear-corneal paracentesis was made at the 1:30 position.  The anterior chamber was filled with Viscoat viscoelastic.  0.5 ml of preservative-free 1% lidocaine was injected into the anterior chamber.  A 2.4 millimeter keratome was used to make a near-clear corneal incision at the 10:30 position.  A Malyugin pupil expander was then placed through the main incision and into the anterior chamber of the eye.  The edge of the iris was secured on the lip of the pupil expander and it was released, thereby expanding the pupil to approximately 6 millimeters for completion of the cataract surgery.  Additional Viscoat was placed in the anterior chamber.  A cystotome and capsulorrhexis forceps were used to make a curvilinear capsulorrhexis.   Balanced salt solution  was used to hydrodissect and hydrodelineate the lens nucleus.   Phacoemulsification was used in stop and chop fashion to remove the lens, nucleus and epinucleus.  The remaining cortex was aspirated using the irrigation aspiration handpiece.  Additional Provisc was placed into the eye to distend the capsular bag for lens placement.  A lens was then injected into the capsular bag.  The pupil expanding ring was removed using a Kuglen hook and insertion device. The remaining viscoelastic was aspirated from the capsular bag and the anterior chamber.  The anterior chamber was filled with balanced salt solution to inflate to a physiologic pressure.   Wounds were hydrated with balanced salt solution.  The anterior chamber was inflated to a physiologic pressure with balanced salt solution.  No wound leaks were noted. Cefuroxime 0.1 ml of a 10mg /ml solution was injected into the anterior chamber for a dose of 1 mg of intracameral antibiotic at the completion of the case.   Timolol and Brimonidine drops were applied to the eye.  The patient was taken to the recovery room in stable condition without complications of anesthesia or surgery.  [name] 01/29/2018, 12:51 PM

## 2018-01-29 NOTE — H&P (Signed)
The History and Physical notes are on paper, have been signed, and are to be scanned. The patient remains stable and unchanged from the H&P.   Previous H&P reviewed, patient examined, and there are no changes.  Krrish Freund 01/29/2018 12:06 PM

## 2018-01-29 NOTE — Anesthesia Preprocedure Evaluation (Signed)
Anesthesia Evaluation  Patient identified by MRN, date of birth, ID band Patient awake    Reviewed: Allergy & Precautions, NPO status , Patient's Chart, lab work & pertinent test results  History of Anesthesia Complications Negative for: history of anesthetic complications  Airway Mallampati: I  TM Distance: >3 FB Neck ROM: Full    Dental no notable dental hx.    Pulmonary neg pulmonary ROS,    Pulmonary exam normal breath sounds clear to auscultation       Cardiovascular Exercise Tolerance: Good hypertension, + Valvular Problems/Murmurs (murmur)  Rhythm:Regular Rate:Normal + Systolic murmurs    Neuro/Psych Seizures - (x1, not currently on medication),     GI/Hepatic negative GI ROS,   Endo/Other  negative endocrine ROS  Renal/GU negative Renal ROS     Musculoskeletal   Abdominal   Peds  Hematology PE on warfarin   Anesthesia Other Findings   Reproductive/Obstetrics                             Anesthesia Physical Anesthesia Plan  ASA: III  Anesthesia Plan: MAC   Post-op Pain Management:    Induction: Intravenous  PONV Risk Score and Plan: 2 and TIVA and Midazolam  Airway Management Planned: Natural Airway  Additional Equipment:   Intra-op Plan:   Post-operative Plan:   Informed Consent: I have reviewed the patients History and Physical, chart, labs and discussed the procedure including the risks, benefits and alternatives for the proposed anesthesia with the patient or authorized representative who has indicated his/her understanding and acceptance.     Plan Discussed with: CRNA  Anesthesia Plan Comments:         Anesthesia Quick Evaluation

## 2018-02-09 ENCOUNTER — Other Ambulatory Visit: Payer: Self-pay | Admitting: Family Medicine

## 2018-02-13 LAB — POCT INR: INR: 2.6

## 2018-02-14 ENCOUNTER — Ambulatory Visit: Payer: Self-pay

## 2018-02-14 DIAGNOSIS — Z7901 Long term (current) use of anticoagulants: Secondary | ICD-10-CM

## 2018-02-14 NOTE — Patient Instructions (Signed)
Continue taking 3.75mg  on Mon, 5mg  on Fridays and 7.5mg  the rest of the days.  Recheck in 4 weeks.   Report and instructions given to [name], RN at [facility].  She will communicate instructions to patients and set up recheck.

## 2018-03-13 DIAGNOSIS — Z7901 Long term (current) use of anticoagulants: Secondary | ICD-10-CM | POA: Diagnosis not present

## 2018-03-13 LAB — POCT INR: INR: 2.3

## 2018-03-14 ENCOUNTER — Ambulatory Visit: Payer: Self-pay

## 2018-03-14 DIAGNOSIS — Z7901 Long term (current) use of anticoagulants: Secondary | ICD-10-CM

## 2018-03-14 NOTE — Patient Instructions (Signed)
Continue taking 3.75mg  on Mon, 5mg  on Fridays and 7.5mg  the rest of the days.  Recheck in 4 weeks.   Report and instructions given to [name], RN at [facility].  She will communicate instructions to patients and set up recheck.

## 2018-03-27 ENCOUNTER — Ambulatory Visit: Payer: Self-pay | Admitting: *Deleted

## 2018-03-27 NOTE — Telephone Encounter (Signed)
Pt had appt with [name] about 6-7 weeks ago with Jacquelynn Cree and was prescribed Miralax. Pt states she was having trouble with her bowel movements but was not constipated. Pt was started on Miralax and states she is having bowel movements but notices that her stools have been thinner in size and comes out in small amounts. Pt notes that the  color is still normal  but has experienced increased urgency to go when having bowel movements Pt states she also notes that sometimes she has to have a bowel movement a little stool will leak out before she is able to make it to the  bathroom. Pt states bowel movements before starting Miralax was normal size but harder to get out. Pt wanted to know if this was normal while taking Miralax. Explained to pt that with taking Miralax stool should become softer which may cause the stool to come out in smaller thin amount. Pt wanted to make to make [facility] aware and to see if she needed to change medications or if this is what she should expect with taking Miralax.   Reason for Disposition . Caller has medication question only, adult not sick, and triager answers question  Answer Assessment - Initial Assessment Questions 1. SYMPTOMS: "Do you have any symptoms?"     Having smaller thinner stools with taking miralax 2. SEVERITY: If symptoms are present, ask "Are they mild, moderate or severe?     Mild  Protocols used: MEDICATION QUESTION CALL-A-AH

## 2018-03-27 NOTE — Telephone Encounter (Signed)
Attempted to call both numbers listed in the contact information but no answer at this time. Left message on mobile phone to call office back.

## 2018-03-28 ENCOUNTER — Ambulatory Visit: Payer: Self-pay | Admitting: *Deleted

## 2018-03-28 NOTE — Telephone Encounter (Signed)
This can be normal. Is she taking Mirilax daily? If so, start taking every other day.

## 2018-03-28 NOTE — Telephone Encounter (Signed)
Called and spoke with patient she states that she does currently take it daily and will start taking it every other day. Understanding verbalized nothing further needed at this time.

## 2018-03-28 NOTE — Telephone Encounter (Signed)
Called in questioning effects of Miralax.   I answered her questions.   I let her know to drink plenty of fluids and it was ok to eat solid foods.    That having more BMs was the effect of the Miralax.   Denied having diarrhea.  She thanked me.  I let her know to call us back if she had further questions or problems.   She verbalized understanding.  Reason for Disposition . Caller has medication question only, adult not sick, and triager answers question  Answer Assessment - Initial Assessment Questions 1. SYMPTOMS: "Do you have any symptoms?"     I've started Miralax.   I had several stools.  I'm really cleaned out.   Is that what it's supposed to do? 2. SEVERITY: If symptoms are present, ask "Are they mild, moderate or severe?"     She was wondering about fluid and solid food intake.  Protocols used: MEDICATION QUESTION CALL-A-AH

## 2018-04-03 ENCOUNTER — Encounter: Payer: Self-pay | Admitting: Podiatry

## 2018-04-03 ENCOUNTER — Ambulatory Visit (INDEPENDENT_AMBULATORY_CARE_PROVIDER_SITE_OTHER): Payer: Medicare Other | Admitting: Podiatry

## 2018-04-03 DIAGNOSIS — B351 Tinea unguium: Secondary | ICD-10-CM | POA: Diagnosis not present

## 2018-04-03 DIAGNOSIS — M79609 Pain in unspecified limb: Principal | ICD-10-CM

## 2018-04-03 DIAGNOSIS — D689 Coagulation defect, unspecified: Secondary | ICD-10-CM

## 2018-04-03 DIAGNOSIS — M79676 Pain in unspecified toe(s): Secondary | ICD-10-CM | POA: Diagnosis not present

## 2018-04-03 NOTE — Progress Notes (Signed)
Complaint:  Visit Type: Patient returns to my office for continued preventative foot care services. Complaint: Patient states" my nails have grown long and thick and become painful to walk and wear shoes"  The patient presents for preventative foot care services. No changes to ROS.  Patient is taking coumadin.  Podiatric Exam: Vascular: dorsalis pedis and posterior tibial pulses are palpable bilateral. Capillary return is immediate. Temperature gradient is WNL. Skin turgor WNL  Sensorium: Normal Semmes Weinstein monofilament test. Normal tactile sensation bilaterally. Nail Exam: Pt has thick disfigured discolored nails with subungual debris noted bilateral entire nail hallux through fifth toenails Ulcer Exam: There is no evidence of ulcer or pre-ulcerative changes or infection. Orthopedic Exam: Muscle tone and strength are WNL. No limitations in general ROM. No crepitus or effusions noted. Foot type and digits show no abnormalities. Bony prominences are unremarkable. Skin: No Porokeratosis. No infection or ulcers  Diagnosis:  Onychomycosis, , Pain in right toe, pain in left toes  Treatment & Plan Procedures and Treatment: Consent by patient was obtained for treatment procedures. The patient understood the discussion of treatment and procedures well. All questions were answered thoroughly reviewed. Debridement of mycotic and hypertrophic toenails, 1 through 5 bilateral and clearing of subungual debris. No ulceration, no infection noted.  ABN signed for 2019. Return Visit-Office Procedure: Patient instructed to return to the office for a follow up visit 3 months for continued evaluation and treatment.    Leotha Westermeyer DPM 

## 2018-04-16 DIAGNOSIS — H7202 Central perforation of tympanic membrane, left ear: Secondary | ICD-10-CM | POA: Diagnosis not present

## 2018-04-16 DIAGNOSIS — H6123 Impacted cerumen, bilateral: Secondary | ICD-10-CM | POA: Diagnosis not present

## 2018-04-17 ENCOUNTER — Encounter: Payer: Self-pay | Admitting: Internal Medicine

## 2018-04-17 DIAGNOSIS — Z7901 Long term (current) use of anticoagulants: Secondary | ICD-10-CM | POA: Diagnosis not present

## 2018-04-17 LAB — PROTIME-INR

## 2018-04-18 ENCOUNTER — Ambulatory Visit: Payer: Self-pay

## 2018-04-18 DIAGNOSIS — Z7901 Long term (current) use of anticoagulants: Secondary | ICD-10-CM

## 2018-04-18 LAB — POCT INR: INR: 2.1

## 2018-04-18 NOTE — Patient Instructions (Signed)
Continue taking 3.75mg  on Mon, 5mg  on Fridays and 7.5mg  the rest of the days.  Recheck in 4 weeks.   Report and instructions given to [name], RN at [facility].  She will communicate instructions to patients and set up recheck.

## 2018-05-15 ENCOUNTER — Ambulatory Visit: Payer: Self-pay

## 2018-05-15 DIAGNOSIS — Z7901 Long term (current) use of anticoagulants: Secondary | ICD-10-CM | POA: Diagnosis not present

## 2018-05-15 LAB — POCT INR: INR: 2.4 (ref 2.0–3.0)

## 2018-05-16 LAB — POCT INR: INR: 2.4 (ref 2.0–3.0)

## 2018-05-16 NOTE — Patient Instructions (Signed)
INR results:  2.4  Continue taking 3.75mg  on Mon, 5mg  on Fridays and 7.5mg  the rest of the days.  Recheck in 4 weeks.   Report and instructions given to to Tyrone Schimke, RN at [facility].  She will communicate instructions to patients and set up recheck.

## 2018-05-28 ENCOUNTER — Telehealth: Payer: Self-pay | Admitting: Internal Medicine

## 2018-05-28 ENCOUNTER — Other Ambulatory Visit: Payer: Self-pay | Admitting: Internal Medicine

## 2018-05-28 DIAGNOSIS — Z780 Asymptomatic menopausal state: Secondary | ICD-10-CM

## 2018-05-28 DIAGNOSIS — Z1239 Encounter for other screening for malignant neoplasm of breast: Secondary | ICD-10-CM

## 2018-05-28 NOTE — Telephone Encounter (Signed)
See below crm  Need order for screening mammogram/bone density  Copied from [place] #[id number]. Topic: General - Other >> May 28, 2018  2:07 PM Mcneil, Ja-Kwan wrote: Reason for CRM: Pt states she was contacted to schedule an appt for a mammogram and she would like to have the bone density done at the same time. Cb# [phone]

## 2018-05-29 NOTE — Addendum Note (Signed)
Addended by: Jearld Fenton on: 05/29/2018 08:59 AM   Modules accepted: Orders

## 2018-05-29 NOTE — Telephone Encounter (Signed)
Mammogram and bone density ordered. 

## 2018-06-12 ENCOUNTER — Ambulatory Visit: Payer: Self-pay

## 2018-06-12 DIAGNOSIS — Z7901 Long term (current) use of anticoagulants: Secondary | ICD-10-CM | POA: Diagnosis not present

## 2018-06-12 DIAGNOSIS — D802 Selective deficiency of immunoglobulin A [IgA]: Secondary | ICD-10-CM | POA: Diagnosis not present

## 2018-06-12 LAB — POCT INR: INR: 2.4 (ref 2.0–3.0)

## 2018-06-17 NOTE — Patient Instructions (Signed)
INR results:  2.4  Continue taking 3.75mg  on Mon, 5mg  on Fridays and 7.5mg  the rest of the days.  Recheck in 4 weeks.   Report and instructions given to to [place], Therapist, sports at [facility].  She will communicate instructions to patient and set up recheck.

## 2018-06-20 DIAGNOSIS — S0003XA Contusion of scalp, initial encounter: Secondary | ICD-10-CM | POA: Diagnosis not present

## 2018-06-20 DIAGNOSIS — S0990XA Unspecified injury of head, initial encounter: Secondary | ICD-10-CM | POA: Diagnosis not present

## 2018-07-07 ENCOUNTER — Ambulatory Visit (INDEPENDENT_AMBULATORY_CARE_PROVIDER_SITE_OTHER): Payer: Medicare Other | Admitting: Podiatry

## 2018-07-07 ENCOUNTER — Ambulatory Visit: Payer: Self-pay

## 2018-07-07 ENCOUNTER — Encounter: Payer: Self-pay | Admitting: Podiatry

## 2018-07-07 DIAGNOSIS — Z7901 Long term (current) use of anticoagulants: Secondary | ICD-10-CM

## 2018-07-07 DIAGNOSIS — M79609 Pain in unspecified limb: Secondary | ICD-10-CM

## 2018-07-07 DIAGNOSIS — B351 Tinea unguium: Secondary | ICD-10-CM

## 2018-07-07 DIAGNOSIS — D689 Coagulation defect, unspecified: Secondary | ICD-10-CM

## 2018-07-07 LAB — POCT INR: INR: 2.2 (ref 2.0–3.0)

## 2018-07-07 NOTE — Progress Notes (Signed)
Complaint:  Visit Type: Patient returns to my office for continued preventative foot care services. Complaint: Patient states" my nails have grown long and thick and become painful to walk and wear shoes"  The patient presents for preventative foot care services. No changes to ROS.  Patient is taking coumadin.  Podiatric Exam: Vascular: dorsalis pedis and posterior tibial pulses are palpable bilateral. Capillary return is immediate. Temperature gradient is WNL. Skin turgor WNL  Sensorium: Normal Semmes Weinstein monofilament test. Normal tactile sensation bilaterally. Nail Exam: Pt has thick disfigured discolored nails with subungual debris noted bilateral entire nail hallux through fifth toenails Ulcer Exam: There is no evidence of ulcer or pre-ulcerative changes or infection. Orthopedic Exam: Muscle tone and strength are WNL. No limitations in general ROM. No crepitus or effusions noted. Foot type and digits show no abnormalities. Bony prominences are unremarkable. Skin: No Porokeratosis. No infection or ulcers  Diagnosis:  Onychomycosis, , Pain in right toe, pain in left toes  Treatment & Plan Procedures and Treatment: Consent by patient was obtained for treatment procedures. The patient understood the discussion of treatment and procedures well. All questions were answered thoroughly reviewed. Debridement of mycotic and hypertrophic toenails, 1 through 5 bilateral and clearing of subungual debris. No ulceration, no infection noted.  ABN signed for 2019. Return Visit-Office Procedure: Patient instructed to return to the office for a follow up visit 3 months for continued evaluation and treatment.    Gardiner Barefoot DPM

## 2018-07-10 ENCOUNTER — Ambulatory Visit: Payer: Medicare Other | Admitting: Podiatry

## 2018-07-10 NOTE — Patient Instructions (Signed)
INR results:  2.2  Continue taking 3.75mg  on Mon, 5mg  on Fridays and 7.5mg  the rest of the days.  Recheck in 4 weeks.   Report and instructions given to to [name], RN at [facility].  She will communicate instructions to patients and set up recheck.

## 2018-07-31 ENCOUNTER — Ambulatory Visit
Admission: RE | Admit: 2018-07-31 | Discharge: 2018-07-31 | Disposition: A | Discharge status: Home / Self Care | Payer: Medicare Other | Source: Ambulatory Visit | Attending: Internal Medicine | Admitting: Internal Medicine

## 2018-07-31 DIAGNOSIS — Z1239 Encounter for other screening for malignant neoplasm of breast: Secondary | ICD-10-CM

## 2018-07-31 DIAGNOSIS — M810 Age-related osteoporosis without current pathological fracture: Secondary | ICD-10-CM | POA: Diagnosis not present

## 2018-07-31 DIAGNOSIS — Z780 Asymptomatic menopausal state: Secondary | ICD-10-CM | POA: Insufficient documentation

## 2018-07-31 DIAGNOSIS — Z1231 Encounter for screening mammogram for malignant neoplasm of breast: Secondary | ICD-10-CM | POA: Insufficient documentation

## 2018-08-01 ENCOUNTER — Other Ambulatory Visit: Payer: Self-pay | Admitting: Internal Medicine

## 2018-08-01 DIAGNOSIS — R928 Other abnormal and inconclusive findings on diagnostic imaging of breast: Secondary | ICD-10-CM

## 2018-08-07 ENCOUNTER — Ambulatory Visit: Payer: Self-pay

## 2018-08-07 DIAGNOSIS — Z7901 Long term (current) use of anticoagulants: Secondary | ICD-10-CM

## 2018-08-07 DIAGNOSIS — D802 Selective deficiency of immunoglobulin A [IgA]: Secondary | ICD-10-CM | POA: Diagnosis not present

## 2018-08-07 LAB — POCT INR: INR: 2.2 (ref 2.0–3.0)

## 2018-08-08 NOTE — Patient Instructions (Signed)
INR results:  2.2  Continue taking 3.75mg  on Mon, 5mg  on Fridays and 7.5mg  the rest of the days.  Recheck in 4 weeks.   Report and instructions given to to [name], RN at [facility].  She will communicate instructions to patients and set up recheck.

## 2018-08-13 ENCOUNTER — Ambulatory Visit
Admission: RE | Admit: 2018-08-13 | Discharge: 2018-08-13 | Disposition: A | Discharge status: Home / Self Care | Payer: Medicare Other | Source: Ambulatory Visit | Attending: Internal Medicine | Admitting: Internal Medicine

## 2018-08-13 ENCOUNTER — Other Ambulatory Visit: Payer: Self-pay | Admitting: Internal Medicine

## 2018-08-13 DIAGNOSIS — R928 Other abnormal and inconclusive findings on diagnostic imaging of breast: Secondary | ICD-10-CM

## 2018-08-13 DIAGNOSIS — N6310 Unspecified lump in the right breast, unspecified quadrant: Secondary | ICD-10-CM

## 2018-08-13 DIAGNOSIS — N6311 Unspecified lump in the right breast, upper outer quadrant: Secondary | ICD-10-CM | POA: Diagnosis not present

## 2018-08-15 ENCOUNTER — Telehealth: Payer: Self-pay

## 2018-08-15 NOTE — Telephone Encounter (Signed)
Copied from [place] [id number]. Topic: General - Other >> Aug 15, 2018 10:02 AM Janace Aris A wrote: Reason for CRM: Patient called in wanting to specifically Talk to [facility]. Regarding a recent diagnosis. She would like a call back when possible.      Please advise

## 2018-08-15 NOTE — Telephone Encounter (Signed)
Left message on voicemail.

## 2018-08-18 DIAGNOSIS — H90A32 Mixed conductive and sensorineural hearing loss, unilateral, left ear with restricted hearing on the contralateral side: Secondary | ICD-10-CM | POA: Diagnosis not present

## 2018-08-18 DIAGNOSIS — H6123 Impacted cerumen, bilateral: Secondary | ICD-10-CM | POA: Diagnosis not present

## 2018-08-20 ENCOUNTER — Ambulatory Visit
Admission: RE | Admit: 2018-08-20 | Discharge: 2018-08-20 | Disposition: A | Discharge status: Home / Self Care | Payer: Medicare Other | Source: Ambulatory Visit | Attending: Internal Medicine | Admitting: Internal Medicine

## 2018-08-20 DIAGNOSIS — C50411 Malignant neoplasm of upper-outer quadrant of right female breast: Secondary | ICD-10-CM | POA: Diagnosis not present

## 2018-08-20 DIAGNOSIS — R928 Other abnormal and inconclusive findings on diagnostic imaging of breast: Secondary | ICD-10-CM

## 2018-08-20 DIAGNOSIS — N6311 Unspecified lump in the right breast, upper outer quadrant: Secondary | ICD-10-CM | POA: Diagnosis not present

## 2018-08-20 DIAGNOSIS — N6310 Unspecified lump in the right breast, unspecified quadrant: Secondary | ICD-10-CM | POA: Insufficient documentation

## 2018-08-20 HISTORY — PX: BREAST BIOPSY: SHX20

## 2018-08-20 NOTE — Telephone Encounter (Signed)
Pt came in office and want to know when does she need to resume taking her aspirin due to her having her biopsy today.. Pt stated she had talked to you about this and was waiting on your call.

## 2018-08-20 NOTE — Telephone Encounter (Signed)
She didn't talk to me about this but I would say resume 24 hours after biopsy.

## 2018-08-21 ENCOUNTER — Other Ambulatory Visit: Payer: Self-pay | Admitting: Internal Medicine

## 2018-08-21 NOTE — Telephone Encounter (Signed)
Pt is aware as instructed 

## 2018-08-22 ENCOUNTER — Encounter: Payer: Self-pay | Admitting: *Deleted

## 2018-08-22 NOTE — Progress Notes (Signed)
  Oncology Nurse Navigator Documentation  Navigator Location: [facility] (08/22/18 1100)   )Navigator Encounter Type: Introductory phone call (08/22/18 1100)   Abnormal Finding Date: 08/13/18 (08/22/18 1100) Confirmed Diagnosis Date: 08/21/18 (08/22/18 1100)                   Barriers/Navigation Needs: Coordination of Care (08/22/18 1100)   Interventions: Coordination of Care (08/22/18 1100)   Coordination of Care: Appts (08/22/18 1100)                  Time Spent with Patient: 45 (08/22/18 1100)   Called patient to establish navigation services.  Patient aware of her new diagnosis of breast cancer.  States she does not know any of the surgeons or medical oncologist here.  Requested that I e-mail her a list of all the local surgeons and oncologist.  States she would like to have an appointment to talk to [name], NP prior to making any other appointments.  I did call Regina's office and have scheduled the patient to be seen by her on 09/01/18 @ 12:15.  The patient's husband is out of town next week.  I will call and follow up with the patient after 09/01/18.  Will e-mail patient information today.

## 2018-08-25 LAB — SURGICAL PATHOLOGY

## 2018-08-27 ENCOUNTER — Telehealth: Payer: Self-pay | Admitting: Internal Medicine

## 2018-08-27 NOTE — Telephone Encounter (Signed)
Copied from [place] [id number]. Topic: General - Other >> Aug 27, 2018  9:30 AM Judyann Munson wrote: Patient is calling to advise she had the warfarin (COUMADIN) 7.5 MG tablet  5:30 pm  yesterday afternoon. Not realizing she took another 7.5 MG tablet at  1 am, she is wanting to know if she should take another today. She is requesting a nurse to give her call back.   After 2pm can call at [phone].

## 2018-08-27 NOTE — Telephone Encounter (Signed)
LM (okay per DPR) for patient to skip dose today since she took double yesterday/earlier this am.  I will try to reach patient again this afternoon to ensure instructions received and understood.

## 2018-08-28 NOTE — Telephone Encounter (Signed)
Patient called back yesterday afternoon and did confirm dosing instructions.  We will recheck per prior scheduled date.

## 2018-09-01 ENCOUNTER — Ambulatory Visit (INDEPENDENT_AMBULATORY_CARE_PROVIDER_SITE_OTHER): Payer: Medicare Other | Admitting: Internal Medicine

## 2018-09-01 ENCOUNTER — Encounter: Payer: Self-pay | Admitting: Internal Medicine

## 2018-09-01 VITALS — BP 108/70 | HR 76 | Temp 97.8°F | Ht 63.0 in | Wt 140.0 lb

## 2018-09-01 DIAGNOSIS — C50911 Malignant neoplasm of unspecified site of right female breast: Secondary | ICD-10-CM

## 2018-09-01 NOTE — Progress Notes (Signed)
Subjective:    Patient ID: Becky Farrell, female    DOB: 1934/11/28, 84 y.o.   MRN: [id number]  HPI  Pt presents to the clinic today to discuss recent breast cancer diagnosis. She had her mammogram 8/29 which showed a possible mass in the right breast. She had a diagnostic mammogram and ultrasound of the right breast 9/11 which showed a 4x5x8 mm mass at 10 o'clock about 9 cm from the nipple. She had a breast biopsy 08/2018 which was positive for Grad 2 ductal carcinoma in situ. She had a phone call from the Oncology Navigator on 08/22/18. She mainly wants to know what this diagnosis means, what her treatment plan will be and my recommendations for her treatment team.  Review of Systems      Past Medical History:  Diagnosis Date  . Colon polyps   . History of pulmonary embolism 2011  . Hypertension   . Melanoma ([place]) 2003  . Seizures ([place]) 1993   no meds since (approx) 2014  . Vertigo    x1. R/T perforated ear drum prior to 2013    Current Outpatient Medications  Medication Sig Dispense Refill  . aspirin 81 MG tablet Take 81 mg by mouth daily.    Marland Kitchen atenolol (TENORMIN) 25 MG tablet TAKE 1 TABLET BY MOUTH EVERY DAY 90 tablet 3  . Cholecalciferol (VITAMIN D3) 2000 UNITS TABS Take one by mouth daily    . hydrochlorothiazide (MICROZIDE) 12.5 MG capsule TAKE 1 CAPSULE (12.5 MG TOTAL) BY MOUTH DAILY. 90 capsule 2  . lisinopril (PRINIVIL,ZESTRIL) 10 MG tablet TAKE 1 TABLET BY MOUTH EVERY DAY 90 tablet 2  . warfarin (COUMADIN) 5 MG tablet TAKE AS DIRECTED 30 tablet 2  . warfarin (COUMADIN) 7.5 MG tablet TAKE 1 TABLET EVERY DAY AND AS DIRECTED 70 tablet 2  . Wheat Dextrin (BENEFIBER PO) Take by mouth.     No current facility-administered medications for this visit.     Allergies  Allergen Reactions  . Clobetasol Other (See Comments)    vertigo  . Clobetasol Propionate     This is ointment form  . Dextromethorphan     promethazine with DM, rapid hearbeat  . Phenergan [Promethazine  Hcl]     ? Rapid heart rate  . Promethazine Other (See Comments)  . Zithromax [Azithromycin] Palpitations    Per OSH report, pt unable to recall ? Rapid heart rate    Family History  Problem Relation Age of Onset  . Heart disease Mother   . Breast cancer Daughter 91    Social History   Socioeconomic History  . Marital status: Married    Spouse name: Not on file  . Number of children: Not on file  . Years of education: Not on file  . Highest education level: Not on file  Occupational History  . Not on file  Social Needs  . Financial resource strain: Not on file  . Food insecurity:    Worry: Not on file    Inability: Not on file  . Transportation needs:    Medical: Not on file    Non-medical: Not on file  Tobacco Use  . Smoking status: Never Smoker  . Smokeless tobacco: Never Used  Substance and Sexual Activity  . Alcohol use: No    Frequency: Never  . Drug use: Not on file  . Sexual activity: Never  Lifestyle  . Physical activity:    Days per week: Not on file    Minutes per  session: Not on file  . Stress: Not on file  Relationships  . Social connections:    Talks on phone: Not on file    Gets together: Not on file    Attends religious service: Not on file    Active member of club or organization: Not on file    Attends meetings of clubs or organizations: Not on file    Relationship status: Not on file  . Intimate partner violence:    Fear of current or ex partner: Not on file    Emotionally abused: Not on file    Physically abused: Not on file    Forced sexual activity: Not on file  Other Topics Concern  . Not on file  Social History Narrative   Recently moved from [place] to [facility].   Daughter and grandchildren live in [place].   Has other children in [place].   Retired Pharmacist, hospital.      Husband has been recovering from Prostate CA.     Constitutional: Denies fever, malaise, fatigue, headache or abrupt weight changes.  Skin: Pt reports right  breast cancer. Denies redness, rashes, lesions or ulcercations.    No other specific complaints in a complete review of systems (except as listed in HPI above).  Objective:   Physical Exam  BP 108/70   Pulse 76   Temp 97.8 F (36.6 C) (Oral)   Ht 5\' 3"  (1.6 m)   Wt 140 lb (63.5 kg)   SpO2 97%   BMI 24.80 kg/m  Wt Readings from Last 3 Encounters:  09/01/18 140 lb (63.5 kg)  01/29/18 144 lb (65.3 kg)  01/28/18 146 lb (66.2 kg)    General: Appears her stated age, well developed, well nourished in NAD. Breast: Deferred. Neurological: Alert and oriented.  Psychiatric: Mood and affect normal. Behavior is normal. Judgment and thought content normal.     BMET    Component Value Date/Time   NA 139 07/07/2017 0459   NA 140 04/22/2013 0237   K 3.8 07/07/2017 0459   K 3.6 04/22/2013 0237   CL 107 07/07/2017 0459   CL 106 04/22/2013 0237   CO2 25 07/07/2017 0459   CO2 29 04/22/2013 0237   GLUCOSE 97 07/07/2017 0459   GLUCOSE 88 04/22/2013 0237   BUN 15 07/07/2017 0459   BUN 26 (H) 04/22/2013 0237   CREATININE 0.72 07/07/2017 0459   CREATININE 0.74 01/01/2014 1556   CALCIUM 9.2 07/07/2017 0459   CALCIUM 9.7 04/22/2013 0237   GFRNONAA >60 07/07/2017 0459   GFRNONAA >60 04/22/2013 0237   GFRAA >60 07/07/2017 0459   GFRAA >60 04/22/2013 0237    Lipid Panel     Component Value Date/Time   CHOL 208 (H) 12/09/2017 1007   TRIG 64.0 12/09/2017 1007   HDL 73.20 12/09/2017 1007   CHOLHDL 3 12/09/2017 1007   VLDL 12.8 12/09/2017 1007   LDLCALC 122 (H) 12/09/2017 1007    CBC    Component Value Date/Time   WBC 9.5 07/06/2017 1306   RBC 4.28 07/06/2017 1306   HGB 13.7 07/06/2017 1306   HGB 13.8 05/05/2014 1218   HCT 40.4 07/06/2017 1306   HCT 36.4 05/12/2014 0628   PLT 159 07/06/2017 1306   PLT 163 05/05/2014 1218   MCV 94.4 07/06/2017 1306   MCV 97 05/05/2014 1218   MCH 32.1 07/06/2017 1306   MCHC 34.1 07/06/2017 1306   RDW 13.5 07/06/2017 1306   RDW 13.6  05/05/2014 1218   LYMPHSABS 1.5  11/29/2016 1426   MONOABS 0.4 11/29/2016 1426   EOSABS 0.1 11/29/2016 1426   BASOSABS 0.0 11/29/2016 1426    Hgb A1C Lab Results  Component Value Date   HGBA1C 5.4 07/07/2017            Assessment & Plan:   Right Breast Cancer:  Reviewed mammograms, ultrasound and biopsy Discussed role of nurse navigator Discussed procedure of lumpectomy Discussed difference between surgical, medical and radiation oncologist All questions answered  Return precautions discussed [name], NP

## 2018-09-01 NOTE — Patient Instructions (Signed)
Breast Cancer, Female Breast cancer is an abnormal growth of tissue (tumor) in the breast that is cancerous (malignant). Unlike noncancerous (benign) tumors, malignant tumors can spread to other parts of your body. The most common type of female breast cancer begins in the milk ducts (ductal carcinoma). Breast cancer is one of the most common types of cancer in women. What are the causes? The exact cause of female breast cancer is unknown. What increases the risk?  Age older than 59 years.  Family history of breast cancer.  Having the BRCA1 and BRCA2 genes.  Personal history of radiation exposure.  Obesity.  Menstrual periods that begin before age 57 years.  Menopause that begins after age 21 years.  Pregnant for the first time at the age of 105 years or older.  Using hormone therapy.  Drinking more than one alcoholic drink per day. What are the signs or symptoms?  A painless lump in your breast.  Changes in the size or shape of your breast.  Breast skin changes, such as puckering or dimpling.  Nipple abnormalities, such as scaling, crustiness, redness, or pulling in (retraction).  Nipple discharge that is bloody or clear. How is this diagnosed? Your health care provider will ask about your medical history. He or she may also perform a number of procedures, such as:  A physical exam. This will involve feeling the tissue around the breast and under the arms.  Taking a sample of nipple discharge. The sample will be examined under a microscope.  Breast X-rays (mammogram), breast ultrasound exams, or an MRI.  Taking a tissue sample (biopsy) from the breast. The sample will be examined under a microscope to look for cancer cells.  Your cancer will be staged to determine its severity and extent. Staging is a careful attempt to find out the size of the tumor, whether the cancer has spread, and if so, to what parts of the body. You may need to have more tests to determine the  stage of your cancer:  Stage 0-The tumor has not spread to other breast tissue.  Stage I-The cancer is only found in the breast. The tumor may be up to  in (2 cm) wide.  Stage II-The cancer has spread to nearby lymph nodes. The tumor may be up to 2 in (5 cm) wide.  Stage III-The cancer has spread to more distant lymph nodes. The tumor may be larger than 2 in (5 cm) wide.  Stage IV-The cancer has spread to other parts of the body, such as the bones, brain, liver, or lungs.  How is this treated? Depending on the type and stage, female breast cancer may be treated with one or more of the following therapies:  Surgery to remove just the tumor (lumpectomy) or the entire breast (mastectomy). Lymph nodes may also be removed.  Radiation therapy, which uses high-energy rays to kill cancer cells.  Chemotherapy, which is the use of drugs to kill cancer cells.  Hormone therapy, which involves taking medicine to adjust the hormone levels in your body. You may take medicine to decrease your estrogen levels. This can help stop cancer cells from growing.  Follow these instructions at home:  Take medicines only as directed by your health care provider.  Maintain a healthy diet.  Consider joining a support group. This may help you learn to cope with the stress of having breast cancer.  Keep all follow-up appointments as directed by your health care provider. Contact a health care provider if:  You have a sudden increase in pain.  You notice a new lump in either breast or under your arm.  You develop swelling in either arm or hand.  You lose weight without trying.  You have a fever.  You notice new fatigue or weakness. Get help right away if:  You have chest pain or trouble breathing.  You faint. This information is not intended to replace advice given to you by your health care provider. Make sure you discuss any questions you have with your health care provider. Document Released:  02/27/2006 Document Revised: 03/29/2016 Document Reviewed: 01/13/2014 Elsevier Interactive Patient Education  2017 [employer].

## 2018-09-03 ENCOUNTER — Other Ambulatory Visit: Payer: Self-pay | Admitting: *Deleted

## 2018-09-03 ENCOUNTER — Encounter: Payer: Self-pay | Admitting: *Deleted

## 2018-09-03 DIAGNOSIS — C50911 Malignant neoplasm of unspecified site of right female breast: Secondary | ICD-10-CM

## 2018-09-03 NOTE — Progress Notes (Signed)
  Oncology Nurse Navigator Documentation  Navigator Location: [facility] (09/03/18 1400) Referral date to RadOnc/MedOnc: 09/08/18 (09/03/18 1400) )Navigator Encounter Type: Telephone (09/03/18 1400) Telephone: Incoming Call (09/03/18 1400)                       Barriers/Navigation Needs: Coordination of Care (09/03/18 1400)   Interventions: Coordination of Care (09/03/18 1400)   Coordination of Care: Appts (09/03/18 1400)                  Time Spent with Patient: 45 (09/03/18 1400)   Patient called today wanting to know if I had a message from her pcp to schedule her appointments.  I explained that I did not have a voicemail or email or any messages from her pcp.  Asked patient if they discussed which surgeon and medical oncologist she wanted to see.  States she wants to see Dr. Marlou Starks and Dr. Mike Gip.  Message sent to [place] at [facility].  Patient is scheduled to see Dr. Marlou Starks on 09/12/18 @ 1:40 and Dr. Mike Gip on 09/08/18 @ 845.  Will give patient educational material at that time.

## 2018-09-04 ENCOUNTER — Ambulatory Visit: Payer: Self-pay

## 2018-09-04 DIAGNOSIS — Z7901 Long term (current) use of anticoagulants: Secondary | ICD-10-CM | POA: Diagnosis not present

## 2018-09-04 LAB — POCT INR: INR: 2.3 (ref 2.0–3.0)

## 2018-09-05 NOTE — Patient Instructions (Signed)
INR results:  2.3  Continue taking 3.75mg  on Mon, 5mg  on Fridays and 7.5mg  the rest of the days.  Recheck in 4 weeks.   Report and instructions given to to [name], RN at [facility].  She will communicate instructions to patients and set up recheck.

## 2018-09-06 DIAGNOSIS — Z170 Estrogen receptor positive status [ER+]: Secondary | ICD-10-CM | POA: Insufficient documentation

## 2018-09-06 DIAGNOSIS — C50211 Malignant neoplasm of upper-inner quadrant of right female breast: Secondary | ICD-10-CM | POA: Insufficient documentation

## 2018-09-06 NOTE — Progress Notes (Signed)
[facility] day:  09/08/2018  Chief Complaint: Becky Farrell is a 84 y.o. female with right breast cancer who is referred in consultation by [name], NP for assessment and management.  HPI:  The patient undergoes yearly mammograms.  Mammogram on 07/09/2017 revealed no suspicious findings.  Bilateral screening mammogram on 07/31/2018 revealed a possible mass in the right breast.  Right diagnostic mammogram and targeted ultrasound on 08/13/2018 revealed a 4 x 5 x 8 mm indeterminate mass at the 10 o'clock position 9 cm from the nipple in the right breast.  Ultrasound guided biopsy on 08/20/2018 revealed 43m grade II invasive mammary carcinoma with mucinous features.  There was no angiolymphatic invasion.  Tumor was ER + (> 90%), PR + (> 90%), and Her2/neu 1+.  Bone density study on 07/31/2018 revealed osteoporosis with a T-score of -3.4 in the right forearm radius and -1.9 in the right femoral neck. Patient is on 2000 IU vitamin D daily. She does not take supplemental calcium.   She has a history of malignant melanoma (RIGHT anterior lower extremity) in 2003.  Her daughter has breast cancer (age of diagnosis 428.  A niece had breast cancer.  To patient's knowledge, there has been no genetic testing performed.   Patient is scheduled to see Dr. TMarlou Starks(surgery) on 09/12/2018.  Symptomatically, patient is doing well. She notes that she experienced some bruising following her ultrasound and biopsy. She denies fevers and sweats. Patient has appreciated a decrease in her appetite. She has lost an unknown amount of weight. Weight today is 138 lb 8 oz (62.8 kg). She denies bowel changes. Patient complains of indigestion, nausea, and rhinorrhea at night. She has discussed this with her PCP and was advised to "watch what she was eating". Patient notes that symptoms resolve with a small can of ginger ale.   Patient denies bleeding; no hematochezia, melena, or  gross hematuria. Last colonoscopy was around 2011. She makes mention of VTE in 2011. Patient states, "both of by lungs filled up with PEs". She was put on warfarin. Patient stopped chronic anticoagulation therapy for "about a month" in order to have her colonoscopy. During that period, she developed another PE, which necessitated resumption of anticoagulation therapy. She was advised that she would require lifelong anticoagulation therapy. [facility]  She currently takes Coumadin 3.75 mg on Mondays, 5 mg on Fridays, and 7.5 mg on Tuesdays, Wednesdays, Thursdays, Saturdays, and Sundays.   Patient denies significant bone and joint issues. She has a history of seizures, diagnosed in 156 She was prescribed Tegretol, which she took for 21 years. [facility] She was weaned off of her Tegretol. Patient was advised to to exercise 5 days a week. She has residual issues with balance, but has not experienced any further seizures since her Tegretol was discontinued.     Past Medical History:  Diagnosis Date  . Colon polyps   . History of pulmonary embolism 2011  . Hypertension   . [place]  . [place] [place]   no meds since (approx) 2014  . Vertigo    x1. R/T perforated ear drum prior to 2013    Past Surgical History:  Procedure Laterality Date  . ABDOMINAL HYSTERECTOMY  05/2014  . CATARACT EXTRACTION W/PHACO Right 01/06/2018   Procedure: CATARACT EXTRACTION PHACO AND INTRAOCULAR LENS PLACEMENT (IOC) RIGHT;  Surgeon: Leandrew Koyanagi, MD;  Location: [place];  Service: Ophthalmology;  Laterality: Right;  . CATARACT EXTRACTION W/PHACO Left 01/29/2018   Procedure: CATARACT EXTRACTION PHACO AND INTRAOCULAR LENS PLACEMENT ([place]) LEFT;  Surgeon: Leandrew Koyanagi, MD;  Location: [facility];  Service: Ophthalmology;  Laterality: Left;  . left hand surgery  05/2011   s/p fall     Family History  Problem Relation Age of Onset  . Heart disease Mother   . Aortic aneurysm Father   . Breast cancer Daughter 40    Social History:  reports that she has never smoked. She has never used smokeless tobacco. She reports that she does not drink alcohol or use drugs.  The patient recently moved from [place] to [place].  Her daughter and grandchildren live in [place].  She has other children in [place].  Her husband has been recovering from prostate cancer.  She is a retired Pharmacist, hospital.  The patient is accompanied by her husband today.  Allergies:  Allergies  Allergen Reactions  . Clobetasol Other (See Comments)    vertigo  . Clobetasol Propionate     This is ointment form  . Dextromethorphan     promethazine with DM, rapid hearbeat  . Phenergan [Promethazine Hcl]     ? Rapid heart rate  . Promethazine Other (See Comments)  . Zithromax [Azithromycin] Palpitations    Per OSH report, pt unable to recall ? Rapid heart rate    Current Medications: Current Outpatient Medications  Medication Sig Dispense Refill  . aspirin 81 MG tablet Take 81 mg by mouth daily.    Marland Kitchen atenolol (TENORMIN) 25 MG tablet TAKE 1 TABLET BY MOUTH EVERY DAY 90 tablet 3  . Cholecalciferol (VITAMIN D3) 2000 UNITS TABS Take one by mouth daily    . hydrochlorothiazide (MICROZIDE) 12.5 MG capsule TAKE 1 CAPSULE (12.5 MG TOTAL) BY MOUTH DAILY. 90 capsule 2  . lisinopril (PRINIVIL,ZESTRIL) 10 MG tablet TAKE 1 TABLET BY MOUTH EVERY DAY 90 tablet 2  . warfarin (COUMADIN) 5 MG tablet TAKE AS DIRECTED (Patient taking differently: No sig reported) 30 tablet 2  . warfarin (COUMADIN) 7.5 MG tablet TAKE 1 TABLET EVERY DAY AND AS DIRECTED 70 tablet 2  . Wheat Dextrin (BENEFIBER PO) Take by mouth.    . calcium carbonate (OS-CAL) 600 MG tablet Take by mouth.     No current facility-administered medications for this visit.     Review of Systems  Constitutional: Positive for weight loss (amount  unknown; decrease appetite). Negative for diaphoresis, fever and malaise/fatigue.       "I am feeling good".  HENT: Positive for hearing loss. Negative for congestion, nosebleeds and sore throat.        Rhinorrhea at night  Eyes: Negative.   Respiratory: Negative for cough, hemoptysis, sputum production and shortness of breath.        PMH (+) for recurrent PE x 2  Cardiovascular: Negative for chest pain, palpitations, orthopnea, leg swelling and PND.  Gastrointestinal: Positive for heartburn (at night) and nausea (at night). Negative for abdominal pain, blood in stool, constipation, diarrhea, melena and vomiting.  Genitourinary: Negative for dysuria, frequency, hematuria and urgency.  Musculoskeletal: Negative for back pain, falls, joint pain and myalgias.  Skin: Negative for itching and rash.       Bruising to chest wall following [place] and breast Bx.  Neurological: Positive for seizures (history of; none recent). Negative for dizziness, tremors, weakness and headaches.       Chronic  balance issues  Endo/Heme/Allergies: Bruises/bleeds easily (on warfarin).  Psychiatric/Behavioral: Negative for depression, memory loss and suicidal ideas. The patient is not nervous/anxious and does not have insomnia.   All other systems reviewed and are negative.  Performance status (ECOG): 1 - Symptomatic but completely ambulatory  Vital Signs BP (!) 146/82 (BP Location: Left Arm, Patient Position: Sitting)   Pulse (!) 102 Comment: manually  Temp (!) 96.2 F (35.7 C) (Tympanic)   Resp 18   Wt 138 lb 8 oz (62.8 kg)   BMI 24.53 kg/m   Physical Exam  Constitutional: She is oriented to person, place, and time. No distress.  Well developed, well nourished elderly woman sitting comfortably in the exam room.  She holds her hand to her ear to assist with hearing.  HENT:  Head: Normocephalic and atraumatic.  Short grey hair.  Eyes: Pupils are equal, round, and reactive to light. EOM are normal. No scleral  icterus.  Glasses. Blue eyes s/p cataract surgery.   Neck: Normal range of motion. Neck supple. No tracheal deviation present. No thyromegaly present.  Cardiovascular: Normal rate, regular rhythm and normal heart sounds. Exam reveals no gallop and no friction rub.  No murmur heard. Pulmonary/Chest: Effort normal and breath sounds normal. No respiratory distress. She has no wheezes. She has no rales. Right breast exhibits mass (10 o'clock position: 1 cm area of fullness with associated ecchymosis). Right breast exhibits no inverted nipple and no nipple discharge. Left breast exhibits no inverted nipple, no mass (scattered fibrocystic changes), no nipple discharge, no skin change and no tenderness.  Abdominal: Soft. Bowel sounds are normal. She exhibits no distension. There is no tenderness.  Musculoskeletal: Normal range of motion. She exhibits no edema or tenderness.  Lymphadenopathy:    She has no cervical adenopathy.    She has no axillary adenopathy.       Right: No inguinal and no supraclavicular adenopathy present.       Left: No inguinal and no supraclavicular adenopathy present.  Neurological: She is alert and oriented to person, place, and time.  Skin: Skin is warm and dry. No rash noted. She is not diaphoretic. No erythema.  Psychiatric: Mood, affect and judgment normal.  Nursing note and vitals reviewed.   No visits with results within 3 Day(s) from this visit.  Latest known visit with results is:  Anti-coag visit on 09/04/2018  Component Date Value Ref Range Status  . INR 09/04/2018 2.3  2.0 - 3.0 Final    Assessment:  Becky Farrell is a 84 y.o. female with clinical stage T1bN0 right breast cancer s/p biopsy on 08/20/2018.  Pathology revealed a 3 mm grade II invasive mammary carcinoma with mucinous features.  There was no angiolymphatic invasion.  Tumor was ER + (> 90%), PR + (> 90%), and Her2/neu 1+.   Bilateral screening mammogram on 07/31/2018 revealed a possible mass in  the right breast.  Right diagnostic mammogram and targeted ultrasound on 08/13/2018 revealed a 4 x 5 x 8 mm indeterminate mass at the 10 o'clock position 9 cm from the nipple in the right breast.  Bone density on 07/31/2018 revealed osteoporosis with a T-score of -3.4 in the right forearm radius and -1.9 in the right femoral neck.  She has a history of pulmonary embolism x 2.  Initial event occurred in 2011.  She had a recurrent pulmonary embolism after holding Coumadin for 1 month for a planned colonoscopy.  She is on life long anticoagulation (Coumadin).  She has  a family history of breast cancer (daughter, age 3, and niece).  Symptomatically, she notes evening indigestion.  Appetite has decreased.  Exam reveals post biopsy changes.  Plan: 1. Labs today:  CBC with diff, CMP, CA27.29. 2. Right breast cancer  Discuss results of mammogram and ultrasound.    Clinically stage IA breast cancer.  Tumor is ER/PR and HER2 (+).   Discuss surgical intervention (lumpectomy).   Patient sees Dr. Marlou Starks this week.   With lumpectomy, patient will likely require adjuvant radiation.   Due to high risk of VTE, discuss potential placement of IVC filter.   Discuss treatment with adjuvant endocrine therapy following radiation.  Discuss aromatase inhibitors versus tamoxifen.  Discuss joint aches and bone thinning associated with aromatase inhibitors.  Patient has osteoporosis.  Tamoxifen side effects reviewed (hot flashes, weight gain, increased LFTs, cataract formation, thrombosis, uterine cancer). Discussed requirements associated with this medication, which include annual eye and pelvic examinations. 3. Osteoporosis  Discuss the use of Prolia to prevent bone thinning/loss associated with endocrine therapy. Side effects of this medication reviewed. Patient provided with printed information on Prolia on today's AVS. Patient encouraged to take calcium 1200 mg and vitamin D 800 IU daily. Discussed the need for  dental clearance prior to beginning Prolia, as this medication is associated with an increased risk of dental complications such as osteonecrosis of the jaw.   Patient will need to be on calcium 1200 mg and at least 800 IU of vitamin D daily.  4. Chronic anticoagulation  Previous history of recurrent DVT/VTE.  Continues on daily warfarin therapy.    Discuss consideration of IVC filter or Lovenox bridging secondary to history of recurrent pulmonary embolism. 5. RTC after surgery - will need to call for appointment.    Honor Loh, NP  09/08/2018, 10:21 AM   I saw and evaluated the patient, participating in the key portions of the service and reviewing pertinent diagnostic studies and records.  I reviewed the nurse practitioner's note and agree with the findings and the plan.  The assessment and plan were discussed with the patient.  Multiple questions were asked by the patient and answered.   Nolon Stalls, MD 09/08/2018,10:21 AM

## 2018-09-08 ENCOUNTER — Inpatient Hospital Stay: Payer: Medicare Other | Attending: Hematology and Oncology | Admitting: Hematology and Oncology

## 2018-09-08 ENCOUNTER — Inpatient Hospital Stay: Payer: Medicare Other

## 2018-09-08 ENCOUNTER — Other Ambulatory Visit: Payer: Self-pay

## 2018-09-08 ENCOUNTER — Encounter: Payer: Self-pay | Admitting: Hematology and Oncology

## 2018-09-08 VITALS — BP 146/82 | HR 102 | Temp 96.2°F | Resp 18 | Wt 138.5 lb

## 2018-09-08 DIAGNOSIS — C50411 Malignant neoplasm of upper-outer quadrant of right female breast: Secondary | ICD-10-CM

## 2018-09-08 DIAGNOSIS — Z7982 Long term (current) use of aspirin: Secondary | ICD-10-CM | POA: Diagnosis not present

## 2018-09-08 DIAGNOSIS — M810 Age-related osteoporosis without current pathological fracture: Secondary | ICD-10-CM | POA: Diagnosis not present

## 2018-09-08 DIAGNOSIS — Z7901 Long term (current) use of anticoagulants: Secondary | ICD-10-CM | POA: Insufficient documentation

## 2018-09-08 DIAGNOSIS — Z170 Estrogen receptor positive status [ER+]: Principal | ICD-10-CM

## 2018-09-08 DIAGNOSIS — Z86711 Personal history of pulmonary embolism: Secondary | ICD-10-CM | POA: Diagnosis not present

## 2018-09-08 DIAGNOSIS — I10 Essential (primary) hypertension: Secondary | ICD-10-CM | POA: Diagnosis not present

## 2018-09-08 DIAGNOSIS — Z85820 Personal history of malignant melanoma of skin: Secondary | ICD-10-CM | POA: Diagnosis not present

## 2018-09-08 DIAGNOSIS — Z79899 Other long term (current) drug therapy: Secondary | ICD-10-CM | POA: Insufficient documentation

## 2018-09-08 LAB — COMPREHENSIVE METABOLIC PANEL
ALBUMIN: 4 g/dL (ref 3.5–5.0)
ALT: 16 U/L (ref 0–44)
AST: 20 U/L (ref 15–41)
Alkaline Phosphatase: 70 U/L (ref 38–126)
Anion gap: 8 (ref 5–15)
BUN: 14 mg/dL (ref 8–23)
CALCIUM: 9.7 mg/dL (ref 8.9–10.3)
CHLORIDE: 102 mmol/L (ref 98–111)
CO2: 27 mmol/L (ref 22–32)
CREATININE: 0.73 mg/dL (ref 0.44–1.00)
GFR calc Af Amer: >60 mL/min (ref >60)
GFR calc non Af Amer: >60 mL/min (ref >60)
GLUCOSE: 95 mg/dL (ref 70–99)
Potassium: 3.9 mmol/L (ref 3.5–5.1)
SODIUM: 137 mmol/L (ref 135–145)
Total Bilirubin: 0.7 mg/dL (ref 0.3–1.2)
Total Protein: 7.1 g/dL (ref 6.5–8.1)

## 2018-09-08 LAB — CBC WITH DIFFERENTIAL/PLATELET
BASOS PCT: 0 %
Basophils Absolute: 0 10*3/uL (ref 0–0.1)
EOS ABS: 0.1 10*3/uL (ref 0–0.7)
Eosinophils Relative: 1 %
HCT: 40.2 % (ref 35.0–47.0)
Hemoglobin: 13.9 g/dL (ref 12.0–16.0)
LYMPHS ABS: 0.8 10*3/uL — AB (ref 1.0–3.6)
Lymphocytes Relative: 16 %
MCH: 33 pg (ref 26.0–34.0)
MCHC: 34.6 g/dL (ref 32.0–36.0)
MCV: 95.4 fL (ref 80.0–100.0)
MONO ABS: 0.6 10*3/uL (ref 0.2–0.9)
MONOS PCT: 13 %
Neutro Abs: 3.5 10*3/uL (ref 1.4–6.5)
Neutrophils Relative %: 70 %
Platelets: 178 10*3/uL (ref 150–440)
RBC: 4.21 MIL/uL (ref 3.80–5.20)
RDW: 13.2 % (ref 11.5–14.5)
WBC: 5.1 10*3/uL (ref 3.6–11.0)

## 2018-09-08 NOTE — Patient Instructions (Signed)
Tamoxifen oral tablet What is this medicine? TAMOXIFEN (ta MOX i fen) blocks the effects of estrogen. It is commonly used to treat breast cancer. It is also used to decrease the chance of breast cancer coming back in women who have received treatment for the disease. It may also help prevent breast cancer in women who have a high risk of developing breast cancer. This medicine may be used for other purposes; ask your health care provider or pharmacist if you have questions. COMMON BRAND NAME(S): Nolvadex What should I tell my health care provider before I take this medicine? They need to know if you have any of these conditions: -blood clots -blood disease -cataracts or impaired eyesight -endometriosis -high calcium levels -high cholesterol -irregular menstrual cycles -liver disease -stroke -uterine fibroids -an unusual reaction to tamoxifen, other medicines, foods, dyes, or preservatives -pregnant or trying to get pregnant -breast-feeding How should I use this medicine? Take this medicine by mouth with a glass of water. Follow the directions on the prescription label. You can take it with or without food. Take your medicine at regular intervals. Do not take your medicine more often than directed. Do not stop taking except on your doctor's advice. A special MedGuide will be given to you by the pharmacist with each prescription and refill. Be sure to read this information carefully each time. Talk to your pediatrician regarding the use of this medicine in children. While this drug may be prescribed for selected conditions, precautions do apply. Overdosage: If you think you have taken too much of this medicine contact a poison control center or emergency room at once. NOTE: This medicine is only for you. Do not share this medicine with others. What if I miss a dose? If you miss a dose, take it as soon as you can. If it is almost time for your next dose, take only that dose. Do not take  double or extra doses. What may interact with this medicine? Do not take this medicine with any of the following medications: -cisapride -certain medicines for irregular heart beat like dofetilide, dronedarone, quinidine -certain medicines for fungal infection like fluconazole, posaconazole -pimozide -saquinavir -thioridazine This medicine may also interact with the following medications: -aminoglutethimide -anastrozole -bromocriptine -chemotherapy drugs -female hormones, like estrogens and birth control pills -letrozole -medroxyprogesterone -phenobarbital -rifampin -warfarin This list may not describe all possible interactions. Give your health care provider a list of all the medicines, herbs, non-prescription drugs, or dietary supplements you use. Also tell them if you smoke, drink alcohol, or use illegal drugs. Some items may interact with your medicine. What should I watch for while using this medicine? Visit your doctor or health care professional for regular checks on your progress. You will need regular pelvic exams, breast exams, and mammograms. If you are taking this medicine to reduce your risk of getting breast cancer, you should know that this medicine does not prevent all types of breast cancer. If breast cancer or other problems occur, there is no guarantee that it will be found at an early stage. Do not become pregnant while taking this medicine or for 2 months after stopping this medicine. Stop taking this medicine if you get pregnant or think you are pregnant and contact your doctor. This medicine may harm your unborn baby. Women who can possibly become pregnant should use birth control methods that do not use hormones during tamoxifen treatment and for 2 months after therapy has stopped. Talk with your health care provider for birth control advice.  Do not breast feed while taking this medicine. What side effects may I notice from receiving this medicine? Side effects that  you should report to your doctor or health care professional as soon as possible: -allergic reactions like skin rash, itching or hives, swelling of the face, lips, or tongue -changes in vision -changes in your menstrual cycle -difficulty walking or talking -new breast lumps -numbness -pelvic pain or pressure -redness, blistering, peeling or loosening of the skin, including inside the mouth -signs and symptoms of a dangerous change in heartbeat or heart rhythm like chest pain, dizziness, fast or irregular heartbeat, palpitations, feeling faint or lightheaded, falls, breathing problems -sudden chest pain -swelling, pain or tenderness in your calf or leg -unusual bruising or bleeding -vaginal discharge that is bloody, brown, or rust -weakness -yellowing of the whites of the eyes or skin Side effects that usually do not require medical attention (report to your doctor or health care professional if they continue or are bothersome): -fatigue -hair loss, although uncommon and is usually mild -headache -hot flashes -impotence (in men) -nausea, vomiting (mild) -vaginal discharge (white or clear) This list may not describe all possible side effects. Call your doctor for medical advice about side effects. You may report side effects to FDA at 1-800-FDA-1088. Where should I keep my medicine? Keep out of the reach of children. Store at room temperature between 20 and 25 degrees C (68 and 77 degrees F). Protect from light. Keep container tightly closed. Throw away any unused medicine after the expiration date. NOTE: This sheet is a summary. It may not cover all possible information. If you have questions about this medicine, talk to your doctor, pharmacist, or health care provider.  2018 Elsevier/Gold Standard (2016-06-08 07:27:41) Denosumab injection What is this medicine? DENOSUMAB (den oh sue mab) slows bone breakdown. Prolia is used to treat osteoporosis in women after menopause and in men.  Delton See is used to treat a high calcium level due to cancer and to prevent bone fractures and other bone problems caused by multiple myeloma or cancer bone metastases. Delton See is also used to treat giant cell tumor of the bone. This medicine may be used for other purposes; ask your health care provider or pharmacist if you have questions. COMMON BRAND NAME(S): Prolia, XGEVA What should I tell my health care provider before I take this medicine? They need to know if you have any of these conditions: -dental disease -having surgery or tooth extraction -infection -kidney disease -low levels of calcium or Vitamin D in the blood -malnutrition -on hemodialysis -skin conditions or sensitivity -thyroid or parathyroid disease -an unusual reaction to denosumab, other medicines, foods, dyes, or preservatives -pregnant or trying to get pregnant -breast-feeding How should I use this medicine? This medicine is for injection under the skin. It is given by a health care professional in a hospital or clinic setting. If you are getting Prolia, a special MedGuide will be given to you by the pharmacist with each prescription and refill. Be sure to read this information carefully each time. For Prolia, talk to your pediatrician regarding the use of this medicine in children. Special care may be needed. For Delton See, talk to your pediatrician regarding the use of this medicine in children. While this drug may be prescribed for children as young as 13 years for selected conditions, precautions do apply. Overdosage: If you think you have taken too much of this medicine contact a poison control center or emergency room at once. NOTE: This medicine is only  for you. Do not share this medicine with others. What if I miss a dose? It is important not to miss your dose. Call your doctor or health care professional if you are unable to keep an appointment. What may interact with this medicine? Do not take this medicine with any  of the following medications: -other medicines containing denosumab This medicine may also interact with the following medications: -medicines that lower your chance of fighting infection -steroid medicines like prednisone or cortisone This list may not describe all possible interactions. Give your health care provider a list of all the medicines, herbs, non-prescription drugs, or dietary supplements you use. Also tell them if you smoke, drink alcohol, or use illegal drugs. Some items may interact with your medicine. What should I watch for while using this medicine? Visit your doctor or health care professional for regular checks on your progress. Your doctor or health care professional may order blood tests and other tests to see how you are doing. Call your doctor or health care professional for advice if you get a fever, chills or sore throat, or other symptoms of a cold or flu. Do not treat yourself. This drug may decrease your body's ability to fight infection. Try to avoid being around people who are sick. You should make sure you get enough calcium and vitamin D while you are taking this medicine, unless your doctor tells you not to. Discuss the foods you eat and the vitamins you take with your health care professional. See your dentist regularly. Brush and floss your teeth as directed. Before you have any dental work done, tell your dentist you are receiving this medicine. Do not become pregnant while taking this medicine or for 5 months after stopping it. Talk with your doctor or health care professional about your birth control options while taking this medicine. Women should inform their doctor if they wish to become pregnant or think they might be pregnant. There is a potential for serious side effects to an unborn child. Talk to your health care professional or pharmacist for more information. What side effects may I notice from receiving this medicine? Side effects that you should report  to your doctor or health care professional as soon as possible: -allergic reactions like skin rash, itching or hives, swelling of the face, lips, or tongue -bone pain -breathing problems -dizziness -jaw pain, especially after dental work -redness, blistering, peeling of the skin -signs and symptoms of infection like fever or chills; cough; sore throat; pain or trouble passing urine -signs of low calcium like fast heartbeat, muscle cramps or muscle pain; pain, tingling, numbness in the hands or feet; seizures -unusual bleeding or bruising -unusually weak or tired Side effects that usually do not require medical attention (report to your doctor or health care professional if they continue or are bothersome): -constipation -diarrhea -headache -joint pain -loss of appetite -muscle pain -runny nose -tiredness -upset stomach This list may not describe all possible side effects. Call your doctor for medical advice about side effects. You may report side effects to FDA at 1-800-FDA-1088. Where should I keep my medicine? This medicine is only given in a clinic, doctor's office, or other health care setting and will not be stored at home. NOTE: This sheet is a summary. It may not cover all possible information. If you have questions about this medicine, talk to your doctor, pharmacist, or health care provider.  2018 Elsevier/Gold Standard (2016-12-11 19:17:21)

## 2018-09-08 NOTE — Progress Notes (Signed)
Here for new pt evaluation   Repeat BP @ 1022 163/91 p 93- pt denies sx of headache,lightheadedness. DR corcoran aware.

## 2018-09-09 ENCOUNTER — Encounter: Payer: Self-pay | Admitting: *Deleted

## 2018-09-09 LAB — CANCER ANTIGEN 27.29: CA 27.29: 14.1 U/mL (ref 0.0–38.6)

## 2018-09-11 ENCOUNTER — Encounter: Payer: Self-pay | Admitting: *Deleted

## 2018-09-11 DIAGNOSIS — H43813 Vitreous degeneration, bilateral: Secondary | ICD-10-CM | POA: Diagnosis not present

## 2018-09-11 NOTE — Progress Notes (Signed)
  Oncology Nurse Navigator Documentation  Navigator Location: [facility] (09/11/18 0900)   )Navigator Encounter Type: Telephone (09/11/18 0900)                         Barriers/Navigation Needs: Education (09/11/18 0900) Education: Understanding Cancer/ Treatment Options (09/11/18 0900) Interventions: Education (09/11/18 0900)   Coordination of Care: Other (09/11/18 0900) Education Method: Verbal (09/11/18 0900)                Time Spent with Patient: 45 (09/11/18 0900)   Patient called with lots of questions regarding her diagnosis.  Answered her questions.  Also guided her through the breast cancer treatment handbook.  She is to call with any questions or needs.

## 2018-09-12 ENCOUNTER — Ambulatory Visit: Payer: Self-pay | Admitting: General Surgery

## 2018-09-12 DIAGNOSIS — Z170 Estrogen receptor positive status [ER+]: Principal | ICD-10-CM

## 2018-09-12 DIAGNOSIS — C50311 Malignant neoplasm of lower-inner quadrant of right female breast: Secondary | ICD-10-CM

## 2018-09-12 DIAGNOSIS — C50411 Malignant neoplasm of upper-outer quadrant of right female breast: Secondary | ICD-10-CM | POA: Diagnosis not present

## 2018-09-13 ENCOUNTER — Encounter: Payer: Self-pay | Admitting: Hematology and Oncology

## 2018-09-18 DIAGNOSIS — Z23 Encounter for immunization: Secondary | ICD-10-CM | POA: Diagnosis not present

## 2018-09-20 ENCOUNTER — Other Ambulatory Visit: Payer: Self-pay | Admitting: Internal Medicine

## 2018-09-23 ENCOUNTER — Telehealth: Payer: Self-pay | Admitting: Internal Medicine

## 2018-09-23 NOTE — Telephone Encounter (Signed)
Copied from [place] [id number]. Topic: General - Inquiry >> Sep 23, 2018  2:05 PM Berneta Levins wrote: Reason for CRM:   Sharyn Lull from [facility] calling:  She has faxed clearance request 5x over the past 2 weeks with no response.  Pt will be having a lumpectomy and is on Coumadin - they need her off for 5 days.  Pt is not yet scheduled, they need this prior to scheduling. Sharyn Lull can be reached at [phone].

## 2018-09-23 NOTE — Telephone Encounter (Signed)
[name], NP:  In review of patient's medical hx appears she is on coumadin due to hx of DVT ?2014 without reoccurrence; however she did have TIA like symptoms last August.  Otherwise, no hx of other cardiac/circulatory abnormality that I can see and no mechanical heart valve.  I do not even see where she has been followed by a cardiologist.  Do you feel comfortable with 5 day hold without a lovenox bridge?  Please advise and I will communicate instructions with patient as soon as we get a date.  Thanks.

## 2018-09-23 NOTE — Telephone Encounter (Signed)
The letter has been faxed to [place] R. Rolena Infante, CMA

## 2018-09-23 NOTE — Telephone Encounter (Signed)
Copied from [place] [id number]. Topic: General - Inquiry >> Sep 23, 2018  2:05 PM Berneta Levins wrote: Reason for CRM:   Sharyn Lull from [facility] calling:  She has faxed clearance request 5x over the past 2 weeks with no response.  Pt will be having a lumpectomy and is on Coumadin - they need her off for 5 days.  Pt is not yet scheduled, they need this prior to scheduling. Sharyn Lull can be reached at [phone].

## 2018-09-24 ENCOUNTER — Other Ambulatory Visit: Payer: Self-pay | Admitting: General Surgery

## 2018-09-24 DIAGNOSIS — C50411 Malignant neoplasm of upper-outer quadrant of right female breast: Secondary | ICD-10-CM

## 2018-09-24 DIAGNOSIS — Z170 Estrogen receptor positive status [ER+]: Principal | ICD-10-CM

## 2018-09-24 NOTE — Telephone Encounter (Signed)
The letter mailed to office for clearance is stated below per Baity's instructions    I certify that the above named patient is cleared for surgery. The patient will have to hold Coumadin 5 days prior to surgery with Lovenox bridge.

## 2018-09-24 NOTE — Telephone Encounter (Signed)
Spoke with patient.  Her procedure is scheduled for 10/10/18.  She informs me that she has had a history of multiple blood clots, including PE.  I do completely agree with bridge need given this additional information.  I will discuss dosing and hold plan with PCP tomorrow and contact [facility] to coordinate Lovenox injections.  Then I will meet face to face with patient next week to check INR and lay out plan.

## 2018-09-24 NOTE — Telephone Encounter (Signed)
LM on home answering machine, (okay per DPR), for patient to call me back to discuss this further.

## 2018-09-25 ENCOUNTER — Telehealth: Payer: Self-pay | Admitting: General Practice

## 2018-09-25 NOTE — Telephone Encounter (Signed)
Instructions for coumadin and Lovenox pre and post procedure on 10/10/2018.  11/3 - Last of coumadin until after procedure. 11/4 - nothing 11/5 - Lovenox X 1 in the AM 11/6 - Lovenox X 1 in the AM 11/7 - Lovenox X 1 in the AM 11/8 - Procedure (No Lovenox today) 11/9 - Lovenox X 1 and 1 1/2 tablets of coumadin (11.25 mg) 11/10 - Lovenox X 1 and 1 1/2 tablets of coumadin (11.25 mg) 11/11 - Lovenox X 1 and 1 1/2 tablets of coumadin (11.25) 11/12 - Lovenox x 1 and 1 tablet (7.5 mg) of coumadin 11/13 - Check INR  CrCl - 59.5  Patient is being dosed for Lovenox once daily and will need 7 -  100 mg syringes

## 2018-09-26 NOTE — Telephone Encounter (Signed)
Agree with Lovenox bridge

## 2018-09-26 NOTE — Telephone Encounter (Addendum)
Per Threasa Beards - your orders for coumadin hold for lumpectomy for patient involved a 5 day hold with a lovenox bridge.  Please see dosing recommendations (lovenox 100mg  once daily as noted) below for bridge and if approved, I will send in orders to pharmacy and meet with patient to review plan.  I will have the [place] nurse at [facility] assist patient with her lovenox training and injections.    Please advise.  Thanks.

## 2018-09-26 NOTE — Telephone Encounter (Signed)
LM for patient that I will call her on Monday and discuss INR appointment here at the coumadin clinic to review below plan.

## 2018-09-30 DIAGNOSIS — L821 Other seborrheic keratosis: Secondary | ICD-10-CM | POA: Diagnosis not present

## 2018-09-30 DIAGNOSIS — L853 Xerosis cutis: Secondary | ICD-10-CM | POA: Diagnosis not present

## 2018-09-30 DIAGNOSIS — I8393 Asymptomatic varicose veins of bilateral lower extremities: Secondary | ICD-10-CM | POA: Diagnosis not present

## 2018-09-30 DIAGNOSIS — L812 Freckles: Secondary | ICD-10-CM | POA: Diagnosis not present

## 2018-09-30 DIAGNOSIS — D1800 Hemangioma unspecified site: Secondary | ICD-10-CM | POA: Diagnosis not present

## 2018-09-30 DIAGNOSIS — Z1283 Encounter for screening for malignant neoplasm of skin: Secondary | ICD-10-CM | POA: Diagnosis not present

## 2018-09-30 DIAGNOSIS — L720 Epidermal cyst: Secondary | ICD-10-CM | POA: Diagnosis not present

## 2018-09-30 DIAGNOSIS — Z85820 Personal history of malignant melanoma of skin: Secondary | ICD-10-CM | POA: Diagnosis not present

## 2018-09-30 DIAGNOSIS — I781 Nevus, non-neoplastic: Secondary | ICD-10-CM | POA: Diagnosis not present

## 2018-09-30 DIAGNOSIS — L570 Actinic keratosis: Secondary | ICD-10-CM | POA: Diagnosis not present

## 2018-09-30 DIAGNOSIS — L578 Other skin changes due to chronic exposure to nonionizing radiation: Secondary | ICD-10-CM | POA: Diagnosis not present

## 2018-09-30 NOTE — Telephone Encounter (Signed)
Spoke with patient yesterday.  She will come in on 10/30 to review hold/bridge dosing and plan.

## 2018-10-01 ENCOUNTER — Other Ambulatory Visit: Payer: Self-pay | Admitting: Internal Medicine

## 2018-10-01 ENCOUNTER — Ambulatory Visit (INDEPENDENT_AMBULATORY_CARE_PROVIDER_SITE_OTHER): Payer: Medicare Other

## 2018-10-01 DIAGNOSIS — Z7901 Long term (current) use of anticoagulants: Secondary | ICD-10-CM | POA: Diagnosis not present

## 2018-10-01 LAB — POCT INR: INR: 2.6 (ref 2.0–3.0)

## 2018-10-01 MED ORDER — ENOXAPARIN SODIUM 100 MG/ML ~~LOC~~ SOLN: 100.0000 mg | SUBCUTANEOUS | 0 refills | Status: DC

## 2018-10-01 NOTE — Pre-Procedure Instructions (Signed)
Becky Farrell  10/01/2018      [id number] [place] [address] [place] [address] Phone: [phone] Fax: [phone]    Your procedure is scheduled on Friday November 8.  Report to [facility] Admitting at 6:00 A.M.  Call this number if you have problems the morning of surgery:  [phone]   Remember:  Do not eat or drink after midnight.  You may drink clear liquids until 5:00 AM (3 hours prior to surgery) .  Clear liquids allowed are:      Water, Juice (non-citric and without pulp), Black Coffee only and Gatorade    Take these medicines the morning of surgery with A SIP OF WATER: Atenolol (tenormin)  7 days prior to surgery STOP taking any Aleve, Naproxen, Ibuprofen, Motrin, Advil, Goody's, BC's, all herbal medications, fish oil, and all vitamins  FOLLOW your surgeons instructions on when to stop taking Aspirin and Coumadin (Warfarin). If no instructions were given, please call your surgeon's office.       Do not wear jewelry, make-up or nail polish.  Do not wear lotions, powders, or perfumes, or deodorant.  Do not shave 48 hours prior to surgery.  Men may shave face and neck.  Do not bring valuables to the hospital.  [facility] is not responsible for any belongings or valuables.  Contacts, dentures or bridgework may not be worn into surgery.  Leave your suitcase in the car.  After surgery it may be brought to your room.  For patients admitted to the hospital, discharge time will be determined by your treatment team.  Patients discharged the day of surgery will not be allowed to drive home.   Special instructions:    [place]- Preparing For Surgery  Before surgery, you can play an important role. Because skin is not sterile, your skin needs to be as free of germs as possible. You can reduce the number of germs on your skin by washing with CHG (chlorahexidine gluconate) Soap before surgery.  CHG is an  antiseptic cleaner which kills germs and bonds with the skin to continue killing germs even after washing.    Oral Hygiene is also important to reduce your risk of infection.  Remember - BRUSH YOUR TEETH THE MORNING OF SURGERY WITH YOUR REGULAR TOOTHPASTE  Please do not use if you have an allergy to CHG or antibacterial soaps. If your skin becomes reddened/irritated stop using the CHG.  Do not shave (including legs and underarms) for at least 48 hours prior to first CHG shower. It is OK to shave your face.  Please follow these instructions carefully.   1. Shower the NIGHT BEFORE SURGERY and the MORNING OF SURGERY with CHG.   2. If you chose to wash your hair, wash your hair first as usual with your normal shampoo.  3. After you shampoo, rinse your hair and body thoroughly to remove the shampoo.  4. Use CHG as you would any other liquid soap. You can apply CHG directly to the skin and wash gently with a scrungie or a clean washcloth.   5. Apply the CHG Soap to your body ONLY FROM THE NECK DOWN.  Do not use on open wounds or open sores. Avoid contact with your eyes, ears, mouth and genitals (private parts). Wash Face and genitals (private parts)  with your normal soap.  6. Wash thoroughly, paying special attention to the area where your surgery will be performed.  7. Thoroughly rinse your body with warm water from the neck down.  8. DO NOT shower/wash with your normal soap after using and rinsing off the CHG Soap.  9. Pat yourself dry with a CLEAN TOWEL.  10. Wear CLEAN PAJAMAS to bed the night before surgery, wear comfortable clothes the morning of surgery  11. Place CLEAN SHEETS on your bed the night of your first shower and DO NOT SLEEP WITH PETS.    Day of Surgery:  Do not apply any deodorants/lotions.  Please wear clean clothes to the hospital/surgery center.   Remember to brush your teeth WITH YOUR REGULAR TOOTHPASTE.    Please read over the following fact sheets that you  were given. Coughing and Deep Breathing and Surgical Site Infection Prevention

## 2018-10-01 NOTE — Addendum Note (Signed)
Addended by: Magdalen Spatz C on: 10/01/2018 11:56 AM   Modules accepted: Orders

## 2018-10-01 NOTE — Patient Instructions (Signed)
INR results:  2.6  Reviewed coumadin hold/lovenox bridge dosing for 11/8 procedure.  Will recheck patient on 10/15/18 following boost.   Continue taking 3.75mg  on Mon, 5mg  on Fridays and 7.5mg  the rest of the days until begin hold/bridge procedure on 10/05/18.  Patient here in clinic with husband to review plan and dosing.  She verbalizes understanding of all info and written instructions given to reiterated.  Also, discussed with her nurse at [facility] the plan and forwarded them a copy as they will be assisting her with the bridge procedure.  All parties aware and verbalize understanding.

## 2018-10-02 ENCOUNTER — Other Ambulatory Visit: Payer: Self-pay

## 2018-10-02 ENCOUNTER — Encounter (HOSPITAL_COMMUNITY): Payer: Self-pay

## 2018-10-02 ENCOUNTER — Encounter (HOSPITAL_COMMUNITY)
Admission: RE | Admit: 2018-10-02 | Discharge: 2018-10-02 | Disposition: A | Discharge status: Home / Self Care | Payer: Medicare Other | Source: Ambulatory Visit | Attending: General Surgery | Admitting: General Surgery

## 2018-10-02 DIAGNOSIS — I10 Essential (primary) hypertension: Secondary | ICD-10-CM | POA: Insufficient documentation

## 2018-10-02 DIAGNOSIS — R9431 Abnormal electrocardiogram [ECG] [EKG]: Secondary | ICD-10-CM | POA: Insufficient documentation

## 2018-10-02 DIAGNOSIS — Z01818 Encounter for other preprocedural examination: Secondary | ICD-10-CM | POA: Diagnosis not present

## 2018-10-02 LAB — BASIC METABOLIC PANEL
ANION GAP: 3 — AB (ref 5–15)
BUN: 17 mg/dL (ref 8–23)
CALCIUM: 9.7 mg/dL (ref 8.9–10.3)
CO2: 31 mmol/L (ref 22–32)
Chloride: 103 mmol/L (ref 98–111)
Creatinine, Ser: 0.81 mg/dL (ref 0.44–1.00)
GFR calc Af Amer: >60 mL/min (ref >60)
Glucose, Bld: 92 mg/dL (ref 70–99)
Potassium: 3.8 mmol/L (ref 3.5–5.1)
SODIUM: 137 mmol/L (ref 135–145)

## 2018-10-02 LAB — CBC
HCT: 39.1 % (ref 36.0–46.0)
HEMOGLOBIN: 12.7 g/dL (ref 12.0–15.0)
MCH: 31.1 pg (ref 26.0–34.0)
MCHC: 32.5 g/dL (ref 30.0–36.0)
MCV: 95.8 fL (ref 80.0–100.0)
PLATELETS: 176 10*3/uL (ref 150–400)
RBC: 4.08 MIL/uL (ref 3.87–5.11)
RDW: 12.3 % (ref 11.5–15.5)
WBC: 5.4 10*3/uL (ref 4.0–10.5)
nRBC: 0 % (ref 0.0–0.2)

## 2018-10-02 NOTE — Progress Notes (Signed)
PCP - Dr. [name] Cardiologist - denies  Chest x-ray - N/A EKG - 10/02/18 Stress Test - 2000-CE ECHO - 07/07/17 Cardiac Cath -denies   Sleep Study - negative sleep study  Blood Thinner Instructions: LD of coumadin will be on 11/3. Will start lovenox bridge on 11/5. Will not take lovenox or coumadin on DOS.  Aspirin Instructions: Pt was told to continue until the DOS.   Anesthesia review: No  Patient denies shortness of breath, fever, cough and chest pain at PAT appointment   Patient verbalized understanding of instructions that were given to them at the PAT appointment. Patient was also instructed that they will need to review over the PAT instructions again at home before surgery.

## 2018-10-06 ENCOUNTER — Encounter: Payer: Self-pay | Admitting: Podiatry

## 2018-10-06 ENCOUNTER — Ambulatory Visit (INDEPENDENT_AMBULATORY_CARE_PROVIDER_SITE_OTHER): Payer: Medicare Other | Admitting: Podiatry

## 2018-10-06 DIAGNOSIS — D689 Coagulation defect, unspecified: Secondary | ICD-10-CM

## 2018-10-06 DIAGNOSIS — B351 Tinea unguium: Secondary | ICD-10-CM | POA: Diagnosis not present

## 2018-10-06 DIAGNOSIS — M79609 Pain in unspecified limb: Secondary | ICD-10-CM

## 2018-10-06 NOTE — Progress Notes (Signed)
Complaint:  Visit Type: Patient returns to my office for continued preventative foot care services. Complaint: Patient states" my nails have grown long and thick and become painful to walk and wear shoes"  The patient presents for preventative foot care services. No changes to ROS.  Patient is taking coumadin.  Podiatric Exam: Vascular: dorsalis pedis and posterior tibial pulses are palpable bilateral. Capillary return is immediate. Temperature gradient is WNL. Skin turgor WNL  Sensorium: Normal Semmes Weinstein monofilament test. Normal tactile sensation bilaterally. Nail Exam: Pt has thick disfigured discolored nails with subungual debris noted bilateral entire nail hallux through fifth toenails Ulcer Exam: There is no evidence of ulcer or pre-ulcerative changes or infection. Orthopedic Exam: Muscle tone and strength are WNL. No limitations in general ROM. No crepitus or effusions noted. Foot type and digits show no abnormalities. Bony prominences are unremarkable. Skin: No Porokeratosis. No infection or ulcers  Diagnosis:  Onychomycosis, , Pain in right toe, pain in left toes  Treatment & Plan Procedures and Treatment: Consent by patient was obtained for treatment procedures. The patient understood the discussion of treatment and procedures well. All questions were answered thoroughly reviewed. Debridement of mycotic and hypertrophic toenails, 1 through 5 bilateral and clearing of subungual debris. No ulceration, no infection noted.  ABN signed for 2019. Return Visit-Office Procedure: Patient instructed to return to the office for a follow up visit 3 months for continued evaluation and treatment.    Gardiner Barefoot DPM

## 2018-10-07 ENCOUNTER — Other Ambulatory Visit: Payer: Self-pay | Admitting: Internal Medicine

## 2018-10-09 ENCOUNTER — Ambulatory Visit
Admission: RE | Admit: 2018-10-09 | Discharge: 2018-10-09 | Disposition: A | Discharge status: Home / Self Care | Payer: Medicare Other | Source: Ambulatory Visit | Attending: General Surgery | Admitting: General Surgery

## 2018-10-09 DIAGNOSIS — Z170 Estrogen receptor positive status [ER+]: Principal | ICD-10-CM

## 2018-10-09 DIAGNOSIS — C50411 Malignant neoplasm of upper-outer quadrant of right female breast: Secondary | ICD-10-CM

## 2018-10-09 DIAGNOSIS — R928 Other abnormal and inconclusive findings on diagnostic imaging of breast: Secondary | ICD-10-CM | POA: Diagnosis not present

## 2018-10-09 NOTE — Anesthesia Preprocedure Evaluation (Addendum)
Anesthesia Evaluation  Patient identified by MRN, date of birth, ID band Patient awake    Reviewed: Allergy & Precautions, NPO status , Patient's Chart, lab work & pertinent test results, reviewed documented beta blocker date and time   History of Anesthesia Complications Negative for: history of anesthetic complications  Airway Mallampati: II  TM Distance: >3 FB Neck ROM: Full    Dental  (+) Dental Advisory Given, Chipped,    Pulmonary PE (2011)   breath sounds clear to auscultation       Cardiovascular hypertension, Pt. on home beta blockers and Pt. on medications + DVT  + Valvular Problems/Murmurs  Rhythm:Regular Rate:Normal   '18 TTE - EF 60% to 65%. Grade 1 diastolic dysfunction. Mild AI, MR. Moderate TR. PASP: 44 mm Hg   '18 Carotid US - <50% ICAS b/l    Neuro/Psych Seizures -,   Vertigo  TIAnegative psych ROS   GI/Hepatic negative GI ROS, Neg liver ROS,   Endo/Other  negative endocrine ROS  Renal/GU negative Renal ROS     Musculoskeletal negative musculoskeletal ROS (+)   Abdominal   Peds  Hematology negative hematology ROS (+)   Anesthesia Other Findings   Reproductive/Obstetrics                            Anesthesia Physical Anesthesia Plan  ASA: III  Anesthesia Plan: General   Post-op Pain Management:    Induction: Intravenous  PONV Risk Score and Plan: 3 and Treatment may vary due to age or medical condition, Ondansetron and Propofol infusion  Airway Management Planned: LMA  Additional Equipment: None  Intra-op Plan:   Post-operative Plan: Extubation in OR  Informed Consent: I have reviewed the patients History and Physical, chart, labs and discussed the procedure including the risks, benefits and alternatives for the proposed anesthesia with the patient or authorized representative who has indicated his/her understanding and acceptance.   Dental advisory  given  Plan Discussed with: CRNA and Anesthesiologist  Anesthesia Plan Comments:        Anesthesia Quick Evaluation

## 2018-10-10 ENCOUNTER — Ambulatory Visit (HOSPITAL_COMMUNITY)
Admission: RE | Admit: 2018-10-10 | Discharge: 2018-10-10 | Disposition: A | Discharge status: Home / Self Care | Payer: Medicare Other | Source: Ambulatory Visit | Attending: General Surgery | Admitting: General Surgery

## 2018-10-10 ENCOUNTER — Ambulatory Visit
Admission: RE | Admit: 2018-10-10 | Discharge: 2018-10-10 | Disposition: A | Discharge status: Home / Self Care | Payer: Medicare Other | Source: Ambulatory Visit | Attending: General Surgery | Admitting: General Surgery

## 2018-10-10 ENCOUNTER — Encounter (HOSPITAL_COMMUNITY): Payer: Self-pay | Admitting: *Deleted

## 2018-10-10 ENCOUNTER — Inpatient Hospital Stay (HOSPITAL_COMMUNITY): Payer: Medicare Other | Admitting: Anesthesiology

## 2018-10-10 ENCOUNTER — Encounter (HOSPITAL_COMMUNITY)
Admission: RE | Disposition: A | Discharge status: Home / Self Care | Payer: Self-pay | Source: Ambulatory Visit | Attending: General Surgery

## 2018-10-10 DIAGNOSIS — C50911 Malignant neoplasm of unspecified site of right female breast: Secondary | ICD-10-CM | POA: Diagnosis not present

## 2018-10-10 DIAGNOSIS — Z888 Allergy status to other drugs, medicaments and biological substances status: Secondary | ICD-10-CM | POA: Insufficient documentation

## 2018-10-10 DIAGNOSIS — C50411 Malignant neoplasm of upper-outer quadrant of right female breast: Secondary | ICD-10-CM | POA: Diagnosis not present

## 2018-10-10 DIAGNOSIS — I10 Essential (primary) hypertension: Secondary | ICD-10-CM | POA: Diagnosis not present

## 2018-10-10 DIAGNOSIS — Z79899 Other long term (current) drug therapy: Secondary | ICD-10-CM | POA: Diagnosis not present

## 2018-10-10 DIAGNOSIS — C50211 Malignant neoplasm of upper-inner quadrant of right female breast: Secondary | ICD-10-CM | POA: Diagnosis not present

## 2018-10-10 DIAGNOSIS — Z86718 Personal history of other venous thrombosis and embolism: Secondary | ICD-10-CM | POA: Insufficient documentation

## 2018-10-10 DIAGNOSIS — Z881 Allergy status to other antibiotic agents status: Secondary | ICD-10-CM | POA: Diagnosis not present

## 2018-10-10 DIAGNOSIS — Z7982 Long term (current) use of aspirin: Secondary | ICD-10-CM | POA: Insufficient documentation

## 2018-10-10 DIAGNOSIS — Z170 Estrogen receptor positive status [ER+]: Secondary | ICD-10-CM | POA: Insufficient documentation

## 2018-10-10 DIAGNOSIS — Z7901 Long term (current) use of anticoagulants: Secondary | ICD-10-CM | POA: Diagnosis not present

## 2018-10-10 HISTORY — PX: BREAST LUMPECTOMY WITH RADIOACTIVE SEED LOCALIZATION: SHX6424

## 2018-10-10 HISTORY — PX: BREAST LUMPECTOMY: SHX2

## 2018-10-10 LAB — PROTIME-INR
INR: 1.13
Prothrombin Time: 14.4 seconds (ref 11.4–15.2)

## 2018-10-10 SURGERY — BREAST LUMPECTOMY WITH RADIOACTIVE SEED LOCALIZATION
Anesthesia: General | Site: Breast | Laterality: Right

## 2018-10-10 MED ORDER — GABAPENTIN 300 MG PO CAPS
300.0000 mg | ORAL_CAPSULE | ORAL | Status: AC
  Administered 2018-10-10: 300 mg via ORAL
  Filled 2018-10-10: qty 1

## 2018-10-10 MED ORDER — 0.9 % SODIUM CHLORIDE (POUR BTL) OPTIME
TOPICAL | Status: DC | PRN
  Administered 2018-10-10: 1000 mL

## 2018-10-10 MED ORDER — HYDROCODONE-ACETAMINOPHEN 5-325 MG PO TABS: 1.0000 | ORAL_TABLET | Freq: Four times a day (QID) | ORAL | 0 refills | Status: DC | PRN

## 2018-10-10 MED ORDER — PROPOFOL 10 MG/ML IV BOLUS
INTRAVENOUS | Status: AC
  Filled 2018-10-10: qty 20

## 2018-10-10 MED ORDER — EPHEDRINE 5 MG/ML INJ
INTRAVENOUS | Status: AC
  Filled 2018-10-10: qty 10

## 2018-10-10 MED ORDER — FENTANYL CITRATE (PF) 100 MCG/2ML IJ SOLN
INTRAMUSCULAR | Status: DC | PRN
  Administered 2018-10-10 (×2): 50 ug via INTRAVENOUS

## 2018-10-10 MED ORDER — ACETAMINOPHEN 500 MG PO TABS
1000.0000 mg | ORAL_TABLET | ORAL | Status: AC
  Administered 2018-10-10: 1000 mg via ORAL
  Filled 2018-10-10: qty 2

## 2018-10-10 MED ORDER — BUPIVACAINE-EPINEPHRINE 0.25% -1:200000 IJ SOLN
INTRAMUSCULAR | Status: DC | PRN
  Administered 2018-10-10: 10 mL

## 2018-10-10 MED ORDER — ONDANSETRON HCL 4 MG/2ML IJ SOLN: 4.0000 mg | Freq: Once | INTRAMUSCULAR | Status: DC | PRN

## 2018-10-10 MED ORDER — EPHEDRINE SULFATE-NACL 50-0.9 MG/10ML-% IV SOSY
PREFILLED_SYRINGE | INTRAVENOUS | Status: DC | PRN
  Administered 2018-10-10 (×3): 5 mg via INTRAVENOUS

## 2018-10-10 MED ORDER — CHLORHEXIDINE GLUCONATE CLOTH 2 % EX PADS: 6.0000 | MEDICATED_PAD | Freq: Once | CUTANEOUS | Status: DC

## 2018-10-10 MED ORDER — ONDANSETRON HCL 4 MG/2ML IJ SOLN
INTRAMUSCULAR | Status: AC
  Filled 2018-10-10: qty 2

## 2018-10-10 MED ORDER — OXYCODONE HCL 5 MG PO TABS: 5.0000 mg | ORAL_TABLET | Freq: Once | ORAL | Status: DC | PRN

## 2018-10-10 MED ORDER — FENTANYL CITRATE (PF) 100 MCG/2ML IJ SOLN: 25.0000 ug | INTRAMUSCULAR | Status: DC | PRN

## 2018-10-10 MED ORDER — PROPOFOL 10 MG/ML IV BOLUS
INTRAVENOUS | Status: DC | PRN
  Administered 2018-10-10: 20 mg via INTRAVENOUS
  Administered 2018-10-10: 100 mg via INTRAVENOUS
  Administered 2018-10-10: 30 mg via INTRAVENOUS

## 2018-10-10 MED ORDER — LACTATED RINGERS IV SOLN
INTRAVENOUS | Status: DC
  Administered 2018-10-10: 07:00:00 via INTRAVENOUS

## 2018-10-10 MED ORDER — DEXAMETHASONE SODIUM PHOSPHATE 10 MG/ML IJ SOLN
INTRAMUSCULAR | Status: AC
  Filled 2018-10-10: qty 1

## 2018-10-10 MED ORDER — ONDANSETRON HCL 4 MG/2ML IJ SOLN
INTRAMUSCULAR | Status: DC | PRN
  Administered 2018-10-10: 4 mg via INTRAVENOUS

## 2018-10-10 MED ORDER — FENTANYL CITRATE (PF) 250 MCG/5ML IJ SOLN
INTRAMUSCULAR | Status: AC
  Filled 2018-10-10: qty 5

## 2018-10-10 MED ORDER — LIDOCAINE HCL (CARDIAC) PF 100 MG/5ML IV SOSY
PREFILLED_SYRINGE | INTRAVENOUS | Status: DC | PRN
  Administered 2018-10-10: 60 mg via INTRAVENOUS

## 2018-10-10 MED ORDER — DEXAMETHASONE SODIUM PHOSPHATE 10 MG/ML IJ SOLN
INTRAMUSCULAR | Status: DC | PRN
  Administered 2018-10-10: 10 mg via INTRAVENOUS

## 2018-10-10 MED ORDER — BUPIVACAINE-EPINEPHRINE (PF) 0.25% -1:200000 IJ SOLN
INTRAMUSCULAR | Status: AC
  Filled 2018-10-10: qty 30

## 2018-10-10 MED ORDER — OXYCODONE HCL 5 MG/5ML PO SOLN: 5.0000 mg | Freq: Once | ORAL | Status: DC | PRN

## 2018-10-10 MED ORDER — CEFAZOLIN SODIUM-DEXTROSE 2-4 GM/100ML-% IV SOLN
2.0000 g | INTRAVENOUS | Status: AC
  Administered 2018-10-10: 2 g via INTRAVENOUS
  Filled 2018-10-10: qty 100

## 2018-10-10 MED ORDER — LIDOCAINE 2% (20 MG/ML) 5 ML SYRINGE
INTRAMUSCULAR | Status: AC
  Filled 2018-10-10: qty 5

## 2018-10-10 SURGICAL SUPPLY — 39 items
APPLIER CLIP 9.375 MED OPEN (MISCELLANEOUS)
BINDER BREAST LRG (GAUZE/BANDAGES/DRESSINGS) IMPLANT
BINDER BREAST XLRG (GAUZE/BANDAGES/DRESSINGS) IMPLANT
BLADE SURG 15 STRL LF DISP TIS (BLADE) ×1 IMPLANT
BLADE SURG 15 STRL SS (BLADE) ×1
CANISTER SUCT 3000ML PPV (MISCELLANEOUS) ×2 IMPLANT
CHLORAPREP W/TINT 26ML (MISCELLANEOUS) ×2 IMPLANT
CLIP APPLIE 9.375 MED OPEN (MISCELLANEOUS) IMPLANT
COVER PROBE W GEL 5X96 (DRAPES) ×2 IMPLANT
COVER SURGICAL LIGHT HANDLE (MISCELLANEOUS) ×2 IMPLANT
COVER WAND RF STERILE (DRAPES) ×2 IMPLANT
DERMABOND ADVANCED (GAUZE/BANDAGES/DRESSINGS) ×1
DERMABOND ADVANCED .7 DNX12 (GAUZE/BANDAGES/DRESSINGS) ×1 IMPLANT
DEVICE DUBIN SPECIMEN MAMMOGRA (MISCELLANEOUS) ×2 IMPLANT
DRAPE CHEST BREAST 15X10 FENES (DRAPES) ×2 IMPLANT
DRAPE UTILITY XL STRL (DRAPES) ×2 IMPLANT
ELECT COATED BLADE 2.86 ST (ELECTRODE) ×2 IMPLANT
ELECT REM PT RETURN 9FT ADLT (ELECTROSURGICAL) ×2
ELECTRODE REM PT RTRN 9FT ADLT (ELECTROSURGICAL) ×1 IMPLANT
GLOVE BIO SURGEON STRL SZ7.5 (GLOVE) ×4 IMPLANT
GOWN STRL REUS W/ TWL LRG LVL3 (GOWN DISPOSABLE) ×2 IMPLANT
GOWN STRL REUS W/TWL LRG LVL3 (GOWN DISPOSABLE) ×2
KIT BASIN OR (CUSTOM PROCEDURE TRAY) ×2 IMPLANT
KIT MARKER MARGIN INK (KITS) ×2 IMPLANT
LIGHT WAVEGUIDE WIDE FLAT (MISCELLANEOUS) IMPLANT
NEEDLE HYPO 25GX1X1/2 BEV (NEEDLE) ×2 IMPLANT
NS IRRIG 1000ML POUR BTL (IV SOLUTION) ×2 IMPLANT
PACK SURGICAL SETUP 50X90 (CUSTOM PROCEDURE TRAY) ×2 IMPLANT
PENCIL BUTTON HOLSTER BLD 10FT (ELECTRODE) ×2 IMPLANT
SPONGE LAP 18X18 X RAY DECT (DISPOSABLE) ×2 IMPLANT
SUT MNCRL AB 4-0 PS2 18 (SUTURE) ×2 IMPLANT
SUT SILK 2 0 SH (SUTURE) IMPLANT
SUT VIC AB 3-0 SH 18 (SUTURE) ×2 IMPLANT
SYR BULB 3OZ (MISCELLANEOUS) ×2 IMPLANT
SYR CONTROL 10ML LL (SYRINGE) ×2 IMPLANT
TOWEL OR 17X24 6PK STRL BLUE (TOWEL DISPOSABLE) ×2 IMPLANT
TOWEL OR 17X26 10 PK STRL BLUE (TOWEL DISPOSABLE) IMPLANT
TUBE CONNECTING 12X1/4 (SUCTIONS) ×2 IMPLANT
YANKAUER SUCT BULB TIP NO VENT (SUCTIONS) ×2 IMPLANT

## 2018-10-10 NOTE — Anesthesia Postprocedure Evaluation (Signed)
Anesthesia Post Note  Patient: Becky Farrell  Procedure(s) Performed: BREAST LUMPECTOMY WITH RADIOACTIVE SEED LOCALIZATION (Right Breast)     Patient location during evaluation: PACU Anesthesia Type: General Level of consciousness: awake and alert Pain management: pain level controlled Vital Signs Assessment: post-procedure vital signs reviewed and stable Respiratory status: spontaneous breathing, nonlabored ventilation and respiratory function stable Cardiovascular status: blood pressure returned to baseline and stable Postop Assessment: no apparent nausea or vomiting Anesthetic complications: no    Last Vitals:  Vitals:   10/10/18 0933 10/10/18 0935  BP:  (!) 153/85  Pulse: 66 64  Resp: 17 18  Temp: (!) 36.3 C   SpO2: 98%     Last Pain:  Vitals:   10/10/18 0935  TempSrc:   PainSc: 0-No pain                 Audry Pili

## 2018-10-10 NOTE — Interval H&P Note (Signed)
History and Physical Interval Note:  10/10/2018 7:52 AM  Becky Farrell  has presented today for surgery, with the diagnosis of RIGHT BREAST CANCER  The various methods of treatment have been discussed with the patient and family. After consideration of risks, benefits and other options for treatment, the patient has consented to  Procedure(s): BREAST LUMPECTOMY WITH RADIOACTIVE SEED LOCALIZATION (Right) as a surgical intervention .  The patient's history has been reviewed, patient examined, no change in status, stable for surgery.  I have reviewed the patient's chart and labs.  Questions were answered to the patient's satisfaction.     [name]

## 2018-10-10 NOTE — Op Note (Signed)
10/10/2018  8:57 AM  PATIENT:  Becky Farrell  84 y.o. female  PRE-OPERATIVE DIAGNOSIS:  RIGHT BREAST CANCER  POST-OPERATIVE DIAGNOSIS:  RIGHT BREAST CANCER  PROCEDURE:  Procedure(s): BREAST LUMPECTOMY WITH RADIOACTIVE SEED LOCALIZATION (Right)  SURGEON:  Surgeon(s) and Role:    * Jovita Kussmaul, MD - Primary  PHYSICIAN ASSISTANT:   ASSISTANTS: none   ANESTHESIA:   local and general  EBL:  minimal   BLOOD ADMINISTERED:none  DRAINS: none   LOCAL MEDICATIONS USED:  MARCAINE     SPECIMEN:  Source of Specimen:  right breast tissue  DISPOSITION OF SPECIMEN:  PATHOLOGY  COUNTS:  YES  TOURNIQUET:  * No tourniquets in log *  DICTATION: .Dragon Dictation   After informed consent was obtained the patient was brought to the operating room and placed in the supine position on the operating table.  After adequate induction of general anesthesia the patient's right chest, breast, and axillary area were prepped with ChloraPrep, allowed to dry, and draped in usual sterile manner.  An appropriate timeout was performed.  Previously an I-125 seed was placed in the upper outer quadrant of the right breast to mark an area of invasive breast cancer.  The neoprobe was set to I-125 in the area of radioactivity was readily identified.  The area around this was infiltrated with quarter percent Marcaine.  An elliptical incision was made with a 15 blade knife overlying the area of radioactivity.  The incision was carried through the skin and subcutaneous tissue sharply with the electrocautery.  A circular portion of breast tissue was then excised sharply around the radioactive seed while checking the area of radioactivity frequently.  This dissection was carried all the way to the chest wall.  Once the specimen was removed it was oriented with the appropriate paint colors.  Some large vessels in the breast were controlled with clips.  A specimen radiograph was obtained that showed the clip and seed to be  near the center of the specimen.  The specimen was then sent to pathology for further evaluation.  Hemostasis was achieved using the Bovie electrocautery.  The wound was irrigated with saline and infiltrated with quarter percent Marcaine.  The deep layer of the wound was then closed with layers of interrupted 3-0 Vicryl stitches.  The skin was then closed with a running 4-0 Monocryl subcuticular stitch.  Dermabond dressings were applied.  The patient tolerated the procedure well.  At the end of the case all needle sponge and instrument counts were correct.  The patient was then awakened and taken to recovery in stable condition.  The patient did not require any node evaluation given her age.  PLAN OF CARE: Discharge to home after PACU  PATIENT DISPOSITION:  PACU - hemodynamically stable.   Delay start of Pharmacological VTE agent (>24hrs) due to surgical blood loss or risk of bleeding: not applicable

## 2018-10-10 NOTE — H&P (Signed)
Becky Farrell  Location: [place] Office Patient #: [id number] DOB: 09/14/1934 Married / Language: English / Race: White Female   History of Present Illness The patient is a 84 year old female who presents with breast cancer. we are asked to see the patient in consultation by Dr. [name] to evaluate her for a new right breast cancer. the patient is an 84 year old white female who recently went for a routine screening mammogram. At that time she was found to have an 8 mm abnormality in the upper outer quadrant of the right breast. This was biopsied and came back as an invasive ductal breast cancer that was ER and PR positive and HER-2 negative. She denies any breast pain or discharge from the nipple. She is on Coumadin for recurrent pulmonary emboli. She also has a history of breast cancer in her daughter at the age of 62 as well as in a niece.   Past Surgical History  Breast Biopsy  Right. Cataract Surgery  Right. Oral Surgery   Diagnostic Studies History Mammogram  within last year  Allergies  Clobetasol Propionate *DERMATOLOGICALS*  Dextromethorphan *CHEMICALS*  Phenergan *ANTIHISTAMINES*  Zithromax *MACROLIDES*   Medication History  Atenolol ('25MG'$  Tablet, Oral) Active. hydroCHLOROthiazide (12.'5MG'$  Capsule, Oral) Active. Lisinopril ('10MG'$  Tablet, Oral) Active. Warfarin Sodium (7.'5MG'$  Tablet, Oral) Active. (on Sat, Sun, Tue, Wed and Thur) Warfarin Sodium ('5MG'$  Tablet, Oral) Active. (on Fridays) Warfarin Sodium ('3MG'$  Tablet, Oral) Active. (3 3/4 on Mondays) Aspirin ('81MG'$  Tablet, Oral) Active. Calcium Carbonate ('600MG'$  Tablet, Oral) Active. Vitamin D3 (2000UNIT Tablet, Oral) Active. Medications Reconciled  Family History ( Breast Cancer  Daughter.  Pregnancy / Birth History  Age at menarche  30 years. Contraceptive History  Depo-provera. Gravida  4 Length (months) of breastfeeding  3-6 Maternal age  14-25 Para  4    Review of Systems   General Not Present- Appetite Loss, Chills, Fatigue, Fever, Night Sweats, Weight Gain and Weight Loss. Skin Not Present- Change in Wart/Mole, Dryness, Hives, Jaundice, New Lesions, Non-Healing Wounds, Rash and Ulcer. HEENT Present- Hoarseness. Not Present- Earache, Hearing Loss, Nose Bleed, Oral Ulcers, Ringing in the Ears, Seasonal Allergies, Sinus Pain, Sore Throat, Visual Disturbances, Wears glasses/contact lenses and Yellow Eyes. Breast Present- Breast Mass. Not Present- Breast Pain, Nipple Discharge and Skin Changes. Gastrointestinal Not Present- Abdominal Pain, Bloating, Bloody Stool, Change in Bowel Habits, Chronic diarrhea, Constipation, Difficulty Swallowing, Excessive gas, Gets full quickly at meals, Hemorrhoids, Indigestion, Nausea, Rectal Pain and Vomiting. Female Genitourinary Not Present- Frequency, Nocturia, Painful Urination, Pelvic Pain and Urgency. Musculoskeletal Not Present- Back Pain, Joint Pain, Joint Stiffness, Muscle Pain, Muscle Weakness and Swelling of Extremities. Neurological Not Present- Decreased Memory, Fainting, Headaches, Numbness, Seizures, Tingling, Tremor, Trouble walking and Weakness. Endocrine Not Present- Cold Intolerance, Excessive Hunger, Hair Changes, Heat Intolerance, Hot flashes and New Diabetes.  Vitals  Weight: 139.38 lb Height: 63in Body Surface Area: 1.66 m Body Mass Index: 24.69 kg/m  Pulse: 83 (Regular)        Physical Exam General Mental Status-Alert. General Appearance-Consistent with stated age. Hydration-Well hydrated. Voice-Normal.  Head and Neck Head-normocephalic, atraumatic with no lesions or palpable masses. Trachea-midline. Thyroid Gland Characteristics - normal size and consistency.  Eye Eyeball - Bilateral-Extraocular movements intact. Sclera/Conjunctiva - Bilateral-No scleral icterus.  Chest and Lung Exam Chest and lung exam reveals -quiet, even and easy respiratory effort with no use  of accessory muscles and on auscultation, normal breath sounds, no adventitious sounds and normal vocal resonance. Inspection Chest Wall - Normal. Back -  normal.  Breast Note: there is no palpable mass in either breast. There is no palpable axillary, supraclavicular, or cervical lymphadenopathy.   Cardiovascular Cardiovascular examination reveals -normal heart sounds, regular rate and rhythm with no murmurs and normal pedal pulses bilaterally.  Abdomen Inspection Inspection of the abdomen reveals - No Hernias. Skin - Scar - no surgical scars. Palpation/Percussion Palpation and Percussion of the abdomen reveal - Soft, Non Tender, No Rebound tenderness, No Rigidity (guarding) and No hepatosplenomegaly. Auscultation Auscultation of the abdomen reveals - Bowel sounds normal.  Neurologic Neurologic evaluation reveals -alert and oriented x 3 with no impairment of recent or remote memory. Mental Status-Normal.  Musculoskeletal Normal Exam - Left-Upper Extremity Strength Normal and Lower Extremity Strength Normal. Normal Exam - Right-Upper Extremity Strength Normal and Lower Extremity Strength Normal.  Lymphatic Head & Neck  General Head & Neck Lymphatics: Bilateral - Description - Normal. Axillary  General Axillary Region: Bilateral - Description - Normal. Tenderness - Non Tender. Femoral & Inguinal  Generalized Femoral & Inguinal Lymphatics: Bilateral - Description - Normal. Tenderness - Non Tender.    Assessment & Plan  MALIGNANT NEOPLASM OF UPPER-OUTER QUADRANT OF RIGHT BREAST IN FEMALE, ESTROGEN RECEPTOR POSITIVE (C50.411) Impression: the patient appears to have a small stage I cancer in the upper outer right breast with favorable markers. I have talked her in detail about the options for treatment and at this point she favors breast conservation. Given her age I think it would be reasonable to forego a node evaluation. I have discussed with her in detail the risks  and benefits of the operation as well as some of the technical aspects and she understands and wishes to proceed. I will plan for a right breast radioactive seed localized lumpectomy. I will contact her medical doctor about how to manage her Coumadin. Specifically we need to know if she needs a Lovenox bridge and can she stop Coumadin 5 days before surgery. I will also contact her medical oncologist to make sure that they are approving of foregoing the node evaluation. Current Plans Referred to Genetic Counseling, for evaluation and follow up (Medical Genetics). Routine.

## 2018-10-10 NOTE — Transfer of Care (Signed)
Immediate Anesthesia Transfer of Care Note  Patient: Becky Farrell  Procedure(s) Performed: BREAST LUMPECTOMY WITH RADIOACTIVE SEED LOCALIZATION (Right Breast)  Patient Location: PACU  Anesthesia Type:General  Level of Consciousness: oriented, drowsy and patient cooperative  Airway & Oxygen Therapy: Patient Spontanous Breathing and Patient connected to nasal cannula oxygen  Post-op Assessment: Report given to RN and Post -op Vital signs reviewed and stable  Post vital signs: Reviewed  Last Vitals:  Vitals Value Taken Time  BP    Temp    Pulse    Resp    SpO2      Last Pain:  Vitals:   10/10/18 0651  TempSrc:   PainSc: 0-No pain         Complications: No apparent anesthesia complications

## 2018-10-10 NOTE — Anesthesia Procedure Notes (Signed)
Procedure Name: LMA Insertion Date/Time: 10/10/2018 8:09 AM Performed by: Jenne Campus, CRNA Pre-anesthesia Checklist: Patient identified, Emergency Drugs available, Suction available and Patient being monitored Patient Re-evaluated:Patient Re-evaluated prior to induction Oxygen Delivery Method: Circle System Utilized Preoxygenation: Pre-oxygenation with 100% oxygen Induction Type: IV induction Ventilation: Mask ventilation without difficulty LMA: LMA inserted LMA Size: 4.0 Number of attempts: 1 Airway Equipment and Method: Bite block Placement Confirmation: positive ETCO2 and breath sounds checked- equal and bilateral Tube secured with: Tape Dental Injury: Teeth and Oropharynx as per pre-operative assessment

## 2018-10-11 ENCOUNTER — Encounter (HOSPITAL_COMMUNITY): Payer: Self-pay | Admitting: General Surgery

## 2018-10-13 ENCOUNTER — Other Ambulatory Visit: Payer: Self-pay | Admitting: Internal Medicine

## 2018-10-13 ENCOUNTER — Telehealth: Payer: Self-pay

## 2018-10-13 NOTE — Telephone Encounter (Signed)
[facility] faxed note on 10/10/18 pt called with question about when to take Lovenox injection; TH RN advised to follow instructions from [facility]; per [facility] note pt voiced understanding and will follow instructions from [facility] instead of post op instructions. FYI to Leticia Penna RN. Copy of [employer] note sent for scanning and another copy placed on Mandy's desk.

## 2018-10-14 NOTE — Telephone Encounter (Signed)
Spoke with patient today, she is doing well and took her last lovenox today as per my instruction.   She will see me tomorrow in clinic for her next INR

## 2018-10-15 ENCOUNTER — Ambulatory Visit: Payer: Medicare Other

## 2018-10-15 DIAGNOSIS — Z7901 Long term (current) use of anticoagulants: Secondary | ICD-10-CM

## 2018-10-15 LAB — POCT INR: INR: 2.5 (ref 2.0–3.0)

## 2018-10-15 NOTE — Patient Instructions (Signed)
INR results:2.5  STOP LOVENOX Resume taking 3.75mg  on Mon, 5mg  on Fridays and 7.5mg  the rest of the days.  Recheck in 1 week.  Results given to patient with written instructions and informed [name], RN at [facility] of reading and need for lab recheck on 10/23/18.

## 2018-10-23 ENCOUNTER — Ambulatory Visit: Payer: Self-pay

## 2018-10-23 DIAGNOSIS — Z7901 Long term (current) use of anticoagulants: Secondary | ICD-10-CM

## 2018-10-23 LAB — POCT INR: INR: 2.1 (ref 2.0–3.0)

## 2018-10-29 NOTE — Patient Instructions (Signed)
INR results:2.1   Continue taking 3.75mg  on Mon, 5mg  on Fridays and 7.5mg  the rest of the days.  Recheck in 2 weeks.  Results/instructions given to [name], RN at [facility] and need for lab recheck on 11/06/18. Caryl Pina will notify patient.

## 2018-11-06 ENCOUNTER — Ambulatory Visit: Payer: Self-pay

## 2018-11-06 DIAGNOSIS — Z7901 Long term (current) use of anticoagulants: Secondary | ICD-10-CM

## 2018-11-06 LAB — POCT INR: INR: 2.5 (ref 2.0–3.0)

## 2018-11-07 ENCOUNTER — Encounter: Payer: Self-pay | Admitting: Hematology and Oncology

## 2018-11-07 ENCOUNTER — Inpatient Hospital Stay: Payer: Medicare Other | Attending: Hematology and Oncology | Admitting: Hematology and Oncology

## 2018-11-07 VITALS — BP 148/71 | HR 71 | Temp 97.3°F | Resp 18 | Ht 63.0 in | Wt 144.0 lb

## 2018-11-07 DIAGNOSIS — I10 Essential (primary) hypertension: Secondary | ICD-10-CM | POA: Diagnosis not present

## 2018-11-07 DIAGNOSIS — Z170 Estrogen receptor positive status [ER+]: Secondary | ICD-10-CM

## 2018-11-07 DIAGNOSIS — M818 Other osteoporosis without current pathological fracture: Secondary | ICD-10-CM | POA: Diagnosis not present

## 2018-11-07 DIAGNOSIS — Z86711 Personal history of pulmonary embolism: Secondary | ICD-10-CM | POA: Diagnosis not present

## 2018-11-07 DIAGNOSIS — M810 Age-related osteoporosis without current pathological fracture: Secondary | ICD-10-CM

## 2018-11-07 DIAGNOSIS — Z79899 Other long term (current) drug therapy: Secondary | ICD-10-CM | POA: Insufficient documentation

## 2018-11-07 DIAGNOSIS — C50411 Malignant neoplasm of upper-outer quadrant of right female breast: Secondary | ICD-10-CM

## 2018-11-07 DIAGNOSIS — C50211 Malignant neoplasm of upper-inner quadrant of right female breast: Secondary | ICD-10-CM

## 2018-11-07 DIAGNOSIS — Z803 Family history of malignant neoplasm of breast: Secondary | ICD-10-CM

## 2018-11-07 DIAGNOSIS — Z7901 Long term (current) use of anticoagulants: Secondary | ICD-10-CM | POA: Insufficient documentation

## 2018-11-07 LAB — POCT INR: INR: 2.5 (ref 2.0–3.0)

## 2018-11-07 NOTE — Patient Instructions (Signed)
INR results:2.5  Continue taking 3.75mg  on Mon, 5mg  on Fridays and 7.5mg  the rest of the days.  Recheck in 4 weeks  Results/instructions given to [name], RN at [facility] and need for lab recheck on 12/04/18. Caryl Pina will notify patient.

## 2018-11-07 NOTE — Progress Notes (Signed)
No new changes noted today 

## 2018-11-07 NOTE — Progress Notes (Signed)
[facility] day:  11/07/2018  Chief Complaint: Becky Farrell is a 84 y.o. female with stage I right breast cancer who is seen for assessment after interval lumpectomy.  HPI:  The patient was last seen in the medical oncology clinic on 09/08/2018 for initial consultation.  She had clinical stage I right breast cancer s/p biopsy.  She was scheduled to see Dr. Marlou Starks.  We discussed adjuvant endocrine therapy.  We discussed management of her osteoporosis.  She underwent right lumpectomy on 10/10/2018. Pathology revealed a 0.4 cm grade I invasive ductal carcinoma with mucinous features.  There was intermediate grade DCIS.  Margins were uninvolved (1.0 cm posterior margin).  There were fibrocystic changes including apocrine metaplasia.  Tumor was ER + (100%), PR + (90%), and Her2/neu-.  Ki67 was 10%.  Pathologic stage was T1aNx.  During the interim, patient is recovering well from her surgery. Patient was bridged from oral anticoagulation (warfarin) to enoxaparin injections.    Past Medical History:  Diagnosis Date  . Colon polyps   . History of pulmonary embolism 2011  . Hypertension   . Melanoma ([place]) 2003  . Seizures ([place]) 1993   no meds since (approx) 2014  . Vertigo    x1. R/T perforated ear drum prior to 2013    Past Surgical History:  Procedure Laterality Date  . ABDOMINAL HYSTERECTOMY  05/2014  . BREAST LUMPECTOMY WITH RADIOACTIVE SEED LOCALIZATION Right 10/10/2018   Procedure: BREAST LUMPECTOMY WITH RADIOACTIVE SEED LOCALIZATION;  Surgeon: Jovita Kussmaul, MD;  Location: [place];  Service: General;  Laterality: Right;  . CATARACT EXTRACTION W/PHACO Right 01/06/2018   Procedure: CATARACT EXTRACTION PHACO AND INTRAOCULAR LENS PLACEMENT ([place]) RIGHT;  Surgeon: Leandrew Koyanagi, MD;  Location: [place];  Service: Ophthalmology;  Laterality: Right;  . CATARACT EXTRACTION W/PHACO Left 01/29/2018   Procedure: CATARACT EXTRACTION PHACO AND  INTRAOCULAR LENS PLACEMENT ([place]) LEFT;  Surgeon: Leandrew Koyanagi, MD;  Location: [place];  Service: Ophthalmology;  Laterality: Left;  . left hand surgery  05/2011   s/p fall  . OTHER SURGICAL HISTORY  2003   Removal of lymphoma from melonoma 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     Family History  Problem Relation Age of Onset  . Heart disease Mother   . Aortic aneurysm Father   . Breast cancer Daughter 75    Social History:  reports that she has never smoked. She has never used smokeless tobacco. She reports that she does not drink alcohol or use drugs.  The patient recently moved from [place] to [place].  Her daughter and grandchildren live in [place].  She has other children in [place].  Her husband has been recovering from prostate cancer.  She is a retired Pharmacist, hospital.  The patient is accompanied by her husband today.  Allergies:  Allergies  Allergen Reactions  . Clobetasol Other (See Comments)    vertigo  . Clobetasol Propionate Other (See Comments)    This is ointment form UNSPECIFIED REACTION   . Phenergan [Promethazine Hcl] Other (See Comments)    Rapid heart rate  . Dextromethorphan Palpitations and Other (See Comments)    promethazine with DM, rapid hearbeat  . Zithromax [Azithromycin] Palpitations and Other (See Comments)    Per OSH report, pt unable to recall Rapid heart rate    Current Medications: Current Outpatient Medications  Medication Sig Dispense Refill  . aspirin 81 MG tablet Take 81 mg by mouth daily.    Marland Kitchen  atenolol (TENORMIN) 25 MG tablet TAKE 1 TABLET BY MOUTH EVERY DAY (Patient taking differently: Take 25 mg by mouth daily. ) 90 tablet 3  . Cholecalciferol (VITAMIN D3) 2000 UNITS TABS Take 2,000 Units by mouth daily.     . hydrochlorothiazide (MICROZIDE) 12.5 MG capsule TAKE 1 CAPSULE BY MOUTH EVERY DAY 90 capsule 0  . lisinopril (PRINIVIL,ZESTRIL) 10 MG tablet Take 1 tablet (10 mg total) by mouth daily. 90 tablet 0  . polyethylene glycol  (MIRALAX / GLYCOLAX) packet Take 17 g by mouth every other day.    . warfarin (COUMADIN) 5 MG tablet TAKE AS DIRECTED (Patient taking differently: Take 3.75-7.5 mg by mouth See admin instructions. Take 5 mg by mouth daily on Friday. Take 3.75 mg by mouth daily on Monday. Take 7.5 mg by mouth daily on all other days.) 30 tablet 2  . Wheat Dextrin (BENEFIBER PO) Take 2 each by mouth See admin instructions. Mix 2 teaspoons and drink 3 times daily    . enoxaparin (LOVENOX) 100 MG/ML injection Inject 1 mL (100 mg total) into the skin daily. (Patient not taking: Reported on 11/07/2018) 7 Syringe 0  . HYDROcodone-acetaminophen (NORCO/VICODIN) 5-325 MG tablet Take 1-2 tablets by mouth every 6 (six) hours as needed for moderate pain or severe pain. (Patient not taking: Reported on 11/07/2018) 15 tablet 0  . Iodoquinol-HC-Aloe Polysacch (ALCORTIN A) 1-2-1 % GEL Apply topically as needed (for rash).     No current facility-administered medications for this visit.     Review of Systems  Constitutional: Negative.  Negative for chills, diaphoresis, fever, malaise/fatigue and weight loss (up 6 pounds).       Feels "good".  HENT: Positive for hearing loss. Negative for congestion, ear discharge, ear pain, nosebleeds, sinus pain and sore throat.   Eyes: Negative.  Negative for blurred vision and double vision.  Respiratory: Negative for cough, hemoptysis, sputum production and shortness of breath.        Recurrent PE x 2.  Cardiovascular: Negative.  Negative for chest pain, palpitations, orthopnea, leg swelling and PND.  Gastrointestinal: Positive for heartburn (at night) and nausea (at night). Negative for abdominal pain, blood in stool, constipation, diarrhea, melena and vomiting.  Genitourinary: Negative.  Negative for dysuria, frequency, hematuria and urgency.  Musculoskeletal: Negative for back pain, falls, joint pain, myalgias and neck pain.  Skin: Negative for itching and rash.       Bruising s/p Lovenox  injections.  Neurological: Negative for dizziness, tremors, speech change, focal weakness, seizures (history), weakness and headaches.       Chronic balance issues.  Endo/Heme/Allergies: Bruises/bleeds easily (on anticoagulation).  Psychiatric/Behavioral: Negative.  Negative for depression, memory loss and suicidal ideas. The patient is not nervous/anxious and does not have insomnia.   All other systems reviewed and are negative.  Performance status (ECOG): 1  Vital Signs BP (!) 148/71 (BP Location: Left Arm, Patient Position: Sitting)   Pulse 71   Temp (!) 97.3 F (36.3 C) (Tympanic)   Resp 18   Ht _0  (1.6 m)   Wt 144 lb (65.3 kg)   SpO2 98%   BMI 25.51 kg/m   Physical Exam  Constitutional: She is oriented to person, place, and time. No distress.  Well developed, well nourished elderly woman sitting comfortably in the exam room.    HENT:  Head: Normocephalic and atraumatic.  Mouth/Throat: Oropharynx is clear and moist. No oropharyngeal exudate.  Short white hair.  Eyes: Pupils are equal, round, and reactive to  light. Conjunctivae and EOM are normal. No scleral icterus.  Glasses. Blue eyes s/p cataract surgery.   Neck: Normal range of motion. Neck supple. No JVD present.  Cardiovascular: Normal rate, regular rhythm and normal heart sounds. Exam reveals no gallop and no friction rub.  No murmur heard. Pulmonary/Chest: Effort normal and breath sounds normal. No respiratory distress. She has no wheezes. She has no rales. Right breast exhibits no inverted nipple, no mass and no nipple discharge. Left breast exhibits no inverted nipple, no mass, no nipple discharge, no skin change and no tenderness. Breasts are asymmetrical (right breast with post-operative changes and lateral incision, well healing).  Abdominal: Soft. Bowel sounds are normal. She exhibits no distension and no mass. There is no abdominal tenderness. There is no rebound and no guarding.  Musculoskeletal: Normal range  of motion.        General: No tenderness or edema.  Lymphadenopathy:    She has no cervical adenopathy.    She has no axillary adenopathy.       Right: No inguinal and no supraclavicular adenopathy present.       Left: No inguinal and no supraclavicular adenopathy present.  Neurological: She is alert and oriented to person, place, and time. Gait normal.  Skin: Skin is warm and dry. No rash noted. She is not diaphoretic. No erythema. No pallor.  Bruises s/p Lovenox injections.  Psychiatric: Mood, affect and judgment normal.  Nursing note and vitals reviewed.   Anti-coag visit on 11/06/2018  Component Date Value Ref Range Status  . INR 11/07/2018 2.5  2.0 - 3.0 Final  . INR 11/06/2018 2.5  2.0 - 3.0 Final    Assessment:  Becky Farrell is a 84 y.o. female with stage T1aNx right breast cancer s/p lumpectomy on 10/10/2018. Pathology revealed a 0.4 cm grade I invasive ductal carcinoma with mucinous features.  There was intermediate grade DCIS.  Margins were uninvolved (1.0 cm posterior margin).  There were fibrocystic changes including apocrine metaplasia.  Tumor was ER + (100%), PR + (90%), and Her2/neu-.  Ki67 was 10%.  Pathologic stage was T1aNx.  CA27.29 was 14.1 on 09/08/2018.  Bilateral screening mammogram on 07/31/2018 revealed a possible mass in the right breast.  Right diagnostic mammogram and targeted ultrasound on 08/13/2018 revealed a 4 x 5 x 8 mm indeterminate mass at the 10 o'clock position 9 cm from the nipple in the right breast.  Bone density on 07/31/2018 revealed osteoporosis with a T-score of -3.4 in the right forearm radius and -1.9 in the right femoral neck.  She has a history of pulmonary embolism x 2.  Initial event occurred in 2011.  She had a recurrent pulmonary embolism after holding Coumadin for 1 month for a planned colonoscopy.  She is on life long anticoagulation (Coumadin).  She has a family history of breast cancer (daughter, age 70, and  niece).  Symptomatically, she is doing well.  Exam reveals post-operative changes.  Plan: 1.  Stage T1aNx right breast cancer: Discuss interval surgery and pathology. Pathology reveals a T1Nx lesion. No lymph nodes sampled. Discuss plan for radiation. Discuss endocrine therapy post radiation.  Re-review tamoxifen versus aromatase inhibitors.  Potential side effects reviewed in detail. Discuss plan for follow-up in clinic after completion of radiation. 2.   Osteoporosis: Discuss bone density from 07/2018. Discuss calcium 1200 mg and vitamin D 800 IU a day. Discuss consideration of Prolia.  Potential side effects reviewed. Discuss need for dental clearance prior to initiation of Prolia.  Patient deferred consideration until a later date. Discuss consideration of initial tamoxifen (rather than an aromatase inhibitor) secondary to osteoporosis. 3.   Family history of breast cancer:  Breast cancer in daughter, age 32, and a niece.  [facility] genetic testing.    Referral to genetics. 4.   RTC after radiation for MD assessment, labs (CBC with diff, CMP), and initiation of endocrine therapy.   Honor Loh, NP  11/07/2018, 4:09 PM   I saw and evaluated the patient, participating in the key portions of the service and reviewing pertinent diagnostic studies and records.  I reviewed the nurse practitioner's note and agree with the findings and the plan.  The assessment and plan were discussed with the patient.  Multiple questions were asked by the patient and answered.   Nolon Stalls, MD 11/07/2018,4:09 PM

## 2018-11-19 ENCOUNTER — Encounter: Payer: Self-pay | Admitting: Genetics

## 2018-11-19 DIAGNOSIS — Z1379 Encounter for other screening for genetic and chromosomal anomalies: Secondary | ICD-10-CM | POA: Insufficient documentation

## 2018-11-20 ENCOUNTER — Other Ambulatory Visit: Payer: Self-pay | Admitting: Family Medicine

## 2018-11-24 ENCOUNTER — Ambulatory Visit
Admission: RE | Admit: 2018-11-24 | Discharge: 2018-11-24 | Disposition: A | Discharge status: Home / Self Care | Payer: Medicare Other | Source: Ambulatory Visit | Attending: Radiation Oncology | Admitting: Radiation Oncology

## 2018-11-24 ENCOUNTER — Other Ambulatory Visit: Payer: Self-pay

## 2018-11-24 ENCOUNTER — Encounter: Payer: Self-pay | Admitting: Radiation Oncology

## 2018-11-24 VITALS — BP 161/88 | HR 84 | Temp 97.2°F | Resp 18 | Wt 139.9 lb

## 2018-11-24 DIAGNOSIS — Z86010 Personal history of colonic polyps: Secondary | ICD-10-CM | POA: Insufficient documentation

## 2018-11-24 DIAGNOSIS — Z85828 Personal history of other malignant neoplasm of skin: Secondary | ICD-10-CM | POA: Diagnosis not present

## 2018-11-24 DIAGNOSIS — I10 Essential (primary) hypertension: Secondary | ICD-10-CM | POA: Diagnosis not present

## 2018-11-24 DIAGNOSIS — Z170 Estrogen receptor positive status [ER+]: Secondary | ICD-10-CM | POA: Diagnosis not present

## 2018-11-24 DIAGNOSIS — R42 Dizziness and giddiness: Secondary | ICD-10-CM | POA: Diagnosis not present

## 2018-11-24 DIAGNOSIS — Z7982 Long term (current) use of aspirin: Secondary | ICD-10-CM | POA: Diagnosis not present

## 2018-11-24 DIAGNOSIS — C50211 Malignant neoplasm of upper-inner quadrant of right female breast: Secondary | ICD-10-CM | POA: Insufficient documentation

## 2018-11-24 DIAGNOSIS — Z86711 Personal history of pulmonary embolism: Secondary | ICD-10-CM | POA: Diagnosis not present

## 2018-11-24 DIAGNOSIS — Z79899 Other long term (current) drug therapy: Secondary | ICD-10-CM | POA: Insufficient documentation

## 2018-11-24 DIAGNOSIS — G40909 Epilepsy, unspecified, not intractable, without status epilepticus: Secondary | ICD-10-CM | POA: Insufficient documentation

## 2018-11-24 NOTE — Consult Note (Signed)
NEW PATIENT EVALUATION  Name: Becky Farrell  MRN: [id number]  Date:   11/24/2018     DOB: 03-20-1934   This 84 y.o. female patient presents to the clinic for initial evaluation of stage I (T1 1 N0 M0) invasive mammary carcinoma of the right breast status post wide local excision.  REFERRING PHYSICIAN: Lequita Asal, MD  CHIEF COMPLAINT:  Chief Complaint  Patient presents with  . Breast Cancer    Initial consultation of breast cancer    DIAGNOSIS: The encounter diagnosis was Carcinoma of upper-inner quadrant of right breast in female, estrogen receptor positive ([place]).   PREVIOUS INVESTIGATIONS:  Mammogram and ultrasound reviewed Pathology reports reviewed Clinical notes reviewed  HPI: patient is a 84 year old female who presented with an abnormal mammogram of her right breast showing indeterminate mass at the 10:00 position the right breast 9 cm from nipple measuring 4 x 5 x 8 mm. She underwent ultrasound-guided guided biopsy which was positive for invasive mammary carcinoma. Ultrasound of her axilla was normal. Patient underwent a wide local excision showing 0.4 cm invasive carcinoma overall grade 1. Ductal carcinoma in situ was present. Margins were clear at 1 cm. No regional lymph nodes her sentinel lymph nodes were submitted for examination. Tumor was strongly ER/PR positive HER-2/neu not overexpressed. She's been seen by medical oncology and will have Asked to therapy after completion of treatment. She is doing well to present time she specifically denies breast tenderness cough or bone pain.  PLANNED TREATMENT REGIMEN: right whole breast radiation  PAST MEDICAL HISTORY:  has a past medical history of Colon polyps, History of pulmonary embolism (2011), Hypertension, Melanoma ([place]) (2003), Seizures ([place]) (1993), and Vertigo.    PAST SURGICAL HISTORY:  Past Surgical History:  Procedure Laterality Date  . ABDOMINAL HYSTERECTOMY  05/2014  . BREAST LUMPECTOMY WITH  RADIOACTIVE SEED LOCALIZATION Right 10/10/2018   Procedure: BREAST LUMPECTOMY WITH RADIOACTIVE SEED LOCALIZATION;  Surgeon: Jovita Kussmaul, MD;  Location: [place];  Service: General;  Laterality: Right;  . CATARACT EXTRACTION W/PHACO Right 01/06/2018   Procedure: CATARACT EXTRACTION PHACO AND INTRAOCULAR LENS PLACEMENT ([place]) RIGHT;  Surgeon: Leandrew Koyanagi, MD;  Location: [place];  Service: Ophthalmology;  Laterality: Right;  . CATARACT EXTRACTION W/PHACO Left 01/29/2018   Procedure: CATARACT EXTRACTION PHACO AND INTRAOCULAR LENS PLACEMENT ([place]) LEFT;  Surgeon: Leandrew Koyanagi, MD;  Location: [place];  Service: Ophthalmology;  Laterality: Left;  . left hand surgery  05/2011   s/p fall  . OTHER SURGICAL HISTORY  2003   Removal of lymphoma from melonoma 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     FAMILY HISTORY: family history includes Aortic aneurysm in her father; Breast cancer (age of onset: 76) in her daughter; Heart disease in her mother.  SOCIAL HISTORY:  reports that she has never smoked. She has never used smokeless tobacco. She reports that she does not drink alcohol or use drugs.  ALLERGIES: Clobetasol; Clobetasol propionate; Phenergan [promethazine hcl]; Dextromethorphan; and Zithromax [azithromycin]  MEDICATIONS:  Current Outpatient Medications  Medication Sig Dispense Refill  . aspirin 81 MG tablet Take 81 mg by mouth daily.    Marland Kitchen atenolol (TENORMIN) 25 MG tablet TAKE 1 TABLET BY MOUTH EVERY DAY (Patient taking differently: Take 25 mg by mouth daily. ) 90 tablet 3  . Cholecalciferol (VITAMIN D3) 2000 UNITS TABS Take 2,000 Units by mouth daily.     . hydrochlorothiazide (MICROZIDE) 12.5 MG capsule TAKE 1 CAPSULE BY MOUTH EVERY DAY 90 capsule 0  . Iodoquinol-HC-Aloe  Polysacch (ALCORTIN A) 1-2-1 % GEL Apply topically as needed (for rash).    Marland Kitchen lisinopril (PRINIVIL,ZESTRIL) 10 MG tablet Take 1 tablet (10 mg total) by mouth daily. 90 tablet 0  . polyethylene glycol (MIRALAX /  GLYCOLAX) packet Take 17 g by mouth every other day.    . warfarin (COUMADIN) 5 MG tablet Take 1-1.5 tablets (5-7.5 mg total) by mouth See admin instructions. Take 5 mg by mouth daily on Friday. Take 3.75 mg by mouth daily on Monday. Take 7.5 mg by mouth daily on all other days. 30 tablet 2  . Wheat Dextrin (BENEFIBER PO) Take 2 each by mouth See admin instructions. Mix 2 teaspoons and drink 3 times daily    . enoxaparin (LOVENOX) 100 MG/ML injection Inject 1 mL (100 mg total) into the skin daily. (Patient not taking: Reported on 11/07/2018) 7 Syringe 0  . HYDROcodone-acetaminophen (NORCO/VICODIN) 5-325 MG tablet Take 1-2 tablets by mouth every 6 (six) hours as needed for moderate pain or severe pain. (Patient not taking: Reported on 11/07/2018) 15 tablet 0   No current facility-administered medications for this encounter.     ECOG PERFORMANCE STATUS:  0 - Asymptomatic  REVIEW OF SYSTEMS:  Patient denies any weight loss, fatigue, weakness, fever, chills or night sweats. Patient denies any loss of vision, blurred vision. Patient denies any ringing  of the ears or hearing loss. No irregular heartbeat. Patient denies heart murmur or history of fainting. Patient denies any chest pain or pain radiating to her upper extremities. Patient denies any shortness of breath, difficulty breathing at night, cough or hemoptysis. Patient denies any swelling in the lower legs. Patient denies any nausea vomiting, vomiting of blood, or coffee ground material in the vomitus. Patient denies any stomach pain. Patient states has had normal bowel movements no significant constipation or diarrhea. Patient denies any dysuria, hematuria or significant nocturia. Patient denies any problems walking, swelling in the joints or loss of balance. Patient denies any skin changes, loss of hair or loss of weight. Patient denies any excessive worrying or anxiety or significant depression. Patient denies any problems with insomnia. Patient  denies excessive thirst, polyuria, polydipsia. Patient denies any swollen glands, patient denies easy bruising or easy bleeding. Patient denies any recent infections, allergies or URI. Patient "s visual fields have not changed significantly in recent time.    PHYSICAL EXAM: BP (!) 161/88 (BP Location: Left Arm, Patient Position: Sitting)   Pulse 84   Temp (!) 97.2 F (36.2 C) (Tympanic)   Resp 18   Wt 139 lb 14.1 oz (63.5 kg)   BMI 24.78 kg/m  Right breast is wide local excision scar which is healed well no dominant mass or nodularity is noted in either breast in 2 positions examined. No axillary or supraclavicular adenopathy is appreciated.Well-developed well-nourished patient in NAD. HEENT reveals PERLA, EOMI, discs not visualized.  Oral cavity is clear. No oral mucosal lesions are identified. Neck is clear without evidence of cervical or supraclavicular adenopathy. Lungs are clear to A&P. Cardiac examination is essentially unremarkable with regular rate and rhythm without murmur rub or thrill. Abdomen is benign with no organomegaly or masses noted. Motor sensory and DTR levels are equal and symmetric in the upper and lower extremities. Cranial nerves II through XII are grossly intact. Proprioception is intact. No peripheral adenopathy or edema is identified. No motor or sensory levels are noted. Crude visual fields are within normal range.  LABORATORY DATA: pathology reports reviewed    RADIOLOGY RESULTS:ultrasound and  mammography reviewed   IMPRESSION: stage I well-differentiated invasive mammary carcinoma the right breast status post wide local excision in 84 year old female  PLAN: at this time I to go ahead with whole breast radiation. Her breasts are rather large and pendulous making hypofractionated course of treatment difficult. I would plan on delivering 5040 cGy in 28 fractions to her right breast. I would boost her scar another 1400 cGy using electron beam. Risks and benefits of  treatment include a skin reaction fatigue alteration of blood counts possible inclusion of superficial lung all were discussed in detail with the patient and her husband. I have personally set up and ordered CT simulation for early next week. Patient also will be candidate for antiestrogen therapy after completion of radiation. Patient and husband both comprehend my treatment plan well.  I would like to take this opportunity to thank you for allowing me to participate in the care of your patient.Noreene Filbert, MD

## 2018-12-01 ENCOUNTER — Ambulatory Visit
Admission: RE | Admit: 2018-12-01 | Discharge: 2018-12-01 | Disposition: A | Discharge status: Home / Self Care | Payer: Medicare Other | Source: Ambulatory Visit | Attending: Radiation Oncology | Admitting: Radiation Oncology

## 2018-12-01 DIAGNOSIS — C50211 Malignant neoplasm of upper-inner quadrant of right female breast: Secondary | ICD-10-CM | POA: Diagnosis not present

## 2018-12-01 DIAGNOSIS — Z170 Estrogen receptor positive status [ER+]: Secondary | ICD-10-CM | POA: Diagnosis not present

## 2018-12-02 DIAGNOSIS — Z170 Estrogen receptor positive status [ER+]: Secondary | ICD-10-CM | POA: Diagnosis not present

## 2018-12-02 DIAGNOSIS — C50211 Malignant neoplasm of upper-inner quadrant of right female breast: Secondary | ICD-10-CM | POA: Diagnosis not present

## 2018-12-04 ENCOUNTER — Ambulatory Visit: Payer: Self-pay

## 2018-12-04 DIAGNOSIS — Z7901 Long term (current) use of anticoagulants: Secondary | ICD-10-CM | POA: Diagnosis not present

## 2018-12-04 LAB — POCT INR: INR: 3.1 — AB (ref 2.0–3.0)

## 2018-12-05 NOTE — Patient Instructions (Signed)
INR results:3.1  Hold dose today (12/04/18) and then continue taking 3.75mg  on Mon, 5mg  on Fridays and 7.5mg  the rest of the days.  Recheck in 2 weeks  Results/instructions given to [name], RN at [facility] and need for lab recheck on 12/18/18. Caryl Pina will notify patient.

## 2018-12-12 ENCOUNTER — Other Ambulatory Visit: Payer: Self-pay | Admitting: *Deleted

## 2018-12-12 DIAGNOSIS — Z170 Estrogen receptor positive status [ER+]: Principal | ICD-10-CM

## 2018-12-12 DIAGNOSIS — C50211 Malignant neoplasm of upper-inner quadrant of right female breast: Secondary | ICD-10-CM

## 2018-12-15 ENCOUNTER — Ambulatory Visit
Admission: RE | Admit: 2018-12-15 | Discharge: 2018-12-15 | Disposition: A | Discharge status: Home / Self Care | Payer: Medicare Other | Source: Ambulatory Visit | Attending: Radiation Oncology | Admitting: Radiation Oncology

## 2018-12-15 DIAGNOSIS — C50211 Malignant neoplasm of upper-inner quadrant of right female breast: Secondary | ICD-10-CM | POA: Diagnosis not present

## 2018-12-15 DIAGNOSIS — Z170 Estrogen receptor positive status [ER+]: Secondary | ICD-10-CM | POA: Diagnosis not present

## 2018-12-16 ENCOUNTER — Ambulatory Visit
Admission: RE | Admit: 2018-12-16 | Discharge: 2018-12-16 | Disposition: A | Discharge status: Home / Self Care | Payer: Medicare Other | Source: Ambulatory Visit | Attending: Radiation Oncology | Admitting: Radiation Oncology

## 2018-12-16 DIAGNOSIS — C50211 Malignant neoplasm of upper-inner quadrant of right female breast: Secondary | ICD-10-CM | POA: Diagnosis not present

## 2018-12-16 DIAGNOSIS — Z170 Estrogen receptor positive status [ER+]: Secondary | ICD-10-CM | POA: Diagnosis not present

## 2018-12-17 ENCOUNTER — Ambulatory Visit
Admission: RE | Admit: 2018-12-17 | Discharge: 2018-12-17 | Disposition: A | Discharge status: Home / Self Care | Payer: Medicare Other | Source: Ambulatory Visit | Attending: Radiation Oncology | Admitting: Radiation Oncology

## 2018-12-17 DIAGNOSIS — Z170 Estrogen receptor positive status [ER+]: Secondary | ICD-10-CM | POA: Diagnosis not present

## 2018-12-17 DIAGNOSIS — C50211 Malignant neoplasm of upper-inner quadrant of right female breast: Secondary | ICD-10-CM | POA: Diagnosis not present

## 2018-12-18 ENCOUNTER — Ambulatory Visit: Payer: Self-pay

## 2018-12-18 ENCOUNTER — Ambulatory Visit
Admission: RE | Admit: 2018-12-18 | Discharge: 2018-12-18 | Disposition: A | Discharge status: Home / Self Care | Payer: Medicare Other | Source: Ambulatory Visit | Attending: Radiation Oncology | Admitting: Radiation Oncology

## 2018-12-18 DIAGNOSIS — C50211 Malignant neoplasm of upper-inner quadrant of right female breast: Secondary | ICD-10-CM | POA: Diagnosis not present

## 2018-12-18 DIAGNOSIS — H6123 Impacted cerumen, bilateral: Secondary | ICD-10-CM | POA: Diagnosis not present

## 2018-12-18 DIAGNOSIS — Z7901 Long term (current) use of anticoagulants: Secondary | ICD-10-CM

## 2018-12-18 DIAGNOSIS — H7202 Central perforation of tympanic membrane, left ear: Secondary | ICD-10-CM | POA: Diagnosis not present

## 2018-12-18 DIAGNOSIS — Z170 Estrogen receptor positive status [ER+]: Secondary | ICD-10-CM | POA: Diagnosis not present

## 2018-12-18 LAB — POCT INR: INR: 2.3 (ref 2.0–3.0)

## 2018-12-19 ENCOUNTER — Ambulatory Visit
Admission: RE | Admit: 2018-12-19 | Discharge: 2018-12-19 | Disposition: A | Discharge status: Home / Self Care | Payer: Medicare Other | Source: Ambulatory Visit | Attending: Radiation Oncology | Admitting: Radiation Oncology

## 2018-12-19 DIAGNOSIS — C50211 Malignant neoplasm of upper-inner quadrant of right female breast: Secondary | ICD-10-CM | POA: Diagnosis not present

## 2018-12-19 DIAGNOSIS — Z170 Estrogen receptor positive status [ER+]: Secondary | ICD-10-CM | POA: Diagnosis not present

## 2018-12-22 ENCOUNTER — Ambulatory Visit
Admission: RE | Admit: 2018-12-22 | Discharge: 2018-12-22 | Disposition: A | Discharge status: Home / Self Care | Payer: Medicare Other | Source: Ambulatory Visit | Attending: Radiation Oncology | Admitting: Radiation Oncology

## 2018-12-22 DIAGNOSIS — C50211 Malignant neoplasm of upper-inner quadrant of right female breast: Secondary | ICD-10-CM | POA: Diagnosis not present

## 2018-12-22 DIAGNOSIS — Z170 Estrogen receptor positive status [ER+]: Secondary | ICD-10-CM | POA: Diagnosis not present

## 2018-12-23 ENCOUNTER — Ambulatory Visit
Admission: RE | Admit: 2018-12-23 | Discharge: 2018-12-23 | Disposition: A | Discharge status: Home / Self Care | Payer: Medicare Other | Source: Ambulatory Visit | Attending: Radiation Oncology | Admitting: Radiation Oncology

## 2018-12-23 DIAGNOSIS — C50211 Malignant neoplasm of upper-inner quadrant of right female breast: Secondary | ICD-10-CM | POA: Diagnosis not present

## 2018-12-23 DIAGNOSIS — Z170 Estrogen receptor positive status [ER+]: Secondary | ICD-10-CM | POA: Diagnosis not present

## 2018-12-24 ENCOUNTER — Ambulatory Visit
Admission: RE | Admit: 2018-12-24 | Discharge: 2018-12-24 | Disposition: A | Discharge status: Home / Self Care | Payer: Medicare Other | Source: Ambulatory Visit | Attending: Radiation Oncology | Admitting: Radiation Oncology

## 2018-12-24 DIAGNOSIS — C50211 Malignant neoplasm of upper-inner quadrant of right female breast: Secondary | ICD-10-CM | POA: Diagnosis not present

## 2018-12-24 DIAGNOSIS — Z170 Estrogen receptor positive status [ER+]: Secondary | ICD-10-CM | POA: Diagnosis not present

## 2018-12-25 ENCOUNTER — Ambulatory Visit
Admission: RE | Admit: 2018-12-25 | Discharge: 2018-12-25 | Disposition: A | Discharge status: Home / Self Care | Payer: Medicare Other | Source: Ambulatory Visit | Attending: Radiation Oncology | Admitting: Radiation Oncology

## 2018-12-25 DIAGNOSIS — C50211 Malignant neoplasm of upper-inner quadrant of right female breast: Secondary | ICD-10-CM | POA: Diagnosis not present

## 2018-12-25 DIAGNOSIS — Z170 Estrogen receptor positive status [ER+]: Secondary | ICD-10-CM | POA: Diagnosis not present

## 2018-12-26 ENCOUNTER — Ambulatory Visit
Admission: RE | Admit: 2018-12-26 | Discharge: 2018-12-26 | Disposition: A | Discharge status: Home / Self Care | Payer: Medicare Other | Source: Ambulatory Visit | Attending: Radiation Oncology | Admitting: Radiation Oncology

## 2018-12-26 DIAGNOSIS — Z170 Estrogen receptor positive status [ER+]: Secondary | ICD-10-CM | POA: Diagnosis not present

## 2018-12-26 DIAGNOSIS — C50211 Malignant neoplasm of upper-inner quadrant of right female breast: Secondary | ICD-10-CM | POA: Diagnosis not present

## 2018-12-26 NOTE — Patient Instructions (Signed)
INR results:2.3  Spoke with A. Rosana Hoes, RN at [facility] on 12/18/18 to give the following instructions: Continue taking same dose 7.5 mg daily EXCEPT for 3.75mg  on Mon and 5mg  on Friday.  Recheck on 01/12/19.  Hiram Gash, RN will discuss with patient and schedule recheck.

## 2018-12-28 ENCOUNTER — Encounter: Payer: Self-pay | Admitting: Urgent Care

## 2018-12-28 ENCOUNTER — Other Ambulatory Visit: Payer: Self-pay | Admitting: Internal Medicine

## 2018-12-29 ENCOUNTER — Ambulatory Visit
Admission: RE | Admit: 2018-12-29 | Discharge: 2018-12-29 | Disposition: A | Discharge status: Home / Self Care | Payer: Medicare Other | Source: Ambulatory Visit | Attending: Radiation Oncology | Admitting: Radiation Oncology

## 2018-12-29 DIAGNOSIS — C50211 Malignant neoplasm of upper-inner quadrant of right female breast: Secondary | ICD-10-CM | POA: Diagnosis not present

## 2018-12-29 DIAGNOSIS — Z170 Estrogen receptor positive status [ER+]: Secondary | ICD-10-CM | POA: Diagnosis not present

## 2018-12-30 ENCOUNTER — Ambulatory Visit
Admission: RE | Admit: 2018-12-30 | Discharge: 2018-12-30 | Disposition: A | Discharge status: Home / Self Care | Payer: Medicare Other | Source: Ambulatory Visit | Attending: Radiation Oncology | Admitting: Radiation Oncology

## 2018-12-30 DIAGNOSIS — Z170 Estrogen receptor positive status [ER+]: Secondary | ICD-10-CM | POA: Diagnosis not present

## 2018-12-30 DIAGNOSIS — C50211 Malignant neoplasm of upper-inner quadrant of right female breast: Secondary | ICD-10-CM | POA: Diagnosis not present

## 2018-12-31 ENCOUNTER — Inpatient Hospital Stay: Payer: Medicare Other

## 2018-12-31 ENCOUNTER — Ambulatory Visit
Admission: RE | Admit: 2018-12-31 | Discharge: 2018-12-31 | Disposition: A | Discharge status: Home / Self Care | Payer: Medicare Other | Source: Ambulatory Visit | Attending: Radiation Oncology | Admitting: Radiation Oncology

## 2018-12-31 ENCOUNTER — Inpatient Hospital Stay: Payer: Medicare Other | Attending: Radiation Oncology

## 2018-12-31 DIAGNOSIS — C50211 Malignant neoplasm of upper-inner quadrant of right female breast: Secondary | ICD-10-CM | POA: Diagnosis not present

## 2018-12-31 DIAGNOSIS — C50411 Malignant neoplasm of upper-outer quadrant of right female breast: Secondary | ICD-10-CM | POA: Diagnosis not present

## 2018-12-31 DIAGNOSIS — Z170 Estrogen receptor positive status [ER+]: Secondary | ICD-10-CM | POA: Diagnosis not present

## 2018-12-31 LAB — CBC
HEMATOCRIT: 41.1 % (ref 36.0–46.0)
Hemoglobin: 13.5 g/dL (ref 12.0–15.0)
MCH: 31.5 pg (ref 26.0–34.0)
MCHC: 32.8 g/dL (ref 30.0–36.0)
MCV: 95.8 fL (ref 80.0–100.0)
Platelets: 179 10*3/uL (ref 150–400)
RBC: 4.29 MIL/uL (ref 3.87–5.11)
RDW: 12.3 % (ref 11.5–15.5)
WBC: 4.6 10*3/uL (ref 4.0–10.5)
nRBC: 0 % (ref 0.0–0.2)

## 2019-01-01 ENCOUNTER — Encounter: Payer: Self-pay | Admitting: Genetics

## 2019-01-01 ENCOUNTER — Inpatient Hospital Stay (HOSPITAL_BASED_OUTPATIENT_CLINIC_OR_DEPARTMENT_OTHER): Payer: Medicare Other | Admitting: Genetics

## 2019-01-01 ENCOUNTER — Ambulatory Visit
Admission: RE | Admit: 2019-01-01 | Discharge: 2019-01-01 | Disposition: A | Discharge status: Home / Self Care | Payer: Medicare Other | Source: Ambulatory Visit | Attending: Radiation Oncology | Admitting: Radiation Oncology

## 2019-01-01 DIAGNOSIS — Z1379 Encounter for other screening for genetic and chromosomal anomalies: Secondary | ICD-10-CM

## 2019-01-01 DIAGNOSIS — Z7183 Encounter for nonprocreative genetic counseling: Secondary | ICD-10-CM

## 2019-01-01 DIAGNOSIS — Z803 Family history of malignant neoplasm of breast: Secondary | ICD-10-CM

## 2019-01-01 DIAGNOSIS — C50411 Malignant neoplasm of upper-outer quadrant of right female breast: Secondary | ICD-10-CM

## 2019-01-01 DIAGNOSIS — Z85820 Personal history of malignant melanoma of skin: Secondary | ICD-10-CM | POA: Diagnosis not present

## 2019-01-01 DIAGNOSIS — C50911 Malignant neoplasm of unspecified site of right female breast: Secondary | ICD-10-CM

## 2019-01-01 DIAGNOSIS — Z170 Estrogen receptor positive status [ER+]: Secondary | ICD-10-CM | POA: Diagnosis not present

## 2019-01-01 DIAGNOSIS — C50211 Malignant neoplasm of upper-inner quadrant of right female breast: Secondary | ICD-10-CM | POA: Diagnosis not present

## 2019-01-01 NOTE — Progress Notes (Signed)
REFERRING PROVIDER: Lequita Asal, MD [address] [place], [place] [address]  PRIMARY PROVIDER:  Jearld Fenton, NP  PRIMARY REASON FOR VISIT:  1. Carcinoma of upper-outer quadrant of right breast in female, estrogen receptor positive ([place])   2. Family history of breast cancer   3. Malignant neoplasm of right female breast, unspecified estrogen receptor status, unspecified site of breast ([place])   4. History of melanoma   5. Genetic testing     HISTORY OF PRESENT ILLNESS:   Becky Farrell, a 85 y.o. female, was seen for a [place] cancer genetics consultation at the request of Becky Farrell due to a personal and family history of breast cancer.  Becky Farrell presents to clinic today to discuss the possibility of a hereditary predisposition to cancer, genetic testing, and to further clarify her future cancer risks, as well as potential cancer risks for family members.   In 2019, at the age of 6, Becky Farrell was diagnosed with Invasive ductal carcinoma of the right breast, ER/PR+, HER2 neg.  She had a right lumpectomy on 10/10/2018.  She also has a history of melanoma dx in 2003. She is now undergoing adjuvant radiation therapy.    Ovaries intact: yes.  Hysterectomy: yes.  Menopausal status: postmenopausal.  Colonoscopy: yes; reports a history of a few polyps, no major issues per pt report.   Past Medical History:  Diagnosis Date  . Colon polyps   . Family history of breast cancer   . History of pulmonary embolism 2011  . Hypertension   . Melanoma ([place]) 2003  . Seizures ([place]) 1993   no meds since (approx) 2014  . Vertigo    x1. R/T perforated ear drum prior to 2013    Past Surgical History:  Procedure Laterality Date  . ABDOMINAL HYSTERECTOMY  05/2014  . BREAST LUMPECTOMY WITH RADIOACTIVE SEED LOCALIZATION Right 10/10/2018   Procedure: BREAST LUMPECTOMY WITH RADIOACTIVE SEED LOCALIZATION;  Surgeon: Becky Kussmaul, MD;  Location: [facility];  Service: General;  Laterality:  Right;  . CATARACT EXTRACTION W/PHACO Right 01/06/2018   Procedure: CATARACT EXTRACTION PHACO AND INTRAOCULAR LENS PLACEMENT ([place]) RIGHT;  Surgeon: Becky Koyanagi, MD;  Location: [facility];  Service: Ophthalmology;  Laterality: Right;  . CATARACT EXTRACTION W/PHACO Left 01/29/2018   Procedure: CATARACT EXTRACTION PHACO AND INTRAOCULAR LENS PLACEMENT ([place]) LEFT;  Surgeon: Becky Koyanagi, MD;  Location: [facility];  Service: Ophthalmology;  Laterality: Left;  . left hand surgery  05/2011   s/p fall  . OTHER SURGICAL HISTORY  2003   Removal of lymphoma from melonoma 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     Social History   Socioeconomic History  . Marital status: Married    Spouse name: Not on file  . Number of children: Not on file  . Years of education: Not on file  . Highest education level: Not on file  Occupational History  . Not on file  Social Needs  . Financial resource strain: Not on file  . Food insecurity:    Worry: Not on file    Inability: Not on file  . Transportation needs:    Medical: Not on file    Non-medical: Not on file  Tobacco Use  . Smoking status: Never Smoker  . Smokeless tobacco: Never Used  Substance and Sexual Activity  . Alcohol use: No    Frequency: Never  . Drug use: Never  . Sexual activity: Never  Lifestyle  . Physical activity:    Days  per week: Not on file    Minutes per session: Not on file  . Stress: Not on file  Relationships  . Social connections:    Talks on phone: Not on file    Gets together: Not on file    Attends religious service: Not on file    Active member of club or organization: Not on file    Attends meetings of clubs or organizations: Not on file    Relationship status: Not on file  Other Topics Concern  . Not on file  Social History Narrative   Recently moved from [place] to [place]   Daughter and grandchildren live in [place].   Has other children in [place].   Retired Pharmacist, hospital.      Husband has been  recovering from Prostate CA.     FAMILY HISTORY:  We obtained a detailed, 4-generation family history.  Significant diagnoses are listed below: Family History  Problem Relation Age of Onset  . Heart disease Mother   . Aortic aneurysm Father   . Breast cancer Daughter 52  . Breast cancer Niece 70    Becky Farrell has 2 daughters and 2 sons in their 35's.  1 daughter was dx with breast cancer at 57.  Becky Farrell has several grandchildren.   Becky Farrell has a sister who is 32 and 2 brothers in their 67's.  One of her brother's daughters, Becky Farrell, was dx with breast cancer in her early 48's.   Becky Farrell father:  Died of an aortic aneurysm,  No hx of cancer.  Paternal Aunts/Uncles: 2 paternal aunts, 2 paternal uncles with no hx of cancer.  Paternal cousins:1 cousins' daughter had breast cancer.  Paternal grandfather: died young, no known hx cancer Paternal grandmother:died young, no known hx of cancer.   Becky Farrell mother: died in her 18's due to heart disease.  Maternal Aunts/Uncles: 3 maternal aunts and 2 maternal uncles with no known hx of cancer.  Maternal cousins: no known hx of cancer.  Maternal grandfather: died young, unk health hx Maternal grandmother:unk, no known hx of cancer.   Becky Farrell is unaware of previous family history of genetic testing for hereditary cancer risks. Patient's maternal ancestors are of [place] descent, and paternal ancestors are of [place] descent. There is no reported Ashkenazi Jewish ancestry. There is no known consanguinity.  GENETIC COUNSELING ASSESSMENT: Becky Farrell is a 85 y.o. female with a personal and family history which is somewhat suggestive of a Hereditary Cancer Predisposition Syndrome. We, therefore, discussed and recommended the following at today's visit.   GENETIC TEST RESULTS: Genetic testing performed through [facility]'s Multi-Cancer Panel reported out on 11/16/2018 showed no pathogenic mutations. The Multi-Cancer Panel offered by  [facility] includes sequencing and/or deletion duplication testing of the following 84 genes: AIP,ALK, APC, ATM, AXIN2,BAP1,  BARD1, BLM, BMPR1A, BRCA1, BRCA2, BRIP1, CASR, CDC73, CDH1, CDK4, CDKN1B, CDKN1C, CDKN2A (p14ARF), CDKN2A (p16INK4a), CEBPA, CHEK2, CTNNA1, DICER1, DIS3L2, EGFR (c.2369C>T, p.Thr790Met variant only), EPCAM (Deletion/duplication testing only), FH, FLCN, GATA2, GPC3, GREM1 (Promoter region deletion/duplication testing only), HOXB13 (c.251G>A, p.Gly84Glu), HRAS, KIT, MAX, MEN1, MET, MITF (c.952G>A, p.Glu318Lys variant only), MLH1, MSH2, MSH3, MSH6, MUTYH, NBN, NF1, NF2, NTHL1, PALB2, PDGFRA, PHOX2B, PMS2, POLD1, POLE, POT1, PRKAR1A, PTCH1, PTEN, RAD50, RAD51C, RAD51D, RB1, RECQL4, RET, RUNX1, SDHAF2, SDHA (sequence changes only), SDHB, SDHC, SDHD, SMAD4, SMARCA4, SMARCB1, SMARCE1, STK11, SUFU, TERC, TERT, TMEM127, TP53, TSC1, TSC2, VHL, WRN and WT1.   The test report will be scanned into EPIC and will be located  under the Molecular Pathology section of the Results Review tab. A portion of the result report is included below for reference.     We discussed with Becky Farrell that because current genetic testing is not perfect, it is possible there may be a gene mutation in one of these genes that current testing cannot detect, but that chance is small.  We also discussed, that there could be another gene that has not yet been discovered, or that we have not yet tested, that is responsible for the cancer diagnoses in the family. It is also possible there is a hereditary cause for the cancer in the family that Becky Farrell did not inherit and therefore was not identified in her testing.  Therefore, it is important to remain in touch with cancer genetics in the future so that we can continue to offer Becky Farrell the most up to date genetic testing.   ADDITIONAL GENETIC TESTING: We discussed with Becky Farrell that her genetic testing was fairly extensive.  If there are are genes identified to  increase cancer risk that can be analyzed in the future, we would be happy to discuss and coordinate this testing at that time.    CANCER SCREENING RECOMMENDATIONS: Becky Farrell test result is negative (normal).  This means that we have not identified a hereditary cause for her personal and family history of cancer at this time.   While reassuring, this does not definitively rule out a hereditary predisposition to cancer. It is still possible that there could be genetic mutations that are undetectable by current technology, or genetic mutations in genes that have not been tested or identified to increase cancer risk.  Therefore, it is recommended she continue to follow the cancer management and screening guidelines provided by her oncology and primary healthcare provider. An individual's cancer risk is not determined by genetic test results alone.  Overall cancer risk assessment includes additional factors such as personal medical history, family history, etc.  These should be used to make a personalized plan for cancer prevention and surveillance.    RECOMMENDATIONS FOR FAMILY MEMBERS:  Relatives in this family might be at some increased risk of developing cancer, over the general population risk, simply due to the family history of cancer.  We recommended women in this family have a yearly mammogram beginning at age 55, or 64 years younger than the earliest onset of cancer, an annual clinical breast exam, and perform monthly breast self-exams. Women in this family should also have a gynecological exam as recommended by their primary provider. All family members should have a colonoscopy by age 4 (or as directed by their doctors).  All family members should inform their physicians about the family history of cancer so their doctors can make the most appropriate screening recommendations for them.   Based on her daughter's history of breast cancer at 2, we recommended this daughter's children start  breast screening at age 61 (or as directed by their doctors).   It is also possible there is a hereditary cause for the cancer in Becky Farrell's family that she did not inherit and therefore was not identified in her.  Therefore, we recommended her daughter and niece with cancer also have genetic counseling and testing. Becky Farrell will let us know if we can be of any assistance in coordinating genetic counseling and/or testing for these family members.   FOLLOW-UP: Lastly, we discussed with Becky Farrell that cancer genetics is a rapidly advancing field and it is possible that  new genetic tests will be appropriate for her and/or her family members in the future. We encouraged her to remain in contact with cancer genetics on an annual basis so we can update her personal and family histories and let her know of advances in cancer genetics that may benefit this family.   Our contact number was provided. Ms. Rickett questions were answered to her satisfaction, and she knows she is welcome to call us at anytime with additional questions or concerns.   Ferol Luz, MS, [facility] Certified Genetic Counselor [email or web address]  The patient was seen for a total of 25 minutes in face-to-face genetic counseling.  The patient was accompanied today by her Husband. This patient was discussed with Drs. Magrinat, Lindi Adie and/or Burr Medico who agrees with the above.

## 2019-01-02 ENCOUNTER — Ambulatory Visit
Admission: RE | Admit: 2019-01-02 | Discharge: 2019-01-02 | Disposition: A | Discharge status: Home / Self Care | Payer: Medicare Other | Source: Ambulatory Visit | Attending: Radiation Oncology | Admitting: Radiation Oncology

## 2019-01-02 ENCOUNTER — Encounter: Payer: Self-pay | Admitting: Genetics

## 2019-01-02 DIAGNOSIS — C50211 Malignant neoplasm of upper-inner quadrant of right female breast: Secondary | ICD-10-CM | POA: Diagnosis not present

## 2019-01-02 DIAGNOSIS — Z170 Estrogen receptor positive status [ER+]: Secondary | ICD-10-CM | POA: Diagnosis not present

## 2019-01-05 ENCOUNTER — Ambulatory Visit
Admission: RE | Admit: 2019-01-05 | Discharge: 2019-01-05 | Disposition: A | Discharge status: Home / Self Care | Payer: Medicare Other | Source: Ambulatory Visit | Attending: Radiation Oncology | Admitting: Radiation Oncology

## 2019-01-05 ENCOUNTER — Ambulatory Visit (INDEPENDENT_AMBULATORY_CARE_PROVIDER_SITE_OTHER): Payer: Medicare Other | Admitting: Podiatry

## 2019-01-05 ENCOUNTER — Encounter: Payer: Self-pay | Admitting: Podiatry

## 2019-01-05 DIAGNOSIS — M79676 Pain in unspecified toe(s): Secondary | ICD-10-CM

## 2019-01-05 DIAGNOSIS — B351 Tinea unguium: Secondary | ICD-10-CM

## 2019-01-05 DIAGNOSIS — C50211 Malignant neoplasm of upper-inner quadrant of right female breast: Secondary | ICD-10-CM | POA: Diagnosis not present

## 2019-01-05 DIAGNOSIS — Z170 Estrogen receptor positive status [ER+]: Secondary | ICD-10-CM | POA: Diagnosis not present

## 2019-01-05 DIAGNOSIS — M79609 Pain in unspecified limb: Principal | ICD-10-CM

## 2019-01-05 DIAGNOSIS — D689 Coagulation defect, unspecified: Secondary | ICD-10-CM

## 2019-01-05 NOTE — Progress Notes (Signed)
Complaint:  Visit Type: Patient returns to my office for continued preventative foot care services. Complaint: Patient states" my nails have grown long and thick and become painful to walk and wear shoes"  The patient presents for preventative foot care services. No changes to ROS.  Patient is taking coumadin.  Podiatric Exam: Vascular: dorsalis pedis and posterior tibial pulses are palpable bilateral. Capillary return is immediate. Temperature gradient is WNL. Skin turgor WNL  Sensorium: Normal Semmes Weinstein monofilament test. Normal tactile sensation bilaterally. Nail Exam: Pt has thick disfigured discolored nails with subungual debris noted bilateral entire nail hallux through fifth toenails Ulcer Exam: There is no evidence of ulcer or pre-ulcerative changes or infection. Orthopedic Exam: Muscle tone and strength are WNL. No limitations in general ROM. No crepitus or effusions noted. Foot type and digits show no abnormalities. Bony prominences are unremarkable. Skin: No Porokeratosis. No infection or ulcers  Diagnosis:  Onychomycosis, , Pain in right toe, pain in left toes  Treatment & Plan Procedures and Treatment: Consent by patient was obtained for treatment procedures. The patient understood the discussion of treatment and procedures well. All questions were answered thoroughly reviewed. Debridement of mycotic and hypertrophic toenails, 1 through 5 bilateral and clearing of subungual debris. No ulceration, no infection noted.  Return Visit-Office Procedure: Patient instructed to return to the office for a follow up visit 3 months for continued evaluation and treatment.    Yidel Teuscher DPM 

## 2019-01-06 ENCOUNTER — Ambulatory Visit
Admission: RE | Admit: 2019-01-06 | Discharge: 2019-01-06 | Disposition: A | Discharge status: Home / Self Care | Payer: Medicare Other | Source: Ambulatory Visit | Attending: Radiation Oncology | Admitting: Radiation Oncology

## 2019-01-06 DIAGNOSIS — Z170 Estrogen receptor positive status [ER+]: Secondary | ICD-10-CM | POA: Diagnosis not present

## 2019-01-06 DIAGNOSIS — C50211 Malignant neoplasm of upper-inner quadrant of right female breast: Secondary | ICD-10-CM | POA: Diagnosis not present

## 2019-01-07 ENCOUNTER — Other Ambulatory Visit: Payer: Self-pay | Admitting: Internal Medicine

## 2019-01-07 ENCOUNTER — Ambulatory Visit
Admission: RE | Admit: 2019-01-07 | Discharge: 2019-01-07 | Disposition: A | Discharge status: Home / Self Care | Payer: Medicare Other | Source: Ambulatory Visit | Attending: Radiation Oncology | Admitting: Radiation Oncology

## 2019-01-07 DIAGNOSIS — C50211 Malignant neoplasm of upper-inner quadrant of right female breast: Secondary | ICD-10-CM | POA: Diagnosis not present

## 2019-01-07 DIAGNOSIS — Z170 Estrogen receptor positive status [ER+]: Secondary | ICD-10-CM | POA: Diagnosis not present

## 2019-01-08 ENCOUNTER — Ambulatory Visit
Admission: RE | Admit: 2019-01-08 | Discharge: 2019-01-08 | Disposition: A | Discharge status: Home / Self Care | Payer: Medicare Other | Source: Ambulatory Visit | Attending: Radiation Oncology | Admitting: Radiation Oncology

## 2019-01-08 DIAGNOSIS — C50211 Malignant neoplasm of upper-inner quadrant of right female breast: Secondary | ICD-10-CM | POA: Diagnosis not present

## 2019-01-08 DIAGNOSIS — Z170 Estrogen receptor positive status [ER+]: Secondary | ICD-10-CM | POA: Diagnosis not present

## 2019-01-09 ENCOUNTER — Ambulatory Visit
Admission: RE | Admit: 2019-01-09 | Discharge: 2019-01-09 | Disposition: A | Discharge status: Home / Self Care | Payer: Medicare Other | Source: Ambulatory Visit | Attending: Radiation Oncology | Admitting: Radiation Oncology

## 2019-01-09 DIAGNOSIS — C50211 Malignant neoplasm of upper-inner quadrant of right female breast: Secondary | ICD-10-CM | POA: Diagnosis not present

## 2019-01-09 DIAGNOSIS — Z170 Estrogen receptor positive status [ER+]: Secondary | ICD-10-CM | POA: Diagnosis not present

## 2019-01-12 ENCOUNTER — Ambulatory Visit
Admission: RE | Admit: 2019-01-12 | Discharge: 2019-01-12 | Disposition: A | Discharge status: Home / Self Care | Payer: Medicare Other | Source: Ambulatory Visit | Attending: Radiation Oncology | Admitting: Radiation Oncology

## 2019-01-12 ENCOUNTER — Ambulatory Visit: Payer: Self-pay

## 2019-01-12 DIAGNOSIS — Z7901 Long term (current) use of anticoagulants: Secondary | ICD-10-CM

## 2019-01-12 DIAGNOSIS — Z170 Estrogen receptor positive status [ER+]: Secondary | ICD-10-CM | POA: Diagnosis not present

## 2019-01-12 DIAGNOSIS — C50211 Malignant neoplasm of upper-inner quadrant of right female breast: Secondary | ICD-10-CM | POA: Diagnosis not present

## 2019-01-12 LAB — POCT INR: INR: 2.3 (ref 2.0–3.0)

## 2019-01-13 ENCOUNTER — Ambulatory Visit
Admission: RE | Admit: 2019-01-13 | Discharge: 2019-01-13 | Disposition: A | Discharge status: Home / Self Care | Payer: Medicare Other | Source: Ambulatory Visit | Attending: Radiation Oncology | Admitting: Radiation Oncology

## 2019-01-13 DIAGNOSIS — Z170 Estrogen receptor positive status [ER+]: Secondary | ICD-10-CM | POA: Diagnosis not present

## 2019-01-13 DIAGNOSIS — C50211 Malignant neoplasm of upper-inner quadrant of right female breast: Secondary | ICD-10-CM | POA: Diagnosis not present

## 2019-01-14 ENCOUNTER — Ambulatory Visit
Admission: RE | Admit: 2019-01-14 | Discharge: 2019-01-14 | Disposition: A | Discharge status: Home / Self Care | Payer: Medicare Other | Source: Ambulatory Visit | Attending: Radiation Oncology | Admitting: Radiation Oncology

## 2019-01-14 ENCOUNTER — Inpatient Hospital Stay: Payer: Medicare Other | Attending: Radiation Oncology

## 2019-01-14 DIAGNOSIS — C50211 Malignant neoplasm of upper-inner quadrant of right female breast: Secondary | ICD-10-CM

## 2019-01-14 DIAGNOSIS — C50411 Malignant neoplasm of upper-outer quadrant of right female breast: Secondary | ICD-10-CM | POA: Insufficient documentation

## 2019-01-14 DIAGNOSIS — Z7901 Long term (current) use of anticoagulants: Secondary | ICD-10-CM | POA: Diagnosis not present

## 2019-01-14 DIAGNOSIS — Z86711 Personal history of pulmonary embolism: Secondary | ICD-10-CM | POA: Insufficient documentation

## 2019-01-14 DIAGNOSIS — Z170 Estrogen receptor positive status [ER+]: Secondary | ICD-10-CM | POA: Insufficient documentation

## 2019-01-14 DIAGNOSIS — I10 Essential (primary) hypertension: Secondary | ICD-10-CM | POA: Insufficient documentation

## 2019-01-14 LAB — CBC
HEMATOCRIT: 39.2 % (ref 36.0–46.0)
HEMOGLOBIN: 12.9 g/dL (ref 12.0–15.0)
MCH: 31.5 pg (ref 26.0–34.0)
MCHC: 32.9 g/dL (ref 30.0–36.0)
MCV: 95.6 fL (ref 80.0–100.0)
Platelets: 169 10*3/uL (ref 150–400)
RBC: 4.1 MIL/uL (ref 3.87–5.11)
RDW: 12.1 % (ref 11.5–15.5)
WBC: 4.4 10*3/uL (ref 4.0–10.5)
nRBC: 0 % (ref 0.0–0.2)

## 2019-01-15 ENCOUNTER — Ambulatory Visit
Admission: RE | Admit: 2019-01-15 | Discharge: 2019-01-15 | Disposition: A | Discharge status: Home / Self Care | Payer: Medicare Other | Source: Ambulatory Visit | Attending: Radiation Oncology | Admitting: Radiation Oncology

## 2019-01-15 DIAGNOSIS — C50211 Malignant neoplasm of upper-inner quadrant of right female breast: Secondary | ICD-10-CM | POA: Diagnosis not present

## 2019-01-15 DIAGNOSIS — Z170 Estrogen receptor positive status [ER+]: Secondary | ICD-10-CM | POA: Diagnosis not present

## 2019-01-15 NOTE — Patient Instructions (Signed)
INR results:2.3  Spoke with A. Rosana Hoes, RN at [facility] on 01/12/19 to give the following instructions: Continue taking same dose 7.5 mg daily EXCEPT for 3.75mg  on Mon and 5mg  on Friday.  Recheck on 02/12/19.  Hiram Gash, RN will discuss with patient and schedule recheck.

## 2019-01-16 ENCOUNTER — Ambulatory Visit
Admission: RE | Admit: 2019-01-16 | Discharge: 2019-01-16 | Disposition: A | Discharge status: Home / Self Care | Payer: Medicare Other | Source: Ambulatory Visit | Attending: Radiation Oncology | Admitting: Radiation Oncology

## 2019-01-16 DIAGNOSIS — C50211 Malignant neoplasm of upper-inner quadrant of right female breast: Secondary | ICD-10-CM | POA: Diagnosis not present

## 2019-01-16 DIAGNOSIS — Z170 Estrogen receptor positive status [ER+]: Secondary | ICD-10-CM | POA: Diagnosis not present

## 2019-01-19 ENCOUNTER — Ambulatory Visit
Admission: RE | Admit: 2019-01-19 | Discharge: 2019-01-19 | Disposition: A | Discharge status: Home / Self Care | Payer: Medicare Other | Source: Ambulatory Visit | Attending: Radiation Oncology | Admitting: Radiation Oncology

## 2019-01-19 ENCOUNTER — Other Ambulatory Visit: Payer: Self-pay | Admitting: Family Medicine

## 2019-01-19 DIAGNOSIS — C50211 Malignant neoplasm of upper-inner quadrant of right female breast: Secondary | ICD-10-CM | POA: Diagnosis not present

## 2019-01-19 DIAGNOSIS — Z170 Estrogen receptor positive status [ER+]: Secondary | ICD-10-CM | POA: Diagnosis not present

## 2019-01-19 NOTE — Telephone Encounter (Signed)
RB-Plz see refill req/thx dmf 

## 2019-01-20 ENCOUNTER — Ambulatory Visit
Admission: RE | Admit: 2019-01-20 | Discharge: 2019-01-20 | Disposition: A | Discharge status: Home / Self Care | Payer: Medicare Other | Source: Ambulatory Visit | Attending: Radiation Oncology | Admitting: Radiation Oncology

## 2019-01-20 DIAGNOSIS — Z170 Estrogen receptor positive status [ER+]: Secondary | ICD-10-CM | POA: Diagnosis not present

## 2019-01-20 DIAGNOSIS — C50211 Malignant neoplasm of upper-inner quadrant of right female breast: Secondary | ICD-10-CM | POA: Diagnosis not present

## 2019-01-21 ENCOUNTER — Ambulatory Visit
Admission: RE | Admit: 2019-01-21 | Discharge: 2019-01-21 | Disposition: A | Discharge status: Home / Self Care | Payer: Medicare Other | Source: Ambulatory Visit | Attending: Radiation Oncology | Admitting: Radiation Oncology

## 2019-01-21 DIAGNOSIS — Z170 Estrogen receptor positive status [ER+]: Secondary | ICD-10-CM | POA: Diagnosis not present

## 2019-01-21 DIAGNOSIS — C50211 Malignant neoplasm of upper-inner quadrant of right female breast: Secondary | ICD-10-CM | POA: Diagnosis not present

## 2019-01-21 IMAGING — MG MM PLC BREAST LOC DEV 1ST LESION INC*R*
3 series · 3 of 3 positions shown · non-contrast
Comparison: Previous exam(s).

CLINICAL DATA: Patient for preoperative localization prior to right
breast lumpectomy.

EXAM:
MAMMOGRAPHIC GUIDED RADIOACTIVE SEED LOCALIZATION OF THE RIGHT
BREAST

[R CC (1 of 2)]
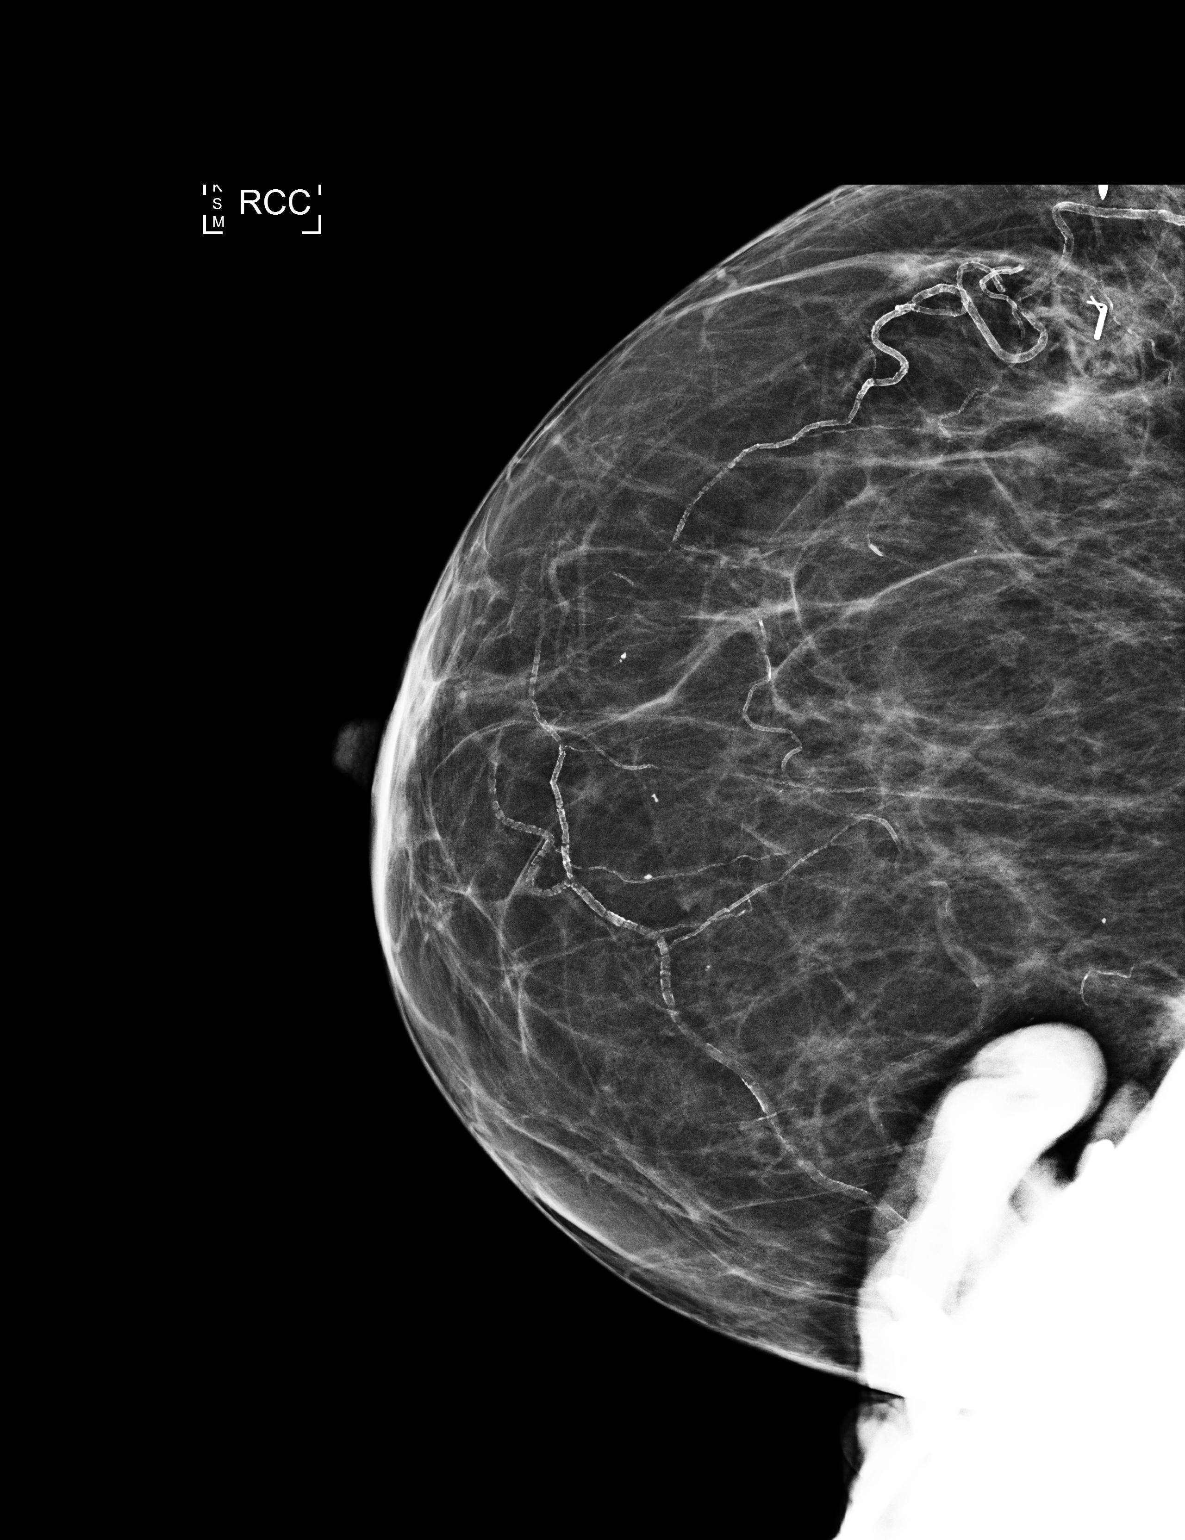

[R LM]
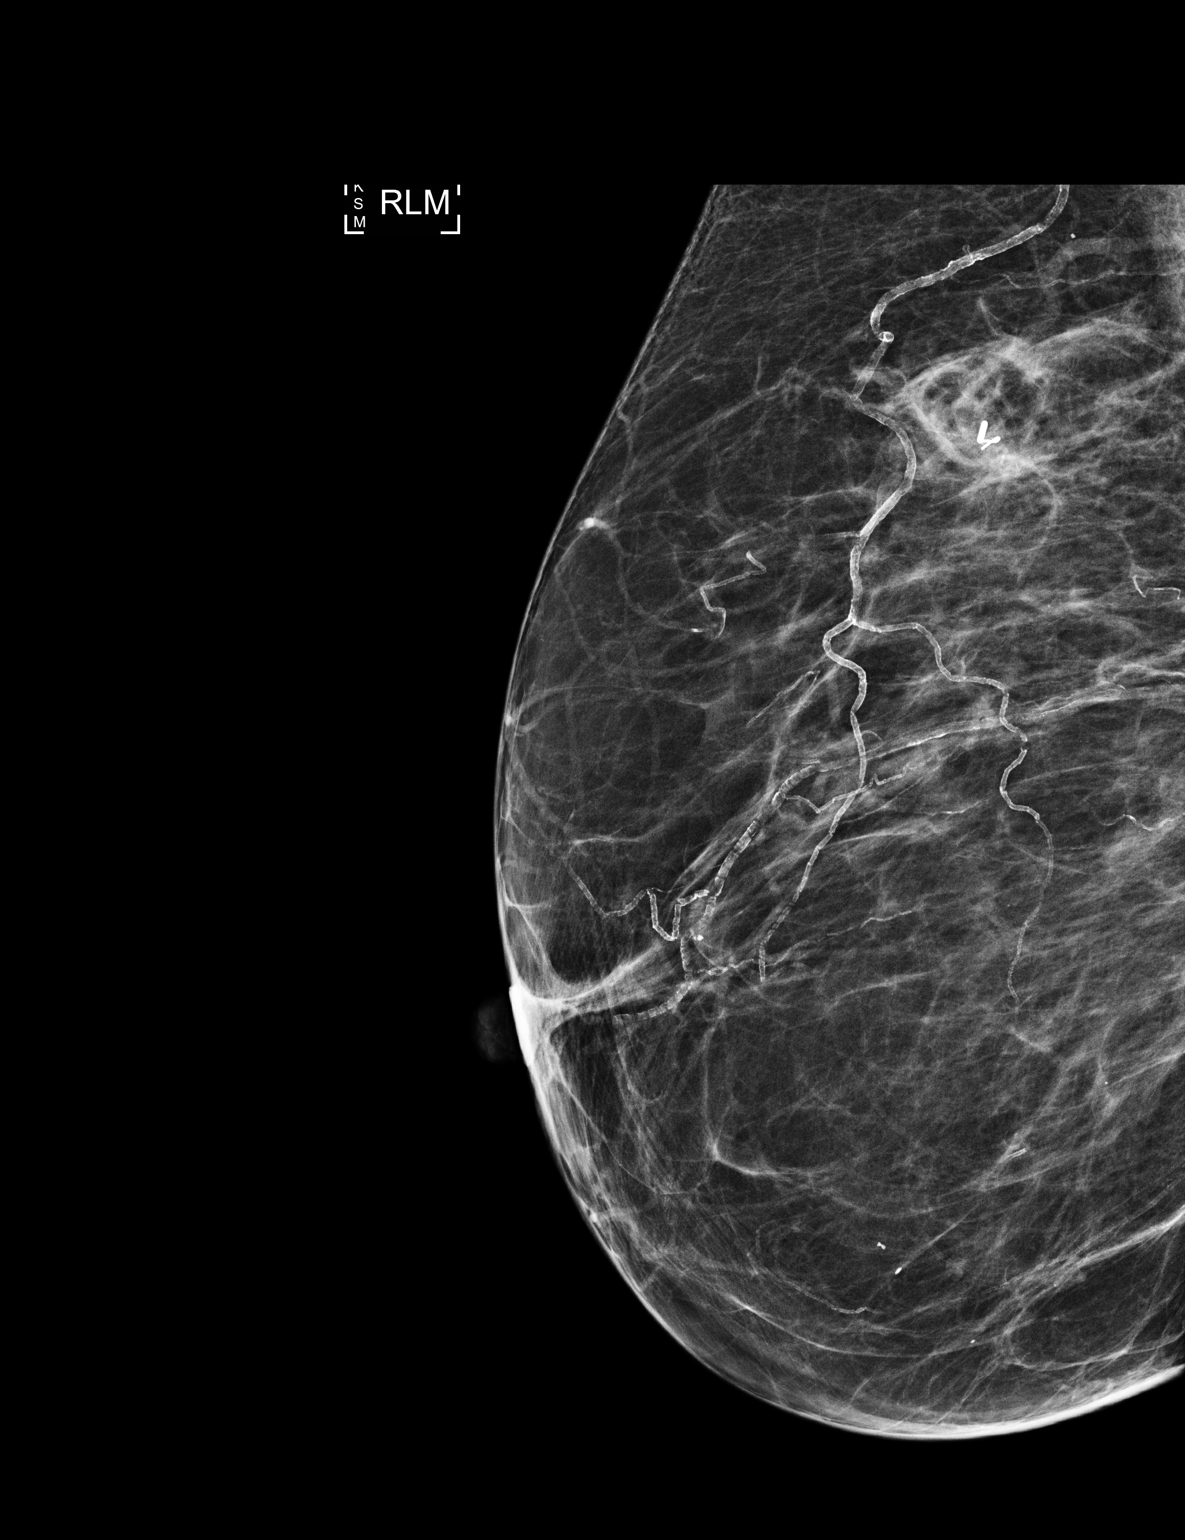

[R CC (2 of 2)]
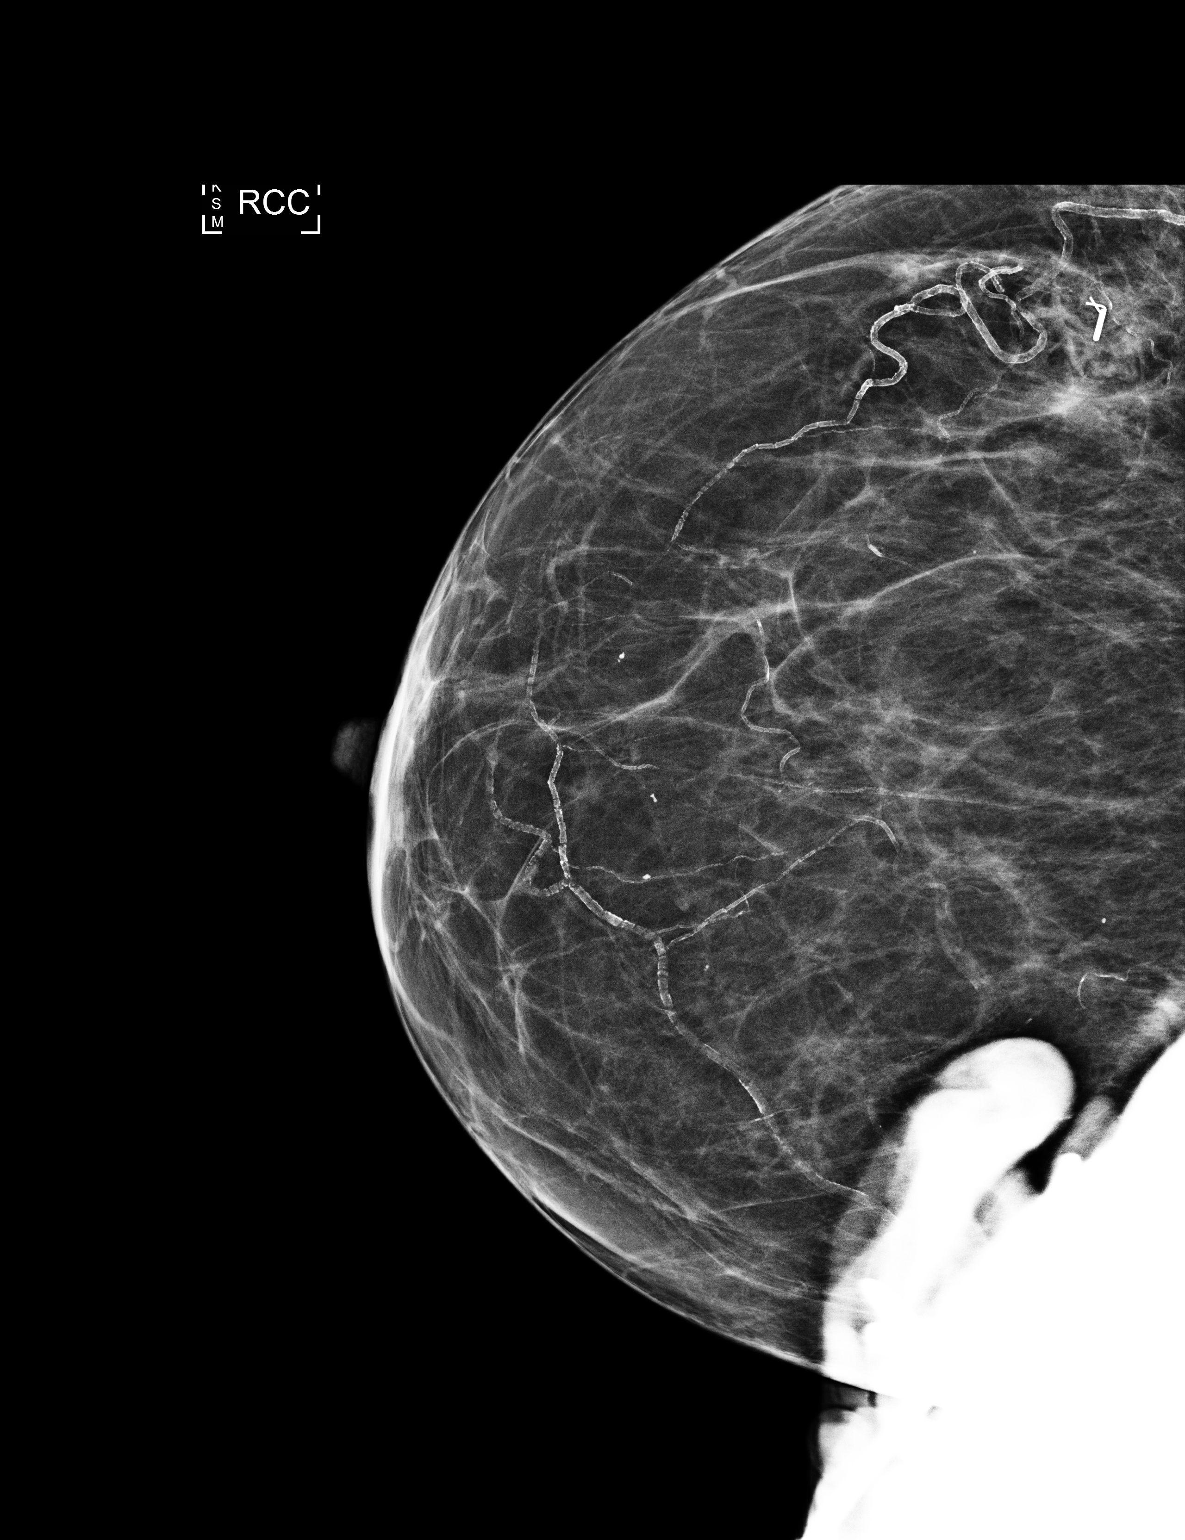

[3 of 3 positions shown; findings below may reference images not displayed]

FINDINGS: Patient presents for radioactive seed localization prior to right
breast lumpectomy. I met with the patient and we discussed the
procedure of seed localization including benefits and alternatives.
We discussed the high likelihood of a successful procedure. We
discussed the risks of the procedure including infection, bleeding,
tissue injury and further surgery. We discussed the low dose of
radioactivity involved in the procedure. Informed, written consent
was given.

The usual time-out protocol was performed immediately prior to the
procedure.

Using mammographic guidance, sterile technique, 1% lidocaine and an
[employer] radioactive seed, ribbon shaped marking clip was localized
using a lateral approach. The follow-up mammogram images confirm the
seed in the expected location and were marked for Dr. Mbekele.

Follow-up survey of the patient confirms presence of the radioactive
seed.

Order number of [employer] seed:  [id number].

Total activity:  0.248 millicuries reference Date: 09/10/2018

The patient tolerated the procedure well and was released from the
[REDACTED]. She was given instructions regarding seed removal.
IMPRESSION: Radioactive seed localization right breast. No apparent
complications.

## 2019-01-22 ENCOUNTER — Ambulatory Visit
Admission: RE | Admit: 2019-01-22 | Discharge: 2019-01-22 | Disposition: A | Discharge status: Home / Self Care | Payer: Medicare Other | Source: Ambulatory Visit | Attending: Radiation Oncology | Admitting: Radiation Oncology

## 2019-01-22 DIAGNOSIS — C50211 Malignant neoplasm of upper-inner quadrant of right female breast: Secondary | ICD-10-CM | POA: Diagnosis not present

## 2019-01-22 DIAGNOSIS — Z170 Estrogen receptor positive status [ER+]: Secondary | ICD-10-CM | POA: Diagnosis not present

## 2019-01-23 ENCOUNTER — Ambulatory Visit
Admission: RE | Admit: 2019-01-23 | Discharge: 2019-01-23 | Disposition: A | Discharge status: Home / Self Care | Payer: Medicare Other | Source: Ambulatory Visit | Attending: Radiation Oncology | Admitting: Radiation Oncology

## 2019-01-23 DIAGNOSIS — C50211 Malignant neoplasm of upper-inner quadrant of right female breast: Secondary | ICD-10-CM | POA: Diagnosis not present

## 2019-01-26 ENCOUNTER — Ambulatory Visit
Admission: RE | Admit: 2019-01-26 | Discharge: 2019-01-26 | Disposition: A | Discharge status: Home / Self Care | Payer: Medicare Other | Source: Ambulatory Visit | Attending: Radiation Oncology | Admitting: Radiation Oncology

## 2019-01-26 DIAGNOSIS — Z170 Estrogen receptor positive status [ER+]: Secondary | ICD-10-CM | POA: Diagnosis not present

## 2019-01-26 DIAGNOSIS — C50211 Malignant neoplasm of upper-inner quadrant of right female breast: Secondary | ICD-10-CM | POA: Diagnosis not present

## 2019-01-27 ENCOUNTER — Ambulatory Visit
Admission: RE | Admit: 2019-01-27 | Discharge: 2019-01-27 | Disposition: A | Discharge status: Home / Self Care | Payer: Medicare Other | Source: Ambulatory Visit | Attending: Radiation Oncology | Admitting: Radiation Oncology

## 2019-01-27 DIAGNOSIS — C50211 Malignant neoplasm of upper-inner quadrant of right female breast: Secondary | ICD-10-CM | POA: Diagnosis not present

## 2019-01-28 ENCOUNTER — Ambulatory Visit
Admission: RE | Admit: 2019-01-28 | Discharge: 2019-01-28 | Disposition: A | Discharge status: Home / Self Care | Payer: Medicare Other | Source: Ambulatory Visit | Attending: Radiation Oncology | Admitting: Radiation Oncology

## 2019-01-28 ENCOUNTER — Inpatient Hospital Stay: Payer: Medicare Other

## 2019-01-28 DIAGNOSIS — C50211 Malignant neoplasm of upper-inner quadrant of right female breast: Secondary | ICD-10-CM

## 2019-01-28 DIAGNOSIS — Z86711 Personal history of pulmonary embolism: Secondary | ICD-10-CM | POA: Diagnosis not present

## 2019-01-28 DIAGNOSIS — C50411 Malignant neoplasm of upper-outer quadrant of right female breast: Secondary | ICD-10-CM | POA: Diagnosis not present

## 2019-01-28 DIAGNOSIS — Z7901 Long term (current) use of anticoagulants: Secondary | ICD-10-CM | POA: Diagnosis not present

## 2019-01-28 DIAGNOSIS — Z170 Estrogen receptor positive status [ER+]: Principal | ICD-10-CM

## 2019-01-28 DIAGNOSIS — I10 Essential (primary) hypertension: Secondary | ICD-10-CM | POA: Diagnosis not present

## 2019-01-28 LAB — CBC
HEMATOCRIT: 38.8 % (ref 36.0–46.0)
Hemoglobin: 13.3 g/dL (ref 12.0–15.0)
MCH: 32.3 pg (ref 26.0–34.0)
MCHC: 34.3 g/dL (ref 30.0–36.0)
MCV: 94.2 fL (ref 80.0–100.0)
NRBC: 0 % (ref 0.0–0.2)
PLATELETS: 163 10*3/uL (ref 150–400)
RBC: 4.12 MIL/uL (ref 3.87–5.11)
RDW: 12.2 % (ref 11.5–15.5)
WBC: 4.3 10*3/uL (ref 4.0–10.5)

## 2019-01-29 ENCOUNTER — Ambulatory Visit
Admission: RE | Admit: 2019-01-29 | Discharge: 2019-01-29 | Disposition: A | Discharge status: Home / Self Care | Payer: Medicare Other | Source: Ambulatory Visit | Attending: Radiation Oncology | Admitting: Radiation Oncology

## 2019-01-29 DIAGNOSIS — C50211 Malignant neoplasm of upper-inner quadrant of right female breast: Secondary | ICD-10-CM | POA: Diagnosis not present

## 2019-01-30 ENCOUNTER — Ambulatory Visit
Admission: RE | Admit: 2019-01-30 | Discharge: 2019-01-30 | Disposition: A | Discharge status: Home / Self Care | Payer: Medicare Other | Source: Ambulatory Visit | Attending: Radiation Oncology | Admitting: Radiation Oncology

## 2019-01-30 DIAGNOSIS — C50211 Malignant neoplasm of upper-inner quadrant of right female breast: Secondary | ICD-10-CM | POA: Diagnosis not present

## 2019-02-02 ENCOUNTER — Ambulatory Visit
Admission: RE | Admit: 2019-02-02 | Discharge: 2019-02-02 | Disposition: A | Discharge status: Home / Self Care | Payer: Medicare Other | Source: Ambulatory Visit | Attending: Radiation Oncology | Admitting: Radiation Oncology

## 2019-02-02 DIAGNOSIS — C50211 Malignant neoplasm of upper-inner quadrant of right female breast: Secondary | ICD-10-CM | POA: Insufficient documentation

## 2019-02-02 DIAGNOSIS — Z170 Estrogen receptor positive status [ER+]: Secondary | ICD-10-CM | POA: Diagnosis not present

## 2019-02-03 ENCOUNTER — Ambulatory Visit
Admission: RE | Admit: 2019-02-03 | Discharge: 2019-02-03 | Disposition: A | Discharge status: Home / Self Care | Payer: Medicare Other | Source: Ambulatory Visit | Attending: Radiation Oncology | Admitting: Radiation Oncology

## 2019-02-03 DIAGNOSIS — C50211 Malignant neoplasm of upper-inner quadrant of right female breast: Secondary | ICD-10-CM | POA: Diagnosis not present

## 2019-02-04 ENCOUNTER — Other Ambulatory Visit: Payer: Self-pay | Admitting: Internal Medicine

## 2019-02-04 NOTE — Telephone Encounter (Signed)
Patient is compliant with coumadin management, will refill X 6 mths.  

## 2019-02-12 DIAGNOSIS — Z7901 Long term (current) use of anticoagulants: Secondary | ICD-10-CM | POA: Diagnosis not present

## 2019-02-13 ENCOUNTER — Ambulatory Visit (INDEPENDENT_AMBULATORY_CARE_PROVIDER_SITE_OTHER): Payer: Medicare Other

## 2019-02-13 DIAGNOSIS — Z7901 Long term (current) use of anticoagulants: Secondary | ICD-10-CM | POA: Diagnosis not present

## 2019-02-13 LAB — POCT INR: INR: 1.8 — AB (ref 2.0–3.0)

## 2019-02-13 NOTE — Patient Instructions (Signed)
INR results:1.8  Spoke with Thurman Coyer, RN at [facility] on 02/13/19 to give the following instructions: Take 7.5mg  today (3/13) for a boost and then continue taking same dose 7.5 mg daily EXCEPT for 3.75mg  on Mon and 5mg  on Friday.  Recheck on 02/26/19.  Thurman Coyer, RN will discuss with patient and schedule recheck.   I also spoke with patient's husband to review dosage instructions.  He will convey to patient and call back if any questions or concerns.

## 2019-02-17 ENCOUNTER — Ambulatory Visit: Payer: Medicare Other | Admitting: Internal Medicine

## 2019-02-17 ENCOUNTER — Other Ambulatory Visit: Payer: Self-pay | Admitting: Internal Medicine

## 2019-02-17 MED ORDER — LISINOPRIL 10 MG PO TABS: 10.0000 mg | ORAL_TABLET | Freq: Every day | ORAL | 0 refills | Status: DC

## 2019-02-17 MED ORDER — HYDROCHLOROTHIAZIDE 12.5 MG PO CAPS: 12.5000 mg | ORAL_CAPSULE | Freq: Every day | ORAL | 0 refills | Status: DC

## 2019-02-17 MED ORDER — ATENOLOL 25 MG PO TABS: 25.0000 mg | ORAL_TABLET | Freq: Every day | ORAL | 0 refills | Status: DC

## 2019-02-26 ENCOUNTER — Ambulatory Visit (INDEPENDENT_AMBULATORY_CARE_PROVIDER_SITE_OTHER): Payer: Medicare Other

## 2019-02-26 DIAGNOSIS — Z7901 Long term (current) use of anticoagulants: Secondary | ICD-10-CM

## 2019-02-26 LAB — POCT INR: INR: 2.3 (ref 2.0–3.0)

## 2019-02-27 LAB — POCT INR: INR: 2.3 (ref 2.0–3.0)

## 2019-02-27 NOTE — Patient Instructions (Signed)
INR results:2.3  Instructions given to [name] nurse, [name] to communicate with patient: Continue taking same dose 7.5 mg daily EXCEPT for 3.75mg  on Mon and 5mg  on Friday.  Recheck on 03/26/19.  [name], RN will discuss with patient and schedule recheck.   Patient is doing well without any changes.

## 2019-02-28 ENCOUNTER — Other Ambulatory Visit: Payer: Self-pay | Admitting: Internal Medicine

## 2019-03-02 NOTE — Telephone Encounter (Signed)
Patient rx already filled on 02/04/19 for #70, 1 refill.    Unsure why being requested again.  This r/x request denied due to duplicate request.

## 2019-03-09 ENCOUNTER — Other Ambulatory Visit: Payer: Self-pay

## 2019-03-09 ENCOUNTER — Encounter: Payer: Self-pay | Admitting: Radiation Oncology

## 2019-03-09 ENCOUNTER — Ambulatory Visit
Admission: RE | Admit: 2019-03-09 | Discharge: 2019-03-09 | Disposition: A | Discharge status: Home / Self Care | Payer: Medicare Other | Source: Ambulatory Visit | Attending: Radiation Oncology | Admitting: Radiation Oncology

## 2019-03-09 VITALS — BP 166/95 | HR 77 | Temp 98.3°F | Resp 16 | Wt 145.4 lb

## 2019-03-09 DIAGNOSIS — Z923 Personal history of irradiation: Secondary | ICD-10-CM | POA: Insufficient documentation

## 2019-03-09 DIAGNOSIS — Z170 Estrogen receptor positive status [ER+]: Secondary | ICD-10-CM | POA: Insufficient documentation

## 2019-03-09 DIAGNOSIS — C50211 Malignant neoplasm of upper-inner quadrant of right female breast: Secondary | ICD-10-CM | POA: Diagnosis not present

## 2019-03-09 NOTE — Progress Notes (Signed)
Radiation Oncology Follow up Note  Name: Becky Farrell   Date:   03/09/2019 MRN:  [id number] DOB: 09-Sep-1934    This 85 y.o. female presents to the clinic today for 1 month follow-up status post whole breast radiation to her right breast for stage I invasive mammary carcinoma.  REFERRING PROVIDER: Jearld Fenton, NP  HPI: Patient is a 85 year old female now 1 month out of completed whole breast radiation to her right breast for stage I ER PR positive invasive mammary carcinoma..  She is seen today in routine follow-up and is doing well.  She specifically denies breast tenderness cough or bone pain.  We will meet with Dr. Mike Gip next week to discuss antiestrogen therapy.  COMPLICATIONS OF TREATMENT: none  FOLLOW UP COMPLIANCE: keeps appointments   PHYSICAL EXAM:  BP (!) 166/95 (BP Location: Left Arm, Patient Position: Sitting)   Pulse 77   Temp 98.3 F (36.8 C) (Tympanic)   Resp 16   Wt 145 lb 6.3 oz (65.9 kg)   BMI 25.76 kg/m  Lungs are clear to A&P cardiac examination essentially unremarkable with regular rate and rhythm. No dominant mass or nodularity is noted in either breast in 2 positions examined. Incision is well-healed. No axillary or supraclavicular adenopathy is appreciated. Cosmetic result is excellent.  Well-developed well-nourished patient in NAD. HEENT reveals PERLA, EOMI, discs not visualized.  Oral cavity is clear. No oral mucosal lesions are identified. Neck is clear without evidence of cervical or supraclavicular adenopathy. Lungs are clear to A&P. Cardiac examination is essentially unremarkable with regular rate and rhythm without murmur rub or thrill. Abdomen is benign with no organomegaly or masses noted. Motor sensory and DTR levels are equal and symmetric in the upper and lower extremities. Cranial nerves II through XII are grossly intact. Proprioception is intact. No peripheral adenopathy or edema is identified. No motor or sensory levels are noted. Crude visual  fields are within normal range.  RADIOLOGY RESULTS: No current films for review  PLAN: Present time patient is doing well 1 month out from whole breast radiation and pleased with her overall progress.  She will discuss with medical oncology next week antiestrogen therapy.  I have otherwise asked to see her back in 3 to 4 months for follow-up.  Then start a six-month follow-up and then once a year.  Patient comprehends my treatment plan well.  I would like to take this opportunity to thank you for allowing me to participate in the care of your patient.Noreene Filbert, MD

## 2019-03-16 ENCOUNTER — Other Ambulatory Visit: Payer: Self-pay

## 2019-03-16 DIAGNOSIS — Z7189 Other specified counseling: Secondary | ICD-10-CM | POA: Insufficient documentation

## 2019-03-16 NOTE — Progress Notes (Signed)
[facility]  [address], [address] [place], [place] [address] Phone: [phone]  Fax: [phone]   Telemedicine Office Visit:  03/17/2019  Referring physician: Jearld Fenton, NP  I connected with Becky Farrell on 03/17/19 at 9:18 AM EDT by videoconferencing and verified that I was speaking with the correct person using 2 identifiers.  The patient was at home.  I discussed the limitations, risk, security and privacy concerns of performing an evaluation and management service by videoconferencing and the availability of in person appointments.  I also discussed with the patient that there may be a patient responsible charge related to this service.  The patient expressed understanding and agreed to proceed.    Chief Complaint: Becky Farrell is a 85 y.o. female with stage I right breast cancer who is seen for assessment after interval lumpectomy.  HPI:  The patient was last seen in the medical oncology clinic on 11/07/2018.  At that time, she was doing well.  Exam revealed post-operative changes.  We discussed plan for radiation followed by endocrine therapy.  [facility] genetic testing was ordered.  She saw Dr. Donella Stade on 11/24/2018.  Whole breast radiation delivering 5040 cGy in 28 fractions to the right breast with a scar boost of another 1400 cGy using electron beam was discussed.  She began radiation on 12/16/2018.  Radiation completed on 02/03/2019.  During the interim, she is doing well.  She denies any breast concerns.  She has been taking vitamin D x 6 years.  She is not taking calcium.  Calcium has caused constipation in the past.  Regarding her osteoporosis, she is waiting to discuss it with [name], NP on 03/26/2019.  She states she needs to have a follow-up dental appointment.   Past Medical History:  Diagnosis Date  . Colon polyps   . Family history of breast cancer   . History of pulmonary embolism 2011  . Hypertension   . Melanoma  ([place]) 2003  . Seizures ([place]) 1993   no meds since (approx) 2014  . Vertigo    x1. R/T perforated ear drum prior to 2013    Past Surgical History:  Procedure Laterality Date  . ABDOMINAL HYSTERECTOMY  05/2014  . BREAST LUMPECTOMY WITH RADIOACTIVE SEED LOCALIZATION Right 10/10/2018   Procedure: BREAST LUMPECTOMY WITH RADIOACTIVE SEED LOCALIZATION;  Surgeon: Jovita Kussmaul, MD;  Location: [facility];  Service: General;  Laterality: Right;  . CATARACT EXTRACTION W/PHACO Right 01/06/2018   Procedure: CATARACT EXTRACTION PHACO AND INTRAOCULAR LENS PLACEMENT ([place]) RIGHT;  Surgeon: Leandrew Koyanagi, MD;  Location: [facility];  Service: Ophthalmology;  Laterality: Right;  . CATARACT EXTRACTION W/PHACO Left 01/29/2018   Procedure: CATARACT EXTRACTION PHACO AND INTRAOCULAR LENS PLACEMENT ([place]) LEFT;  Surgeon: Leandrew Koyanagi, MD;  Location: [facility];  Service: Ophthalmology;  Laterality: Left;  . left hand surgery  05/2011   s/p fall  . OTHER SURGICAL HISTORY  2003   Removal of lymphoma from melonoma 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     Family History  Problem Relation Age of Onset  . Heart disease Mother   . Aortic aneurysm Father   . Breast cancer Daughter 42  . Breast cancer Niece 76    Social History:  reports that she has never smoked. She has never used smokeless tobacco. She reports that she does not drink alcohol or use drugs.  The patient recently moved from [place] to [place].  Her daughter and grandchildren live in [place].  She has other children in [place].  Her husband has been recovering from prostate cancer.  She is a retired Pharmacist, hospital.   Participants in the patient's visit and their role in the encounter included the patient, her husband Medical sales representative), and Vito Berger, CMA, today.  The intake visit was provided by Vito Berger, CMA.    Allergies:  Allergies  Allergen Reactions  . Clobetasol Other (See Comments)    vertigo  . Clobetasol Propionate Other  (See Comments)    This is ointment form UNSPECIFIED REACTION   . Phenergan [Promethazine Hcl] Other (See Comments)    Rapid heart rate  . Dextromethorphan Palpitations and Other (See Comments)    promethazine with DM, rapid hearbeat  . Zithromax [Azithromycin] Palpitations and Other (See Comments)    Per OSH report, pt unable to recall Rapid heart rate    Current Medications: Current Outpatient Medications  Medication Sig Dispense Refill  . aspirin 81 MG tablet Take 81 mg by mouth daily.    Marland Kitchen atenolol (TENORMIN) 25 MG tablet Take 1 tablet (25 mg total) by mouth daily. 90 tablet 0  . Cholecalciferol (VITAMIN D3) 2000 UNITS TABS Take 2,000 Units by mouth daily.     . hydrochlorothiazide (MICROZIDE) 12.5 MG capsule Take 1 capsule (12.5 mg total) by mouth daily. 90 capsule 0  . lisinopril (PRINIVIL,ZESTRIL) 10 MG tablet Take 1 tablet (10 mg total) by mouth daily. 90 tablet 0  . polyethylene glycol (MIRALAX / GLYCOLAX) packet Take 17 g by mouth every other day.    . warfarin (COUMADIN) 5 MG tablet TAKE 1 TAB BY MOUTH DAILY ON FRIDAYS, 1/3 TABS ON MONDAYS, 1.5 TABS DAILY ON ALL OTHER DAYS. 90 tablet 0  . warfarin (COUMADIN) 7.5 MG tablet TAKE 1 TABLET EVERY DAY AND AS DIRECTED 70 tablet 1  . Wheat Dextrin (BENEFIBER PO) Take 2 each by mouth See admin instructions. Mix 2 teaspoons and drink 3 times daily    . HYDROcodone-acetaminophen (NORCO/VICODIN) 5-325 MG tablet Take 1-2 tablets by mouth every 6 (six) hours as needed for moderate pain or severe pain. (Patient not taking: Reported on 03/17/2019) 15 tablet 0  . Iodoquinol-HC-Aloe Polysacch (ALCORTIN A) 1-2-1 % GEL Apply topically as needed (for rash).     No current facility-administered medications for this visit.     Review of Systems  Constitutional: Negative.  Negative for chills, diaphoresis, fever, malaise/fatigue and weight loss.       Feels "good".  HENT: Positive for hearing loss. Negative for congestion, ear discharge, ear pain,  nosebleeds, sinus pain and sore throat.        Needs dental appointment.  Eyes: Negative.  Negative for blurred vision, double vision and photophobia.  Respiratory: Negative.  Negative for cough, hemoptysis, sputum production and shortness of breath.        Recurrent PE x 2.  Cardiovascular: Negative.  Negative for chest pain, palpitations, orthopnea, leg swelling and PND.  Gastrointestinal: Negative for abdominal pain, blood in stool, constipation, diarrhea, heartburn, melena, nausea and vomiting.  Genitourinary: Negative.  Negative for dysuria, frequency, hematuria and urgency.  Musculoskeletal: Negative for back pain, falls, joint pain, myalgias and neck pain.       Osteoporosis.  Skin: Negative.  Negative for itching and rash.  Neurological: Negative for dizziness, tremors, sensory change, speech change, focal weakness, seizures (history), weakness and headaches.       Chronic balance issues.  Endo/Heme/Allergies: Bruises/bleeds easily (on Coumadin).  Psychiatric/Behavioral: Negative.  Negative for depression and memory loss.  The patient is not nervous/anxious and does not have insomnia.   All other systems reviewed and are negative.  Performance status (ECOG): 1  Vital Signs There were no vitals taken for this visit.  Physical Exam  Constitutional: She is oriented to person, place, and time.  Well developed, well nourished elderly woman sitting comfortably at home in no acute distress.    HENT:  Head: Normocephalic and atraumatic.  Short gray/white hair.  Eyes: Conjunctivae and EOM are normal. No scleral icterus.  Glasses. Blue eyes.   Neurological: She is alert and oriented to person, place, and time.  Skin: She is not diaphoretic.  Psychiatric: Mood, affect and judgment normal.    No visits with results within 3 Day(s) from this visit.  Latest known visit with results is:  Anti-coag visit on 02/26/2019  Component Date Value Ref Range Status  . INR 02/27/2019 2.3  2.0 -  3.0 Final  . INR 02/26/2019 2.3  2.0 - 3.0 Final    Assessment:  Becky Farrell is a 85 y.o. female with stage T1aNx right breast cancer s/p lumpectomy on 10/10/2018. Pathology revealed a 0.4 cm grade I invasive ductal carcinoma with mucinous features.  There was intermediate grade DCIS.  Margins were uninvolved (1.0 cm posterior margin).  There were fibrocystic changes including apocrine metaplasia.  Tumor was ER + (100%), PR + (90%), and Her2/neu-.  Ki67 was 10%.  Pathologic stage was T1aNx.  CA27.29 was 14.1 on 09/08/2018.  Bilateral screening mammogram on 07/31/2018 revealed a possible mass in the right breast.  Right diagnostic mammogram and targeted ultrasound on 08/13/2018 revealed a 4 x 5 x 8 mm indeterminate mass at the 10 o'clock position 9 cm from the nipple in the right breast.  She received radiation from 12/16/2018 - 02/03/2019.  She received 5040 cGy in 28 fractions with a scar boost of another 1400 cGy using electron beam.  Bone density on 07/31/2018 revealed osteoporosis with a T-score of -3.4 in the right forearm radius and -1.9 in the right femoral neck.  She has a history of pulmonary embolism x 2.  Initial event occurred in 2011.  She had a recurrent pulmonary embolism after holding Coumadin for 1 month for a planned colonoscopy.  She is on life long anticoagulation (Coumadin).  She has a family history of breast cancer (daughter, age 18, and niece).  [employer] genetic testing was negative.  Symptomatically, she is doing well.  Plan: 1.   Labs today:  CBC with diff, CMP, CA27.29. 2.   Stage T1aNx right breast cancer She is s/p surgery and radiation. Discuss plan for endocrine therapy post radiation.  Discus tamoxifen versus aromatase inhibitors.    Potential side effects we reviewed. Discuss consideration of tamoxifen for 5 years or tamoxifen for 2-3 years followed by an aromatase inhibitor (AI) x 2-3 years to complete 5 years.  Discuss no bone loss associated with  tamoxifen.  Discuss increased efficacy with an aromatase inhibitor (AI).  Discuss switching from tamoxifen to an AI after 2-3 years and achieving the same benefit as if she had been on an AI for 5 years.  Patient would like to discuss with [name], NP prior to making a decision. 3.   Osteoporosis Discuss need for calcium 1200 mg/day and vitamin D. Patient has been on vitamin D for 6 years. Patient hesistant about oral calcium secondary to constipation Discuss consideration of Prolia and need for dental clearance. Review prior plan for initiation of tamoxifen secondary to osteoporosis  associated with these inhibitors. 4.   Family history of breast cancer:  Breast cancer in daughter, age 62, and a niece.  [employer]ae genetic testing was negative. 5.   Follow-up with [name], NP as scheduled on 03/30/2019. 6.   RTC in 1 month for MD assessment, labs (CBC with diff, CMP, CA 27.29), and initiation of endocrine therapy.   I discussed the assessment and treatment plan with the patient.  The patient was provided an opportunity to ask questions and all were answered.  The patient agreed with the plan and demonstrated an understanding of the instructions.  The patient was advised to call back or seek an in person evaluation if the symptoms worsen or if the condition fails to improve as anticipated.  I provided 25 minutes (09:18 AM - 09:43 AM) of face-to-face video visit time during this this encounter and > 50% was spent counseling as documented under my assessment and plan.  I provided these services from the [facility] office.   Lequita Asal, MD, PhD  03/17/2019, 9:18 AM

## 2019-03-17 ENCOUNTER — Inpatient Hospital Stay: Payer: Medicare Other | Attending: Hematology and Oncology | Admitting: Hematology and Oncology

## 2019-03-17 ENCOUNTER — Encounter: Payer: Self-pay | Admitting: Hematology and Oncology

## 2019-03-17 DIAGNOSIS — C50211 Malignant neoplasm of upper-inner quadrant of right female breast: Secondary | ICD-10-CM | POA: Diagnosis not present

## 2019-03-17 DIAGNOSIS — Z170 Estrogen receptor positive status [ER+]: Secondary | ICD-10-CM

## 2019-03-17 DIAGNOSIS — Z7189 Other specified counseling: Secondary | ICD-10-CM | POA: Diagnosis not present

## 2019-03-17 DIAGNOSIS — M810 Age-related osteoporosis without current pathological fracture: Secondary | ICD-10-CM | POA: Diagnosis not present

## 2019-03-17 NOTE — Progress Notes (Signed)
No new changes noted today. The patient DOB, Name and address has been verified by phone today.

## 2019-03-18 ENCOUNTER — Telehealth: Payer: Self-pay | Admitting: *Deleted

## 2019-03-18 NOTE — Telephone Encounter (Signed)
Patient left a voicemail stating that she has an appointment scheduled with [name] NP on 03/30/19 at 3:45. Patient wants to know if this will be an in office visit or should it be a web visit?

## 2019-03-20 NOTE — Telephone Encounter (Signed)
I spoke with pt, she is aware this is a doxy.me

## 2019-03-26 ENCOUNTER — Telehealth: Payer: Self-pay

## 2019-03-26 DIAGNOSIS — Z7901 Long term (current) use of anticoagulants: Secondary | ICD-10-CM | POA: Diagnosis not present

## 2019-03-26 LAB — POCT INR: INR: 1.9 — AB (ref 2.0–3.0)

## 2019-03-26 NOTE — Telephone Encounter (Signed)
Pt left v/m that she has appt on 03/30/19 with R Baity NP; pt thought visit was Doxy.me (virtual visit). Pt received automated call on 03/25/19 at 5:30 pm that pt had appt on 03/30/19 but it was an in office appt. Pt request cb with clarification if pt is to come to office or do virtual appt.Please advise.

## 2019-03-27 ENCOUNTER — Other Ambulatory Visit: Payer: Self-pay | Admitting: Internal Medicine

## 2019-03-30 ENCOUNTER — Telehealth: Payer: Self-pay | Admitting: Internal Medicine

## 2019-03-30 ENCOUNTER — Other Ambulatory Visit: Payer: Self-pay

## 2019-03-30 ENCOUNTER — Ambulatory Visit: Payer: Self-pay

## 2019-03-30 ENCOUNTER — Telehealth: Payer: Self-pay

## 2019-03-30 ENCOUNTER — Ambulatory Visit (INDEPENDENT_AMBULATORY_CARE_PROVIDER_SITE_OTHER): Payer: Medicare Other | Admitting: Internal Medicine

## 2019-03-30 ENCOUNTER — Encounter: Payer: Self-pay | Admitting: Internal Medicine

## 2019-03-30 DIAGNOSIS — Z0000 Encounter for general adult medical examination without abnormal findings: Secondary | ICD-10-CM

## 2019-03-30 DIAGNOSIS — Z86711 Personal history of pulmonary embolism: Secondary | ICD-10-CM

## 2019-03-30 DIAGNOSIS — C50211 Malignant neoplasm of upper-inner quadrant of right female breast: Secondary | ICD-10-CM | POA: Diagnosis not present

## 2019-03-30 DIAGNOSIS — I10 Essential (primary) hypertension: Secondary | ICD-10-CM | POA: Diagnosis not present

## 2019-03-30 DIAGNOSIS — M810 Age-related osteoporosis without current pathological fracture: Secondary | ICD-10-CM

## 2019-03-30 DIAGNOSIS — Z170 Estrogen receptor positive status [ER+]: Secondary | ICD-10-CM | POA: Diagnosis not present

## 2019-03-30 DIAGNOSIS — Z7901 Long term (current) use of anticoagulants: Secondary | ICD-10-CM

## 2019-03-30 DIAGNOSIS — G459 Transient cerebral ischemic attack, unspecified: Secondary | ICD-10-CM

## 2019-03-30 DIAGNOSIS — Z86718 Personal history of other venous thrombosis and embolism: Secondary | ICD-10-CM | POA: Diagnosis not present

## 2019-03-30 DIAGNOSIS — Z8673 Personal history of transient ischemic attack (TIA), and cerebral infarction without residual deficits: Secondary | ICD-10-CM | POA: Diagnosis not present

## 2019-03-30 NOTE — Assessment & Plan Note (Signed)
Encouraged 30 minutes of weight bearing exercise daily Will have her make lab only appt for Vit D Continue Calicium and Vit D OTC

## 2019-03-30 NOTE — Assessment & Plan Note (Signed)
Getting radiation She will continue to follow with oncology

## 2019-03-30 NOTE — Progress Notes (Signed)
Virtual Visit via Video Note  I connected with Becky Farrell on 03/30/19 at  3:45 PM EDT by a video enabled telemedicine application and verified that I am speaking with the correct person using two identifiers.   I discussed the limitations of evaluation and management by telemedicine and the availability of in person appointments. The patient expressed understanding and agreed to proceed.  Patient Location: Home Provider Location: Office  History of Present Illness:  Pt due for Medicare Wellness  Exam. She is also due to follow up chronic conditions.  Right Breast Cancer: s/p lumpectomy and radioactive seed implantation. She follows with oncology.  History of TIA: No residual effects. She is taking Atenolol, HCTZ, Lisinopril and ASA as prescribed. She does not follow with neurology.   Seizures: Remote history. No recent reoccurrence.  History of DVT/PE: On lifelong Coumadin. [facility].  HTN: She does not monitor her BP at home. She is taking Atenolol, HCTZ and Lisinopril as prescribed.  Osteoporosis: She tries to get 30 minutes of weight bearing exercise daily. She is taking Calcium and Vit D OTC.  Past Medical History:  Diagnosis Date  . Colon polyps   . Family history of breast cancer   . History of pulmonary embolism 2011  . Hx of melanoma of skin 2003   Right leg  . Hypertension   . Melanoma ([place]) 2003  . Seizures ([place]) 1993   no meds since (approx) 2014  . Vertigo    x1. R/T perforated ear drum prior to 2013    Current Outpatient Medications  Medication Sig Dispense Refill  . aspirin 81 MG tablet Take 81 mg by mouth daily.    Marland Kitchen atenolol (TENORMIN) 25 MG tablet Take 1 tablet (25 mg total) by mouth daily. 90 tablet 0  . Cholecalciferol (VITAMIN D3) 2000 UNITS TABS Take 2,000 Units by mouth daily.     . hydrochlorothiazide (MICROZIDE) 12.5 MG capsule Take 1 capsule (12.5 mg total) by mouth daily. 90 capsule 0  .  HYDROcodone-acetaminophen (NORCO/VICODIN) 5-325 MG tablet Take 1-2 tablets by mouth every 6 (six) hours as needed for moderate pain or severe pain. (Patient not taking: Reported on 03/17/2019) 15 tablet 0  . Iodoquinol-HC-Aloe Polysacch (ALCORTIN A) 1-2-1 % GEL Apply topically as needed (for rash).    Marland Kitchen lisinopril (PRINIVIL,ZESTRIL) 10 MG tablet Take 1 tablet (10 mg total) by mouth daily. 90 tablet 0  . polyethylene glycol (MIRALAX / GLYCOLAX) packet Take 17 g by mouth every other day.    . warfarin (COUMADIN) 5 MG tablet TAKE 1 TAB BY MOUTH DAILY ON FRIDAYS, 1/3 TABS ON MONDAYS, 1.5 TABS DAILY ON ALL OTHER DAYS. 90 tablet 0  . warfarin (COUMADIN) 7.5 MG tablet TAKE 1 TABLET EVERY DAY AND AS DIRECTED 70 tablet 1  . Wheat Dextrin (BENEFIBER PO) Take 2 each by mouth See admin instructions. Mix 2 teaspoons and drink 3 times daily     No current facility-administered medications for this visit.     Allergies  Allergen Reactions  . Clobetasol Other (See Comments)    vertigo  . Clobetasol Propionate Other (See Comments)    This is ointment form UNSPECIFIED REACTION   . Phenergan [Promethazine Hcl] Other (See Comments)    Rapid heart rate  . Dextromethorphan Palpitations and Other (See Comments)    promethazine with DM, rapid hearbeat  . Zithromax [Azithromycin] Palpitations and Other (See Comments)    Per OSH report, pt unable to recall Rapid  heart rate    Family History  Problem Relation Age of Onset  . Heart disease Mother   . Aortic aneurysm Father   . Breast cancer Daughter 21  . Breast cancer Niece 53    Social History   Socioeconomic History  . Marital status: Married    Spouse name: Not on file  . Number of children: Not on file  . Years of education: Not on file  . Highest education level: Not on file  Occupational History  . Not on file  Social Needs  . Financial resource strain: Not on file  . Food insecurity:    Worry: Not on file    Inability: Not on file  .  Transportation needs:    Medical: Not on file    Non-medical: Not on file  Tobacco Use  . Smoking status: Never Smoker  . Smokeless tobacco: Never Used  Substance and Sexual Activity  . Alcohol use: No    Frequency: Never  . Drug use: Never  . Sexual activity: Never  Lifestyle  . Physical activity:    Days per week: Not on file    Minutes per session: Not on file  . Stress: Not on file  Relationships  . Social connections:    Talks on phone: Not on file    Gets together: Not on file    Attends religious service: Not on file    Active member of club or organization: Not on file    Attends meetings of clubs or organizations: Not on file    Relationship status: Not on file  . Intimate partner violence:    Fear of current or ex partner: Not on file    Emotionally abused: Not on file    Physically abused: Not on file    Forced sexual activity: Not on file  Other Topics Concern  . Not on file  Social History Narrative   Recently moved from [place] to [facility].   Daughter and grandchildren live in [place].   Has other children in [place].   Retired Pharmacist, hospital.      Husband has been recovering from Prostate CA.    Hospitiliaztions: None  Health Maintenance:    Flu: 08/2018  Tetanus: 05/2011  Pneumovax: 12/2000  Prevnar: 12/05/2016  Zostavax: 09/2012  Mammogram: 07/2018  Pap Smear: no longer screening  Bone Density: 07/2018  Colon Screening: no longer screening  Eye Doctor: annually  Dental Exam: biannually   Providers:   PCP: [name], NP-C  Oncologist: Dr. Mike Gip  Radiation Oncologist: Dr. Baruch Gouty  Podiatry: Dr. Prudence Davidson  Ophthalmology: Dr. Wallace Going   I have personally reviewed and have noted:  1. The patient's medical and social history 2. Their use of alcohol, tobacco or illicit drugs 3. Their current medications and supplements 4. The patient's functional ability including ADL's, fall risks, home safety risks and hearing or visual impairment. 5. Diet  and physical activities 6. Evidence for depression or mood disorder  Subjective:   Review of Systems:   Constitutional: Denies fever, malaise, fatigue, headache or abrupt weight changes.  HEENT: Denies eye pain, eye redness, ear pain, ringing in the ears, wax buildup, runny nose, nasal congestion, bloody nose, or sore throat. Respiratory: Denies difficulty breathing, shortness of breath, cough or sputum production.   Cardiovascular: Denies chest pain, chest tightness, palpitations or swelling in the hands or feet.  Gastrointestinal: Pt reports intermittent constipation. Denies abdominal pain, bloating, diarrhea or blood in the stool.  GU: Denies urgency, frequency, pain  with urination, burning sensation, blood in urine, odor or discharge. Musculoskeletal: Pt reports mild LLE weakness. Denies decrease in range of motion, difficulty with gait, muscle pain or joint pain and swelling.  Skin: Denies redness, rashes, lesions or ulcercations.  Neurological: Denies dizziness, difficulty with memory, difficulty with speech or problems with balance and coordination.  Psych: Denies anxiety, depression, SI/HI.  No other specific complaints in a complete review of systems (except as listed in HPI above).  Objective:  PE:    Wt Readings from Last 3 Encounters:  03/09/19 145 lb 6.3 oz (65.9 kg)  11/24/18 139 lb 14.1 oz (63.5 kg)  11/07/18 144 lb (65.3 kg)    General: Appears her stated age, well developed, well nourished in NAD. HEENT: Head: normal shape and size; Eyes: sclera white, no icterus, and EOMs intact;  Pulmonary/Chest: Normal effort. No respiratory distress.   Neurological: Alert and oriented.  Psychiatric: Mood and affect normal. Behavior is normal. Judgment and thought content normal.     BMET    Component Value Date/Time   NA 137 10/02/2018 1336   NA 140 04/22/2013 0237   K 3.8 10/02/2018 1336   K 3.6 04/22/2013 0237   CL 103 10/02/2018 1336   CL 106 04/22/2013 0237    CO2 31 10/02/2018 1336   CO2 29 04/22/2013 0237   GLUCOSE 92 10/02/2018 1336   GLUCOSE 88 04/22/2013 0237   BUN 17 10/02/2018 1336   BUN 26 (H) 04/22/2013 0237   CREATININE 0.81 10/02/2018 1336   CREATININE 0.74 01/01/2014 1556   CALCIUM 9.7 10/02/2018 1336   CALCIUM 9.7 04/22/2013 0237   GFRNONAA >60 10/02/2018 1336   GFRNONAA >60 04/22/2013 0237   GFRAA >60 10/02/2018 1336   GFRAA >60 04/22/2013 0237    Lipid Panel     Component Value Date/Time   CHOL 208 (H) 12/09/2017 1007   TRIG 64.0 12/09/2017 1007   HDL 73.20 12/09/2017 1007   CHOLHDL 3 12/09/2017 1007   VLDL 12.8 12/09/2017 1007   LDLCALC 122 (H) 12/09/2017 1007    CBC    Component Value Date/Time   WBC 4.3 01/28/2019 1251   RBC 4.12 01/28/2019 1251   HGB 13.3 01/28/2019 1251   HGB 13.8 05/05/2014 1218   HCT 38.8 01/28/2019 1251   HCT 36.4 05/12/2014 0628   PLT 163 01/28/2019 1251   PLT 163 05/05/2014 1218   MCV 94.2 01/28/2019 1251   MCV 97 05/05/2014 1218   MCH 32.3 01/28/2019 1251   MCHC 34.3 01/28/2019 1251   RDW 12.2 01/28/2019 1251   RDW 13.6 05/05/2014 1218   LYMPHSABS 0.8 (L) 09/08/2018 1047   MONOABS 0.6 09/08/2018 1047   EOSABS 0.1 09/08/2018 1047   BASOSABS 0.0 09/08/2018 1047    Hgb A1C Lab Results  Component Value Date   HGBA1C 5.4 07/07/2017      Assessment and Plan:   Medicare Annual Wellness Visit:  Diet: She does eat meat. She consumes fruits and veggies daily. She rarely eats fried foods. She drinks mostly water, milk and lemondae Physical activity:  Depression/mood screen: Negative Hearing: Intact to whispered voice Visual acuity: Grossly normal, performs annual eye exam  ADLs: Capable Fall risk: None Home safety: Good Cognitive evaluation: Intact to orientation, naming, recall and repetition EOL planning: Adv directives, DNR/ I agree  Preventative Medicine:  Flu, tetanus, pneumovax, prevnar and zostovax UTD. Mammogram UTD. She no longer needs pap smears, colon  cancer screening. Bone density UTD. Encouraged her to consume a  balanced diet and exercise regimen. Advised her to see an eye doctor and dentist annually. Will check CBC, CEMT, Lipid and Vit D today.  Next appointment: 1 year, Medicare Wellness Exam  Follow Up Instructions:    I discussed the assessment and treatment plan with the patient. The patient was provided an opportunity to ask questions and all were answered. The patient agreed with the plan and demonstrated an understanding of the instructions.   The patient was advised to call back or seek an in-person evaluation if the symptoms worsen or if the condition fails to improve as anticipated.     [name], NP

## 2019-03-30 NOTE — Patient Instructions (Signed)
INR lab obtainined 4/23/ results received by coumadin nurse on 03/29/09:  1.9  Instructions given to [place], [name] to communicate with patient: Take 5mg  today (4/27) for a boost and then continue taking same dose 7.5 mg daily EXCEPT for 3.75mg  on Mon and 5mg  on Friday.  Recheck on 04/09/19.  [name], RN will discuss with patient and schedule recheck.

## 2019-03-30 NOTE — Patient Instructions (Signed)
Fat and Cholesterol Restricted Eating Plan Getting too much fat and cholesterol in your diet may cause health problems. Choosing the right foods helps keep your fat and cholesterol at normal levels. This can keep you from getting certain diseases. Your doctor may recommend an eating plan that includes:  Total fat: ______% or less of total calories a day.  Saturated fat: ______% or less of total calories a day.  Cholesterol: less than _________mg a day.  Fiber: ______g a day. What are tips for following this plan? Meal planning  At meals, divide your plate into four equal parts: ? Fill one-half of your plate with vegetables and green salads. ? Fill one-fourth of your plate with whole grains. ? Fill one-fourth of your plate with low-fat (lean) protein foods.  Eat fish that is high in omega-3 fats at least two times a week. This includes mackerel, tuna, sardines, and salmon.  Eat foods that are high in fiber, such as whole grains, beans, apples, broccoli, carrots, peas, and barley. General tips   Work with your doctor to lose weight if you need to.  Avoid: ? Foods with added sugar. ? Fried foods. ? Foods with partially hydrogenated oils.  Limit alcohol intake to no more than 1 drink a day for nonpregnant women and 2 drinks a day for men. One drink equals 12 oz of beer, 5 oz of wine, or 1 oz of hard liquor. Reading food labels  Check food labels for: ? Trans fats. ? Partially hydrogenated oils. ? Saturated fat (g) in each serving. ? Cholesterol (mg) in each serving. ? Fiber (g) in each serving.  Choose foods with healthy fats, such as: ? Monounsaturated fats. ? Polyunsaturated fats. ? Omega-3 fats.  Choose grain products that have whole grains. Look for the word "whole" as the first word in the ingredient list. Cooking  Cook foods using low-fat methods. These include baking, boiling, grilling, and broiling.  Eat more home-cooked foods. Eat at restaurants and buffets  less often.  Avoid cooking using saturated fats, such as butter, cream, palm oil, palm kernel oil, and coconut oil. Recommended foods  Fruits  All fresh, canned (in natural juice), or frozen fruits. Vegetables  Fresh or frozen vegetables (raw, steamed, roasted, or grilled). Green salads. Grains  Whole grains, such as whole wheat or whole grain breads, crackers, cereals, and pasta. Unsweetened oatmeal, bulgur, barley, quinoa, or brown rice. Corn or whole wheat flour tortillas. Meats and other protein foods  Ground beef (85% or leaner), grass-fed beef, or beef trimmed of fat. Skinless chicken or turkey. Ground chicken or turkey. Pork trimmed of fat. All fish and seafood. Egg whites. Dried beans, peas, or lentils. Unsalted nuts or seeds. Unsalted canned beans. Nut butters without added sugar or oil. Dairy  Low-fat or nonfat dairy products, such as skim or 1% milk, 2% or reduced-fat cheeses, low-fat and fat-free ricotta or cottage cheese, or plain low-fat and nonfat yogurt. Fats and oils  Tub margarine without trans fats. Light or reduced-fat mayonnaise and salad dressings. Avocado. Olive, canola, sesame, or safflower oils. The items listed above may not be a complete list of foods and beverages you can eat. Contact a dietitian for more information. Foods to avoid Fruits  Canned fruit in heavy syrup. Fruit in cream or butter sauce. Fried fruit. Vegetables  Vegetables cooked in cheese, cream, or butter sauce. Fried vegetables. Grains  White bread. White pasta. White rice. Cornbread. Bagels, pastries, and croissants. Crackers and snack foods that contain trans fat   and hydrogenated oils. Meats and other protein foods  Fatty cuts of meat. Ribs, chicken wings, bacon, sausage, bologna, salami, chitterlings, fatback, hot dogs, bratwurst, and packaged lunch meats. Liver and organ meats. Whole eggs and egg yolks. Chicken and turkey with skin. Fried meat. Dairy  Whole or 2% milk, cream,  half-and-half, and cream cheese. Whole milk cheeses. Whole-fat or sweetened yogurt. Full-fat cheeses. Nondairy creamers and whipped toppings. Processed cheese, cheese spreads, and cheese curds. Beverages  Alcohol. Sugar-sweetened drinks such as sodas, lemonade, and fruit drinks. Fats and oils  Butter, stick margarine, lard, shortening, ghee, or bacon fat. Coconut, palm kernel, and palm oils. Sweets and desserts  Corn syrup, sugars, honey, and molasses. Candy. Jam and jelly. Syrup. Sweetened cereals. Cookies, pies, cakes, donuts, muffins, and ice cream. The items listed above may not be a complete list of foods and beverages you should avoid. Contact a dietitian for more information. Summary  Choosing the right foods helps keep your fat and cholesterol at normal levels. This can keep you from getting certain diseases.  At meals, fill one-half of your plate with vegetables and green salads.  Eat high-fiber foods, like whole grains, beans, apples, carrots, peas, and barley.  Limit added sugar, saturated fats, alcohol, and fried foods. This information is not intended to replace advice given to you by your health care provider. Make sure you discuss any questions you have with your health care provider. Document Released: 05/20/2012 Document Revised: 07/23/2018 Document Reviewed: 08/06/2017 Elsevier Interactive Patient Education  2019 Elsevier Inc.   

## 2019-03-30 NOTE — Assessment & Plan Note (Signed)
Controlled on Atenolol, Lisinopril and HCTZ Reinforced DASH diet Will have her make nurse visit for BP check Will have her make lab only appt for CMET Will monitor

## 2019-03-30 NOTE — Telephone Encounter (Signed)
INR done 03/26/2019  INR 1.9 PT 18.5  Note stating pt is taking 3.75 Mon, 5mg , Fri, 7.5 all other days  Fax results back to [phone]

## 2019-03-30 NOTE — Assessment & Plan Note (Signed)
Lifelong Coumadin Continue to follow with Coumadin clinic

## 2019-03-30 NOTE — Assessment & Plan Note (Signed)
No residual effect Continue Atenolol, Lisinopril, HCTZ and ASA Will have her schedule lab only appt for CBC, CMET and Lipid

## 2019-03-30 NOTE — Telephone Encounter (Signed)
Please refer to coag encounter dated 03/30/19 for management details.    Thanks.

## 2019-03-30 NOTE — Telephone Encounter (Signed)
Pt schedule labs and nurse visit bp check 4/28 @ 2:15

## 2019-03-30 NOTE — Telephone Encounter (Signed)
I spoke with pt, she is aware appt is virtual and not in the office.

## 2019-03-31 ENCOUNTER — Other Ambulatory Visit (INDEPENDENT_AMBULATORY_CARE_PROVIDER_SITE_OTHER): Payer: Medicare Other

## 2019-03-31 ENCOUNTER — Ambulatory Visit: Payer: Medicare Other

## 2019-03-31 DIAGNOSIS — Z0000 Encounter for general adult medical examination without abnormal findings: Secondary | ICD-10-CM | POA: Diagnosis not present

## 2019-03-31 LAB — COMPREHENSIVE METABOLIC PANEL
ALT: 14 U/L (ref 0–35)
AST: 19 U/L (ref 0–37)
Albumin: 4.1 g/dL (ref 3.5–5.2)
Alkaline Phosphatase: 72 U/L (ref 39–117)
BUN: 24 mg/dL — ABNORMAL HIGH (ref 6–23)
CO2: 29 mEq/L (ref 19–32)
Calcium: 9.8 mg/dL (ref 8.4–10.5)
Chloride: 102 mEq/L (ref 96–112)
Creatinine, Ser: 0.8 mg/dL (ref 0.40–1.20)
GFR: 68.56 mL/min (ref >60.00)
Glucose, Bld: 88 mg/dL (ref 70–99)
Potassium: 4 mEq/L (ref 3.5–5.1)
Sodium: 138 mEq/L (ref 135–145)
Total Bilirubin: 0.6 mg/dL (ref 0.2–1.2)
Total Protein: 6.9 g/dL (ref 6.0–8.3)

## 2019-03-31 LAB — CBC
HCT: 39.2 % (ref 36.0–46.0)
Hemoglobin: 13.3 g/dL (ref 12.0–15.0)
MCHC: 34.1 g/dL (ref 30.0–36.0)
MCV: 95.5 fl (ref 78.0–100.0)
Platelets: 176 10*3/uL (ref 150.0–400.0)
RBC: 4.1 Mil/uL (ref 3.87–5.11)
RDW: 13.2 % (ref 11.5–15.5)
WBC: 5.2 10*3/uL (ref 4.0–10.5)

## 2019-03-31 LAB — LIPID PANEL
Cholesterol: 225 mg/dL — ABNORMAL HIGH (ref 0–200)
HDL: 74.6 mg/dL (ref >39.00)
LDL Cholesterol: 135 mg/dL — ABNORMAL HIGH (ref 0–99)
NonHDL: 150.43
Total CHOL/HDL Ratio: 3
Triglycerides: 79 mg/dL (ref 0.0–149.0)
VLDL: 15.8 mg/dL (ref 0.0–40.0)

## 2019-03-31 LAB — VITAMIN D 25 HYDROXY (VIT D DEFICIENCY, FRACTURES): VITD: 54.11 ng/mL (ref 30.00–100.00)

## 2019-04-09 ENCOUNTER — Ambulatory Visit: Payer: Medicare Other | Admitting: Podiatry

## 2019-04-09 ENCOUNTER — Other Ambulatory Visit: Payer: Self-pay

## 2019-04-09 ENCOUNTER — Inpatient Hospital Stay: Payer: Medicare Other | Attending: Hematology and Oncology

## 2019-04-09 DIAGNOSIS — Z79811 Long term (current) use of aromatase inhibitors: Secondary | ICD-10-CM | POA: Diagnosis not present

## 2019-04-09 DIAGNOSIS — Z79899 Other long term (current) drug therapy: Secondary | ICD-10-CM | POA: Diagnosis not present

## 2019-04-09 DIAGNOSIS — M810 Age-related osteoporosis without current pathological fracture: Secondary | ICD-10-CM | POA: Insufficient documentation

## 2019-04-09 DIAGNOSIS — Z86711 Personal history of pulmonary embolism: Secondary | ICD-10-CM | POA: Diagnosis not present

## 2019-04-09 DIAGNOSIS — Z881 Allergy status to other antibiotic agents status: Secondary | ICD-10-CM | POA: Diagnosis not present

## 2019-04-09 DIAGNOSIS — Z8249 Family history of ischemic heart disease and other diseases of the circulatory system: Secondary | ICD-10-CM | POA: Diagnosis not present

## 2019-04-09 DIAGNOSIS — H9190 Unspecified hearing loss, unspecified ear: Secondary | ICD-10-CM | POA: Insufficient documentation

## 2019-04-09 DIAGNOSIS — Z888 Allergy status to other drugs, medicaments and biological substances status: Secondary | ICD-10-CM | POA: Insufficient documentation

## 2019-04-09 DIAGNOSIS — C50211 Malignant neoplasm of upper-inner quadrant of right female breast: Secondary | ICD-10-CM | POA: Insufficient documentation

## 2019-04-09 DIAGNOSIS — Z803 Family history of malignant neoplasm of breast: Secondary | ICD-10-CM | POA: Insufficient documentation

## 2019-04-09 DIAGNOSIS — Z923 Personal history of irradiation: Secondary | ICD-10-CM | POA: Insufficient documentation

## 2019-04-09 DIAGNOSIS — Z170 Estrogen receptor positive status [ER+]: Secondary | ICD-10-CM | POA: Insufficient documentation

## 2019-04-09 DIAGNOSIS — Z86010 Personal history of colonic polyps: Secondary | ICD-10-CM | POA: Insufficient documentation

## 2019-04-09 DIAGNOSIS — Z85820 Personal history of malignant melanoma of skin: Secondary | ICD-10-CM | POA: Insufficient documentation

## 2019-04-09 DIAGNOSIS — Z7901 Long term (current) use of anticoagulants: Secondary | ICD-10-CM | POA: Insufficient documentation

## 2019-04-09 LAB — CBC WITH DIFFERENTIAL/PLATELET
Abs Immature Granulocytes: 0.02 10*3/uL (ref 0.00–0.07)
Basophils Absolute: 0 10*3/uL (ref 0.0–0.1)
Basophils Relative: 0 %
Eosinophils Absolute: 0.1 10*3/uL (ref 0.0–0.5)
Eosinophils Relative: 2 %
HCT: 39.5 % (ref 36.0–46.0)
Hemoglobin: 13.5 g/dL (ref 12.0–15.0)
Immature Granulocytes: 0 %
Lymphocytes Relative: 22 %
Lymphs Abs: 1 10*3/uL (ref 0.7–4.0)
MCH: 32.6 pg (ref 26.0–34.0)
MCHC: 34.2 g/dL (ref 30.0–36.0)
MCV: 95.4 fL (ref 80.0–100.0)
Monocytes Absolute: 0.4 10*3/uL (ref 0.1–1.0)
Monocytes Relative: 10 %
Neutro Abs: 3 10*3/uL (ref 1.7–7.7)
Neutrophils Relative %: 66 %
Platelets: 160 10*3/uL (ref 150–400)
RBC: 4.14 MIL/uL (ref 3.87–5.11)
RDW: 12.4 % (ref 11.5–15.5)
WBC: 4.5 10*3/uL (ref 4.0–10.5)
nRBC: 0 % (ref 0.0–0.2)

## 2019-04-09 LAB — COMPREHENSIVE METABOLIC PANEL
ALT: 18 U/L (ref 0–44)
AST: 23 U/L (ref 15–41)
Albumin: 4.2 g/dL (ref 3.5–5.0)
Alkaline Phosphatase: 79 U/L (ref 38–126)
Anion gap: 8 (ref 5–15)
BUN: 19 mg/dL (ref 8–23)
CO2: 28 mmol/L (ref 22–32)
Calcium: 9.6 mg/dL (ref 8.9–10.3)
Chloride: 100 mmol/L (ref 98–111)
Creatinine, Ser: 0.66 mg/dL (ref 0.44–1.00)
GFR calc Af Amer: >60 mL/min (ref >60)
GFR calc non Af Amer: >60 mL/min (ref >60)
Glucose, Bld: 92 mg/dL (ref 70–99)
Potassium: 4 mmol/L (ref 3.5–5.1)
Sodium: 136 mmol/L (ref 135–145)
Total Bilirubin: 0.6 mg/dL (ref 0.3–1.2)
Total Protein: 7.1 g/dL (ref 6.5–8.1)

## 2019-04-09 NOTE — Telephone Encounter (Signed)
Patient is compliant with coumadin management, will refill X 6 months.   

## 2019-04-10 ENCOUNTER — Ambulatory Visit (INDEPENDENT_AMBULATORY_CARE_PROVIDER_SITE_OTHER): Payer: Medicare Other

## 2019-04-10 DIAGNOSIS — Z7901 Long term (current) use of anticoagulants: Secondary | ICD-10-CM

## 2019-04-10 LAB — CANCER ANTIGEN 27.29: CA 27.29: 14.4 U/mL (ref 0.0–38.6)

## 2019-04-10 LAB — POCT INR: INR: 2.1 (ref 2.0–3.0)

## 2019-04-10 NOTE — Patient Instructions (Signed)
INR lab obtainined 5/7/ results received by coumadin nurse on 5/8:  2.1  Instructions given to Becky Farrell, Becky Farrell to communicate with patient: To continue taking same dose 7.5 mg daily EXCEPT for 3.75mg  on Mon and 5mg  on Friday.  Recheck on 04/23/19.  Becky Sports, RN will discuss with patient and schedule recheck.

## 2019-04-13 ENCOUNTER — Encounter: Payer: Self-pay | Admitting: Podiatry

## 2019-04-13 ENCOUNTER — Other Ambulatory Visit: Payer: Self-pay

## 2019-04-13 ENCOUNTER — Ambulatory Visit (INDEPENDENT_AMBULATORY_CARE_PROVIDER_SITE_OTHER): Payer: Medicare Other | Admitting: Podiatry

## 2019-04-13 VITALS — Temp 96.4°F

## 2019-04-13 DIAGNOSIS — B351 Tinea unguium: Secondary | ICD-10-CM

## 2019-04-13 DIAGNOSIS — D689 Coagulation defect, unspecified: Secondary | ICD-10-CM

## 2019-04-13 DIAGNOSIS — M79609 Pain in unspecified limb: Secondary | ICD-10-CM | POA: Diagnosis not present

## 2019-04-13 NOTE — Progress Notes (Signed)
[facility]  [address] [place], [place] [address] Phone: [phone]  Fax: [phone]   Telemedicine Office Visit:  04/14/2019  Referring physician: Jearld Fenton, NP  I connected with [name] on 04/14/2019 at 9:24 AM by videoconferencing and verified that I was speaking with the correct person using 2 identifiers.  The patient was at home.  I discussed the limitations, risk, security and privacy concerns of performing an evaluation and management service by videoconferencing and the availability of in person appointments.  I also discussed with the patient that there may be a patient responsible charge related to this service. The patient expressed understanding and agreed to proceed.   Chief Complaint: Becky Farrell is a 85 y.o. female stage I right breast cancer who is seen for 1 month assessment and initiation of hormonal therapy.  HPI: The patient was last seen in the oncology clinic on 03/17/2019. At that time, she was doing well.  She had completed whole breast radiation with Dr. Donella Stade on 02/03/2019. She has not yet begun oral anti-estrogen therapy.  We discussed both tamoxifen and aromatase inhibitors.  She wished to discuss her options with [name], NP.  She had a virtual visit on 03/30/2019 with [name] on 03/30/2019.  Decision was not made about hormone therapy at that time. She had not yet undergone a dental check-up. She was told to decide on treatment for her osteoporosis.   During the interim, the patient has done well.   She is not concerned about her history of blood clots, and is currently on Coumadin. She initially underwent a hypercoagulable work-up in [place].  She is unaware of the results.  Her INR is checked regularly.  INR was 2.1 on 04/10/2019.   She currently takes a Calcium and Vitamin D supplement. Her most recent bone density scan on 07/31/2018 showed a T-score of -3.4.  She is doing weight  bearing exercises.  Her dentist is Dr May.  She needs a dental check secondary to a "chip".  She is concerned about privacy due to her recent enrollment in [place].    Past Medical History:  Diagnosis Date  . Colon polyps   . Family history of breast cancer   . History of pulmonary embolism 2011  . Hx of melanoma of skin 2003   Right leg  . Hypertension   . Melanoma ([place]) 2003  . Seizures ([place]) 1993   no meds since (approx) 2014  . Vertigo    x1. R/T perforated ear drum prior to 2013    Past Surgical History:  Procedure Laterality Date  . ABDOMINAL HYSTERECTOMY  05/2014  . BREAST LUMPECTOMY WITH RADIOACTIVE SEED LOCALIZATION Right 10/10/2018   Procedure: BREAST LUMPECTOMY WITH RADIOACTIVE SEED LOCALIZATION;  Surgeon: Jovita Kussmaul, MD;  Location: [facility];  Service: General;  Laterality: Right;  . CATARACT EXTRACTION W/PHACO Right 01/06/2018   Procedure: CATARACT EXTRACTION PHACO AND INTRAOCULAR LENS PLACEMENT ([place]) RIGHT;  Surgeon: Leandrew Koyanagi, MD;  Location: [facility];  Service: Ophthalmology;  Laterality: Right;  . CATARACT EXTRACTION W/PHACO Left 01/29/2018   Procedure: CATARACT EXTRACTION PHACO AND INTRAOCULAR LENS PLACEMENT ([place]) LEFT;  Surgeon: Leandrew Koyanagi, MD;  Location: [facility];  Service: Ophthalmology;  Laterality: Left;  . left hand surgery  05/2011   s/p fall  . OTHER SURGICAL HISTORY  2003   Removal of lymphoma from melonoma 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     Family History  Problem Relation Age  of Onset  . Heart disease Mother   . Aortic aneurysm Father   . Breast cancer Daughter 19  . Breast cancer Niece 44    Social History:  reports that she has never smoked. She has never used smokeless tobacco. She reports that she does not drink alcohol or use drugs. The patient recently moved from [place] to [place].  Her daughter and grandchildren live in [place].  She has other children in [place].  Her husband has been recovering from  prostate cancer.  She is a retired Pharmacist, hospital. The patient is accompanied by her husband today.  Participants in the patient's visit and their role in the encounter included the patient, her husband, and Vito Berger, CMA, today.  The intake visit was provided by Vito Berger, CMA.  Allergies:  Allergies  Allergen Reactions  . Clobetasol Other (See Comments)    vertigo  . Clobetasol Propionate Other (See Comments)    This is ointment form UNSPECIFIED REACTION   . Phenergan [Promethazine Hcl] Other (See Comments)    Rapid heart rate  . Dextromethorphan Palpitations and Other (See Comments)    promethazine with DM, rapid hearbeat  . Zithromax [Azithromycin] Palpitations and Other (See Comments)    Per OSH report, pt unable to recall Rapid heart rate    Current Medications: Current Outpatient Medications  Medication Sig Dispense Refill  . aspirin 81 MG tablet Take 81 mg by mouth daily.    Marland Kitchen atenolol (TENORMIN) 25 MG tablet Take 1 tablet (25 mg total) by mouth daily. 90 tablet 0  . Cholecalciferol (VITAMIN D3) 2000 UNITS TABS Take 2,000 Units by mouth daily.     . hydrochlorothiazide (MICROZIDE) 12.5 MG capsule Take 1 capsule (12.5 mg total) by mouth daily. 90 capsule 0  . lisinopril (PRINIVIL,ZESTRIL) 10 MG tablet Take 1 tablet (10 mg total) by mouth daily. 90 tablet 0  . warfarin (COUMADIN) 5 MG tablet TAKE 1 TAB BY MOUTH DAILY ON FRIDAYS, 1/3 TABS ON MONDAYS, 1.5 TABS DAILY ON ALL OTHER DAYS. 90 tablet 0  . warfarin (COUMADIN) 7.5 MG tablet TAKE 1 TABLET EVERY DAY AND AS DIRECTED 70 tablet 1  . Wheat Dextrin (BENEFIBER PO) Take 2 each by mouth See admin instructions. Mix 2 teaspoons and drink 3 times daily    . Iodoquinol-HC-Aloe Polysacch (ALCORTIN A) 1-2-1 % GEL Apply topically as needed (for rash).    . polyethylene glycol (MIRALAX / GLYCOLAX) packet Take 17 g by mouth every other day.     No current facility-administered medications for this visit.     Review of  Systems  Constitutional: Negative.  Negative for chills, diaphoresis, fever, malaise/fatigue and weight loss.       Feels "good".  HENT: Positive for hearing loss. Negative for congestion, ear discharge, ear pain, nosebleeds, sinus pain and sore throat.        Needs dental appointment.  Eyes: Negative.  Negative for blurred vision, double vision and photophobia.  Respiratory: Negative.  Negative for cough, hemoptysis, sputum production and shortness of breath.        Recurrent PE x 2.  Cardiovascular: Negative.  Negative for chest pain, palpitations, orthopnea, leg swelling and PND.  Gastrointestinal: Negative for abdominal pain, blood in stool, constipation, diarrhea, heartburn, melena, nausea and vomiting.  Genitourinary: Negative.  Negative for dysuria, frequency, hematuria and urgency.  Musculoskeletal: Negative for back pain, falls, joint pain, myalgias and neck pain.       Osteoporosis.  Skin: Negative.  Negative for itching  and rash.  Neurological: Negative for dizziness, tremors, sensory change, speech change, focal weakness, seizures (history), weakness and headaches.       Chronic balance issues.  Endo/Heme/Allergies: Bruises/bleeds easily (on Coumadin).  Psychiatric/Behavioral: Negative.  Negative for depression and memory loss. The patient is not nervous/anxious and does not have insomnia.   All other systems reviewed and are negative.   Performance status (ECOG): 1  Physical Exam  Constitutional: She is oriented to person, place, and time. She appears well-developed and well-nourished. No distress.  HENT:  Head: Normocephalic and atraumatic.  Short gray hair.  Eyes: Conjunctivae and EOM are normal. No scleral icterus.  Glasses.  Neurological: She is alert and oriented to person, place, and time.  Skin: She is not diaphoretic.  Psychiatric: She has a normal mood and affect. Her behavior is normal. Judgment and thought content normal.  Nursing note reviewed.   No visits  with results within 3 Day(s) from this visit.  Latest known visit with results is:  Anti-coag visit on 04/10/2019  Component Date Value Ref Range Status  . INR 04/10/2019 2.1  2.0 - 3.0 Final    Assessment:  DELENA CASEBEER is a 85 y.o. female with stage T1aNx right breast cancer s/p lumpectomy on 10/10/2018. Pathology revealed a 0.4 cm grade I invasive ductal carcinoma with mucinous features.  There was intermediate grade DCIS.  Margins were uninvolved (1.0 cm posterior margin).  There were fibrocystic changes including apocrine metaplasia.  Tumor was ER + (100%), PR + (90%), and Her2/neu-.  Ki67 was 10%.  Pathologic stage was T1aNx.  CA27.29 was 14.1 on 09/08/2018.  Bilateral screening mammogram on 07/31/2018 revealed a possible mass in the right breast.  Right diagnostic mammogram and targeted ultrasound on 08/13/2018 revealed a 4 x 5 x 8 mm indeterminate mass at the 10 o'clock position 9 cm from the nipple in the right breast.  She received radiation from 12/16/2018 - 02/03/2019.  She received 5040 cGy in 28 fractions with a scar boost of another 1400 cGy using electron beam.  CA27.29 has been followed: 14.1 on 09/08/2018 and 14.4 on 04/09/2019.  Bone density on 07/31/2018 revealed osteoporosis with a T-score of -3.4 in the right forearm radius and -1.9 in the right femoral neck.  She has a history of pulmonary embolism x 2.  Initial event occurred in 2011.  She had a recurrent pulmonary embolism after holding Coumadin for 1 month for a planned colonoscopy.  She is on life long anticoagulation (Coumadin).  She has a family history of breast cancer (daughter, age 19, and niece).  [facility] genetic testing was negative.  Symptomatically, she denies any new complaints.  She is unsure about endocrine therapy.  She needs to see the dentist.  Plan: 1.    Review labs from 04/09/2019. 2.   Stage T1aNx right breast cancer She is s/p surgery and radiation. Re-review plan for endocrine therapy  post radiation.             Discus tamoxifen versus aromatase inhibitors.               Potential side effects reviewed. Discuss potential down side of tamoxifen.  Risk of thrombosis 5% Discuss potential down side of an aromatase inhibitor.  Bone loss. Previously discussed tamoxifen for 5 years or tamoxifen for 2-3 years followed by an aromatase inhibitor (AI) x 2-3 years to complete 5 years.             Discuss no bone loss associated  with tamoxifen.             Discuss increased efficacy with an aromatase inhibitor (AI).             Tamoxifen for 2-3 years followed by an AI for 2-3 years achives the same benefit as if she had been on an AI for 5 years.    Patient does not recall discussing with [name], NP.  Will call. 3.   Osteoporosis Encourage calcium 1200 mg/day and vitamin D. Patient performing weight bearing exercises. Discuss Prolia and need for dental clearance (needs an appointment). 4.   RTC in 1 month for MD assessment and labs (LFTs) post initiation of endocrine therapy.  I discussed the assessment and treatment plan with the patient.  The patient was provided an opportunity to ask questions and all were answered.  The patient agreed with the plan and demonstrated an understanding of the instructions.  The patient was advised to call back or seek an in person evaluation if the symptoms worsen or if the condition fails to improve as anticipated.  I provided 25 minutes (9:24 AM - 9:49AM) of face-to-face video visit time during this this encounter and > 50% was spent counseling as documented under my assessment and plan.  I provided these services from the [facility] office.   Nolon Stalls, MD, PhD  04/14/2019, 9:24 AM  I, Molly Dorshimer, am acting as Education administrator for [employer]. Mike Gip, MD, PhD.  I, Melissa C. Mike Gip, MD, have reviewed the above documentation for accuracy and completeness, and I agree with the above.

## 2019-04-13 NOTE — Progress Notes (Signed)
Complaint:  Visit Type: Patient returns to my office for continued preventative foot care services. Complaint: Patient states" my nails have grown long and thick and become painful to walk and wear shoes"  The patient presents for preventative foot care services. No changes to ROS.  Patient is taking coumadin.  Podiatric Exam: Vascular: dorsalis pedis and posterior tibial pulses are palpable bilateral. Capillary return is immediate. Temperature gradient is WNL. Skin turgor WNL  Sensorium: Normal Semmes Weinstein monofilament test. Normal tactile sensation bilaterally. Nail Exam: Pt has thick disfigured discolored nails with subungual debris noted bilateral entire nail hallux through fifth toenails Ulcer Exam: There is no evidence of ulcer or pre-ulcerative changes or infection. Orthopedic Exam: Muscle tone and strength are WNL. No limitations in general ROM. No crepitus or effusions noted. Foot type and digits show no abnormalities. Bony prominences are unremarkable. Skin: No Porokeratosis. No infection or ulcers  Diagnosis:  Onychomycosis, , Pain in right toe, pain in left toes  Treatment & Plan Procedures and Treatment: Consent by patient was obtained for treatment procedures. The patient understood the discussion of treatment and procedures well. All questions were answered thoroughly reviewed. Debridement of mycotic and hypertrophic toenails, 1 through 5 bilateral and clearing of subungual debris. No ulceration, no infection noted.   Return Visit-Office Procedure: Patient instructed to return to the office for a follow up visit 3 months for continued evaluation and treatment.    Gardiner Barefoot DPM

## 2019-04-14 ENCOUNTER — Other Ambulatory Visit: Payer: Medicare Other

## 2019-04-14 ENCOUNTER — Encounter: Payer: Self-pay | Admitting: Hematology and Oncology

## 2019-04-14 ENCOUNTER — Inpatient Hospital Stay (HOSPITAL_BASED_OUTPATIENT_CLINIC_OR_DEPARTMENT_OTHER): Payer: Medicare Other | Admitting: Hematology and Oncology

## 2019-04-14 DIAGNOSIS — M810 Age-related osteoporosis without current pathological fracture: Secondary | ICD-10-CM | POA: Diagnosis not present

## 2019-04-14 DIAGNOSIS — C50211 Malignant neoplasm of upper-inner quadrant of right female breast: Secondary | ICD-10-CM

## 2019-04-14 DIAGNOSIS — Z170 Estrogen receptor positive status [ER+]: Secondary | ICD-10-CM

## 2019-04-14 NOTE — Progress Notes (Signed)
No new changes noted today. The patient Na,me, DOB and address has been verified by phone today.

## 2019-04-17 ENCOUNTER — Telehealth: Payer: Self-pay | Admitting: Internal Medicine

## 2019-04-17 NOTE — Telephone Encounter (Signed)
Dr.Corcoran called to speak to [facility] about patient. Dr.Corcoran can be reached on her cell phone [phone].

## 2019-04-20 NOTE — Telephone Encounter (Signed)
Attempted to call back 5/15 and 5/18.

## 2019-04-23 ENCOUNTER — Ambulatory Visit (INDEPENDENT_AMBULATORY_CARE_PROVIDER_SITE_OTHER): Payer: Medicare Other

## 2019-04-23 DIAGNOSIS — Z7901 Long term (current) use of anticoagulants: Secondary | ICD-10-CM

## 2019-04-23 LAB — POCT INR: INR: 2.4 (ref 2.0–3.0)

## 2019-05-03 ENCOUNTER — Other Ambulatory Visit: Payer: Self-pay | Admitting: Internal Medicine

## 2019-05-05 NOTE — Telephone Encounter (Signed)
This was just refilled on 04/19/19 for #70, 1 refill.  RX request denied.

## 2019-05-06 ENCOUNTER — Other Ambulatory Visit: Payer: Self-pay | Admitting: Internal Medicine

## 2019-05-06 ENCOUNTER — Other Ambulatory Visit: Payer: Self-pay

## 2019-05-06 DIAGNOSIS — C50211 Malignant neoplasm of upper-inner quadrant of right female breast: Secondary | ICD-10-CM

## 2019-05-06 DIAGNOSIS — M810 Age-related osteoporosis without current pathological fracture: Secondary | ICD-10-CM

## 2019-05-06 DIAGNOSIS — Z170 Estrogen receptor positive status [ER+]: Secondary | ICD-10-CM

## 2019-05-06 NOTE — Patient Instructions (Signed)
NR lab obtainined 5/21/ results received by coumadin nurse on 5/21:  2.4  Instructions given to [facility] nurse on 5/21, Tyrone Schimke to communicate with patient: To continue taking same dose 7.5 mg daily EXCEPT for 3.75mg  on Mon and 5mg  on Friday.  Recheck on 05/14/19.  Tyrone Schimke, Nurse, will discuss with patient and schedule recheck.

## 2019-05-11 NOTE — Progress Notes (Signed)
[facility]  [address] [place], [place] [address] Phone: [phone]  Fax: [phone]   Clinic Day:  05/12/2019  Referring physician: Jearld Fenton, NP  Chief Complaint: Becky Farrell is a 85 y.o. female with stage I right breast cancer who is seen for 1 month assessment and initiation of hormonal therapy.  HPI: The patient was last seen in the medical oncology clinic on 04/14/2019. At that time, she denied any new complaints.  She was unsure about endocrine therapy.  She needed to see her dentist for clearance for Prolia. She was waiting to get in contact with [name] to discuss endocrine therapy options.   [name], NP attempted to contact the patient on 04/17/2019 and 04/20/2019 and was unsuccessful. The patient has not yet been started on endocrine therapy.   She has a history of pulmonary embolism x 2.  INR on 04/23/2019 was 2.4.   During the interim, she is doing well.  She has not yet been to the dentist, but she will follow up soon.    Past Medical History:  Diagnosis Date  . Colon polyps   . Family history of breast cancer   . History of pulmonary embolism 2011  . Hx of melanoma of skin 2003   Right leg  . Hypertension   . Melanoma ([place]) 2003  . Seizures ([place]) 1993   no meds since (approx) 2014  . Vertigo    x1. R/T perforated ear drum prior to 2013    Past Surgical History:  Procedure Laterality Date  . ABDOMINAL HYSTERECTOMY  05/2014  . BREAST LUMPECTOMY WITH RADIOACTIVE SEED LOCALIZATION Right 10/10/2018   Procedure: BREAST LUMPECTOMY WITH RADIOACTIVE SEED LOCALIZATION;  Surgeon: Jovita Kussmaul, MD;  Location: [facility];  Service: General;  Laterality: Right;  . CATARACT EXTRACTION W/PHACO Right 01/06/2018   Procedure: CATARACT EXTRACTION PHACO AND INTRAOCULAR LENS PLACEMENT ([place]) RIGHT;  Surgeon: Leandrew Koyanagi, MD;  Location: [facility];  Service: Ophthalmology;  Laterality: Right;  . CATARACT  EXTRACTION W/PHACO Left 01/29/2018   Procedure: CATARACT EXTRACTION PHACO AND INTRAOCULAR LENS PLACEMENT ([place]) LEFT;  Surgeon: Leandrew Koyanagi, MD;  Location: [facility];  Service: Ophthalmology;  Laterality: Left;  . left hand surgery  05/2011   s/p fall  . OTHER SURGICAL HISTORY  2003   Removal of lymphoma from melonoma 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     Family History  Problem Relation Age of Onset  . Heart disease Mother   . Aortic aneurysm Father   . Breast cancer Daughter 69  . Breast cancer Niece 49    Social History:  reports that she has never smoked. She has never used smokeless tobacco. She reports that she does not drink alcohol or use drugs. The patient recently moved from [place] to [place]. Her daughter and grandchildren live in [place]. She has other children in [place]. Her husband has been recovering from prostate cancer. She is a retired Pharmacist, hospital. 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  Allergies:  Allergies  Allergen Reactions  . Clobetasol Other (See Comments)    vertigo  . Clobetasol Propionate Other (See Comments)    This is ointment form UNSPECIFIED REACTION   . Phenergan [Promethazine Hcl] Other (See Comments)    Rapid heart rate  . Dextromethorphan Palpitations and Other (See Comments)    promethazine with DM, rapid hearbeat  . Zithromax [Azithromycin] Palpitations and Other (See Comments)    Per OSH report, pt unable to recall Rapid  heart rate    Current Medications: Current Outpatient Medications  Medication Sig Dispense Refill  . aspirin 81 MG tablet Take 81 mg by mouth daily.    Marland Kitchen atenolol (TENORMIN) 25 MG tablet Take 1 tablet (25 mg total) by mouth daily. 90 tablet 0  . Cholecalciferol (VITAMIN D3) 2000 UNITS TABS Take 2,000 Units by mouth daily.     . hydrochlorothiazide (MICROZIDE) 12.5 MG capsule Take 1 capsule (12.5 mg total) by mouth daily. 90 capsule 0  . Iodoquinol-HC-Aloe Polysacch (ALCORTIN A) 1-2-1 % GEL Apply topically as needed  (for rash).    Marland Kitchen lisinopril (ZESTRIL) 10 MG tablet TAKE 1 TABLET BY MOUTH EVERY DAY 90 tablet 0  . polyethylene glycol (MIRALAX / GLYCOLAX) packet Take 17 g by mouth every other day.    . warfarin (COUMADIN) 5 MG tablet TAKE 1 TAB BY MOUTH DAILY ON FRIDAYS, 1/3 TABS ON MONDAYS, 1.5 TABS DAILY ON ALL OTHER DAYS. 90 tablet 0  . warfarin (COUMADIN) 7.5 MG tablet TAKE 1 TABLET EVERY DAY AND AS DIRECTED 70 tablet 1  . Wheat Dextrin (BENEFIBER PO) Take 2 each by mouth See admin instructions. Mix 2 teaspoons and drink 3 times daily     No current facility-administered medications for this visit.     Review of Systems  Constitutional: Negative.  Negative for chills, diaphoresis, fever, malaise/fatigue and weight loss.       Feels "good".  HENT: Positive for hearing loss. Negative for congestion, ear discharge, ear pain, nosebleeds, sinus pain and sore throat.        Needs dental appointment.  Eyes: Negative.  Negative for blurred vision, double vision and photophobia.  Respiratory: Negative.  Negative for cough, hemoptysis, sputum production and shortness of breath.        Recurrent PE x 2.  Cardiovascular: Negative.  Negative for chest pain, palpitations, orthopnea, leg swelling and PND.  Gastrointestinal: Negative.  Negative for abdominal pain, blood in stool, constipation, diarrhea, heartburn, melena, nausea and vomiting.  Genitourinary: Negative.  Negative for dysuria, frequency, hematuria and urgency.  Musculoskeletal: Negative for back pain, falls, joint pain, myalgias and neck pain.       Osteoporosis.  Skin: Negative.  Negative for itching and rash.  Neurological: Negative for dizziness, tremors, sensory change, speech change, focal weakness, seizures (history), weakness and headaches.       Chronic balance issues.  Endo/Heme/Allergies: Bruises/bleeds easily (on Coumadin).  Psychiatric/Behavioral: Negative.  Negative for depression and memory loss. The patient is not nervous/anxious and  does not have insomnia.   All other systems reviewed and are negative.  Performance status (ECOG): 1  Physical Exam  Constitutional: She is oriented to person, place, and time. She appears well-developed and well-nourished. No distress.  HENT:  Head: Normocephalic and atraumatic.  Mouth/Throat: No oropharyngeal exudate.  Wearing a blue cap and mask.  Short gray hair.  Eyes: Pupils are equal, round, and reactive to light. Conjunctivae and EOM are normal. No scleral icterus.  Glasses. Blue eyes.  Neck: Normal range of motion. Neck supple. No JVD present.  Cardiovascular: Normal rate, regular rhythm and normal heart sounds. Exam reveals no gallop.  No murmur heard. Pulmonary/Chest: Effort normal and breath sounds normal. No respiratory distress. She has no wheezes. She has no rales.  Abdominal: Soft. Bowel sounds are normal. She exhibits no distension and no mass. There is no abdominal tenderness. There is no rebound and no guarding.  Musculoskeletal: Normal range of motion.  General: No tenderness or edema.  Lymphadenopathy:    She has no cervical adenopathy.    She has no axillary adenopathy.       Left: No supraclavicular adenopathy present.  Neurological: She is alert and oriented to person, place, and time.  Skin: Skin is warm and dry. No rash noted. She is not diaphoretic. No erythema.  Psychiatric: She has a normal mood and affect. Her behavior is normal. Judgment and thought content normal.  Nursing note and vitals reviewed.   Appointment on 05/12/2019  Component Date Value Ref Range Status  . Sodium 05/12/2019 137  135 - 145 mmol/L Final  . Potassium 05/12/2019 3.9  3.5 - 5.1 mmol/L Final  . Chloride 05/12/2019 102  98 - 111 mmol/L Final  . CO2 05/12/2019 PENDING  22 - 32 mmol/L Incomplete  . Glucose, Bld 05/12/2019 74  70 - 99 mg/dL Final  . BUN 05/12/2019 18  8 - 23 mg/dL Final  . Creatinine, Ser 05/12/2019 0.68  0.44 - 1.00 mg/dL Final  . Calcium 05/12/2019 9.6   8.9 - 10.3 mg/dL Final  . Total Protein 05/12/2019 7.0  6.5 - 8.1 g/dL Final  . Albumin 05/12/2019 3.8  3.5 - 5.0 g/dL Final  . AST 05/12/2019 18  15 - 41 U/L Final  . ALT 05/12/2019 15  0 - 44 U/L Final  . Alkaline Phosphatase 05/12/2019 78  38 - 126 U/L Final  . Total Bilirubin 05/12/2019 0.7  0.3 - 1.2 mg/dL Final  . GFR calc non Af Amer 05/12/2019 >60  >60 mL/min Final  . GFR calc Af Amer 05/12/2019 >60  >60 mL/min Final   Performed at [facility], [address], [place], [place] [address]  . Anion gap 05/12/2019 PENDING  5 - 15 Incomplete  . WBC 05/12/2019 3.8* 4.0 - 10.5 K/uL Final  . RBC 05/12/2019 4.02  3.87 - 5.11 MIL/uL Final  . Hemoglobin 05/12/2019 13.0  12.0 - 15.0 g/dL Final  . HCT 05/12/2019 38.4  36.0 - 46.0 % Final  . MCV 05/12/2019 95.5  80.0 - 100.0 fL Final  . MCH 05/12/2019 32.3  26.0 - 34.0 pg Final  . MCHC 05/12/2019 33.9  30.0 - 36.0 g/dL Final  . RDW 05/12/2019 12.5  11.5 - 15.5 % Final  . Platelets 05/12/2019 159  150 - 400 K/uL Final  . nRBC 05/12/2019 0.0  0.0 - 0.2 % Final  . Neutrophils Relative % 05/12/2019 64  % Final  . Neutro Abs 05/12/2019 2.5  1.7 - 7.7 K/uL Final  . Lymphocytes Relative 05/12/2019 21  % Final  . Lymphs Abs 05/12/2019 0.8  0.7 - 4.0 K/uL Final  . Monocytes Relative 05/12/2019 10  % Final  . Monocytes Absolute 05/12/2019 0.4  0.1 - 1.0 K/uL Final  . Eosinophils Relative 05/12/2019 4  % Final  . Eosinophils Absolute 05/12/2019 0.2  0.0 - 0.5 K/uL Final  . Basophils Relative 05/12/2019 1  % Final  . Basophils Absolute 05/12/2019 0.0  0.0 - 0.1 K/uL Final  . Immature Granulocytes 05/12/2019 0  % Final  . Abs Immature Granulocytes 05/12/2019 0.01  0.00 - 0.07 K/uL Final   Performed at [facility], [address] [place], [place] [address]    Assessment:  Becky Farrell is a 85 y.o. female with stage T1aNx right breast cancers/p lumpectomy on 10/10/2018. Pathology revealed a 0.4 cm grade I invasive  ductal carcinoma with mucinous features. There was intermediate  grade DCIS. Margins were uninvolved (1.0 cm posterior margin). There were fibrocystic changes including apocrine metaplasia. Tumor was ER + (100%), PR + (90%), and Her2/neu-. Ki67 was 10%. Pathologic stagewas T1aNx. CA27.29was 14.1 on 09/08/2018.  Bilateral screening mammogramon 07/31/2018 revealed a possible mass in the right breast. Right diagnostic mammogramand targeted ultrasound on 08/13/2018 revealed a 4 x 5 x 8 mm indeterminate mass at the 10 o'clock position 9 cm from the nipple in the right breast.  Shereceivedradiationfrom 12/16/2018- 02/03/2019.She received5040cGyin 28 fractions with a scar boost of another 1400 cGyusing electron beam.  CA27.29 has been followed: 14.1 on 09/08/2018 and 14.4 on 04/09/2019.  Bone densityon 07/31/2018 revealed osteoporosiswith a T-score of -3.4 in the right forearm radius and -1.9 in the right femoral neck.  She has a history of pulmonary embolismx 2. Initial event occurred in 2011. She had a recurrent pulmonary embolism after holding Coumadin for 1 month for a planned colonoscopy. She is on life long anticoagulation (Coumadin). She has afamily historyof breast cancer (daughter, age 28, and niece). [facility] genetic testingwas negative.  Symptomatically, she is doing well.  She voices no concerns.  She has not seen the dentist.  Plan: 1.   Labs today:  CBC with diff, CMP. 2.Stage T1aNx right breast cancer She is s/p surgery and radiation. Discuss endocrine therapy s/p radiation. Re-review tamoxifen versus aromatase inhibitors.  Potential side effects reviewed. Risk of thrombosis estimated 5% with tamoxifen. Discuss potential bone loss with an aromatase inhibitor. Discuss 5 years of tamoxifen OR an aromatase inhibitor OR  tamoxifen for2-3 years followed byan aromataseinhibitor (AI) x 2-3 years. After some discussion plan to  proceed with tamoxifen  However, there is a drug interaction with tamoxifen and Coumadin.   Increase INR with tamoxifen thus combination contraindicated.   Discuss briefly with patient. Patient would like me to follow-up with [name]- called. Anticipate trial of an aromatase inhibitor. 3. Osteoporosis Continue calcium 1200 mg/day and vitamin D. Encourage weight bearing exercises. Discuss Proliaafter dental clearance.  Patient to call.  Preauth Prolia. 4. RTC in 1 month for MD assessment and labs (LFTs).  I discussed the assessment and treatment plan with the patient.  The patient was provided an opportunity to ask questions and all were answered.  The patient agreed with the plan and demonstrated an understanding of the instructions.  The patient was advised to call back if the symptoms worsen or if the condition fails to improve as anticipated.  I provided 25 minutes of face-to-face time during this this encounter and > 50% was spent counseling as documented under my assessment and plan.    Lequita Asal, MD, PhD    05/12/2019, 9:47 AM  I, Cloyde Reams Dorshimer, am acting as Education administrator for [employer]. Mike Gip, MD, PhD.  I, Melissa C. Mike Gip, MD, have reviewed the above documentation for accuracy and completeness, and I agree with the above.

## 2019-05-12 ENCOUNTER — Inpatient Hospital Stay: Payer: Medicare Other | Attending: Hematology and Oncology | Admitting: Hematology and Oncology

## 2019-05-12 ENCOUNTER — Inpatient Hospital Stay: Payer: Medicare Other

## 2019-05-12 ENCOUNTER — Other Ambulatory Visit: Payer: Self-pay | Admitting: Hematology and Oncology

## 2019-05-12 ENCOUNTER — Encounter: Payer: Self-pay | Admitting: Hematology and Oncology

## 2019-05-12 ENCOUNTER — Other Ambulatory Visit: Payer: Self-pay

## 2019-05-12 VITALS — BP 158/89 | HR 75 | Temp 97.2°F | Resp 16 | Wt 145.3 lb

## 2019-05-12 DIAGNOSIS — Z79899 Other long term (current) drug therapy: Secondary | ICD-10-CM | POA: Insufficient documentation

## 2019-05-12 DIAGNOSIS — M810 Age-related osteoporosis without current pathological fracture: Secondary | ICD-10-CM | POA: Diagnosis not present

## 2019-05-12 DIAGNOSIS — Z170 Estrogen receptor positive status [ER+]: Secondary | ICD-10-CM

## 2019-05-12 DIAGNOSIS — Z888 Allergy status to other drugs, medicaments and biological substances status: Secondary | ICD-10-CM | POA: Diagnosis not present

## 2019-05-12 DIAGNOSIS — I10 Essential (primary) hypertension: Secondary | ICD-10-CM | POA: Diagnosis not present

## 2019-05-12 DIAGNOSIS — Z86711 Personal history of pulmonary embolism: Secondary | ICD-10-CM | POA: Diagnosis not present

## 2019-05-12 DIAGNOSIS — Z881 Allergy status to other antibiotic agents status: Secondary | ICD-10-CM | POA: Insufficient documentation

## 2019-05-12 DIAGNOSIS — Z7901 Long term (current) use of anticoagulants: Secondary | ICD-10-CM

## 2019-05-12 DIAGNOSIS — Z803 Family history of malignant neoplasm of breast: Secondary | ICD-10-CM | POA: Diagnosis not present

## 2019-05-12 DIAGNOSIS — C50211 Malignant neoplasm of upper-inner quadrant of right female breast: Secondary | ICD-10-CM

## 2019-05-12 DIAGNOSIS — Z923 Personal history of irradiation: Secondary | ICD-10-CM | POA: Diagnosis not present

## 2019-05-12 DIAGNOSIS — Z8249 Family history of ischemic heart disease and other diseases of the circulatory system: Secondary | ICD-10-CM | POA: Insufficient documentation

## 2019-05-12 DIAGNOSIS — Z85820 Personal history of malignant melanoma of skin: Secondary | ICD-10-CM | POA: Diagnosis not present

## 2019-05-12 DIAGNOSIS — C50411 Malignant neoplasm of upper-outer quadrant of right female breast: Secondary | ICD-10-CM | POA: Diagnosis not present

## 2019-05-12 DIAGNOSIS — Z79811 Long term (current) use of aromatase inhibitors: Secondary | ICD-10-CM | POA: Diagnosis not present

## 2019-05-12 DIAGNOSIS — Z86010 Personal history of colonic polyps: Secondary | ICD-10-CM | POA: Diagnosis not present

## 2019-05-12 LAB — CBC WITH DIFFERENTIAL/PLATELET
Abs Immature Granulocytes: 0.01 10*3/uL (ref 0.00–0.07)
Basophils Absolute: 0 10*3/uL (ref 0.0–0.1)
Basophils Relative: 1 %
Eosinophils Absolute: 0.2 10*3/uL (ref 0.0–0.5)
Eosinophils Relative: 4 %
HCT: 38.4 % (ref 36.0–46.0)
Hemoglobin: 13 g/dL (ref 12.0–15.0)
Immature Granulocytes: 0 %
Lymphocytes Relative: 21 %
Lymphs Abs: 0.8 10*3/uL (ref 0.7–4.0)
MCH: 32.3 pg (ref 26.0–34.0)
MCHC: 33.9 g/dL (ref 30.0–36.0)
MCV: 95.5 fL (ref 80.0–100.0)
Monocytes Absolute: 0.4 10*3/uL (ref 0.1–1.0)
Monocytes Relative: 10 %
Neutro Abs: 2.5 10*3/uL (ref 1.7–7.7)
Neutrophils Relative %: 64 %
Platelets: 159 10*3/uL (ref 150–400)
RBC: 4.02 MIL/uL (ref 3.87–5.11)
RDW: 12.5 % (ref 11.5–15.5)
WBC: 3.8 10*3/uL — ABNORMAL LOW (ref 4.0–10.5)
nRBC: 0 % (ref 0.0–0.2)

## 2019-05-12 LAB — COMPREHENSIVE METABOLIC PANEL
ALT: 15 U/L (ref 0–44)
AST: 18 U/L (ref 15–41)
Albumin: 3.8 g/dL (ref 3.5–5.0)
Alkaline Phosphatase: 78 U/L (ref 38–126)
Anion gap: 7 (ref 5–15)
BUN: 18 mg/dL (ref 8–23)
CO2: 28 mmol/L (ref 22–32)
Calcium: 9.6 mg/dL (ref 8.9–10.3)
Chloride: 102 mmol/L (ref 98–111)
Creatinine, Ser: 0.68 mg/dL (ref 0.44–1.00)
GFR calc Af Amer: >60 mL/min (ref >60)
GFR calc non Af Amer: >60 mL/min (ref >60)
Glucose, Bld: 74 mg/dL (ref 70–99)
Potassium: 3.9 mmol/L (ref 3.5–5.1)
Sodium: 137 mmol/L (ref 135–145)
Total Bilirubin: 0.7 mg/dL (ref 0.3–1.2)
Total Protein: 7 g/dL (ref 6.5–8.1)

## 2019-05-12 NOTE — Patient Instructions (Addendum)
Tamoxifen oral tablet What is this medicine? TAMOXIFEN (ta MOX i fen) blocks the effects of estrogen. It is commonly used to treat breast cancer. It is also used to decrease the chance of breast cancer coming back in women who have received treatment for the disease. It may also help prevent breast cancer in women who have a high risk of developing breast cancer. This medicine may be used for other purposes; ask your health care provider or pharmacist if you have questions. COMMON BRAND NAME(S): Nolvadex What should I tell my health care provider before I take this medicine? They need to know if you have any of these conditions: -blood clots -blood disease -cataracts or impaired eyesight -endometriosis -high calcium levels -high cholesterol -irregular menstrual cycles -liver disease -stroke -uterine fibroids -an unusual reaction to tamoxifen, other medicines, foods, dyes, or preservatives -pregnant or trying to get pregnant -breast-feeding How should I use this medicine? Take this medicine by mouth with a glass of water. Follow the directions on the prescription label. You can take it with or without food. Take your medicine at regular intervals. Do not take your medicine more often than directed. Do not stop taking except on your doctor's advice. A special MedGuide will be given to you by the pharmacist with each prescription and refill. Be sure to read this information carefully each time. Talk to your pediatrician regarding the use of this medicine in children. While this drug may be prescribed for selected conditions, precautions do apply. Overdosage: If you think you have taken too much of this medicine contact a poison control center or emergency room at once. NOTE: This medicine is only for you. Do not share this medicine with others. What if I miss a dose? If you miss a dose, take it as soon as you can. If it is almost time for your next dose, take only that dose. Do not take  double or extra doses. What may interact with this medicine? Do not take this medicine with any of the following medications: -cisapride -certain medicines for irregular heart beat like dofetilide, dronedarone, quinidine -certain medicines for fungal infection like fluconazole, posaconazole -pimozide -saquinavir -thioridazine This medicine may also interact with the following medications: -aminoglutethimide -anastrozole -bromocriptine -chemotherapy drugs -female hormones, like estrogens and birth control pills -letrozole -medroxyprogesterone -phenobarbital -rifampin -warfarin This list may not describe all possible interactions. Give your health care provider a list of all the medicines, herbs, non-prescription drugs, or dietary supplements you use. Also tell them if you smoke, drink alcohol, or use illegal drugs. Some items may interact with your medicine. What should I watch for while using this medicine? Visit your doctor or health care professional for regular checks on your progress. You will need regular pelvic exams, breast exams, and mammograms. If you are taking this medicine to reduce your risk of getting breast cancer, you should know that this medicine does not prevent all types of breast cancer. If breast cancer or other problems occur, there is no guarantee that it will be found at an early stage. Do not become pregnant while taking this medicine or for 2 months after stopping it. Women should inform their doctor if they wish to become pregnant or think they might be pregnant. There is a potential for serious side effects to an unborn child. Talk to your health care professional or pharmacist for more information. Do not breast-feed an infant while taking this medicine or for 3 months after stopping it. This medicine may interfere with the  ability to have a child. Talk with your doctor or health care professional if you are concerned about your fertility. What side effects may  I notice from receiving this medicine? Side effects that you should report to your doctor or health care professional as soon as possible: -allergic reactions like skin rash, itching or hives, swelling of the face, lips, or tongue -changes in vision -changes in your menstrual cycle -difficulty walking or talking -new breast lumps -numbness -pelvic pain or pressure -redness, blistering, peeling or loosening of the skin, including inside the mouth -signs and symptoms of a dangerous change in heartbeat or heart rhythm like chest pain, dizziness, fast or irregular heartbeat, palpitations, feeling faint or lightheaded, falls, breathing problems -sudden chest pain -swelling, pain or tenderness in your calf or leg -unusual bruising or bleeding -vaginal discharge that is bloody, brown, or rust -weakness -yellowing of the whites of the eyes or skin Side effects that usually do not require medical attention (report to your doctor or health care professional if they continue or are bothersome): -fatigue -hair loss, although uncommon and is usually mild -headache -hot flashes -impotence (in men) -nausea, vomiting (mild) -vaginal discharge (white or clear) This list may not describe all possible side effects. Call your doctor for medical advice about side effects. You may report side effects to FDA at 1-800-FDA-1088. Where should I keep my medicine? Keep out of the reach of children. Store at room temperature between 20 and 25 degrees C (68 and 77 degrees F). Protect from light. Keep container tightly closed. Throw away any unused medicine after the expiration date. NOTE: This sheet is a summary. It may not cover all possible information. If you have questions about this medicine, talk to your doctor, pharmacist, or health care provider.  2019 Elsevier/Gold Standard (2018-03-12 16:49:57) Denosumab injection What is this medicine? DENOSUMAB (den oh sue mab) slows bone breakdown. Prolia is used to  treat osteoporosis in women after menopause and in men, and in people who are taking corticosteroids for 6 months or more. Delton See is used to treat a high calcium level due to cancer and to prevent bone fractures and other bone problems caused by multiple myeloma or cancer bone metastases. Delton See is also used to treat giant cell tumor of the bone. This medicine may be used for other purposes; ask your health care provider or pharmacist if you have questions. COMMON BRAND NAME(S): Prolia, XGEVA What should I tell my health care provider before I take this medicine? They need to know if you have any of these conditions: -dental disease -having surgery or tooth extraction -infection -kidney disease -low levels of calcium or Vitamin D in the blood -malnutrition -on hemodialysis -skin conditions or sensitivity -thyroid or parathyroid disease -an unusual reaction to denosumab, other medicines, foods, dyes, or preservatives -pregnant or trying to get pregnant -breast-feeding How should I use this medicine? This medicine is for injection under the skin. It is given by a health care professional in a hospital or clinic setting. A special MedGuide will be given to you before each treatment. Be sure to read this information carefully each time. For Prolia, talk to your pediatrician regarding the use of this medicine in children. Special care may be needed. For Delton See, talk to your pediatrician regarding the use of this medicine in children. While this drug may be prescribed for children as young as 13 years for selected conditions, precautions do apply. Overdosage: If you think you have taken too much of this medicine contact  a poison control center or emergency room at once. NOTE: This medicine is only for you. Do not share this medicine with others. What if I miss a dose? It is important not to miss your dose. Call your doctor or health care professional if you are unable to keep an appointment. What may  interact with this medicine? Do not take this medicine with any of the following medications: -other medicines containing denosumab This medicine may also interact with the following medications: -medicines that lower your chance of fighting infection -steroid medicines like prednisone or cortisone This list may not describe all possible interactions. Give your health care provider a list of all the medicines, herbs, non-prescription drugs, or dietary supplements you use. Also tell them if you smoke, drink alcohol, or use illegal drugs. Some items may interact with your medicine. What should I watch for while using this medicine? Visit your doctor or health care professional for regular checks on your progress. Your doctor or health care professional may order blood tests and other tests to see how you are doing. Call your doctor or health care professional for advice if you get a fever, chills or sore throat, or other symptoms of a cold or flu. Do not treat yourself. This drug may decrease your body's ability to fight infection. Try to avoid being around people who are sick. You should make sure you get enough calcium and vitamin D while you are taking this medicine, unless your doctor tells you not to. Discuss the foods you eat and the vitamins you take with your health care professional. See your dentist regularly. Brush and floss your teeth as directed. Before you have any dental work done, tell your dentist you are receiving this medicine. Do not become pregnant while taking this medicine or for 5 months after stopping it. Talk with your doctor or health care professional about your birth control options while taking this medicine. Women should inform their doctor if they wish to become pregnant or think they might be pregnant. There is a potential for serious side effects to an unborn child. Talk to your health care professional or pharmacist for more information. What side effects may I notice  from receiving this medicine? Side effects that you should report to your doctor or health care professional as soon as possible: -allergic reactions like skin rash, itching or hives, swelling of the face, lips, or tongue -bone pain -breathing problems -dizziness -jaw pain, especially after dental work -redness, blistering, peeling of the skin -signs and symptoms of infection like fever or chills; cough; sore throat; pain or trouble passing urine -signs of low calcium like fast heartbeat, muscle cramps or muscle pain; pain, tingling, numbness in the hands or feet; seizures -unusual bleeding or bruising -unusually weak or tired Side effects that usually do not require medical attention (report to your doctor or health care professional if they continue or are bothersome): -constipation -diarrhea -headache -joint pain -loss of appetite -muscle pain -runny nose -tiredness -upset stomach This list may not describe all possible side effects. Call your doctor for medical advice about side effects. You may report side effects to FDA at 1-800-FDA-1088. Where should I keep my medicine? This medicine is only given in a clinic, doctor's office, or other health care setting and will not be stored at home. NOTE: This sheet is a summary. It may not cover all possible information. If you have questions about this medicine, talk to your doctor, pharmacist, or health care provider.  2019  Elsevier/Gold Standard (2018-03-28 16:10:44) Letrozole tablets What is this medicine? LETROZOLE (LET roe zole) blocks the production of estrogen. It is used to treat breast cancer. This medicine may be used for other purposes; ask your health care provider or pharmacist if you have questions. COMMON BRAND NAME(S): Femara What should I tell my health care provider before I take this medicine? They need to know if you have any of these conditions: -high cholesterol -liver disease -osteoporosis (weak bones) -an  unusual or allergic reaction to letrozole, other medicines, foods, dyes, or preservatives -pregnant or trying to get pregnant -breast-feeding How should I use this medicine? Take this medicine by mouth with a glass of water. You may take it with or without food. Follow the directions on the prescription label. Take your medicine at regular intervals. Do not take your medicine more often than directed. Do not stop taking except on your doctor's advice. Talk to your pediatrician regarding the use of this medicine in children. Special care may be needed. Overdosage: If you think you have taken too much of this medicine contact a poison control center or emergency room at once. NOTE: This medicine is only for you. Do not share this medicine with others. What if I miss a dose? If you miss a dose, take it as soon as you can. If it is almost time for your next dose, take only that dose. Do not take double or extra doses. What may interact with this medicine? Do not take this medicine with any of the following medications: -estrogens, like hormone replacement therapy or birth control pills This medicine may also interact with the following medications: -dietary supplements such as androstenedione or DHEA -prasterone -tamoxifen This list may not describe all possible interactions. Give your health care provider a list of all the medicines, herbs, non-prescription drugs, or dietary supplements you use. Also tell them if you smoke, drink alcohol, or use illegal drugs. Some items may interact with your medicine. What should I watch for while using this medicine? Tell your doctor or healthcare professional if your symptoms do not start to get better or if they get worse. Do not become pregnant while taking this medicine or for 3 weeks after stopping it. Women should inform their doctor if they wish to become pregnant or think they might be pregnant. There is a potential for serious side effects to an unborn  child. Talk to your health care professional or pharmacist for more information. Do not breast-feed while taking this medicine or for 3 weeks after stopping it. This medicine may interfere with the ability to have a child. Talk with your doctor or health care professional if you are concerned about your fertility. Using this medicine for a long time may increase your risk of low bone mass. Talk to your doctor about bone health. You may get drowsy or dizzy. Do not drive, use machinery, or do anything that needs mental alertness until you know how this medicine affects you. Do not stand or sit up quickly, especially if you are an older patient. This reduces the risk of dizzy or fainting spells. You may need blood work done while you are taking this medicine. What side effects may I notice from receiving this medicine? Side effects that you should report to your doctor or health care professional as soon as possible: -allergic reactions like skin rash, itching, or hives -bone fracture -chest pain -signs and symptoms of a blood clot such as breathing problems; changes in vision; chest pain;  severe, sudden headache; pain, swelling, warmth in the leg; trouble speaking; sudden numbness or weakness of the face, arm or leg -vaginal bleeding Side effects that usually do not require medical attention (report to your doctor or health care professional if they continue or are bothersome): -bone, back, joint, or muscle pain -dizziness -fatigue -fluid retention -headache -hot flashes, night sweats -nausea -weight gain This list may not describe all possible side effects. Call your doctor for medical advice about side effects. You may report side effects to FDA at 1-800-FDA-1088. Where should I keep my medicine? Keep out of the reach of children. Store between 15 and 30 degrees C (59 and 86 degrees F). Throw away any unused medicine after the expiration date. NOTE: This sheet is a summary. It may not cover  all possible information. If you have questions about this medicine, talk to your doctor, pharmacist, or health care provider.  2019 Elsevier/Gold Standard (2016-06-25 11:10:41)

## 2019-05-12 NOTE — Progress Notes (Signed)
Pt here for follow up. Patient reports lightheadedness, "change in balance", and weakness in left leg. States this has been going on for "couple of years". Has made PCP aware of this problem.

## 2019-05-14 ENCOUNTER — Ambulatory Visit (INDEPENDENT_AMBULATORY_CARE_PROVIDER_SITE_OTHER): Payer: Medicare Other

## 2019-05-14 DIAGNOSIS — Z7901 Long term (current) use of anticoagulants: Secondary | ICD-10-CM | POA: Diagnosis not present

## 2019-05-14 LAB — POCT INR: INR: 2.6 (ref 2.0–3.0)

## 2019-05-14 NOTE — Patient Instructions (Signed)
INR today: 2.6  Instructions given to [facility] nurse, [name], RN To continue taking same dose 7.5 mg daily EXCEPT for 3.75mg  on Mon and 5mg  on Friday.  Recheck in 4 weeks.

## 2019-06-02 ENCOUNTER — Other Ambulatory Visit: Payer: Self-pay | Admitting: Internal Medicine

## 2019-06-04 NOTE — Progress Notes (Signed)
[facility] : [facility]  [address] [place], [place] [address] Phone: [phone]  Fax: [phone]   Clinic Day:  06/09/2019  Referring physician: Jearld Fenton, NP  Chief Complaint: Becky Farrell is a 85 y.o. female with stage I right breast cancer who is seen for 1 month assessment.  HPI: The patient was last seen in the medical oncology clinic on 05/12/2019. At that time, she was doing well. She voiced no concerns. She had not seen the dentist. WBC 3,800, hemoglobin 13.0, hematocrit 38.4, platelets 159,000.  Initial plan was for initiation of tamoxifen secondary to osteoporosis.  Tamoxifen was not started secondary to the interaction with Coumadin.  Lab on 05/14/2019: INR 2.6.   During the interim, the patient states "I'm good". She feels weakness in her left leg. She reports being consistent taking Coumadin; 7.5 mg Saturday, Sunday, Tuesday, and Thursday. She takes 1.5 mg on Wednesday and 5 mg on Friday.   She reports a dentist appointment on 07/01/2019 with Dr. Stanton Kidney for two crowns and a deep cleaning. She notes only half the work is done on her teeth. The patient is interested in taking Femara and holding Prolia until her dental procedures are completed.  Her weight is down 3 pounds. Her BP was 124/72 in the clinic today.    Past Medical History:  Diagnosis Date  . Colon polyps   . Family history of breast cancer   . History of pulmonary embolism 2011  . Hx of melanoma of skin 2003   Right leg  . Hypertension   . Melanoma ([place]) 2003  . Seizures ([place]) 1993   no meds since (approx) 2014  . Vertigo    x1. R/T perforated ear drum prior to 2013    Past Surgical History:  Procedure Laterality Date  . ABDOMINAL HYSTERECTOMY  05/2014  . BREAST LUMPECTOMY WITH RADIOACTIVE SEED LOCALIZATION Right 10/10/2018   Procedure: BREAST LUMPECTOMY WITH RADIOACTIVE SEED LOCALIZATION;  Surgeon: Jovita Kussmaul, MD;  Location: [place];  Service: General;   Laterality: Right;  . CATARACT EXTRACTION W/PHACO Right 01/06/2018   Procedure: CATARACT EXTRACTION PHACO AND INTRAOCULAR LENS PLACEMENT ([place]) RIGHT;  Surgeon: Leandrew Koyanagi, MD;  Location: [place] [facility];  Service: Ophthalmology;  Laterality: Right;  . CATARACT EXTRACTION W/PHACO Left 01/29/2018   Procedure: CATARACT EXTRACTION PHACO AND INTRAOCULAR LENS PLACEMENT ([place]) LEFT;  Surgeon: Leandrew Koyanagi, MD;  Location: [facility];  Service: Ophthalmology;  Laterality: Left;  . left hand surgery  05/2011   s/p fall  . OTHER SURGICAL HISTORY  2003   Removal of lymphoma from melonoma 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     Family History  Problem Relation Age of Onset  . Heart disease Mother   . Aortic aneurysm Father   . Breast cancer Daughter 40  . Breast cancer Niece 49    Social History:  reports that she has never smoked. She has never used smokeless tobacco. She reports that she does not drink alcohol or use drugs. The patient recently moved from [place] to [place]. Her daughter and grandchildren live in [place]. She has other children in [place]. Her husband has been recovering from prostate cancer. She is a retired Pharmacist, hospital.The patient is alone today.  Allergies:  Allergies  Allergen Reactions  . Clobetasol Other (See Comments)    vertigo  . Clobetasol Propionate Other (See Comments)    This is ointment form UNSPECIFIED REACTION   . Phenergan [Promethazine Hcl] Other (See Comments)  Rapid heart rate  . Dextromethorphan Palpitations and Other (See Comments)    promethazine with DM, rapid hearbeat  . Zithromax [Azithromycin] Palpitations and Other (See Comments)    Per OSH report, pt unable to recall Rapid heart rate    Current Medications: Current Outpatient Medications  Medication Sig Dispense Refill  . aspirin 81 MG tablet Take 81 mg by mouth daily.    Marland Kitchen atenolol (TENORMIN) 25 MG tablet Take 1 tablet (25 mg total) by mouth daily. 90 tablet 0  .  Cholecalciferol (VITAMIN D3) 2000 UNITS TABS Take 2,000 Units by mouth daily.     . hydrochlorothiazide (MICROZIDE) 12.5 MG capsule TAKE 1 CAPSULE BY MOUTH EVERY DAY 90 capsule 0  . Iodoquinol-HC-Aloe Polysacch (ALCORTIN A) 1-2-1 % GEL Apply topically as needed (for rash).    Marland Kitchen lisinopril (ZESTRIL) 10 MG tablet TAKE 1 TABLET BY MOUTH EVERY DAY 90 tablet 0  . polyethylene glycol (MIRALAX / GLYCOLAX) packet Take 17 g by mouth every other day.    . warfarin (COUMADIN) 5 MG tablet TAKE 1 TAB BY MOUTH DAILY ON FRIDAYS, 1/3 TABS ON MONDAYS, 1.5 TABS DAILY ON ALL OTHER DAYS. 90 tablet 0  . warfarin (COUMADIN) 7.5 MG tablet TAKE 1 TABLET EVERY DAY AND AS DIRECTED 70 tablet 1  . Wheat Dextrin (BENEFIBER PO) Take 2 each by mouth See admin instructions. Mix 2 teaspoons and drink 3 times daily     No current facility-administered medications for this visit.     Review of Systems  Constitutional: Positive for weight loss (down 3 lbs.). Negative for chills, diaphoresis, fever and malaise/fatigue.       "I'm good".  HENT: Positive for hearing loss. Negative for congestion, ear discharge, ear pain, nosebleeds, sinus pain and sore throat.        Dental issues ongoing.  Eyes: Negative.  Negative for blurred vision, double vision and photophobia.  Respiratory: Negative.  Negative for cough, hemoptysis, sputum production and shortness of breath.        Recurrent PE x 2.  Cardiovascular: Negative.  Negative for chest pain, palpitations, orthopnea, leg swelling and PND.  Gastrointestinal: Negative.  Negative for abdominal pain, blood in stool, constipation, diarrhea, heartburn, melena, nausea and vomiting.  Genitourinary: Negative.  Negative for dysuria, frequency, hematuria and urgency.  Musculoskeletal: Negative for back pain, falls, joint pain, myalgias and neck pain.       Osteoporosis.  Skin: Negative.  Negative for itching and rash.  Neurological: Positive for weakness (left leg). Negative for dizziness,  tremors, sensory change, speech change, focal weakness, seizures (history) and headaches.       Chronic balance issues.  Endo/Heme/Allergies: Bruises/bleeds easily (on Coumadin).  Psychiatric/Behavioral: Negative.  Negative for depression and memory loss. The patient is not nervous/anxious and does not have insomnia.   All other systems reviewed and are negative.  Performance status (ECOG): 1  Vital Signs Blood pressure 124/72, pulse 61, temperature (!) 97.2 F (36.2 C), temperature source Tympanic, resp. rate 18, weight 142 lb 1.4 oz (64.4 kg), SpO2 98 %.   Physical Exam  Constitutional: She is oriented to person, place, and time. She appears well-developed and well-nourished. No distress.  HENT:  Head: Normocephalic and atraumatic.  Wearing a blue cap and mask.  Short gray hair.  Eyes: Conjunctivae and EOM are normal. No scleral icterus.  Glasses. Blue eyes.  Cardiovascular: Normal heart sounds.  Musculoskeletal: Normal range of motion.        General: No tenderness or edema.  Comments: Left thigh felt warmer.  Neurological: She is alert and oriented to person, place, and time.  Skin: Skin is warm and dry. No rash noted. She is not diaphoretic. No erythema.  Psychiatric: She has a normal mood and affect. Her behavior is normal. Judgment and thought content normal.  Nursing note and vitals reviewed.   Appointment on 06/09/2019  Component Date Value Ref Range Status  . Total Protein 06/09/2019 7.0  6.5 - 8.1 g/dL Final  . Albumin 06/09/2019 4.0  3.5 - 5.0 g/dL Final  . AST 06/09/2019 22  15 - 41 U/L Final  . ALT 06/09/2019 15  0 - 44 U/L Final  . Alkaline Phosphatase 06/09/2019 76  38 - 126 U/L Final  . Total Bilirubin 06/09/2019 0.6  0.3 - 1.2 mg/dL Final  . Bilirubin, Direct 06/09/2019 <0.1  0.0 - 0.2 mg/dL Final  . Indirect Bilirubin 06/09/2019 NOT CALCULATED  0.3 - 0.9 mg/dL Final   Performed at [facility], [address], [place], [place] [address]     Assessment:  Becky Farrell is a 85 y.o. female with stage T1aNx right breast cancers/p lumpectomy on 10/10/2018. Pathology revealed a 0.4 cm grade I invasive ductal carcinoma with mucinous features. There was intermediate grade DCIS. Margins were uninvolved (1.0 cm posterior margin). There were fibrocystic changes including apocrine metaplasia. Tumor was ER + (100%), PR + (90%), and Her2/neu-. Ki67 was 10%. Pathologic stagewas T1aNx. CA27.29was 14.1 on 09/08/2018.  Bilateral screening mammogramon 07/31/2018 revealed a possible mass in the right breast. Right diagnostic mammogramand targeted ultrasound on 08/13/2018 revealed a 4 x 5 x 8 mm indeterminate mass at the 10 o'clock position 9 cm from the nipple in the right breast.  Shereceivedradiationfrom 12/16/2018- 02/03/2019.She received5040cGyin 28 fractions with a scar boost of another 1400 cGyusing electron beam.  CA27.29 has been followed: 14.1 on 09/08/2018 and 14.4 on 04/09/2019.  Bone densityon 07/31/2018 revealed osteoporosiswith a T-score of -3.4 in the right forearm radius and -1.9 in the right femoral neck.  She has a history of pulmonary embolismx 2. Initial event occurred in 2011. She had a recurrent pulmonary embolism after holding Coumadin for 1 month for a planned colonoscopy. She is on life long anticoagulation (Coumadin). She has afamily historyof breast cancer (daughter, age 35, and niece). [facility] genetic testingwas negative.  Symptomatically, she is doing well.  She requires additional dental work.    Plan: 1.   Labs today:  CBC with diff, CMP. 2.Stage T1aNx right breast cancer She is s/p surgery and radiation. Re-discuss endocrine therapy s/p radiation. Tamoxifen versus aromatase inhibitors.  Side effects re-reviewed Discuss drug interaction with tamoxifen and Coumadin             Pharmacy was previously contacted.    INR increases with tamoxifen and  thus contraindicated. Discuss trial of an aromatase inhibitor. Patient consented to treatment. Rx:  Femara 2.5 mg a day. 3. Osteoporosis Continue calcium and vitamin D.   Encourage weight-bearing exercises.   Follow-up with dentistry when possible.  Await dental clearance   Anticipate initiation of Prolia.   4. RTC in 1 month for MD assessment, labs (CBC with diff, CMP, CA 27-29) and Prolia.  I discussed the assessment and treatment plan with the patient.  The patient was provided an opportunity to ask questions and all were answered.  The patient agreed with the plan and demonstrated an understanding of the instructions.  The patient was advised to call back if the symptoms worsen  or if the condition fails to improve as anticipated.  I provided 25 minutes of face-to-face time during this this encounter and > 50% was spent counseling as documented under my assessment and plan.    Lequita Asal, MD, PhD    06/09/2019, 10:07 AM  I, Selena Batten, am acting as scribe for [employer]. Mike Gip, MD, PhD.  I, Melissa C. Mike Gip, MD, have reviewed the above documentation for accuracy and completeness, and I agree with the above.

## 2019-06-09 ENCOUNTER — Inpatient Hospital Stay: Payer: Medicare Other | Attending: Hematology and Oncology | Admitting: Hematology and Oncology

## 2019-06-09 ENCOUNTER — Inpatient Hospital Stay: Payer: Medicare Other

## 2019-06-09 ENCOUNTER — Other Ambulatory Visit: Payer: Self-pay

## 2019-06-09 VITALS — BP 124/72 | HR 61 | Temp 97.2°F | Resp 18 | Wt 142.1 lb

## 2019-06-09 DIAGNOSIS — Z803 Family history of malignant neoplasm of breast: Secondary | ICD-10-CM

## 2019-06-09 DIAGNOSIS — C50211 Malignant neoplasm of upper-inner quadrant of right female breast: Secondary | ICD-10-CM

## 2019-06-09 DIAGNOSIS — R531 Weakness: Secondary | ICD-10-CM | POA: Insufficient documentation

## 2019-06-09 DIAGNOSIS — Z888 Allergy status to other drugs, medicaments and biological substances status: Secondary | ICD-10-CM | POA: Diagnosis not present

## 2019-06-09 DIAGNOSIS — Z79811 Long term (current) use of aromatase inhibitors: Secondary | ICD-10-CM | POA: Insufficient documentation

## 2019-06-09 DIAGNOSIS — Z881 Allergy status to other antibiotic agents status: Secondary | ICD-10-CM | POA: Diagnosis not present

## 2019-06-09 DIAGNOSIS — Z7901 Long term (current) use of anticoagulants: Secondary | ICD-10-CM | POA: Diagnosis not present

## 2019-06-09 DIAGNOSIS — Z86718 Personal history of other venous thrombosis and embolism: Secondary | ICD-10-CM

## 2019-06-09 DIAGNOSIS — Z170 Estrogen receptor positive status [ER+]: Secondary | ICD-10-CM

## 2019-06-09 DIAGNOSIS — Z79899 Other long term (current) drug therapy: Secondary | ICD-10-CM | POA: Diagnosis not present

## 2019-06-09 DIAGNOSIS — Z86711 Personal history of pulmonary embolism: Secondary | ICD-10-CM | POA: Diagnosis not present

## 2019-06-09 DIAGNOSIS — Z8249 Family history of ischemic heart disease and other diseases of the circulatory system: Secondary | ICD-10-CM | POA: Insufficient documentation

## 2019-06-09 DIAGNOSIS — H9190 Unspecified hearing loss, unspecified ear: Secondary | ICD-10-CM | POA: Diagnosis not present

## 2019-06-09 DIAGNOSIS — Z8719 Personal history of other diseases of the digestive system: Secondary | ICD-10-CM

## 2019-06-09 DIAGNOSIS — Z923 Personal history of irradiation: Secondary | ICD-10-CM | POA: Diagnosis not present

## 2019-06-09 DIAGNOSIS — C50411 Malignant neoplasm of upper-outer quadrant of right female breast: Secondary | ICD-10-CM | POA: Insufficient documentation

## 2019-06-09 DIAGNOSIS — M810 Age-related osteoporosis without current pathological fracture: Secondary | ICD-10-CM

## 2019-06-09 LAB — HEPATIC FUNCTION PANEL
ALT: 15 U/L (ref 0–44)
AST: 22 U/L (ref 15–41)
Albumin: 4 g/dL (ref 3.5–5.0)
Alkaline Phosphatase: 76 U/L (ref 38–126)
Bilirubin, Direct: <0.1 mg/dL (ref 0.0–0.2)
Total Bilirubin: 0.6 mg/dL (ref 0.3–1.2)
Total Protein: 7 g/dL (ref 6.5–8.1)

## 2019-06-09 MED ORDER — LETROZOLE 2.5 MG PO TABS: 2.5000 mg | ORAL_TABLET | Freq: Every day | ORAL | 0 refills | Status: DC

## 2019-06-09 NOTE — Progress Notes (Signed)
Pt here for follow up. Reports left leg weakness x "few months". States she has noticed this since she has had to stop doing water classes but continues to walk daily. Denies any other concerns.

## 2019-06-09 NOTE — Patient Instructions (Signed)
Letrozole tablets What is this medicine? LETROZOLE (LET roe zole) blocks the production of estrogen. It is used to treat breast cancer. This medicine may be used for other purposes; ask your health care provider or pharmacist if you have questions. COMMON BRAND NAME(S): Femara What should I tell my health care provider before I take this medicine? They need to know if you have any of these conditions:  high cholesterol  liver disease  osteoporosis (weak bones)  an unusual or allergic reaction to letrozole, other medicines, foods, dyes, or preservatives  pregnant or trying to get pregnant  breast-feeding How should I use this medicine? Take this medicine by mouth with a glass of water. You may take it with or without food. Follow the directions on the prescription label. Take your medicine at regular intervals. Do not take your medicine more often than directed. Do not stop taking except on your doctor's advice. Talk to your pediatrician regarding the use of this medicine in children. Special care may be needed. Overdosage: If you think you have taken too much of this medicine contact a poison control center or emergency room at once. NOTE: This medicine is only for you. Do not share this medicine with others. What if I miss a dose? If you miss a dose, take it as soon as you can. If it is almost time for your next dose, take only that dose. Do not take double or extra doses. What may interact with this medicine? Do not take this medicine with any of the following medications:  estrogens, like hormone replacement therapy or birth control pills This medicine may also interact with the following medications:  dietary supplements such as androstenedione or DHEA  prasterone  tamoxifen This list may not describe all possible interactions. Give your health care provider a list of all the medicines, herbs, non-prescription drugs, or dietary supplements you use. Also tell them if you  smoke, drink alcohol, or use illegal drugs. Some items may interact with your medicine. What should I watch for while using this medicine? Tell your doctor or healthcare professional if your symptoms do not start to get better or if they get worse. Do not become pregnant while taking this medicine or for 3 weeks after stopping it. Women should inform their doctor if they wish to become pregnant or think they might be pregnant. There is a potential for serious side effects to an unborn child. Talk to your health care professional or pharmacist for more information. Do not breast-feed while taking this medicine or for 3 weeks after stopping it. This medicine may interfere with the ability to have a child. Talk with your doctor or health care professional if you are concerned about your fertility. Using this medicine for a long time may increase your risk of low bone mass. Talk to your doctor about bone health. You may get drowsy or dizzy. Do not drive, use machinery, or do anything that needs mental alertness until you know how this medicine affects you. Do not stand or sit up quickly, especially if you are an older patient. This reduces the risk of dizzy or fainting spells. You may need blood work done while you are taking this medicine. What side effects may I notice from receiving this medicine? Side effects that you should report to your doctor or health care professional as soon as possible:  allergic reactions like skin rash, itching, or hives  bone fracture  chest pain  signs and symptoms of a blood clot  such as breathing problems; changes in vision; chest pain; severe, sudden headache; pain, swelling, warmth in the leg; trouble speaking; sudden numbness or weakness of the face, arm or leg  vaginal bleeding Side effects that usually do not require medical attention (report to your doctor or health care professional if they continue or are bothersome):  bone, back, joint, or muscle  pain  dizziness  fatigue  fluid retention  headache  hot flashes, night sweats  nausea  weight gain This list may not describe all possible side effects. Call your doctor for medical advice about side effects. You may report side effects to FDA at 1-800-FDA-1088. Where should I keep my medicine? Keep out of the reach of children. Store between 15 and 30 degrees C (59 and 86 degrees F). Throw away any unused medicine after the expiration date. NOTE: This sheet is a summary. It may not cover all possible information. If you have questions about this medicine, talk to your doctor, pharmacist, or health care provider.  2020 Elsevier/Gold Standard (2016-06-25 11:10:41)  

## 2019-06-11 DIAGNOSIS — Z7901 Long term (current) use of anticoagulants: Secondary | ICD-10-CM | POA: Diagnosis not present

## 2019-06-11 LAB — POCT INR: INR: 2.4 (ref 2.0–3.0)

## 2019-06-12 ENCOUNTER — Ambulatory Visit (INDEPENDENT_AMBULATORY_CARE_PROVIDER_SITE_OTHER): Payer: Medicare Other

## 2019-06-12 DIAGNOSIS — Z7901 Long term (current) use of anticoagulants: Secondary | ICD-10-CM | POA: Diagnosis not present

## 2019-06-12 NOTE — Patient Instructions (Signed)
INR today: 2.4 Instructions given to [facility] nurse, [name], RN To continue taking same dose 7.5 mg daily EXCEPT for 3.75mg  on Mon and 5mg  on Friday.  Recheck in 4 weeks.

## 2019-06-16 ENCOUNTER — Telehealth: Payer: Self-pay | Admitting: Internal Medicine

## 2019-06-16 NOTE — Telephone Encounter (Signed)
Pt had prevnar 13 on 12/05/16 and pt's last pneumovax was on 12/07/2000 when pt was 61. Does pt need one more pneumovax?

## 2019-06-16 NOTE — Telephone Encounter (Signed)
Pt called to schedule a pnuemonia injection. Can you advise if she is due.

## 2019-06-16 NOTE — Telephone Encounter (Signed)
She should not need any more pneumonia vaccines.

## 2019-06-17 NOTE — Telephone Encounter (Signed)
Pt notified as instructed and pt voiced understanding.pt said the thing that prompted the call was a mychart message for pt to contact R BaityNP about getting a pneumonia shot but if R Baity NP said does not need a pneumonia shot she is fine with that.nothing further needed.

## 2019-06-18 DIAGNOSIS — H7202 Central perforation of tympanic membrane, left ear: Secondary | ICD-10-CM | POA: Diagnosis not present

## 2019-06-18 DIAGNOSIS — H6123 Impacted cerumen, bilateral: Secondary | ICD-10-CM | POA: Diagnosis not present

## 2019-07-01 ENCOUNTER — Encounter: Payer: Self-pay | Admitting: Hematology and Oncology

## 2019-07-01 ENCOUNTER — Other Ambulatory Visit: Payer: Self-pay | Admitting: Hematology and Oncology

## 2019-07-01 DIAGNOSIS — C50211 Malignant neoplasm of upper-inner quadrant of right female breast: Secondary | ICD-10-CM

## 2019-07-02 NOTE — Progress Notes (Signed)
Becky Farrell  [address], [address] [place], [place] [address] Phone: [phone]  Fax: [phone]   Clinic Day:  07/07/2019  Referring physician: Jearld Fenton, NP  Chief Complaint: Becky Farrell is a 85 y.o. female with  stage I right breast cancer  who is seen for 1 month assessment.  HPI: The patient was last seen in the medical oncology clinic on 06/09/2019. At that time, she was doing well.  Labs were normal.  We discussed the interaction with tamoxifen and Coumadin.  She began Femara.  Prolia was postponed as she required additional dental work.  INR was 2.4 on 07/09/20020.   During the interim, the patient is doing well. When walking around, she reports feeling dizzy and getting off balance, prior to taking Femara. While taking Femara, she has noticed an increase in these symptoms. She is not using a cane to remain steady. She is staying well hydrated.   The patient notes having 2 crowns, and a deep cleaning, and last week she had 2 more crowns and another deep cleaning.  Her dentist was contacted during her appointment.  Dental clearance for Prolia was provided.   Past Medical History:  Diagnosis Date  . Colon polyps   . Family history of breast cancer   . History of pulmonary embolism 2011  . Hx of melanoma of skin 2003   Right leg  . Hypertension   . Melanoma ([place]) 2003  . Seizures ([place]) 1993   no meds since (approx) 2014  . Vertigo    x1. R/T perforated ear drum prior to 2013    Past Surgical History:  Procedure Laterality Date  . ABDOMINAL HYSTERECTOMY  05/2014  . BREAST LUMPECTOMY WITH RADIOACTIVE SEED LOCALIZATION Right 10/10/2018   Procedure: BREAST LUMPECTOMY WITH RADIOACTIVE SEED LOCALIZATION;  Surgeon: Jovita Kussmaul, MD;  Location: [facility];  Service: General;  Laterality: Right;  . CATARACT EXTRACTION W/PHACO Right 01/06/2018   Procedure: CATARACT EXTRACTION PHACO AND INTRAOCULAR LENS PLACEMENT ([place]) RIGHT;  Surgeon: Leandrew Koyanagi, MD;  Location: [facility];  Service: Ophthalmology;  Laterality: Right;  . CATARACT EXTRACTION W/PHACO Left 01/29/2018   Procedure: CATARACT EXTRACTION PHACO AND INTRAOCULAR LENS PLACEMENT ([place]) LEFT;  Surgeon: Leandrew Koyanagi, MD;  Location: [facility];  Service: Ophthalmology;  Laterality: Left;  . left hand surgery  05/2011   s/p fall  . OTHER SURGICAL HISTORY  2003   Removal of lymphoma from melonoma 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     Family History  Problem Relation Age of Onset  . Heart disease Mother   . Aortic aneurysm Father   . Breast cancer Daughter 45  . Breast cancer Niece 5    Social History:  reports that she has never smoked. She has never used smokeless tobacco. She reports that she does not drink alcohol or use drugs. The patient recently moved from [place] to [place]. Her daughter and grandchildren live in [place]. She has other children in [place]. Her husband has been recovering from prostate cancer. She is a retired Pharmacist, hospital. 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  Allergies:  Allergies  Allergen Reactions  . Clobetasol Other (See Comments)    vertigo  . Clobetasol Propionate Other (See Comments)    This is ointment form UNSPECIFIED REACTION   . Phenergan [Promethazine Hcl] Other (See Comments)    Rapid heart rate  . Dextromethorphan Palpitations and Other (See Comments)    promethazine with DM, rapid hearbeat  . Zithromax [Azithromycin]  Palpitations and Other (See Comments)    Per OSH report, pt unable to recall Rapid heart rate    Current Medications: Current Outpatient Medications  Medication Sig Dispense Refill  . aspirin 81 MG tablet Take 81 mg by mouth daily.    Marland Kitchen atenolol (TENORMIN) 25 MG tablet TAKE 1 TABLET BY MOUTH EVERY DAY 90 tablet 1  . Cholecalciferol (VITAMIN D3) 2000 UNITS TABS Take 2,000 Units by mouth daily.     . hydrochlorothiazide (MICROZIDE) 12.5 MG capsule TAKE 1 CAPSULE BY MOUTH EVERY DAY 90 capsule 1  .  letrozole (FEMARA) 2.5 MG tablet TAKE 1 TABLET BY MOUTH EVERY DAY 30 tablet 0  . lisinopril (ZESTRIL) 10 MG tablet TAKE 1 TABLET BY MOUTH EVERY DAY 90 tablet 1  . polyethylene glycol (MIRALAX / GLYCOLAX) packet Take 17 g by mouth every other day.    . warfarin (COUMADIN) 5 MG tablet TAKE 1 TAB BY MOUTH DAILY ON FRIDAYS, 1/3 TABS ON MONDAYS, 1.5 TABS DAILY ON ALL OTHER DAYS. 90 tablet 0  . warfarin (COUMADIN) 7.5 MG tablet TAKE 1 TABLET EVERY DAY AND AS DIRECTED 70 tablet 1  . Iodoquinol-HC-Aloe Polysacch (ALCORTIN A) 1-2-1 % GEL Apply topically as needed (for rash).    . Wheat Dextrin (BENEFIBER PO) Take 2 each by mouth See admin instructions. Mix 2 teaspoons and drink 3 times daily     No current facility-administered medications for this visit.     Review of Systems  Constitutional: Negative for chills, diaphoresis, fever, malaise/fatigue and weight loss (up 1 pound).       Doing well.  HENT: Positive for hearing loss. Negative for congestion, ear discharge, ear pain, nosebleeds, sinus pain and sore throat.        No dental issues (cleared for Prolia by dentist).  Eyes: Negative.  Negative for blurred vision, double vision and photophobia.  Respiratory: Negative.  Negative for cough, hemoptysis, sputum production and shortness of breath.        Recurrent PE x 2.  Cardiovascular: Negative.  Negative for chest pain, palpitations, orthopnea, leg swelling and PND.  Gastrointestinal: Negative.  Negative for abdominal pain, blood in stool, constipation, diarrhea, heartburn, melena, nausea and vomiting.  Genitourinary: Negative.  Negative for dysuria, frequency, hematuria and urgency.  Musculoskeletal: Negative for back pain, falls, joint pain, myalgias and neck pain.       Osteoporosis.  Skin: Negative.  Negative for itching and rash.  Neurological: Positive for dizziness and weakness (left leg). Negative for tremors, sensory change, speech change, focal weakness, seizures (history) and  headaches.       Chronic balance issues.  Endo/Heme/Allergies: Bruises/bleeds easily (on Coumadin).  Psychiatric/Behavioral: Negative.  Negative for depression and memory loss. The patient is not nervous/anxious and does not have insomnia.   All other systems reviewed and are negative.  Performance status (ECOG): 1  Vitals Blood pressure (!) 147/84, pulse 62, temperature 98.1 F (36.7 C), temperature source Tympanic, resp. rate 18, height '5\' 5"'$  (1.651 m), weight 143 lb 4.8 oz (65 kg), SpO2 100 %.  Physical Exam  Constitutional: She is oriented to person, place, and time. She appears well-developed and well-nourished. No distress.  HENT:  Head: Normocephalic and atraumatic.  Mouth/Throat: Oropharynx is clear and moist. No oropharyngeal exudate.  Short gray hair.  Mask.  Eyes: Pupils are equal, round, and reactive to light. Conjunctivae and EOM are normal. No scleral icterus.  Glasses. Blue eyes.  Neck: Normal range of motion. Neck supple. No JVD present.  Cardiovascular: Normal rate, regular rhythm and normal heart sounds.  No murmur heard. Pulmonary/Chest: Effort normal and breath sounds normal. No respiratory distress. She has no wheezes. She has no rales. She exhibits no tenderness.  Abdominal: Soft. Bowel sounds are normal. She exhibits no distension and no mass. There is no abdominal tenderness. There is no rebound and no guarding.  Musculoskeletal: Normal range of motion.        General: No tenderness or edema.  Lymphadenopathy:    She has no cervical adenopathy.    She has no axillary adenopathy.       Right: No supraclavicular adenopathy present.       Left: No supraclavicular adenopathy present.  Neurological: She is alert and oriented to person, place, and time.  Skin: Skin is warm and dry. No rash noted. She is not diaphoretic. No erythema. No pallor.  Psychiatric: She has a normal mood and affect. Her behavior is normal. Judgment and thought content normal.  Nursing note  and vitals reviewed.   Appointment on 07/07/2019  Component Date Value Ref Range Status  . Sodium 07/07/2019 135  135 - 145 mmol/L Final  . Potassium 07/07/2019 4.0  3.5 - 5.1 mmol/L Final  . Chloride 07/07/2019 101  98 - 111 mmol/L Final  . CO2 07/07/2019 27  22 - 32 mmol/L Final  . Glucose, Bld 07/07/2019 80  70 - 99 mg/dL Final  . BUN 07/07/2019 23  8 - 23 mg/dL Final  . Creatinine, Ser 07/07/2019 0.67  0.44 - 1.00 mg/dL Final  . Calcium 07/07/2019 9.6  8.9 - 10.3 mg/dL Final  . Total Protein 07/07/2019 7.0  6.5 - 8.1 g/dL Final  . Albumin 07/07/2019 3.8  3.5 - 5.0 g/dL Final  . AST 07/07/2019 18  15 - 41 U/L Final  . ALT 07/07/2019 14  0 - 44 U/L Final  . Alkaline Phosphatase 07/07/2019 80  38 - 126 U/L Final  . Total Bilirubin 07/07/2019 0.3  0.3 - 1.2 mg/dL Final  . GFR calc non Af Amer 07/07/2019 >60  >60 mL/min Final  . GFR calc Af Amer 07/07/2019 >60  >60 mL/min Final  . Anion gap 07/07/2019 7  5 - 15 Final   Performed at [facility], [address], [place], [place] [address]    Assessment:  JOSHLYNN ALFONZO is a 85 y.o. female with stage T1aNx right breast cancers/p lumpectomy on 10/10/2018. Pathology revealed a 0.4 cm grade I invasive ductal carcinoma with mucinous features. There was intermediate grade DCIS. Margins were uninvolved (1.0 cm posterior margin). There were fibrocystic changes including apocrine metaplasia. Tumor was ER + (100%), PR + (90%), and Her2/neu-. Ki67 was 10%. Pathologic stagewas T1aNx. CA27.29was 14.1 on 09/08/2018.  Bilateral screening mammogramon 07/31/2018 revealed a possible mass in the right breast. Right diagnostic mammogramand targeted ultrasound on 08/13/2018 revealed a 4 x 5 x 8 mm indeterminate mass at the 10 o'clock position 9 cm from the nipple in the right breast.  Shereceivedradiationfrom 12/16/2018- 02/03/2019.She received5040cGyin 28 fractions with a scar boost of another 1400 cGyusing electron  beam.  She began Femara on 06/09/2019.  CA27.29 has been followed: 14.1 on 09/08/2018 and 14.4 on 04/09/2019.  Bone densityon 07/31/2018 revealed osteoporosiswith a T-score of -3.4 in the right forearm radius and -1.9 in the right femoral neck.  She has a history of pulmonary embolismx 2. Initial event occurred in 2011. She had a recurrent pulmonary embolism after holding Coumadin for 1 month for a  planned colonoscopy. She is on life long anticoagulation (Coumadin). She has afamily historyof breast cancer (daughter, age 42, and niece). [facility] genetic testingwas negative.  Symptomatically, she feels "fine".  Dental work is complete.  She notes some balance issues and slight dizziness which preceded initiation of Femara.  Plan: 1.    Labs today: CBC with diff, CMP, CA27.29. 2.Stage T1aNx right breast cancer She is s/p surgery and radiation. She began Femara on 06/09/2019. She is tolerating it well. Bilateral diagnostic mammogram on 08/04/2019. 3. Osteoporosis Patient on calcium and vitamin D.   Patient is [facility] with [facility]. Spoke with her dentist today.  She was cleared for Prolia.             Patient wished to read further information on Prolia.  Discuss awaiting initiation of Prolia when she felt comfortable.  4. RTC in 3 months for MD assessment and labs (CBC with diff, CMP, and CA27.29).  Addendum:  The patient consented to initiation of Prolia.  She received Prolia today in clinic.  I discussed the assessment and treatment plan with the patient.  The patient was provided an opportunity to ask questions and all were answered.  The patient agreed with the plan and demonstrated an understanding of the instructions.  The patient was advised to call back if the symptoms worsen or if the condition fails to improve as anticipated.  I provided 16 minutes of face-to-face time during this this encounter and > 50% was spent counseling as documented under my  assessment and plan.    Becky Asal, MD, PhD    07/07/2019, 10:35 AM  I, Selena Batten, am acting as scribe for [employer]. Becky Gip, MD, PhD.  I, Melissa C. Becky Gip, MD, have reviewed the above documentation for accuracy and completeness, and I agree with the above.

## 2019-07-03 ENCOUNTER — Other Ambulatory Visit: Payer: Self-pay | Admitting: Internal Medicine

## 2019-07-06 ENCOUNTER — Other Ambulatory Visit: Payer: Self-pay

## 2019-07-07 ENCOUNTER — Inpatient Hospital Stay: Payer: Medicare Other

## 2019-07-07 ENCOUNTER — Encounter: Payer: Self-pay | Admitting: Hematology and Oncology

## 2019-07-07 ENCOUNTER — Inpatient Hospital Stay: Payer: Medicare Other | Attending: Hematology and Oncology | Admitting: Hematology and Oncology

## 2019-07-07 ENCOUNTER — Other Ambulatory Visit: Payer: Self-pay

## 2019-07-07 VITALS — BP 147/84 | HR 62 | Temp 98.1°F | Resp 18 | Ht 65.0 in | Wt 143.3 lb

## 2019-07-07 DIAGNOSIS — Z79811 Long term (current) use of aromatase inhibitors: Secondary | ICD-10-CM | POA: Insufficient documentation

## 2019-07-07 DIAGNOSIS — Z803 Family history of malignant neoplasm of breast: Secondary | ICD-10-CM | POA: Insufficient documentation

## 2019-07-07 DIAGNOSIS — C50911 Malignant neoplasm of unspecified site of right female breast: Secondary | ICD-10-CM | POA: Insufficient documentation

## 2019-07-07 DIAGNOSIS — C50211 Malignant neoplasm of upper-inner quadrant of right female breast: Secondary | ICD-10-CM

## 2019-07-07 DIAGNOSIS — M810 Age-related osteoporosis without current pathological fracture: Secondary | ICD-10-CM | POA: Diagnosis not present

## 2019-07-07 DIAGNOSIS — Z170 Estrogen receptor positive status [ER+]: Secondary | ICD-10-CM

## 2019-07-07 DIAGNOSIS — Z923 Personal history of irradiation: Secondary | ICD-10-CM | POA: Diagnosis not present

## 2019-07-07 DIAGNOSIS — Z8249 Family history of ischemic heart disease and other diseases of the circulatory system: Secondary | ICD-10-CM | POA: Insufficient documentation

## 2019-07-07 DIAGNOSIS — Z79899 Other long term (current) drug therapy: Secondary | ICD-10-CM | POA: Insufficient documentation

## 2019-07-07 DIAGNOSIS — R42 Dizziness and giddiness: Secondary | ICD-10-CM | POA: Insufficient documentation

## 2019-07-07 DIAGNOSIS — Z86711 Personal history of pulmonary embolism: Secondary | ICD-10-CM | POA: Insufficient documentation

## 2019-07-07 DIAGNOSIS — Z7982 Long term (current) use of aspirin: Secondary | ICD-10-CM | POA: Insufficient documentation

## 2019-07-07 DIAGNOSIS — Z7901 Long term (current) use of anticoagulants: Secondary | ICD-10-CM | POA: Diagnosis not present

## 2019-07-07 DIAGNOSIS — R531 Weakness: Secondary | ICD-10-CM | POA: Insufficient documentation

## 2019-07-07 LAB — CBC WITH DIFFERENTIAL/PLATELET
Abs Immature Granulocytes: 0.01 10*3/uL (ref 0.00–0.07)
Basophils Absolute: 0 10*3/uL (ref 0.0–0.1)
Basophils Relative: 0 %
Eosinophils Absolute: 0.1 10*3/uL (ref 0.0–0.5)
Eosinophils Relative: 3 %
HCT: 39.3 % (ref 36.0–46.0)
Hemoglobin: 13.3 g/dL (ref 12.0–15.0)
Immature Granulocytes: 0 %
Lymphocytes Relative: 20 %
Lymphs Abs: 1 10*3/uL (ref 0.7–4.0)
MCH: 32.1 pg (ref 26.0–34.0)
MCHC: 33.8 g/dL (ref 30.0–36.0)
MCV: 94.9 fL (ref 80.0–100.0)
Monocytes Absolute: 0.4 10*3/uL (ref 0.1–1.0)
Monocytes Relative: 9 %
Neutro Abs: 3.2 10*3/uL (ref 1.7–7.7)
Neutrophils Relative %: 68 %
Platelets: 199 10*3/uL (ref 150–400)
RBC: 4.14 MIL/uL (ref 3.87–5.11)
RDW: 12.3 % (ref 11.5–15.5)
WBC: 4.8 10*3/uL (ref 4.0–10.5)
nRBC: 0 % (ref 0.0–0.2)

## 2019-07-07 LAB — COMPREHENSIVE METABOLIC PANEL
ALT: 14 U/L (ref 0–44)
AST: 18 U/L (ref 15–41)
Albumin: 3.8 g/dL (ref 3.5–5.0)
Alkaline Phosphatase: 80 U/L (ref 38–126)
Anion gap: 7 (ref 5–15)
BUN: 23 mg/dL (ref 8–23)
CO2: 27 mmol/L (ref 22–32)
Calcium: 9.6 mg/dL (ref 8.9–10.3)
Chloride: 101 mmol/L (ref 98–111)
Creatinine, Ser: 0.67 mg/dL (ref 0.44–1.00)
GFR calc Af Amer: >60 mL/min (ref >60)
GFR calc non Af Amer: >60 mL/min (ref >60)
Glucose, Bld: 80 mg/dL (ref 70–99)
Potassium: 4 mmol/L (ref 3.5–5.1)
Sodium: 135 mmol/L (ref 135–145)
Total Bilirubin: 0.3 mg/dL (ref 0.3–1.2)
Total Protein: 7 g/dL (ref 6.5–8.1)

## 2019-07-07 MED ORDER — DENOSUMAB 60 MG/ML ~~LOC~~ SOSY
60.0000 mg | PREFILLED_SYRINGE | Freq: Once | SUBCUTANEOUS | Status: AC
  Administered 2019-07-07: 60 mg via SUBCUTANEOUS

## 2019-07-07 NOTE — Patient Instructions (Signed)
Denosumab injection What is this medicine? DENOSUMAB (den oh sue mab) slows bone breakdown. Prolia is used to treat osteoporosis in women after menopause and in men, and in people who are taking corticosteroids for 6 months or more. Xgeva is used to treat a high calcium level due to cancer and to prevent bone fractures and other bone problems caused by multiple myeloma or cancer bone metastases. Xgeva is also used to treat giant cell tumor of the bone. This medicine may be used for other purposes; ask your health care provider or pharmacist if you have questions. COMMON BRAND NAME(S): Prolia, XGEVA What should I tell my health care provider before I take this medicine? They need to know if you have any of these conditions:  dental disease  having surgery or tooth extraction  infection  kidney disease  low levels of calcium or Vitamin D in the blood  malnutrition  on hemodialysis  skin conditions or sensitivity  thyroid or parathyroid disease  an unusual reaction to denosumab, other medicines, foods, dyes, or preservatives  pregnant or trying to get pregnant  breast-feeding How should I use this medicine? This medicine is for injection under the skin. It is given by a health care professional in a hospital or clinic setting. A special MedGuide will be given to you before each treatment. Be sure to read this information carefully each time. For Prolia, talk to your pediatrician regarding the use of this medicine in children. Special care may be needed. For Xgeva, talk to your pediatrician regarding the use of this medicine in children. While this drug may be prescribed for children as young as 13 years for selected conditions, precautions do apply. Overdosage: If you think you have taken too much of this medicine contact a poison control center or emergency room at once. NOTE: This medicine is only for you. Do not share this medicine with others. What if I miss a dose? It is  important not to miss your dose. Call your doctor or health care professional if you are unable to keep an appointment. What may interact with this medicine? Do not take this medicine with any of the following medications:  other medicines containing denosumab This medicine may also interact with the following medications:  medicines that lower your chance of fighting infection  steroid medicines like prednisone or cortisone This list may not describe all possible interactions. Give your health care provider a list of all the medicines, herbs, non-prescription drugs, or dietary supplements you use. Also tell them if you smoke, drink alcohol, or use illegal drugs. Some items may interact with your medicine. What should I watch for while using this medicine? Visit your doctor or health care professional for regular checks on your progress. Your doctor or health care professional may order blood tests and other tests to see how you are doing. Call your doctor or health care professional for advice if you get a fever, chills or sore throat, or other symptoms of a cold or flu. Do not treat yourself. This drug may decrease your body's ability to fight infection. Try to avoid being around people who are sick. You should make sure you get enough calcium and vitamin D while you are taking this medicine, unless your doctor tells you not to. Discuss the foods you eat and the vitamins you take with your health care professional. See your dentist regularly. Brush and floss your teeth as directed. Before you have any dental work done, tell your dentist you are   receiving this medicine. Do not become pregnant while taking this medicine or for 5 months after stopping it. Talk with your doctor or health care professional about your birth control options while taking this medicine. Women should inform their doctor if they wish to become pregnant or think they might be pregnant. There is a potential for serious side  effects to an unborn child. Talk to your health care professional or pharmacist for more information. What side effects may I notice from receiving this medicine? Side effects that you should report to your doctor or health care professional as soon as possible:  allergic reactions like skin rash, itching or hives, swelling of the face, lips, or tongue  bone pain  breathing problems  dizziness  jaw pain, especially after dental work  redness, blistering, peeling of the skin  signs and symptoms of infection like fever or chills; cough; sore throat; pain or trouble passing urine  signs of low calcium like fast heartbeat, muscle cramps or muscle pain; pain, tingling, numbness in the hands or feet; seizures  unusual bleeding or bruising  unusually weak or tired Side effects that usually do not require medical attention (report to your doctor or health care professional if they continue or are bothersome):  constipation  diarrhea  headache  joint pain  loss of appetite  muscle pain  runny nose  tiredness  upset stomach This list may not describe all possible side effects. Call your doctor for medical advice about side effects. You may report side effects to FDA at 1-800-FDA-1088. Where should I keep my medicine? This medicine is only given in a clinic, doctor's office, or other health care setting and will not be stored at home. NOTE: This sheet is a summary. It may not cover all possible information. If you have questions about this medicine, talk to your doctor, pharmacist, or health care provider.  2020 Elsevier/Gold Standard (2018-03-28 16:10:44)

## 2019-07-07 NOTE — Progress Notes (Signed)
No new changes noted today 

## 2019-07-08 LAB — CANCER ANTIGEN 27.29: CA 27.29: 18.1 U/mL (ref 0.0–38.6)

## 2019-07-09 ENCOUNTER — Ambulatory Visit (INDEPENDENT_AMBULATORY_CARE_PROVIDER_SITE_OTHER): Payer: Medicare Other

## 2019-07-09 DIAGNOSIS — Z7901 Long term (current) use of anticoagulants: Secondary | ICD-10-CM

## 2019-07-09 LAB — POCT INR: INR: 2.6 (ref 2.0–3.0)

## 2019-07-10 NOTE — Patient Instructions (Signed)
INR today: 2.6  Instructions given to [facility] nurse, Zenovia Jordan RN To continue taking same dose 7.5 mg daily EXCEPT for 3.75mg  on Mon and 5mg  on Friday.  Recheck in 4 weeks.

## 2019-07-13 ENCOUNTER — Ambulatory Visit (INDEPENDENT_AMBULATORY_CARE_PROVIDER_SITE_OTHER): Payer: Medicare Other | Admitting: Podiatry

## 2019-07-13 ENCOUNTER — Encounter: Payer: Self-pay | Admitting: Podiatry

## 2019-07-13 ENCOUNTER — Other Ambulatory Visit: Payer: Self-pay

## 2019-07-13 VITALS — Temp 97.2°F

## 2019-07-13 DIAGNOSIS — M79676 Pain in unspecified toe(s): Secondary | ICD-10-CM | POA: Diagnosis not present

## 2019-07-13 DIAGNOSIS — B351 Tinea unguium: Secondary | ICD-10-CM

## 2019-07-13 DIAGNOSIS — D689 Coagulation defect, unspecified: Secondary | ICD-10-CM | POA: Diagnosis not present

## 2019-07-13 NOTE — Progress Notes (Signed)
Complaint:  Visit Type: Patient returns to my office for continued preventative foot care services. Complaint: Patient states" my nails have grown long and thick and become painful to walk and wear shoes"  The patient presents for preventative foot care services. No changes to ROS.  Patient is taking coumadin.  Podiatric Exam: Vascular: dorsalis pedis and posterior tibial pulses are palpable bilateral. Capillary return is immediate. Temperature gradient is WNL. Skin turgor WNL  Sensorium: Normal Semmes Weinstein monofilament test. Normal tactile sensation bilaterally. Nail Exam: Pt has thick disfigured discolored nails with subungual debris noted bilateral entire nail hallux through fifth toenails Ulcer Exam: There is no evidence of ulcer or pre-ulcerative changes or infection. Orthopedic Exam: Muscle tone and strength are WNL. No limitations in general ROM. No crepitus or effusions noted. Foot type and digits show no abnormalities. Bony prominences are unremarkable. Skin: No Porokeratosis. No infection or ulcers  Diagnosis:  Onychomycosis, , Pain in right toe, pain in left toes  Treatment & Plan Procedures and Treatment: Consent by patient was obtained for treatment procedures. The patient understood the discussion of treatment and procedures well. All questions were answered thoroughly reviewed. Debridement of mycotic and hypertrophic toenails, 1 through 5 bilateral and clearing of subungual debris. No ulceration, no infection noted.   Return Visit-Office Procedure: Patient instructed to return to the office for a follow up visit 3 months for continued evaluation and treatment.    Gardiner Barefoot DPM

## 2019-07-27 ENCOUNTER — Other Ambulatory Visit: Payer: Self-pay | Admitting: Hematology and Oncology

## 2019-07-27 DIAGNOSIS — C50211 Malignant neoplasm of upper-inner quadrant of right female breast: Secondary | ICD-10-CM

## 2019-07-27 DIAGNOSIS — Z170 Estrogen receptor positive status [ER+]: Secondary | ICD-10-CM

## 2019-08-04 ENCOUNTER — Ambulatory Visit
Admission: RE | Admit: 2019-08-04 | Discharge: 2019-08-04 | Disposition: A | Discharge status: Home / Self Care | Payer: Medicare Other | Source: Ambulatory Visit | Attending: Hematology and Oncology | Admitting: Hematology and Oncology

## 2019-08-04 DIAGNOSIS — C50211 Malignant neoplasm of upper-inner quadrant of right female breast: Secondary | ICD-10-CM | POA: Insufficient documentation

## 2019-08-04 DIAGNOSIS — Z170 Estrogen receptor positive status [ER+]: Secondary | ICD-10-CM | POA: Diagnosis not present

## 2019-08-04 DIAGNOSIS — R928 Other abnormal and inconclusive findings on diagnostic imaging of breast: Secondary | ICD-10-CM | POA: Diagnosis not present

## 2019-08-04 HISTORY — DX: Personal history of irradiation: Z92.3

## 2019-08-05 ENCOUNTER — Ambulatory Visit (INDEPENDENT_AMBULATORY_CARE_PROVIDER_SITE_OTHER): Payer: Medicare Other

## 2019-08-05 DIAGNOSIS — Z23 Encounter for immunization: Secondary | ICD-10-CM | POA: Diagnosis not present

## 2019-08-06 DIAGNOSIS — Z7901 Long term (current) use of anticoagulants: Secondary | ICD-10-CM | POA: Diagnosis not present

## 2019-08-06 LAB — POCT INR: INR: 3.8 — AB (ref 2.0–3.0)

## 2019-08-11 ENCOUNTER — Ambulatory Visit (INDEPENDENT_AMBULATORY_CARE_PROVIDER_SITE_OTHER): Payer: BLUE CROSS/BLUE SHIELD

## 2019-08-11 DIAGNOSIS — Z7901 Long term (current) use of anticoagulants: Secondary | ICD-10-CM | POA: Diagnosis not present

## 2019-08-11 NOTE — Patient Instructions (Signed)
INR today: 3.8 *patient's lab was drawn on 9/3; however, I am just now today (9/8) receiving this elevated reading.   Instructions given to [facility] nurse, Zenovia Jordan RN and Denyce Robert, RN to communicate with patient.  Only medication change she has had has been letrozole.  She cannot remember or identify any other changes.  Letrozole really shouldn't interact with coumadin so I am not sure why she is so high today.  ?? Has she mixed up doses.   Will have her hold today (9/8) and decrease her dosing to 7.5mg  daily EXCEPT for 3.75mg  on Mon, Wed and Fri.  Recheck her in 2 weeks.

## 2019-08-17 DIAGNOSIS — H0011 Chalazion right upper eyelid: Secondary | ICD-10-CM | POA: Diagnosis not present

## 2019-08-19 ENCOUNTER — Other Ambulatory Visit: Payer: Self-pay

## 2019-08-20 ENCOUNTER — Encounter: Payer: Self-pay | Admitting: Radiation Oncology

## 2019-08-20 ENCOUNTER — Other Ambulatory Visit: Payer: Self-pay

## 2019-08-20 ENCOUNTER — Ambulatory Visit
Admission: RE | Admit: 2019-08-20 | Discharge: 2019-08-20 | Disposition: A | Discharge status: Home / Self Care | Payer: Medicare Other | Source: Ambulatory Visit | Attending: Radiation Oncology | Admitting: Radiation Oncology

## 2019-08-20 VITALS — BP 142/86 | HR 76 | Resp 16 | Wt 143.0 lb

## 2019-08-20 DIAGNOSIS — Z79811 Long term (current) use of aromatase inhibitors: Secondary | ICD-10-CM | POA: Insufficient documentation

## 2019-08-20 DIAGNOSIS — Z170 Estrogen receptor positive status [ER+]: Secondary | ICD-10-CM | POA: Diagnosis not present

## 2019-08-20 DIAGNOSIS — C50211 Malignant neoplasm of upper-inner quadrant of right female breast: Secondary | ICD-10-CM | POA: Diagnosis not present

## 2019-08-20 DIAGNOSIS — Z923 Personal history of irradiation: Secondary | ICD-10-CM | POA: Diagnosis not present

## 2019-08-20 NOTE — Progress Notes (Signed)
Radiation Oncology Follow up Note  Name: Becky Farrell   Date:   08/20/2019 MRN:  [id number] DOB: Feb 26, 1934    This 85 y.o. female presents to the clinic today for Patient is a 85 year old female now about 6 months having completed whole breast radiation to her right breast for stage I ER PR positive invasive mammary carcinoma.  Seen today in routine follow-up she is doing well.  She specifically denies breast tenderness cough or bone pain.  She is currently on letrozole tolerating that well without side effect.  She had mammograms this month which I have reviewed were BI-RADS 2 benign.  REFERRING PROVIDER: Jearld Fenton, NP  HPI: As above.  COMPLICATIONS OF TREATMENT: none  FOLLOW UP COMPLIANCE: keeps appointments   PHYSICAL EXAM:  BP (!) 142/86 (BP Location: Left Arm, Patient Position: Sitting)   Pulse 76   Resp 16   Wt 143 lb (64.9 kg)   BMI 23.80 kg/m  Lungs are clear to A&P cardiac examination essentially unremarkable with regular rate and rhythm. No dominant mass or nodularity is noted in either breast in 2 positions examined. Incision is well-healed. No axillary or supraclavicular adenopathy is appreciated. Cosmetic result is excellent.  Well-developed well-nourished patient in NAD. HEENT reveals PERLA, EOMI, discs not visualized.  Oral cavity is clear. No oral mucosal lesions are identified. Neck is clear without evidence of cervical or supraclavicular adenopathy. Lungs are clear to A&P. Cardiac examination is essentially unremarkable with regular rate and rhythm without murmur rub or thrill. Abdomen is benign with no organomegaly or masses noted. Motor sensory and DTR levels are equal and symmetric in the upper and lower extremities. Cranial nerves II through XII are grossly intact. Proprioception is intact. No peripheral adenopathy or edema is identified. No motor or sensory levels are noted. Crude visual fields are within normal range.  RADIOLOGY RESULTS: Mammograms reviewed  and compatible with above-stated findingsMammograms reviewed and compatible with above-stated findings  PLAN: At the present time patient is 6 months out doing well with no evidence of disease.  I am pleased with her overall progress.  She continues on letrozole without side effect.  I have asked to see her back in 6 months and then will start once a year follow-up visits.  Patient knows to call with any concerns.  I would like to take this opportunity to thank you for allowing me to participate in the care of your patient.Noreene Filbert, MD

## 2019-08-21 ENCOUNTER — Other Ambulatory Visit: Payer: Self-pay | Admitting: Hematology and Oncology

## 2019-08-21 DIAGNOSIS — Z170 Estrogen receptor positive status [ER+]: Secondary | ICD-10-CM

## 2019-08-21 DIAGNOSIS — C50211 Malignant neoplasm of upper-inner quadrant of right female breast: Secondary | ICD-10-CM

## 2019-08-24 DIAGNOSIS — Z5181 Encounter for therapeutic drug level monitoring: Secondary | ICD-10-CM | POA: Diagnosis not present

## 2019-08-25 ENCOUNTER — Ambulatory Visit (INDEPENDENT_AMBULATORY_CARE_PROVIDER_SITE_OTHER): Payer: Medicare Other

## 2019-08-25 DIAGNOSIS — Z7901 Long term (current) use of anticoagulants: Secondary | ICD-10-CM | POA: Diagnosis not present

## 2019-08-25 LAB — POCT INR: INR: 1.7 — AB (ref 2.0–3.0)

## 2019-08-25 NOTE — Patient Instructions (Signed)
INR today: 1.7   I LM on answering machine with dosing instructions for patient to give dosing instructions and also forwarded  Instructions to [facility] nurse, Zenovia Jordan RN to follow up with patient and review dosing below:   Will have her take 1.5 pills (11.25mg ) today 9/22 and then resume prior dosing of 7.5mg  daily EXCEPT for 3.75mg  on Mon, Wed and Fri.  Recheck her in 2 weeks.

## 2019-08-27 ENCOUNTER — Telehealth: Payer: Self-pay

## 2019-08-27 NOTE — Telephone Encounter (Signed)
I spoke directly with patient.  She confirms that she did receive the email finally from the [facility] nurse that detailed the dosing instructions and has no further questions at this time.

## 2019-08-27 NOTE — Telephone Encounter (Signed)
Pt left v/m Becky Farrell has been working with [facility] to adjust coumadin. Wants to talk with Becky Farrell only today. Pt has had 2 day delay that did not come thru on email.

## 2019-09-07 ENCOUNTER — Telehealth: Payer: Self-pay | Admitting: *Deleted

## 2019-09-07 NOTE — Telephone Encounter (Signed)
Patient notified as instructed by telephone and verbalized understanding. Patient stated that she will come for coumadin check tomorrow. Patient stated that she forgot to take her coumadin last night and needs to know what to do about that? Spoke to [place] and advised her of patient missing her coumadin dose last night. Was advised to call patient back and advise her to go ahead and take 7.5 mg tonight per [place]. Called patient and advised her of Mandy's instructions and she verbalized understanding.

## 2019-09-07 NOTE — Telephone Encounter (Signed)
Patient left a voicemail stating that she missed her appointment this morning at [facility] to have her coumadin checked. Patient stated that she can not believe that Manuela Schwartz at [facility] did not call her. Patient wants to know since she did not have her coumadin checked should she change her dose of coumadin?

## 2019-09-07 NOTE — Telephone Encounter (Signed)
Becky Farrell,    Can you please call patient and see if she can come to the office tomorrow for a drive up check with me at 1045 in the morning?  Give her carside instructions.    I apologize for the inconvenience but I need to check her asap and she can't get her blood drawn again until Thursday at [facility].   Tell her to stay on her same dosing that she has been on since I checked her last, she is to be taking 7.5mg  daily EXCEPT for 3.75mg  on Mon, Wed and Friday until I recheck her tomorrow.    Let me know if there are any problems with this.   Thanks.

## 2019-09-08 ENCOUNTER — Ambulatory Visit (INDEPENDENT_AMBULATORY_CARE_PROVIDER_SITE_OTHER): Payer: Medicare Other

## 2019-09-08 DIAGNOSIS — Z7901 Long term (current) use of anticoagulants: Secondary | ICD-10-CM | POA: Diagnosis not present

## 2019-09-08 LAB — POCT INR: INR: 1.6 — AB (ref 2.0–3.0)

## 2019-09-08 NOTE — Patient Instructions (Signed)
INR: 1.6  Patient took an extra 1/2 pill (7.5mg ) last night and extra 1/2 pill today (11.25mg ) and then increase weekly dose to taking 7.5mg  DAILY EXCEPT for 3.75mg  on Mondays and Fridays.  Recheck in 2 weeks at [facility].

## 2019-09-14 DIAGNOSIS — H0011 Chalazion right upper eyelid: Secondary | ICD-10-CM | POA: Diagnosis not present

## 2019-09-21 ENCOUNTER — Ambulatory Visit: Payer: Self-pay

## 2019-09-21 DIAGNOSIS — Z7901 Long term (current) use of anticoagulants: Secondary | ICD-10-CM

## 2019-09-21 LAB — POCT INR: INR: 2.3 (ref 2.0–3.0)

## 2019-09-21 NOTE — Patient Instructions (Signed)
INR: 2.3  Continue taking 7.5mg  DAILY EXCEPT for 0mg  on Wednesdays.  Recheck in 2 weeks at [facility] on 10/08/19.      Patient is doing well without any changes to diet, health or medications.  [facility] Nurse, Manuela Schwartz, is to call patient and communicate instructions and set up recheck appointment.

## 2019-09-25 DIAGNOSIS — Z170 Estrogen receptor positive status [ER+]: Secondary | ICD-10-CM | POA: Diagnosis not present

## 2019-09-25 DIAGNOSIS — C50411 Malignant neoplasm of upper-outer quadrant of right female breast: Secondary | ICD-10-CM | POA: Diagnosis not present

## 2019-09-30 NOTE — Progress Notes (Signed)
[facility]  [address] [place], [place] [address] Phone: [phone]  Fax: [phone]   Clinic Day:  10/06/2019  Referring physician: Jearld Fenton, NP  Chief Complaint: Becky Farrell is a 85 y.o. female with stage I right breast cancer who is seen for 3 month assessment.  HPI: The patient was last seen in the medical oncology clinic on 07/07/2019. At that time, she felt "fine". Dental work was complete. She noted some balance issues and slight dizziness which preceded initiation of Femara. Labs were normal. I spoke with her dentist and she was cleared and received Prolia.   Bilateral mammogram on 08/04/2019 revealed no evidence of malignancy in either breast. There were lumpectomy changes on the right.   She had a follow up with Dr. Baruch Gouty on 08/20/2019. She was doing well. She denied breast tenderness, cough, or bone pain. She was tolerating letrozole. He reviewed her mammogram results from 08/04/2019. She will follow up in 6 months.   During the interim, she says that overall she has been feeling well.  She had an episode of bruising on the right breast earlier in the week which is since resolved.  She questioned if related to a particularly vigorous hug from her husband.  Today, she denies tenderness, cough, or bone pain.  Tolerating letrozole well without significant side effects.  She does not perform self breast exams.  Has been trying to stay active during Covid but her exercise classes have been canceled.  She continues to reside at [facility].  Past Medical History:  Diagnosis Date  . Colon polyps   . Family history of breast cancer   . History of pulmonary embolism 2011  . Hx of melanoma of skin 2003   Right leg  . Hypertension   . Melanoma ([place]) 2003  . Personal history of radiation therapy   . Seizures ([place]) 1993   no meds since (approx) 2014  . Vertigo    x1. R/T perforated ear drum prior to 2013    Past Surgical  History:  Procedure Laterality Date  . ABDOMINAL HYSTERECTOMY  05/2014  . BREAST BIOPSY Right 08/20/2018   invasive mammary carcinoma/DCIS   . BREAST LUMPECTOMY Right 10/10/2018  . BREAST LUMPECTOMY WITH RADIOACTIVE SEED LOCALIZATION Right 10/10/2018   Procedure: BREAST LUMPECTOMY WITH RADIOACTIVE SEED LOCALIZATION;  Surgeon: Jovita Kussmaul, MD;  Location: [facility];  Service: General;  Laterality: Right;  . CATARACT EXTRACTION W/PHACO Right 01/06/2018   Procedure: CATARACT EXTRACTION PHACO AND INTRAOCULAR LENS PLACEMENT ([place]) RIGHT;  Surgeon: Leandrew Koyanagi, MD;  Location: [facility];  Service: Ophthalmology;  Laterality: Right;  . CATARACT EXTRACTION W/PHACO Left 01/29/2018   Procedure: CATARACT EXTRACTION PHACO AND INTRAOCULAR LENS PLACEMENT ([place]) LEFT;  Surgeon: Leandrew Koyanagi, MD;  Location: [facility];  Service: Ophthalmology;  Laterality: Left;  . left hand surgery  05/2011   s/p fall  . OTHER SURGICAL HISTORY  2003   Removal of lymphoma from melonoma 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     Family History  Problem Relation Age of Onset  . Heart disease Mother   . Aortic aneurysm Father   . Breast cancer Daughter 99  . Breast cancer Niece 49    Social History:  reports that she has never smoked. She has never used smokeless tobacco. She reports that she does not drink alcohol or use drugs. The patient recently moved from [place] to [place]. Her daughter and grandchildren live in [place]. She has  other children in [place]. Her husband has been recovering from prostate cancer. She is a retired Pharmacist, hospital. The patient is unaccompanied today.  Allergies:  Allergies  Allergen Reactions  . Clobetasol Other (See Comments)    vertigo  . Clobetasol Propionate Other (See Comments)    This is ointment form UNSPECIFIED REACTION   . Phenergan [Promethazine Hcl] Other (See Comments)    Rapid heart rate  . Dextromethorphan Palpitations and Other (See Comments)     promethazine with DM, rapid hearbeat  . Zithromax [Azithromycin] Palpitations and Other (See Comments)    Per OSH report, pt unable to recall Rapid heart rate    Current Medications: Current Outpatient Medications  Medication Sig Dispense Refill  . aspirin 81 MG tablet Take 81 mg by mouth daily.    Marland Kitchen atenolol (TENORMIN) 25 MG tablet TAKE 1 TABLET BY MOUTH EVERY DAY 90 tablet 1  . Cholecalciferol (VITAMIN D3) 2000 UNITS TABS Take 2,000 Units by mouth daily.     . hydrochlorothiazide (MICROZIDE) 12.5 MG capsule TAKE 1 CAPSULE BY MOUTH EVERY DAY 90 capsule 1  . Iodoquinol-HC-Aloe Polysacch (ALCORTIN A) 1-2-1 % GEL Apply topically as needed (for rash).    Marland Kitchen letrozole (FEMARA) 2.5 MG tablet TAKE 1 TABLET BY MOUTH EVERY DAY 30 tablet 2  . lisinopril (ZESTRIL) 10 MG tablet TAKE 1 TABLET BY MOUTH EVERY DAY 90 tablet 1  . polyethylene glycol (MIRALAX / GLYCOLAX) packet Take 17 g by mouth every other day.    . warfarin (COUMADIN) 5 MG tablet TAKE 1 TAB BY MOUTH DAILY ON FRIDAYS, 1/3 TABS ON MONDAYS, 1.5 TABS DAILY ON ALL OTHER DAYS. 90 tablet 0  . warfarin (COUMADIN) 7.5 MG tablet TAKE 1 TABLET EVERY DAY AND AS DIRECTED 70 tablet 1  . Wheat Dextrin (BENEFIBER PO) Take 2 each by mouth See admin instructions. Mix 2 teaspoons and drink 3 times daily     No current facility-administered medications for this visit.     Review of Systems  Constitutional: Negative for chills, diaphoresis, fever, malaise/fatigue and weight loss.  HENT: Positive for hearing loss. Negative for congestion, ear discharge, ear pain, nosebleeds, sinus pain and sore throat.        No dental issues (cleared for Prolia by dentist).  Eyes: Negative.  Negative for blurred vision, double vision and photophobia.  Respiratory: Negative.  Negative for cough, hemoptysis, sputum production and shortness of breath.        Recurrent PE x 2.  Cardiovascular: Negative.  Negative for chest pain, palpitations, orthopnea, leg swelling and PND.   Gastrointestinal: Negative.  Negative for abdominal pain, blood in stool, constipation, diarrhea, heartburn, melena, nausea and vomiting.  Genitourinary: Negative.  Negative for dysuria, frequency, hematuria and urgency.  Musculoskeletal: Negative for back pain, falls, joint pain, myalgias and neck pain.       Osteoporosis.  Skin: Negative.  Negative for itching and rash.  Neurological: Positive for dizziness. Negative for tremors, sensory change, speech change, focal weakness, seizures (history), weakness and headaches.       Chronic balance issues.  Endo/Heme/Allergies: Bruises/bleeds easily (on Coumadin).  Psychiatric/Behavioral: Negative.  Negative for depression and memory loss. The patient is not nervous/anxious and does not have insomnia.   All other systems reviewed and are negative.   Performance status (ECOG): 1  Vitals Blood pressure (!) 168/77, pulse 63, temperature (!) 97.4 F (36.3 C), temperature source Tympanic, resp. rate 18, height '5\' 5"'$  (1.651 m), weight 146 lb 0.9 oz (66.3 kg), SpO2  99 %.   Physical Exam  Constitutional: She is oriented to person, place, and time. She appears well-developed and well-nourished. No distress.  Appears stated age.  Unaccompanied.  Wearing mask.  HENT:  Head: Normocephalic and atraumatic.  Mouth/Throat: Oropharynx is clear and moist. No oropharyngeal exudate.  Short gray hair.  Mask.  Eyes: Conjunctivae and EOM are normal. No scleral icterus.  Glasses. Blue eyes.  Neck: Normal range of motion. Neck supple. No JVD present.  Cardiovascular: Normal rate, regular rhythm and normal heart sounds.  No murmur heard. Pulmonary/Chest: Effort normal and breath sounds normal. No respiratory distress. She has no wheezes. She has no rales. She exhibits no mass and no tenderness. Right breast exhibits no inverted nipple, no mass, no nipple discharge, no skin change and no tenderness. Left breast exhibits no inverted nipple, no mass, no nipple  discharge, no skin change and no tenderness. Breasts are asymmetrical (Lumpectomy & radiation changes).  No discoloration or bruising noted  Abdominal: Soft. Bowel sounds are normal. She exhibits no distension and no mass. There is no abdominal tenderness. There is no rebound and no guarding.  Musculoskeletal: Normal range of motion.        General: No tenderness or edema.  Lymphadenopathy:    She has no cervical adenopathy.    She has no axillary adenopathy.       Right: No supraclavicular adenopathy present.       Left: No supraclavicular adenopathy present.  Neurological: She is alert and oriented to person, place, and time.  Skin: Skin is warm and dry. No rash noted. She is not diaphoretic. No erythema. No pallor.  Psychiatric: She has a normal mood and affect. Her behavior is normal. Judgment and thought content normal.  Nursing note and vitals reviewed.   Appointment on 10/06/2019  Component Date Value Ref Range Status  . CA 27.29 10/06/2019 19.3  0.0 - 38.6 U/mL Final   Comment: (NOTE) Siemens Centaur Immunochemiluminometric Methodology [facility]) Values obtained with different assay methods or kits cannot be used interchangeably. Results cannot be interpreted as absolute evidence of the presence or absence of malignant disease. Performed At: [facility] [place], [place] [id number] Rush Farmer MD OZ:[id number]   . Sodium 10/06/2019 137  135 - 145 mmol/L Final  . Potassium 10/06/2019 3.7  3.5 - 5.1 mmol/L Final  . Chloride 10/06/2019 102  98 - 111 mmol/L Final  . CO2 10/06/2019 26  22 - 32 mmol/L Final  . Glucose, Bld 10/06/2019 79  70 - 99 mg/dL Final  . BUN 10/06/2019 22  8 - 23 mg/dL Final  . Creatinine, Ser 10/06/2019 0.74  0.44 - 1.00 mg/dL Final  . Calcium 10/06/2019 9.6  8.9 - 10.3 mg/dL Final  . Total Protein 10/06/2019 7.0  6.5 - 8.1 g/dL Final  . Albumin 10/06/2019 4.0  3.5 - 5.0 g/dL Final  . AST 10/06/2019 20  15 - 41 U/L Final  . ALT  10/06/2019 15  0 - 44 U/L Final  . Alkaline Phosphatase 10/06/2019 62  38 - 126 U/L Final  . Total Bilirubin 10/06/2019 0.4  0.3 - 1.2 mg/dL Final  . GFR calc non Af Amer 10/06/2019 >60  >60 mL/min Final  . GFR calc Af Amer 10/06/2019 >60  >60 mL/min Final  . Anion gap 10/06/2019 9  5 - 15 Final   Performed at [facility], [address], [place], [place] [address]  . WBC 10/06/2019 4.0  4.0 -  10.5 K/uL Final  . RBC 10/06/2019 4.10  3.87 - 5.11 MIL/uL Final  . Hemoglobin 10/06/2019 13.1  12.0 - 15.0 g/dL Final  . HCT 10/06/2019 39.2  36.0 - 46.0 % Final  . MCV 10/06/2019 95.6  80.0 - 100.0 fL Final  . MCH 10/06/2019 32.0  26.0 - 34.0 pg Final  . MCHC 10/06/2019 33.4  30.0 - 36.0 g/dL Final  . RDW 10/06/2019 12.5  11.5 - 15.5 % Final  . Platelets 10/06/2019 172  150 - 400 K/uL Final  . nRBC 10/06/2019 0.0  0.0 - 0.2 % Final  . Neutrophils Relative % 10/06/2019 60  % Final  . Neutro Abs 10/06/2019 2.5  1.7 - 7.7 K/uL Final  . Lymphocytes Relative 10/06/2019 21  % Final  . Lymphs Abs 10/06/2019 0.8  0.7 - 4.0 K/uL Final  . Monocytes Relative 10/06/2019 12  % Final  . Monocytes Absolute 10/06/2019 0.5  0.1 - 1.0 K/uL Final  . Eosinophils Relative 10/06/2019 5  % Final  . Eosinophils Absolute 10/06/2019 0.2  0.0 - 0.5 K/uL Final  . Basophils Relative 10/06/2019 1  % Final  . Basophils Absolute 10/06/2019 0.0  0.0 - 0.1 K/uL Final  . Immature Granulocytes 10/06/2019 1  % Final  . Abs Immature Granulocytes 10/06/2019 0.02  0.00 - 0.07 K/uL Final   Performed at [facility], [address], [place], [place] [address]    Assessment:  Becky Farrell is a 85 y.o. female with stage T1aNx right breast cancers/p lumpectomy on 10/10/2018. Pathology revealed a 0.4 cm grade I invasive ductal carcinoma with mucinous features. There was intermediate grade DCIS. Margins were uninvolved (1.0 cm posterior margin). There were fibrocystic changes including apocrine metaplasia.  Tumor was ER + (100%), PR + (90%), and Her2/neu-. Ki67 was 10%. Pathologic stagewas T1aNx. CA27.29was 14.1 on 09/08/2018.  Bilateral screening mammogramon 07/31/2018 revealed a possible mass in the right breast. Right diagnostic mammogramand targeted ultrasound on 08/13/2018 revealed a 4 x 5 x 8 mm indeterminate mass at the 10 o'clock position 9 cm from the nipple in the right breast.  Shereceivedradiationfrom 12/16/2018- 02/03/2019.She received5040cGyin 28 fractions with a scar boost of another 1400 cGyusing electron beam.  She began Femara on 06/09/2019.  CA27.29 has been followed: 14.1 on 09/08/2018 and 14.4 on 04/09/2019.  Bone densityon 07/31/2018 revealed osteoporosiswith a T-score of -3.4 in the right forearm radius and -1.9 in the right femoral neck.  She has a history of pulmonary embolismx 2. Initial event occurred in 2011. She had a recurrent pulmonary embolism after holding Coumadin for 1 month for a planned colonoscopy. She is on life long anticoagulation (Coumadin). She has afamily historyof breast cancer (daughter, age 29, and niece). [employer] genetic testingwas negative.  Symptomatically, patient doing well and clinically no evidence of recurrence of cancer today.  Provided education regarding self breast exams.  Chronic gait and balance issues which are no worse.  Tolerating Femara well without significant side effects.  Plan: 1.   Labs today: CBC with diff, CMP, CA27.29.  2.Stage T1aNx right breast cancer She is status post surgery/lumpectomy and radiation. Currently on Femara since 06/09/2019.  Discussed recommendation for 5 years of therapy, estimated to complete in July 2025. Tolerating well without significant side effects. Bilateral diagnostic mammogram on 08/04/2019 reported as BI-RADS Category 2: Benign.  Recommend repeating in September 2021. CA 27-29- 19.3. Slightly up-trending. Clinically no evidence of disease.  Continue Femara.   3. Osteoporosis T score -3.4 consistent  with osteoporosis prior to initiating antihormonal therapy On calcium and vitamin D She received clearance from her dentist to initiate Prolia Started Prolia on 07/07/2019 Encouraged weightbearing exercise.  Offered referral to PT/OTfor balance which she declined.  Can reconsider in the future Next Prolia due February 2021. Continue calcium and vitamin D supplementation Plan to repeat bone density in 07/22/2020  Disposition: Return to clinic in 3 months for labs (CBC, CMP, CA 27-29) and MD assessment and Prolia Continue follow-up with radiation oncology as scheduled.  I discussed the assessment and treatment plan with the patient.  The patient was provided an opportunity to ask questions and all were answered.  The patient agreed with the plan and demonstrated an understanding of the instructions.  The patient was advised to call back if the symptoms worsen or if the condition fails to improve as anticipated.  I provided 25 minutes of face-to-face time during this this encounter and > 50% was spent counseling as documented under my assessment and plan.   [name], DNP, AGNP-C [place] at [facility] [phone] (clinic)  CC: Dr. Mike Gip

## 2019-10-05 NOTE — Progress Notes (Signed)
Confirmed Name, DOB, and Address. Denies any concerns.  

## 2019-10-06 ENCOUNTER — Other Ambulatory Visit: Payer: Self-pay

## 2019-10-06 ENCOUNTER — Inpatient Hospital Stay: Payer: Medicare Other

## 2019-10-06 ENCOUNTER — Encounter: Payer: Self-pay | Admitting: Nurse Practitioner

## 2019-10-06 ENCOUNTER — Inpatient Hospital Stay: Payer: Medicare Other | Attending: Hematology and Oncology | Admitting: Nurse Practitioner

## 2019-10-06 VITALS — BP 168/77 | HR 63 | Temp 97.4°F | Resp 18 | Ht 65.0 in | Wt 146.1 lb

## 2019-10-06 DIAGNOSIS — Z170 Estrogen receptor positive status [ER+]: Secondary | ICD-10-CM

## 2019-10-06 DIAGNOSIS — C50911 Malignant neoplasm of unspecified site of right female breast: Secondary | ICD-10-CM | POA: Diagnosis not present

## 2019-10-06 DIAGNOSIS — C50211 Malignant neoplasm of upper-inner quadrant of right female breast: Secondary | ICD-10-CM | POA: Diagnosis not present

## 2019-10-06 DIAGNOSIS — Z79899 Other long term (current) drug therapy: Secondary | ICD-10-CM | POA: Insufficient documentation

## 2019-10-06 DIAGNOSIS — Z7901 Long term (current) use of anticoagulants: Secondary | ICD-10-CM | POA: Insufficient documentation

## 2019-10-06 DIAGNOSIS — Z8249 Family history of ischemic heart disease and other diseases of the circulatory system: Secondary | ICD-10-CM | POA: Diagnosis not present

## 2019-10-06 DIAGNOSIS — M810 Age-related osteoporosis without current pathological fracture: Secondary | ICD-10-CM | POA: Diagnosis not present

## 2019-10-06 DIAGNOSIS — Z79811 Long term (current) use of aromatase inhibitors: Secondary | ICD-10-CM | POA: Diagnosis not present

## 2019-10-06 DIAGNOSIS — Z803 Family history of malignant neoplasm of breast: Secondary | ICD-10-CM | POA: Diagnosis not present

## 2019-10-06 DIAGNOSIS — Z86711 Personal history of pulmonary embolism: Secondary | ICD-10-CM | POA: Diagnosis not present

## 2019-10-06 DIAGNOSIS — Z7982 Long term (current) use of aspirin: Secondary | ICD-10-CM | POA: Diagnosis not present

## 2019-10-06 LAB — CBC WITH DIFFERENTIAL/PLATELET
Abs Immature Granulocytes: 0.02 10*3/uL (ref 0.00–0.07)
Basophils Absolute: 0 10*3/uL (ref 0.0–0.1)
Basophils Relative: 1 %
Eosinophils Absolute: 0.2 10*3/uL (ref 0.0–0.5)
Eosinophils Relative: 5 %
HCT: 39.2 % (ref 36.0–46.0)
Hemoglobin: 13.1 g/dL (ref 12.0–15.0)
Immature Granulocytes: 1 %
Lymphocytes Relative: 21 %
Lymphs Abs: 0.8 10*3/uL (ref 0.7–4.0)
MCH: 32 pg (ref 26.0–34.0)
MCHC: 33.4 g/dL (ref 30.0–36.0)
MCV: 95.6 fL (ref 80.0–100.0)
Monocytes Absolute: 0.5 10*3/uL (ref 0.1–1.0)
Monocytes Relative: 12 %
Neutro Abs: 2.5 10*3/uL (ref 1.7–7.7)
Neutrophils Relative %: 60 %
Platelets: 172 10*3/uL (ref 150–400)
RBC: 4.1 MIL/uL (ref 3.87–5.11)
RDW: 12.5 % (ref 11.5–15.5)
WBC: 4 10*3/uL (ref 4.0–10.5)
nRBC: 0 % (ref 0.0–0.2)

## 2019-10-06 LAB — COMPREHENSIVE METABOLIC PANEL
ALT: 15 U/L (ref 0–44)
AST: 20 U/L (ref 15–41)
Albumin: 4 g/dL (ref 3.5–5.0)
Alkaline Phosphatase: 62 U/L (ref 38–126)
Anion gap: 9 (ref 5–15)
BUN: 22 mg/dL (ref 8–23)
CO2: 26 mmol/L (ref 22–32)
Calcium: 9.6 mg/dL (ref 8.9–10.3)
Chloride: 102 mmol/L (ref 98–111)
Creatinine, Ser: 0.74 mg/dL (ref 0.44–1.00)
GFR calc Af Amer: >60 mL/min (ref >60)
GFR calc non Af Amer: >60 mL/min (ref >60)
Glucose, Bld: 79 mg/dL (ref 70–99)
Potassium: 3.7 mmol/L (ref 3.5–5.1)
Sodium: 137 mmol/L (ref 135–145)
Total Bilirubin: 0.4 mg/dL (ref 0.3–1.2)
Total Protein: 7 g/dL (ref 6.5–8.1)

## 2019-10-06 MED ORDER — LETROZOLE 2.5 MG PO TABS: 2.5000 mg | ORAL_TABLET | Freq: Every day | ORAL | 3 refills | Status: DC

## 2019-10-06 NOTE — Patient Instructions (Signed)
Breast Cancer Survivor Follow-up The goal of treatment for breast cancer is to get rid of all cancer cells in the body, but sometimes a few cells remain. These cells can grow and cause the cancer to return (recur) later. If this happens, the goal is to find the cancer as soon as possible. Cancer can recur just a few months after treatment or years later. Most cases of recurrent breast cancer develop within 5 years after treatment. Will my cancer return? There is no way to know if your breast cancer will return. However, your chance of developing recurrent breast cancer is greater if you had:  Breast cancer before 83 years of age.  Breast cancer that spread to the lymph nodes.  A tumor that was bigger than 2 inches (5 cm).  A high-grade tumor. These are tumors that have cells that grow more quickly than other types of tumors.  A close tumor margin. This means that the space between the tumor and normal, noncancerous cells was small.  Inflammatory breast cancer. This is an aggressive form of cancer in which cancer cells block the lymph vessels in the skin of the breast.  Human epidermal growth factor (HER2) positive breast cancer. This is a type of cancer that grows as a result of the HER2 protein.  Surgery to remove the tumor but not the entire breast (lumpectomy) without radiation therapy. What are the symptoms of recurrent breast cancer? Examine your breasts every month. You may find it helpful to do this on the same day each month. Mark your calendar as a reminder. Let your health care provider know immediately if you have any signs or symptoms of recurrent breast cancer. Signs and symptoms of recurrent breast cancer vary. Symptoms will depend on where the cancer is and how the original cancer was treated. Recurrence in the same spot or the opposite breast Symptoms of a cancer that comes back in the same spot (local recurrence) after a lumpectomy, or a recurrence in the opposite breast, may  include:  A new lump or thickening in the breast.  A change in the way that the skin of the breast looks, such as a rash, dimpling, or wrinkling.  Redness or swelling of the breast.  Changes in the nipple. It may be leaking fluid, or it may be red, puckered, or swollen. Recurrence after a mastectomy Symptoms of a recurrence after breast removal surgery (mastectomy) may include:  A lump or thickening under the skin.  A thickening around the mastectomy scar. Recurrence in the lymph nodes Symptoms of a cancer that comes back in the lymph nodes near the breast (regional recurrence) may include:  A lump under the arm or above the collarbone.  Swelling of the arm.  Pain in the arm, shoulder, or chest.  Numbness in the hand or arm. Recurrence in a different part of the body Symptoms of cancer that comes back in an area of the body far away from the original cancer site (distant recurrence) may include:  A cough that does not go away.  Trouble breathing or shortness of breath.  Pain in the bones, the spine, or the chest. This is pain that lasts or does not improve with rest and medicine.  Headaches.  Sudden vision problems.  Dizziness.  Nausea or vomiting.  Weight loss.  Abdominal pain that does not go away.  Yellowing of the skin or eyes (jaundice).  Blood in the urine or bloody vaginal discharge. Following up with your health care provider Most  people continue to see their cancer specialist (oncologist) every 3-6 months for the first year after cancer treatment. Ask about your cancer survivor plan of care that outlines your follow-up care after treatment ends. See your primary care provider for regular checkups during this time.  Ask your oncologist: ? How often you should have follow-up visits. ? What symptoms to watch for. ? What to do and whom to call about any symptoms. ? What tests should be done.  Keep a schedule of appointments for the tests and exams that  you need, including physical exams, breast exams, and exams of the lymph nodes.  For the first 3 years after being treated for breast cancer, see your health care provider every 3-6 months.  In the fourth and fifth years after being treated for breast cancer, see your health care provider every 6-12 months.  Starting at 5 years after your breast cancer treatment, see your health care provider at least once a year.  Continue to have regular breast X-rays (mammograms), even if you had a mastectomy. ? Get a mammogram 1 year after the mammogram that first detected breast cancer. ? Get a mammogram every 6-12 months after that or as often as your health care provider suggests.  Have a pelvic exam every year or as often as your health care provider suggests.  Have other cancer screening tests as recommended, such as skin cancer screening and colorectal cancer screening.  Some tests are not recommended for routine screening. Most people recovering from breast cancer do not need to have these tests if there are no problems. The tests have risks, such as radiation exposure, and can be costly. The risks of some of the following tests are thought to be greater than the benefits: ? Blood tests. ? Chest X-rays. ? Bone scans. ? Liver ultrasound. ? CT scan. ? MRI. ? Positron emission tomography (PET scan). Where to find more information  American Cancer Society: www.cancer.[place]: www.cancer.gov Contact a health care provider if:  You have any signs or symptoms of recurrent breast cancer.  You are taking a medicine prescribed to treat your breast cancer and you have vaginal bleeding.  You discover new lumps or changes in your breast.  You have headaches or have pain in your bones, spine, chest, or abdomen.  You have shortness of breath.  You have a cough that does not go away.  You have discharge from your nipple.  You have a rash on your breast. Get help right  away if:  You have trouble breathing.  You have chest pain. Summary  Most cases of recurrent breast cancer develop within 5 years after treatment.  Examine your breasts every month. Let your health care provider know immediately if you have any signs or symptoms of recurrent breast cancer.  Keep a schedule of appointments for the tests and exams that you need, including physical exams, breast exams, and exams of the lymph nodes.  Continue to have regular breast X-rays (mammograms), even if you had a mastectomy. This information is not intended to replace advice given to you by your health care provider. Make sure you discuss any questions you have with your health care provider. Document Released: 07/18/2011 Document Revised: 11/01/2017 Document Reviewed: 11/08/2016 Elsevier Patient Education  2020 [employer].

## 2019-10-07 LAB — CANCER ANTIGEN 27.29: CA 27.29: 19.3 U/mL (ref 0.0–38.6)

## 2019-10-08 ENCOUNTER — Ambulatory Visit (INDEPENDENT_AMBULATORY_CARE_PROVIDER_SITE_OTHER): Payer: Medicare Other

## 2019-10-08 DIAGNOSIS — Z7901 Long term (current) use of anticoagulants: Secondary | ICD-10-CM

## 2019-10-08 LAB — POCT INR: INR: 2.3 (ref 2.0–3.0)

## 2019-10-13 NOTE — Patient Instructions (Signed)
INR: 2.3 *email report received from Patent examiner at [facility].  Responded via email to [employer] on 11/6 witht he following instructions:   Continue taking 7.5mg  DAILY EXCEPT for 0mg  on Wednesdays.  Recheck in 4 weeks at [facility].     Patient is doing well without any changes to diet, health or medications.  [facility] Nurse, [employer], is to call patient and communicate instructions and set up recheck appointment.

## 2019-10-19 ENCOUNTER — Other Ambulatory Visit: Payer: Self-pay

## 2019-10-19 ENCOUNTER — Ambulatory Visit (INDEPENDENT_AMBULATORY_CARE_PROVIDER_SITE_OTHER): Payer: Medicare Other | Admitting: Podiatry

## 2019-10-19 ENCOUNTER — Encounter: Payer: Self-pay | Admitting: Podiatry

## 2019-10-19 DIAGNOSIS — M79609 Pain in unspecified limb: Secondary | ICD-10-CM | POA: Diagnosis not present

## 2019-10-19 DIAGNOSIS — B351 Tinea unguium: Secondary | ICD-10-CM

## 2019-10-19 DIAGNOSIS — D689 Coagulation defect, unspecified: Secondary | ICD-10-CM

## 2019-10-19 NOTE — Progress Notes (Signed)
Complaint:  Visit Type: Patient returns to my office for continued preventative foot care services. Complaint: Patient states" my nails have grown long and thick and become painful to walk and wear shoes"  The patient presents for preventative foot care services. No changes to ROS.  Patient is taking coumadin.  Podiatric Exam: Vascular: dorsalis pedis and posterior tibial pulses are palpable bilateral. Capillary return is immediate. Temperature gradient is WNL. Skin turgor WNL  Sensorium: Normal Semmes Weinstein monofilament test. Normal tactile sensation bilaterally. Nail Exam: Pt has thick disfigured discolored nails with subungual debris noted bilateral entire nail hallux through fifth toenails Ulcer Exam: There is no evidence of ulcer or pre-ulcerative changes or infection. Orthopedic Exam: Muscle tone and strength are WNL. No limitations in general ROM. No crepitus or effusions noted. Foot type and digits show no abnormalities. Bony prominences are unremarkable. Skin: No Porokeratosis. No infection or ulcers  Diagnosis:  Onychomycosis, , Pain in right toe, pain in left toes  Treatment & Plan Procedures and Treatment: Consent by patient was obtained for treatment procedures. The patient understood the discussion of treatment and procedures well. All questions were answered thoroughly reviewed. Debridement of mycotic and hypertrophic toenails, 1 through 5 bilateral and clearing of subungual debris. No ulceration, no infection noted.  Return Visit-Office Procedure: Patient instructed to return to the office for a follow up visit 3 months for continued evaluation and treatment.    Leighanne Adolph DPM 

## 2019-11-03 ENCOUNTER — Other Ambulatory Visit: Payer: Self-pay | Admitting: Internal Medicine

## 2019-11-06 ENCOUNTER — Ambulatory Visit (INDEPENDENT_AMBULATORY_CARE_PROVIDER_SITE_OTHER): Payer: Medicare Other | Admitting: General Practice

## 2019-11-06 DIAGNOSIS — Z7901 Long term (current) use of anticoagulants: Secondary | ICD-10-CM

## 2019-11-06 LAB — PROTIME-INR: INR: 2.2 — AB (ref <=1.1)

## 2019-11-06 NOTE — Patient Instructions (Signed)
Pre visit review using our clinic review tool, if applicable. No additional management support is needed unless otherwise documented below in the visit note.  NR: 2.2 *email report received from Patent examiner at [facility].  Responded via email to [employer] on 12/4  With  the following instructions:  NR: 2.2 *email report received from Patent examiner at [facility].  Responded via email to [employer] on 12/4  With  the following instructions:   Continue taking 7.5mg  DAILY EXCEPT for 0mg  on Wednesdays.  Recheck in 4 weeks at [facility].

## 2019-11-06 NOTE — Progress Notes (Signed)
Medical screening examination/treatment/procedure(s) were performed by non-physician practitioner and as supervising physician I was immediately available for consultation/collaboration. I agree with above. James John, MD   

## 2019-11-22 ENCOUNTER — Other Ambulatory Visit: Payer: Self-pay | Admitting: Internal Medicine

## 2019-11-28 ENCOUNTER — Other Ambulatory Visit: Payer: Self-pay | Admitting: Internal Medicine

## 2019-12-07 DIAGNOSIS — Z7901 Long term (current) use of anticoagulants: Secondary | ICD-10-CM | POA: Diagnosis not present

## 2019-12-07 LAB — POCT INR: INR: 2.3 (ref 2.0–3.0)

## 2019-12-08 ENCOUNTER — Ambulatory Visit (INDEPENDENT_AMBULATORY_CARE_PROVIDER_SITE_OTHER): Payer: Medicare Other

## 2019-12-08 DIAGNOSIS — Z7901 Long term (current) use of anticoagulants: Secondary | ICD-10-CM

## 2019-12-08 NOTE — Progress Notes (Signed)
Patient notified as instructed by telephone and verbalized understanding. 

## 2019-12-08 NOTE — Patient Instructions (Signed)
INR: 2.3 *email report received from Waldemar Dickens, [place] at [facility]  Patient notified by Emelia Salisbury, LPN to  Continue taking 7.5mg  DAILY EXCEPT for 0mg  on Wednesdays.  Recheck in 4 weeks at [facility]     Patient is doing well without any changes to diet, health or medications.  [facility] to email next lab result in 4 weeks to coumadin clinic for management.

## 2019-12-10 ENCOUNTER — Ambulatory Visit: Payer: Medicare Other

## 2019-12-17 DIAGNOSIS — H7202 Central perforation of tympanic membrane, left ear: Secondary | ICD-10-CM | POA: Diagnosis not present

## 2019-12-17 DIAGNOSIS — H6123 Impacted cerumen, bilateral: Secondary | ICD-10-CM | POA: Diagnosis not present

## 2019-12-17 DIAGNOSIS — Z23 Encounter for immunization: Secondary | ICD-10-CM | POA: Diagnosis not present

## 2019-12-23 ENCOUNTER — Encounter: Payer: Self-pay | Admitting: *Deleted

## 2019-12-27 ENCOUNTER — Other Ambulatory Visit: Payer: Self-pay | Admitting: Internal Medicine

## 2019-12-27 DIAGNOSIS — Z7901 Long term (current) use of anticoagulants: Secondary | ICD-10-CM

## 2019-12-29 NOTE — Telephone Encounter (Signed)
Request for coumadin refill. Pt is in compliance with coumadin clinic. Refill sent.

## 2020-01-01 ENCOUNTER — Other Ambulatory Visit: Payer: Self-pay | Admitting: Internal Medicine

## 2020-01-02 ENCOUNTER — Other Ambulatory Visit: Payer: Self-pay | Admitting: Internal Medicine

## 2020-01-02 DIAGNOSIS — Z7901 Long term (current) use of anticoagulants: Secondary | ICD-10-CM

## 2020-01-04 ENCOUNTER — Other Ambulatory Visit: Payer: Self-pay

## 2020-01-04 ENCOUNTER — Ambulatory Visit (INDEPENDENT_AMBULATORY_CARE_PROVIDER_SITE_OTHER): Payer: Medicare Other

## 2020-01-04 DIAGNOSIS — Z86711 Personal history of pulmonary embolism: Secondary | ICD-10-CM | POA: Diagnosis not present

## 2020-01-04 DIAGNOSIS — Z7901 Long term (current) use of anticoagulants: Secondary | ICD-10-CM

## 2020-01-04 DIAGNOSIS — Z86718 Personal history of other venous thrombosis and embolism: Secondary | ICD-10-CM | POA: Diagnosis not present

## 2020-01-04 LAB — POCT INR: INR: 3.5 — AB (ref 2.0–3.0)

## 2020-01-04 NOTE — Progress Notes (Signed)
[facility]  [address] [place], [place] [address] Phone: [phone]  Fax: [phone]   Clinic Day:  01/05/2020  Referring physician: Jearld Fenton, NP  Chief Complaint: Becky Farrell is a 86 y.o. female with stage I right breast cancer who is seen for a 6 month assessment.   HPI: The patient was last seen in the medical oncology clinic on 07/07/2019. At that time, she felt "fine".  Dental work was complete.  She noted some balance issues and slight dizziness which preceded initiation of Femara. CBC and CMP were normal. CA 27.29 was 18.1.  She received Prolia. She continued calcium and vitamin D.   Bilateral diagnostic mammogram on 08/04/2019 revealed no evidence of malignancy in either breast. There were lumpectomy changes on the right.   She saw Dr. Baruch Farrell on 08/20/2019. She was doing well. She denied breast tenderness, cough or bone pain. She was tolerating letrozole. She will follow up in 03/03/2020.  She was seen in the medical oncology clinic on 10/06/2019 by Becky Rutter, NP. At that time, she was doing well.  She had no evidence of recurrence.   Chronic gait and balance issues were no worse.  She was tolerating Femara well without significant side effects. CBC and CMP were normal. CA 27.29 was 19.3 (normal).  Becky Resides, NP provided education regarding self breast exams.  She continued Femara.   INR has been followed by Dr. Garnette Farrell: 2.6 on 07/09/2019, 3.8 on 08/11/2019, 1.7 on 08/25/2019, 1.6 on 09/08/2019, 2.3 on 09/21/2019, 2.3 on 10/08/2019, 2.2 on 11/06/2019, 2.3 on 12/07/2019, 3.5 on 01/04/2020.   During the interim, she has felt "fine". She notes feeling dizzy associated with being off balance. She takes power naps daily. She remains on letrozole. She denies any issues with the last Prolia shot. She is taking her calcium and vitamin D. She notes a change in her bowels; last INR was 3.5 on 01/04/2020.   She continues to examine her breast  monthly. She denies any dental issues. She reports no falls. She got her 1st COVID-19 vaccine in 12/2019 and she will get her 2nd dose on 01/14/2020.  Patient in concerned about the change in her voice which she has noticed during choir practice.    Past Medical History:  Diagnosis Date  . Colon polyps   . Family history of breast cancer   . History of pulmonary embolism 2011  . Hx of melanoma of skin 2003   Right leg  . Hypertension   . Melanoma ([place]) 2003  . Personal history of radiation therapy   . Seizures ([place]) 1993   no meds since (approx) 2014  . Vertigo    x1. R/T perforated ear drum prior to 2013    Past Surgical History:  Procedure Laterality Date  . ABDOMINAL HYSTERECTOMY  05/2014  . BREAST BIOPSY Right 08/20/2018   invasive mammary carcinoma/DCIS   . BREAST LUMPECTOMY Right 10/10/2018  . BREAST LUMPECTOMY WITH RADIOACTIVE SEED LOCALIZATION Right 10/10/2018   Procedure: BREAST LUMPECTOMY WITH RADIOACTIVE SEED LOCALIZATION;  Surgeon: Jovita Kussmaul, MD;  Location: [facility];  Service: General;  Laterality: Right;  . CATARACT EXTRACTION W/PHACO Right 01/06/2018   Procedure: CATARACT EXTRACTION PHACO AND INTRAOCULAR LENS PLACEMENT ([place]) RIGHT;  Surgeon: Leandrew Koyanagi, MD;  Location: [facility];  Service: Ophthalmology;  Laterality: Right;  . CATARACT EXTRACTION W/PHACO Left 01/29/2018   Procedure: CATARACT EXTRACTION PHACO AND INTRAOCULAR LENS PLACEMENT ([place]) LEFT;  Surgeon: Leandrew Koyanagi, MD;  Location:  [facility];  Service: Ophthalmology;  Laterality: Left;  . left hand surgery  05/2011   s/p fall  . OTHER SURGICAL HISTORY  2003   Removal of lymphoma from melonoma 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     Family History  Problem Relation Age of Onset  . Heart disease Mother   . Aortic aneurysm Father   . Breast cancer Daughter 22  . Breast cancer Niece 42    Social History:  reports that she has never smoked. She has never used smokeless tobacco. She reports  that she does not drink alcohol or use drugs. The patient recently moved from [place] to [place]. Her daughter and grandchildren live in [place]. She has other children in [place]. Her husband has been recovering from prostate cancer. She is a retired Pharmacist, hospital. 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  Allergies:  Allergies  Allergen Reactions  . Clobetasol Other (See Comments)    vertigo  . Clobetasol Propionate Other (See Comments)    This is ointment form UNSPECIFIED REACTION   . Phenergan [Promethazine Hcl] Other (See Comments)    Rapid heart rate  . Dextromethorphan Palpitations and Other (See Comments)    promethazine with DM, rapid hearbeat  . Zithromax [Azithromycin] Palpitations and Other (See Comments)    Per OSH report, pt unable to recall Rapid heart rate    Current Medications: Current Outpatient Medications  Medication Sig Dispense Refill  . aspirin 81 MG tablet Take 81 mg by mouth daily.    Marland Kitchen atenolol (TENORMIN) 25 MG tablet TAKE 1 TABLET BY MOUTH EVERY DAY 90 tablet 0  . Cholecalciferol (VITAMIN D3) 2000 UNITS TABS Take 2,000 Units by mouth daily.     . hydrochlorothiazide (MICROZIDE) 12.5 MG capsule TAKE 1 CAPSULE BY MOUTH EVERY DAY 90 capsule 1  . Iodoquinol-HC-Aloe Polysacch (ALCORTIN A) 1-2-1 % GEL Apply topically as needed (for rash).    Marland Kitchen letrozole (FEMARA) 2.5 MG tablet Take 1 tablet (2.5 mg total) by mouth daily. 90 tablet 3  . lisinopril (ZESTRIL) 10 MG tablet TAKE 1 TABLET BY MOUTH EVERY DAY 90 tablet 0  . polyethylene glycol (MIRALAX / GLYCOLAX) packet Take 17 g by mouth every other day.    . Wheat Dextrin (BENEFIBER PO) Take 2 each by mouth See admin instructions. Mix 2 teaspoons and drink 3 times daily    . warfarin (COUMADIN) 5 MG tablet TAKE 1 TAB BY MOUTH DAILY ON FRIDAYS, 1/3 TABS ON MONDAYS, 1.5 TABS DAILY ON ALL OTHER DAYS. (Patient not taking: 7.5 MG: Every Day Except Wednesday. Skips Wednesday.) 90 tablet 0  . warfarin (COUMADIN) 7.5 MG tablet  TAKE 1 TABLET EVERY DAY AND AS DIRECTED 90 tablet 1   No current facility-administered medications for this visit.    Review of Systems  Constitutional: Negative for chills, diaphoresis, fever, malaise/fatigue and weight loss (stable).       Feels "fine".  HENT: Positive for hearing loss. Negative for congestion, ear discharge, ear pain, nosebleeds, sinus pain and sore throat.        No dental issues (cleared for Prolia by dentist).  Change in voice.  Eyes: Negative.  Negative for blurred vision, double vision and photophobia.  Respiratory: Negative.  Negative for cough, hemoptysis, sputum production and shortness of breath.        Recurrent PE x 2.  Cardiovascular: Negative.  Negative for chest pain, palpitations, orthopnea, leg swelling and PND.  Gastrointestinal: Negative.  Negative for abdominal pain, blood in stool, constipation, diarrhea, heartburn,  melena, nausea and vomiting.  Genitourinary: Negative.  Negative for dysuria, frequency, hematuria and urgency.  Musculoskeletal: Negative for back pain, falls, joint pain, myalgias and neck pain.       Osteoporosis.  Skin: Negative.  Negative for itching and rash.  Neurological: Positive for dizziness. Negative for tremors, sensory change, speech change, focal weakness, seizures (history), weakness and headaches.       Chronic balance issues.  Endo/Heme/Allergies: Bruises/bleeds easily (on Coumadin).  Psychiatric/Behavioral: Negative.  Negative for depression and memory loss. The patient is not nervous/anxious and does not have insomnia.   All other systems reviewed and are negative.  Performance status (ECOG): 1  Vitals Blood pressure (!) 152/87, pulse 60, temperature (!) 97 F (36.1 C), temperature source Tympanic, resp. rate 18, weight 142 lb 12 oz (64.8 kg), SpO2 100 %.   Physical Exam  Constitutional: She is oriented to person, place, and time. She appears well-developed and well-nourished. No distress.  HENT:  Head:  Normocephalic and atraumatic.  Mouth/Throat: Oropharynx is clear and moist. No oropharyngeal exudate.  Short gray hair.  Mask.  Eyes: Pupils are equal, round, and reactive to light. Conjunctivae and EOM are normal. No scleral icterus.  Glasses. Blue eyes.  Neck: No JVD present.  Cardiovascular: Normal rate, regular rhythm and normal heart sounds.  No murmur heard. Pulmonary/Chest: Effort normal and breath sounds normal. No respiratory distress. She has no wheezes. She has no rales. She exhibits no tenderness. Right breast exhibits tenderness.  Abdominal: Soft. Bowel sounds are normal. She exhibits no distension and no mass. There is no abdominal tenderness. There is no rebound and no guarding.  Musculoskeletal:        General: No tenderness or edema. Normal range of motion.     Cervical back: Normal range of motion and neck supple.  Lymphadenopathy:       Head (right side): No preauricular, no posterior auricular and no occipital adenopathy present.       Head (left side): No preauricular, no posterior auricular and no occipital adenopathy present.    She has no cervical adenopathy.    She has no axillary adenopathy.       Right: No inguinal and no supraclavicular adenopathy present.       Left: No inguinal and no supraclavicular adenopathy present.  Neurological: She is alert and oriented to person, place, and time.  Skin: Skin is warm and dry. No rash noted. She is not diaphoretic. No erythema. No pallor.  Psychiatric: She has a normal mood and affect. Her behavior is normal. Judgment and thought content normal.  Nursing note and vitals reviewed.   Appointment on 01/05/2020  Component Date Value Ref Range Status  . Sodium 01/05/2020 138  135 - 145 mmol/L Final  . Potassium 01/05/2020 4.0  3.5 - 5.1 mmol/L Final  . Chloride 01/05/2020 102  98 - 111 mmol/L Final  . CO2 01/05/2020 32  22 - 32 mmol/L Final  . Glucose, Bld 01/05/2020 87  70 - 99 mg/dL Final  . BUN 01/05/2020 23  8 - 23  mg/dL Final  . Creatinine, Ser 01/05/2020 0.72  0.44 - 1.00 mg/dL Final  . Calcium 01/05/2020 9.7  8.9 - 10.3 mg/dL Final  . Total Protein 01/05/2020 7.3  6.5 - 8.1 g/dL Final  . Albumin 01/05/2020 3.7  3.5 - 5.0 g/dL Final  . AST 01/05/2020 19  15 - 41 U/L Final  . ALT 01/05/2020 13  0 - 44 U/L Final  . Alkaline  Phosphatase 01/05/2020 61  38 - 126 U/L Final  . Total Bilirubin 01/05/2020 0.3  0.3 - 1.2 mg/dL Final  . GFR calc non Af Amer 01/05/2020 >60  >60 mL/min Final  . GFR calc Af Amer 01/05/2020 >60  >60 mL/min Final  . Anion gap 01/05/2020 4* 5 - 15 Final   Performed at [facility], [address] [place], [place] [address]  . WBC 01/05/2020 4.4  4.0 - 10.5 K/uL Final  . RBC 01/05/2020 4.03  3.87 - 5.11 MIL/uL Final  . Hemoglobin 01/05/2020 12.9  12.0 - 15.0 g/dL Final  . HCT 01/05/2020 38.4  36.0 - 46.0 % Final  . MCV 01/05/2020 95.3  80.0 - 100.0 fL Final  . MCH 01/05/2020 32.0  26.0 - 34.0 pg Final  . MCHC 01/05/2020 33.6  30.0 - 36.0 g/dL Final  . RDW 01/05/2020 12.1  11.5 - 15.5 % Final  . Platelets 01/05/2020 228  150 - 400 K/uL Final  . nRBC 01/05/2020 0.0  0.0 - 0.2 % Final  . Neutrophils Relative % 01/05/2020 64  % Final  . Neutro Abs 01/05/2020 2.8  1.7 - 7.7 K/uL Final  . Lymphocytes Relative 01/05/2020 22  % Final  . Lymphs Abs 01/05/2020 1.0  0.7 - 4.0 K/uL Final  . Monocytes Relative 01/05/2020 10  % Final  . Monocytes Absolute 01/05/2020 0.4  0.1 - 1.0 K/uL Final  . Eosinophils Relative 01/05/2020 3  % Final  . Eosinophils Absolute 01/05/2020 0.1  0.0 - 0.5 K/uL Final  . Basophils Relative 01/05/2020 1  % Final  . Basophils Absolute 01/05/2020 0.0  0.0 - 0.1 K/uL Final  . Immature Granulocytes 01/05/2020 0  % Final  . Abs Immature Granulocytes 01/05/2020 0.01  0.00 - 0.07 K/uL Final   Performed at [facility], [address], [place], [place] [address]  Anti-coag visit on 01/04/2020  Component Date Value Ref Range Status  . INR  01/04/2020 3.5* 2.0 - 3.0 Final    Assessment:  Becky Farrell is a 86 y.o. female with stage T1aNx right breast cancers/p lumpectomy on 10/10/2018. Pathology revealed a 0.4 cm grade I invasive ductal carcinoma with mucinous features. There was intermediate grade DCIS. Margins were uninvolved (1.0 cm posterior margin). There were fibrocystic changes including apocrine metaplasia. Tumor was ER + (100%), PR + (90%), and Her2/neu-. Ki67 was 10%. Pathologic stagewas T1aNx. CA27.29was 14.1 on 09/08/2018.  Bilateral screening mammogramon 07/31/2018 revealed a possible mass in the right breast. Right diagnostic mammogramand targeted ultrasound on 08/13/2018 revealed a 4 x 5 x 8 mm indeterminate mass at the 10 o'clock position 9 cm from the nipple in the right breast.  Bilateral diagnostic mammogram on 08/04/2019 revealed no evidence of malignancy in either breast. There were lumpectomy changes on the right.  Shereceivedradiationfrom 12/16/2018- 02/03/2019.She received5040cGyin 28 fractions with a scar boost of another 1400 cGyusing electron beam.  She began Femara on 06/09/2019.  CA27.29 has been followed: 14.1 on 09/08/2018, 14.4 on 04/09/2019, 18.1 on 07/07/2019, 19.3 on 10/06/2019, and 21.0 on 01/05/2020.  Bone densityon 07/31/2018 revealed osteoporosiswith a T-score of -3.4 in the right forearm radius and -1.9 in the right femoral neck.  She began Prolia on 07/07/2019.  She has a history of pulmonary embolismx 2. Initial event occurred in 2011. She had a recurrent pulmonary embolism after holding Coumadin for 1 month for a planned colonoscopy. She is on life long anticoagulation (Coumadin). She has afamily historyof breast cancer (  daughter, age 74, and niece). [facility] genetic testingwas negative.  Symptomatically, she is doing well.  She notices a change in voice.  Exam is stable.  Plan: 1.   Labs today: CBC with diff, CMP, CA 27.29. 2.Stage T1aNx right  breast cancer She is s/p surgery and radiation. She began Femara on 06/09/2019. Clinically, she is doing well. Bilateral diagnostic mammogram on 08/04/2019 revealed no evidence of malignancy. CA 27.29 is normal.   Continue Femara. 3. Osteoporosis Patient on calcium and vitamin D.   She began Prolia on 07/07/2019.  Prolia today. 4.   RTC in 3 months for MD assessment and labs (CBC with diff, CMP and CA 27.29).  I discussed the assessment and treatment plan with the patient.  The patient was provided an opportunity to ask questions and all were answered.  The patient agreed with the plan and demonstrated an understanding of the instructions.  The patient was advised to call back if the symptoms worsen or if the condition fails to improve as anticipated.  I provided 20 minutes of face-to-face time during this this encounter and > 50% was spent counseling as documented under my assessment and plan. An additional 7 minutes were spent reviewing her chart (Epic and Care Everywhere) including notes, labs, and imaging studies.    Lequita Asal, MD, PhD    01/05/2020, 10:43 AM  I, Selena Batten, am acting as scribe for [employer]. Mike Gip, MD, PhD.  I, Fawna Cranmer C. Mike Gip, MD, have reviewed the above documentation for accuracy and completeness, and I agree with the above.

## 2020-01-04 NOTE — Telephone Encounter (Signed)
Pt compliant with coumadin management. Sent in script for 90 day fills

## 2020-01-04 NOTE — Progress Notes (Signed)
Confirmed Name and DOB. Denies any concerns.  

## 2020-01-04 NOTE — Patient Instructions (Addendum)
Pre visit review using our clinic review tool, if applicable. No additional management support is needed unless otherwise documented below in the visit note.  INR: 3.5 email report received from Waldemar Dickens, [place] and RN Rennis Chris Minor at [facility].  Advised to hold dose today then continue taking 7.5mg  DAILY EXCEPT for 0mg  on Wednesdays.  Recheck in 1 week at [facility].

## 2020-01-05 ENCOUNTER — Inpatient Hospital Stay: Payer: Medicare Other | Attending: Hematology and Oncology

## 2020-01-05 ENCOUNTER — Inpatient Hospital Stay: Payer: Medicare Other

## 2020-01-05 ENCOUNTER — Inpatient Hospital Stay (HOSPITAL_BASED_OUTPATIENT_CLINIC_OR_DEPARTMENT_OTHER): Payer: Medicare Other | Admitting: Hematology and Oncology

## 2020-01-05 ENCOUNTER — Encounter: Payer: Self-pay | Admitting: Hematology and Oncology

## 2020-01-05 VITALS — BP 152/87 | HR 60 | Temp 97.0°F | Resp 18 | Wt 142.7 lb

## 2020-01-05 DIAGNOSIS — C50211 Malignant neoplasm of upper-inner quadrant of right female breast: Secondary | ICD-10-CM | POA: Diagnosis not present

## 2020-01-05 DIAGNOSIS — Z7982 Long term (current) use of aspirin: Secondary | ICD-10-CM | POA: Insufficient documentation

## 2020-01-05 DIAGNOSIS — R499 Unspecified voice and resonance disorder: Secondary | ICD-10-CM | POA: Insufficient documentation

## 2020-01-05 DIAGNOSIS — Z803 Family history of malignant neoplasm of breast: Secondary | ICD-10-CM | POA: Insufficient documentation

## 2020-01-05 DIAGNOSIS — Z79899 Other long term (current) drug therapy: Secondary | ICD-10-CM | POA: Insufficient documentation

## 2020-01-05 DIAGNOSIS — Z170 Estrogen receptor positive status [ER+]: Secondary | ICD-10-CM | POA: Insufficient documentation

## 2020-01-05 DIAGNOSIS — I10 Essential (primary) hypertension: Secondary | ICD-10-CM | POA: Insufficient documentation

## 2020-01-05 DIAGNOSIS — M810 Age-related osteoporosis without current pathological fracture: Secondary | ICD-10-CM

## 2020-01-05 DIAGNOSIS — Z86711 Personal history of pulmonary embolism: Secondary | ICD-10-CM | POA: Insufficient documentation

## 2020-01-05 DIAGNOSIS — R42 Dizziness and giddiness: Secondary | ICD-10-CM | POA: Insufficient documentation

## 2020-01-05 DIAGNOSIS — Z79811 Long term (current) use of aromatase inhibitors: Secondary | ICD-10-CM | POA: Insufficient documentation

## 2020-01-05 DIAGNOSIS — Z8249 Family history of ischemic heart disease and other diseases of the circulatory system: Secondary | ICD-10-CM | POA: Diagnosis not present

## 2020-01-05 DIAGNOSIS — Z923 Personal history of irradiation: Secondary | ICD-10-CM | POA: Diagnosis not present

## 2020-01-05 DIAGNOSIS — Z7901 Long term (current) use of anticoagulants: Secondary | ICD-10-CM | POA: Insufficient documentation

## 2020-01-05 LAB — CBC WITH DIFFERENTIAL/PLATELET
Abs Immature Granulocytes: 0.01 10*3/uL (ref 0.00–0.07)
Basophils Absolute: 0 10*3/uL (ref 0.0–0.1)
Basophils Relative: 1 %
Eosinophils Absolute: 0.1 10*3/uL (ref 0.0–0.5)
Eosinophils Relative: 3 %
HCT: 38.4 % (ref 36.0–46.0)
Hemoglobin: 12.9 g/dL (ref 12.0–15.0)
Immature Granulocytes: 0 %
Lymphocytes Relative: 22 %
Lymphs Abs: 1 10*3/uL (ref 0.7–4.0)
MCH: 32 pg (ref 26.0–34.0)
MCHC: 33.6 g/dL (ref 30.0–36.0)
MCV: 95.3 fL (ref 80.0–100.0)
Monocytes Absolute: 0.4 10*3/uL (ref 0.1–1.0)
Monocytes Relative: 10 %
Neutro Abs: 2.8 10*3/uL (ref 1.7–7.7)
Neutrophils Relative %: 64 %
Platelets: 228 10*3/uL (ref 150–400)
RBC: 4.03 MIL/uL (ref 3.87–5.11)
RDW: 12.1 % (ref 11.5–15.5)
WBC: 4.4 10*3/uL (ref 4.0–10.5)
nRBC: 0 % (ref 0.0–0.2)

## 2020-01-05 LAB — COMPREHENSIVE METABOLIC PANEL
ALT: 13 U/L (ref 0–44)
AST: 19 U/L (ref 15–41)
Albumin: 3.7 g/dL (ref 3.5–5.0)
Alkaline Phosphatase: 61 U/L (ref 38–126)
Anion gap: 4 — ABNORMAL LOW (ref 5–15)
BUN: 23 mg/dL (ref 8–23)
CO2: 32 mmol/L (ref 22–32)
Calcium: 9.7 mg/dL (ref 8.9–10.3)
Chloride: 102 mmol/L (ref 98–111)
Creatinine, Ser: 0.72 mg/dL (ref 0.44–1.00)
GFR calc Af Amer: >60 mL/min (ref >60)
GFR calc non Af Amer: >60 mL/min (ref >60)
Glucose, Bld: 87 mg/dL (ref 70–99)
Potassium: 4 mmol/L (ref 3.5–5.1)
Sodium: 138 mmol/L (ref 135–145)
Total Bilirubin: 0.3 mg/dL (ref 0.3–1.2)
Total Protein: 7.3 g/dL (ref 6.5–8.1)

## 2020-01-05 MED ORDER — DENOSUMAB 60 MG/ML ~~LOC~~ SOSY
60.0000 mg | PREFILLED_SYRINGE | Freq: Once | SUBCUTANEOUS | Status: AC
  Administered 2020-01-05: 11:00:00 60 mg via SUBCUTANEOUS

## 2020-01-05 NOTE — Patient Instructions (Signed)
Denosumab injection What is this medicine? DENOSUMAB (den oh sue mab) slows bone breakdown. Prolia is used to treat osteoporosis in women after menopause and in men, and in people who are taking corticosteroids for 6 months or more. Xgeva is used to treat a high calcium level due to cancer and to prevent bone fractures and other bone problems caused by multiple myeloma or cancer bone metastases. Xgeva is also used to treat giant cell tumor of the bone. This medicine may be used for other purposes; ask your health care provider or pharmacist if you have questions. COMMON BRAND NAME(S): Prolia, XGEVA What should I tell my health care provider before I take this medicine? They need to know if you have any of these conditions:  dental disease  having surgery or tooth extraction  infection  kidney disease  low levels of calcium or Vitamin D in the blood  malnutrition  on hemodialysis  skin conditions or sensitivity  thyroid or parathyroid disease  an unusual reaction to denosumab, other medicines, foods, dyes, or preservatives  pregnant or trying to get pregnant  breast-feeding How should I use this medicine? This medicine is for injection under the skin. It is given by a health care professional in a hospital or clinic setting. A special MedGuide will be given to you before each treatment. Be sure to read this information carefully each time. For Prolia, talk to your pediatrician regarding the use of this medicine in children. Special care may be needed. For Xgeva, talk to your pediatrician regarding the use of this medicine in children. While this drug may be prescribed for children as young as 13 years for selected conditions, precautions do apply. Overdosage: If you think you have taken too much of this medicine contact a poison control center or emergency room at once. NOTE: This medicine is only for you. Do not share this medicine with others. What if I miss a dose? It is  important not to miss your dose. Call your doctor or health care professional if you are unable to keep an appointment. What may interact with this medicine? Do not take this medicine with any of the following medications:  other medicines containing denosumab This medicine may also interact with the following medications:  medicines that lower your chance of fighting infection  steroid medicines like prednisone or cortisone This list may not describe all possible interactions. Give your health care provider a list of all the medicines, herbs, non-prescription drugs, or dietary supplements you use. Also tell them if you smoke, drink alcohol, or use illegal drugs. Some items may interact with your medicine. What should I watch for while using this medicine? Visit your doctor or health care professional for regular checks on your progress. Your doctor or health care professional may order blood tests and other tests to see how you are doing. Call your doctor or health care professional for advice if you get a fever, chills or sore throat, or other symptoms of a cold or flu. Do not treat yourself. This drug may decrease your body's ability to fight infection. Try to avoid being around people who are sick. You should make sure you get enough calcium and vitamin D while you are taking this medicine, unless your doctor tells you not to. Discuss the foods you eat and the vitamins you take with your health care professional. See your dentist regularly. Brush and floss your teeth as directed. Before you have any dental work done, tell your dentist you are   receiving this medicine. Do not become pregnant while taking this medicine or for 5 months after stopping it. Talk with your doctor or health care professional about your birth control options while taking this medicine. Women should inform their doctor if they wish to become pregnant or think they might be pregnant. There is a potential for serious side  effects to an unborn child. Talk to your health care professional or pharmacist for more information. What side effects may I notice from receiving this medicine? Side effects that you should report to your doctor or health care professional as soon as possible:  allergic reactions like skin rash, itching or hives, swelling of the face, lips, or tongue  bone pain  breathing problems  dizziness  jaw pain, especially after dental work  redness, blistering, peeling of the skin  signs and symptoms of infection like fever or chills; cough; sore throat; pain or trouble passing urine  signs of low calcium like fast heartbeat, muscle cramps or muscle pain; pain, tingling, numbness in the hands or feet; seizures  unusual bleeding or bruising  unusually weak or tired Side effects that usually do not require medical attention (report to your doctor or health care professional if they continue or are bothersome):  constipation  diarrhea  headache  joint pain  loss of appetite  muscle pain  runny nose  tiredness  upset stomach This list may not describe all possible side effects. Call your doctor for medical advice about side effects. You may report side effects to FDA at 1-800-FDA-1088. Where should I keep my medicine? This medicine is only given in a clinic, doctor's office, or other health care setting and will not be stored at home. NOTE: This sheet is a summary. It may not cover all possible information. If you have questions about this medicine, talk to your doctor, pharmacist, or health care provider.  2020 Elsevier/Gold Standard (2018-03-28 16:10:44)

## 2020-01-06 LAB — CANCER ANTIGEN 27.29: CA 27.29: 21 U/mL (ref 0.0–38.6)

## 2020-01-08 DIAGNOSIS — H0014 Chalazion left upper eyelid: Secondary | ICD-10-CM | POA: Diagnosis not present

## 2020-01-11 ENCOUNTER — Ambulatory Visit (INDEPENDENT_AMBULATORY_CARE_PROVIDER_SITE_OTHER): Payer: Medicare Other

## 2020-01-11 DIAGNOSIS — Z7901 Long term (current) use of anticoagulants: Secondary | ICD-10-CM | POA: Diagnosis not present

## 2020-01-11 DIAGNOSIS — Z86711 Personal history of pulmonary embolism: Secondary | ICD-10-CM | POA: Diagnosis not present

## 2020-01-11 DIAGNOSIS — Z86718 Personal history of other venous thrombosis and embolism: Secondary | ICD-10-CM | POA: Diagnosis not present

## 2020-01-11 LAB — POCT INR: INR: 2.5 (ref 2.0–3.0)

## 2020-01-11 NOTE — Patient Instructions (Addendum)
Pre visit review using our clinic review tool, if applicable. No additional management support is needed unless otherwise documented below in the visit note.  INR: 2.5 today. No changes in dosing, continue 7.5mg  daily except 0mg  on Wed. Recheck in 4 wks. Contacted pt and advised. Pt verbalized understanding.

## 2020-01-18 ENCOUNTER — Encounter: Payer: Self-pay | Admitting: Podiatry

## 2020-01-18 ENCOUNTER — Ambulatory Visit: Payer: Medicare Other | Admitting: Podiatry

## 2020-01-18 ENCOUNTER — Ambulatory Visit (INDEPENDENT_AMBULATORY_CARE_PROVIDER_SITE_OTHER): Payer: Medicare Other | Admitting: Podiatry

## 2020-01-18 ENCOUNTER — Other Ambulatory Visit: Payer: Self-pay

## 2020-01-18 DIAGNOSIS — B351 Tinea unguium: Secondary | ICD-10-CM

## 2020-01-18 DIAGNOSIS — M79609 Pain in unspecified limb: Secondary | ICD-10-CM | POA: Diagnosis not present

## 2020-01-18 DIAGNOSIS — D689 Coagulation defect, unspecified: Secondary | ICD-10-CM

## 2020-01-18 NOTE — Progress Notes (Signed)
Complaint:  Visit Type: Patient returns to my office for continued preventative foot care services. Complaint: Patient states" my nails have grown long and thick and become painful to walk and wear shoes"  The patient presents for preventative foot care services. No changes to ROS.  Patient is taking coumadin.  Podiatric Exam: Vascular: dorsalis pedis and posterior tibial pulses are palpable bilateral. Capillary return is immediate. Temperature gradient is WNL. Skin turgor WNL  Sensorium: Normal Semmes Weinstein monofilament test. Normal tactile sensation bilaterally. Nail Exam: Pt has thick disfigured discolored nails with subungual debris noted bilateral entire nail hallux through fifth toenails Ulcer Exam: There is no evidence of ulcer or pre-ulcerative changes or infection. Orthopedic Exam: Muscle tone and strength are WNL. No limitations in general ROM. No crepitus or effusions noted. Foot type and digits show no abnormalities. Bony prominences are unremarkable. Skin: No Porokeratosis. No infection or ulcers  Diagnosis:  Onychomycosis, , Pain in right toe, pain in left toes  Treatment & Plan Procedures and Treatment: Consent by patient was obtained for treatment procedures. The patient understood the discussion of treatment and procedures well. All questions were answered thoroughly reviewed. Debridement of mycotic and hypertrophic toenails, 1 through 5 bilateral and clearing of subungual debris. No ulceration, no infection noted.   Return Visit-Office Procedure: Patient instructed to return to the office for a follow up visit 3 months for continued evaluation and treatment.    Gardiner Barefoot DPM

## 2020-02-08 DIAGNOSIS — Z86711 Personal history of pulmonary embolism: Secondary | ICD-10-CM | POA: Diagnosis not present

## 2020-02-08 DIAGNOSIS — Z86718 Personal history of other venous thrombosis and embolism: Secondary | ICD-10-CM | POA: Diagnosis not present

## 2020-02-08 DIAGNOSIS — Z7901 Long term (current) use of anticoagulants: Secondary | ICD-10-CM | POA: Diagnosis not present

## 2020-02-08 LAB — POCT INR: INR: 2.7 (ref 2.0–3.0)

## 2020-02-09 ENCOUNTER — Ambulatory Visit (INDEPENDENT_AMBULATORY_CARE_PROVIDER_SITE_OTHER): Payer: Medicare Other

## 2020-02-09 DIAGNOSIS — Z7901 Long term (current) use of anticoagulants: Secondary | ICD-10-CM

## 2020-02-09 NOTE — Patient Instructions (Addendum)
Pre visit review using our clinic review tool, if applicable. No additional management support is needed unless otherwise documented below in the visit note.  INR: 2.7. Today received by fax from [facility]. No changes in dosing, continue 7.5mg  daily except 0mg  on Wed. Recheck in 4 wks. Contacted pt and advised. Pt verbalized understanding.     Patient is doing well without any changes to diet, health or medications.  [facility] to email next lab result in 4 weeks to coumadin clinic for management. Emailed Janci Minor, RN with [facility], with directions and next date for INR.

## 2020-02-25 ENCOUNTER — Ambulatory Visit: Payer: Medicare Other | Admitting: Radiation Oncology

## 2020-03-03 ENCOUNTER — Encounter: Payer: Self-pay | Admitting: Radiation Oncology

## 2020-03-03 ENCOUNTER — Ambulatory Visit
Admission: RE | Admit: 2020-03-03 | Discharge: 2020-03-03 | Disposition: A | Discharge status: Home / Self Care | Payer: Medicare Other | Source: Ambulatory Visit | Attending: Radiation Oncology | Admitting: Radiation Oncology

## 2020-03-03 ENCOUNTER — Other Ambulatory Visit: Payer: Self-pay

## 2020-03-03 VITALS — BP 137/85 | HR 63 | Temp 96.2°F | Resp 16 | Wt 142.0 lb

## 2020-03-03 DIAGNOSIS — Z170 Estrogen receptor positive status [ER+]: Secondary | ICD-10-CM | POA: Diagnosis not present

## 2020-03-03 DIAGNOSIS — Z923 Personal history of irradiation: Secondary | ICD-10-CM | POA: Diagnosis not present

## 2020-03-03 DIAGNOSIS — C50211 Malignant neoplasm of upper-inner quadrant of right female breast: Secondary | ICD-10-CM | POA: Insufficient documentation

## 2020-03-03 DIAGNOSIS — Z79811 Long term (current) use of aromatase inhibitors: Secondary | ICD-10-CM | POA: Insufficient documentation

## 2020-03-03 DIAGNOSIS — R42 Dizziness and giddiness: Secondary | ICD-10-CM | POA: Insufficient documentation

## 2020-03-03 NOTE — Progress Notes (Signed)
Radiation Oncology Follow up Note  Name: Becky Farrell   Date:   03/03/2020 MRN:  [id number] DOB: 1933-12-11    This 86 y.o. female presents to the clinic today for 1 year follow-up status post whole breast radiation to her right breast for stage I ER/PR positive invasive mammary carcinoma.  REFERRING PROVIDER: Jearld Fenton, NP  HPI: Patient is an 86 year old female now at 1 year having completed whole breast radiation to her right breast for stage I ER/PR positive invasive mammary carcinoma seen today in routine follow-up for breast standpoint she is doing well.  She specifically denies breast tenderness cough or bone pain.  She has been having trouble with vertigo is being followed by ENT for that.  She is a longstanding history of problems with her balance.  She is currently on letrozole tolerating that well without side effect..  She had mammograms last September which I have reviewed were BI-RADS 2 benign  COMPLICATIONS OF TREATMENT: none  FOLLOW UP COMPLIANCE: keeps appointments   PHYSICAL EXAM:  BP 137/85   Pulse 63   Temp (!) 96.2 F (35.7 C)   Resp 16   Wt 142 lb (64.4 kg)   BMI 23.63 kg/m  Lungs are clear to A&P cardiac examination essentially unremarkable with regular rate and rhythm. No dominant mass or nodularity is noted in either breast in 2 positions examined. Incision is well-healed. No axillary or supraclavicular adenopathy is appreciated. Cosmetic result is excellent.  Well-developed well-nourished patient in NAD. HEENT reveals PERLA, EOMI, discs not visualized.  Oral cavity is clear. No oral mucosal lesions are identified. Neck is clear without evidence of cervical or supraclavicular adenopathy. Lungs are clear to A&P. Cardiac examination is essentially unremarkable with regular rate and rhythm without murmur rub or thrill. Abdomen is benign with no organomegaly or masses noted. Motor sensory and DTR levels are equal and symmetric in the upper and lower extremities.  Cranial nerves II through XII are grossly intact. Proprioception is intact. No peripheral adenopathy or edema is identified. No motor or sensory levels are noted. Crude visual fields are within normal range.  RADIOLOGY RESULTS: Mammograms reviewed compatible with above-stated findings  PLAN: Present time patient is doing well from a breast standpoint with no evidence of disease 1 year out from whole breast radiation and pleased with her overall progress.  She continues on letrozole which can cause dizziness as a side effect.  I have asked her to stop letrozole for several weeks, to see if that improves her dizziness..  She continues follow-up with Dr. Richardson Landry for her vertigo problems.  I have asked to see her back in 1 year for follow-up.  Patient knows to call sooner with any concerns.  I would like to take this opportunity to thank you for allowing me to participate in the care of your patient.Noreene Filbert, MD

## 2020-03-07 ENCOUNTER — Other Ambulatory Visit: Payer: Self-pay | Admitting: Internal Medicine

## 2020-03-07 DIAGNOSIS — Z86718 Personal history of other venous thrombosis and embolism: Secondary | ICD-10-CM | POA: Diagnosis not present

## 2020-03-07 DIAGNOSIS — Z7901 Long term (current) use of anticoagulants: Secondary | ICD-10-CM | POA: Diagnosis not present

## 2020-03-07 DIAGNOSIS — Z86711 Personal history of pulmonary embolism: Secondary | ICD-10-CM | POA: Diagnosis not present

## 2020-03-10 ENCOUNTER — Ambulatory Visit (INDEPENDENT_AMBULATORY_CARE_PROVIDER_SITE_OTHER): Payer: Medicare Other

## 2020-03-10 DIAGNOSIS — Z7901 Long term (current) use of anticoagulants: Secondary | ICD-10-CM | POA: Diagnosis not present

## 2020-03-10 LAB — POCT INR: INR: 3.5 — AB (ref 2.0–3.0)

## 2020-03-10 NOTE — Patient Instructions (Addendum)
Pre visit review using our clinic review tool, if applicable. No additional management support is needed unless otherwise documented below in the visit note.  INR: 3.5. Today received email from [facility] with INR result. Contacted pt and advised to hold dose today and then continue 7.5mg  daily except 0mg  on Wed. Recheck in 2 wks. Pt verbalized understanding.

## 2020-03-21 DIAGNOSIS — Z86718 Personal history of other venous thrombosis and embolism: Secondary | ICD-10-CM | POA: Diagnosis not present

## 2020-03-21 DIAGNOSIS — Z7901 Long term (current) use of anticoagulants: Secondary | ICD-10-CM | POA: Diagnosis not present

## 2020-03-21 DIAGNOSIS — Z86711 Personal history of pulmonary embolism: Secondary | ICD-10-CM | POA: Diagnosis not present

## 2020-03-21 LAB — POCT INR: INR: 2.3 (ref 2.0–3.0)

## 2020-03-21 LAB — PROTIME-INR: INR: 2.3 — AB (ref 0.9–1.1)

## 2020-03-22 ENCOUNTER — Ambulatory Visit (INDEPENDENT_AMBULATORY_CARE_PROVIDER_SITE_OTHER): Payer: Medicare Other

## 2020-03-22 DIAGNOSIS — Z7901 Long term (current) use of anticoagulants: Secondary | ICD-10-CM

## 2020-03-22 NOTE — Patient Instructions (Addendum)
Pre visit review using our clinic review tool, if applicable. No additional management support is needed unless otherwise documented below in the visit note.  INR: 2.3. Today received fax with INR result from [facility].  Contacted pt and advised to continue 7.5mg  daily except 0mg  on Wed. Recheck in 4 wks. Pt verbalized understanding.

## 2020-03-29 DIAGNOSIS — Z170 Estrogen receptor positive status [ER+]: Secondary | ICD-10-CM | POA: Diagnosis not present

## 2020-03-29 DIAGNOSIS — C50411 Malignant neoplasm of upper-outer quadrant of right female breast: Secondary | ICD-10-CM | POA: Diagnosis not present

## 2020-03-30 ENCOUNTER — Other Ambulatory Visit: Payer: Self-pay | Admitting: Internal Medicine

## 2020-04-04 ENCOUNTER — Other Ambulatory Visit: Payer: Self-pay

## 2020-04-04 NOTE — Progress Notes (Signed)
No new changes noted. The patient Name and DOB has been verified by phone today.

## 2020-04-04 NOTE — Progress Notes (Signed)
[facility]  [address], [address] [place], [place] [address] Phone: [phone]  Fax: [phone]   Clinic Day:  04/05/2020  Referring physician: Jearld Fenton, NP  Chief Complaint: Becky Farrell is a 86 y.o. female with stage I right breast cancer who is seen for a 3 month assessment.   HPI: The patient was last seen in the medical oncology clinic on 01/05/2020. At that time, she was doing well.  She noticed a change in her voice.  Exam was stable. Hematocrit was 38.4, hemoglobin 12.9, MCV 95.3, platelets 228,000, WBC 4,400, ANC 2,800. CA 27.29 was 21.0.   She followed up with Dr. Baruch Gouty on 03/03/2020. She was instructed to hold letrozole for several weeks as she has been experiencing severe vertigo and dizziness which can be a side effect of letrozole.  She sees Dr Richardson Landry in ENT for vertigo.  She stopped her letrozole from 03/03/2020 to 03/23/2020. She did not notice any difference in her vertigo. She denies any side effects from letrozole, but she does note some mild leg cramping at night.   She does have a perforated eardrum on the left side. She sees an ENT specialist, Dr. Nyoka Cowden; he cleans her ears manually. She notes that she has a bone deformity in her left ear. She notes that she has made some adjustments in her lift to take things more slow in order to maintain her balance.    Past Medical History:  Diagnosis Date  . Colon polyps   . Family history of breast cancer   . History of pulmonary embolism 2011  . Hx of melanoma of skin 2003   Right leg  . Hypertension   . Melanoma ([place]) 2003  . Personal history of radiation therapy   . Seizures ([place]) 1993   no meds since (approx) 2014  . Vertigo    x1. R/T perforated ear drum prior to 2013    Past Surgical History:  Procedure Laterality Date  . ABDOMINAL HYSTERECTOMY  05/2014  . BREAST BIOPSY Right 08/20/2018   invasive mammary carcinoma/DCIS   . BREAST LUMPECTOMY Right 10/10/2018  .  BREAST LUMPECTOMY WITH RADIOACTIVE SEED LOCALIZATION Right 10/10/2018   Procedure: BREAST LUMPECTOMY WITH RADIOACTIVE SEED LOCALIZATION;  Surgeon: Jovita Kussmaul, MD;  Location: [facility];  Service: General;  Laterality: Right;  . CATARACT EXTRACTION W/PHACO Right 01/06/2018   Procedure: CATARACT EXTRACTION PHACO AND INTRAOCULAR LENS PLACEMENT ([place]) RIGHT;  Surgeon: Leandrew Koyanagi, MD;  Location: [facility];  Service: Ophthalmology;  Laterality: Right;  . CATARACT EXTRACTION W/PHACO Left 01/29/2018   Procedure: CATARACT EXTRACTION PHACO AND INTRAOCULAR LENS PLACEMENT ([place]) LEFT;  Surgeon: Leandrew Koyanagi, MD;  Location: [facility];  Service: Ophthalmology;  Laterality: Left;  . left hand surgery  05/2011   s/p fall  . OTHER SURGICAL HISTORY  2003   Removal of lymphoma from melonoma 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     Family History  Problem Relation Age of Onset  . Heart disease Mother   . Aortic aneurysm Father   . Breast cancer Daughter 41  . Breast cancer Niece 67    Social History:  reports that she has never smoked. She has never used smokeless tobacco. She reports that she does not drink alcohol or use drugs. The patient recently moved from [place] to [place]. Her daughter and grandchildren live in [place]. She has other children in [place]. Her husband has been recovering from prostate cancer. She is a retired Pharmacist, hospital. The  patient is alone today.   Allergies:  Allergies  Allergen Reactions  . Clobetasol Other (See Comments)    vertigo  . Clobetasol Propionate Other (See Comments)    This is ointment form UNSPECIFIED REACTION   . Phenergan [Promethazine Hcl] Other (See Comments)    Rapid heart rate  . Dextromethorphan Palpitations and Other (See Comments)    promethazine with DM, rapid hearbeat  . Zithromax [Azithromycin] Palpitations and Other (See Comments)    Per OSH report, pt unable to recall Rapid heart rate    Current Medications: Current  Outpatient Medications  Medication Sig Dispense Refill  . aspirin 81 MG tablet Take 81 mg by mouth daily.    Marland Kitchen atenolol (TENORMIN) 25 MG tablet TAKE 1 TABLET BY MOUTH EVERY DAY 90 tablet 0  . Cholecalciferol (VITAMIN D3) 2000 UNITS TABS Take 2,000 Units by mouth daily.     . hydrochlorothiazide (MICROZIDE) 12.5 MG capsule TAKE 1 CAPSULE BY MOUTH EVERY DAY 90 capsule 0  . Iodoquinol-HC-Aloe Polysacch (ALCORTIN A) 1-2-1 % GEL Apply topically as needed (for rash).    Marland Kitchen letrozole (FEMARA) 2.5 MG tablet Take 1 tablet (2.5 mg total) by mouth daily. 90 tablet 3  . lisinopril (ZESTRIL) 10 MG tablet Take 1 tablet (10 mg total) by mouth daily. SCHEDULE PHYSICAL 90 tablet 0  . polyethylene glycol (MIRALAX / GLYCOLAX) packet Take 17 g by mouth every other day.    . warfarin (COUMADIN) 5 MG tablet TAKE 1 TAB BY MOUTH DAILY ON FRIDAYS, 1/3 TABS ON MONDAYS, 1.5 TABS DAILY ON ALL OTHER DAYS. 90 tablet 0  . warfarin (COUMADIN) 7.5 MG tablet TAKE 1 TABLET EVERY DAY AND AS DIRECTED 90 tablet 1  . Wheat Dextrin (BENEFIBER PO) Take 2 each by mouth See admin instructions. Mix 2 teaspoons and drink 3 times daily     No current facility-administered medications for this visit.    Review of Systems  Constitutional: Negative for chills, diaphoresis, fever, malaise/fatigue and weight loss (stable).       Feels "fine".  HENT: Positive for hearing loss. Negative for congestion, ear discharge, ear pain, nosebleeds, sinus pain and sore throat.        No dental issues (cleared for Prolia by dentist).  Change in voice.  Eyes: Negative.  Negative for blurred vision, double vision and photophobia.  Respiratory: Negative.  Negative for cough, hemoptysis, sputum production and shortness of breath.        Recurrent PE x 2.  Cardiovascular: Negative.  Negative for chest pain, palpitations, orthopnea, leg swelling and PND.  Gastrointestinal: Negative.  Negative for abdominal pain, blood in stool, constipation, diarrhea,  heartburn, melena, nausea and vomiting.  Genitourinary: Negative.  Negative for dysuria, frequency, hematuria and urgency.  Musculoskeletal: Positive for myalgias (mild leg cramping at night). Negative for back pain, falls, joint pain and neck pain.       Osteoporosis.  Skin: Negative.  Negative for itching and rash.  Neurological: Positive for dizziness. Negative for tremors, sensory change, speech change, focal weakness, seizures (history), weakness and headaches.       Chronic balance issues.  Endo/Heme/Allergies: Bruises/bleeds easily (on Coumadin).  Psychiatric/Behavioral: Negative.  Negative for depression and memory loss. The patient is not nervous/anxious and does not have insomnia.   All other systems reviewed and are negative.  Performance status (ECOG): 1 - Symptomatic but completely ambulatory   Vitals Blood pressure 140/90, pulse 74, temperature (!) 97.1 F (36.2 C), temperature source Tympanic, resp. rate 18, weight  141 lb 1.5 oz (64 kg), SpO2 99 %.   Physical Exam  Constitutional: She is oriented to person, place, and time. She appears well-developed and well-nourished. No distress.  HENT:  Head: Normocephalic and atraumatic.  Mouth/Throat: Oropharynx is clear and moist. No oropharyngeal exudate.  Short gray hair.  Mask.  Eyes: Pupils are equal, round, and reactive to light. Conjunctivae and EOM are normal. No scleral icterus.  Glasses. Blue eyes.  Neck: No JVD present.  Cardiovascular: Normal rate, regular rhythm and normal heart sounds.  No murmur heard. Pulmonary/Chest: Effort normal and breath sounds normal. No respiratory distress. She has no wheezes. She has no rales. She exhibits no tenderness. Right breast exhibits tenderness.  Abdominal: Soft. Bowel sounds are normal. She exhibits no distension and no mass. There is no abdominal tenderness. There is no rebound and no guarding.  Musculoskeletal:        General: No tenderness or edema. Normal range of motion.      Cervical back: Normal range of motion and neck supple.  Lymphadenopathy:       Head (right side): No preauricular, no posterior auricular and no occipital adenopathy present.       Head (left side): No preauricular, no posterior auricular and no occipital adenopathy present.    She has no cervical adenopathy.    She has no axillary adenopathy.       Right: No supraclavicular adenopathy present.       Left: No supraclavicular adenopathy present.  Neurological: She is alert and oriented to person, place, and time.  Skin: Skin is warm and dry. Rash (under Bilateral breast) noted. She is not diaphoretic. No erythema. No pallor.  Psychiatric: She has a normal mood and affect. Her behavior is normal. Judgment and thought content normal.  Nursing note and vitals reviewed.   Appointment on 04/05/2020  Component Date Value Ref Range Status  . Sodium 04/05/2020 136  135 - 145 mmol/L Final  . Potassium 04/05/2020 3.8  3.5 - 5.1 mmol/L Final  . Chloride 04/05/2020 101  98 - 111 mmol/L Final  . CO2 04/05/2020 27  22 - 32 mmol/L Final  . Glucose, Bld 04/05/2020 92  70 - 99 mg/dL Final   Glucose reference range applies only to samples taken after fasting for at least 8 hours.  . BUN 04/05/2020 21  8 - 23 mg/dL Final  . Creatinine, Ser 04/05/2020 0.71  0.44 - 1.00 mg/dL Final  . Calcium 04/05/2020 9.5  8.9 - 10.3 mg/dL Final  . Total Protein 04/05/2020 7.2  6.5 - 8.1 g/dL Final  . Albumin 04/05/2020 4.1  3.5 - 5.0 g/dL Final  . AST 04/05/2020 19  15 - 41 U/L Final  . ALT 04/05/2020 13  0 - 44 U/L Final  . Alkaline Phosphatase 04/05/2020 53  38 - 126 U/L Final  . Total Bilirubin 04/05/2020 0.6  0.3 - 1.2 mg/dL Final  . GFR calc non Af Amer 04/05/2020 >60  >60 mL/min Final  . GFR calc Af Amer 04/05/2020 >60  >60 mL/min Final  . Anion gap 04/05/2020 8  5 - 15 Final   Performed at [facility], [address] [place], [place] [address]  . WBC 04/05/2020 4.0  4.0 - 10.5 K/uL Final  . RBC  04/05/2020 4.20  3.87 - 5.11 MIL/uL Final  . Hemoglobin 04/05/2020 13.4  12.0 - 15.0 g/dL Final  . HCT 04/05/2020 40.0  36.0 - 46.0 % Final  . MCV 04/05/2020  95.2  80.0 - 100.0 fL Final  . MCH 04/05/2020 31.9  26.0 - 34.0 pg Final  . MCHC 04/05/2020 33.5  30.0 - 36.0 g/dL Final  . RDW 04/05/2020 13.1  11.5 - 15.5 % Final  . Platelets 04/05/2020 185  150 - 400 K/uL Final  . nRBC 04/05/2020 0.0  0.0 - 0.2 % Final  . Neutrophils Relative % 04/05/2020 55  % Final  . Neutro Abs 04/05/2020 2.2  1.7 - 7.7 K/uL Final  . Lymphocytes Relative 04/05/2020 31  % Final  . Lymphs Abs 04/05/2020 1.2  0.7 - 4.0 K/uL Final  . Monocytes Relative 04/05/2020 10  % Final  . Monocytes Absolute 04/05/2020 0.4  0.1 - 1.0 K/uL Final  . Eosinophils Relative 04/05/2020 3  % Final  . Eosinophils Absolute 04/05/2020 0.1  0.0 - 0.5 K/uL Final  . Basophils Relative 04/05/2020 1  % Final  . Basophils Absolute 04/05/2020 0.0  0.0 - 0.1 K/uL Final  . Immature Granulocytes 04/05/2020 0  % Final  . Abs Immature Granulocytes 04/05/2020 0.01  0.00 - 0.07 K/uL Final   Performed at [facility], [address], [place], [place] [address]    Assessment:  Becky Farrell is a 86 y.o. female with stage T1aNx right breast cancers/p lumpectomy on 10/10/2018. Pathology revealed a 0.4 cm grade I invasive ductal carcinoma with mucinous features. There was intermediate grade DCIS. Margins were uninvolved (1.0 cm posterior margin). There were fibrocystic changes including apocrine metaplasia. Tumor was ER + (100%), PR + (90%), and Her2/neu-. Ki67 was 10%. Pathologic stagewas T1aNx. CA27.29was 14.1 on 09/08/2018.  Bilateral screening mammogramon 07/31/2018 revealed a possible mass in the right breast. Right diagnostic mammogramand targeted ultrasound on 08/13/2018 revealed a 4 x 5 x 8 mm indeterminate mass at the 10 o'clock position 9 cm from the nipple in the right breast.  Bilateral diagnostic mammogram on  08/04/2019 revealed no evidence of malignancy in either breast. There were lumpectomy changes on the right.  Shereceivedradiationfrom 12/16/2018- 02/03/2019.She received5040cGyin 28 fractions with a scar boost of another 1400 cGyusing electron beam.  She began Femara on 06/09/2019.  CA27.29 has been followed: 14.1 on 09/08/2018, 14.4 on 04/09/2019, 18.1 on 07/07/2019, 19.3 on 10/06/2019, 21.0 on 01/05/2020, and 22.4 in 04/05/2020.  Bone densityon 07/31/2018 revealed osteoporosiswith a T-score of -3.4 in the right forearm radius and -1.9 in the right femoral neck.  She began Prolia on 07/07/2019 (last 01/05/2020).  She has a history of pulmonary embolismx 2. Initial event occurred in 2011. She had a recurrent pulmonary embolism after holding Coumadin for 1 month for a planned colonoscopy. She is on life long anticoagulation (Coumadin). She has afamily historyof breast cancer (daughter, age 51, and niece). [facility] genetic testingwas negative.  Symptomatically, she has had some vertigo and dizziness which did not improve after holding letrozole.  Exam is stable.  Plan: 1.   Labs today: CBC with diff, CMP, CA-27.29 2.Stage T1aNx right breast cancer She is s/p surgery and radiation. She began Femara on 06/09/2019. Clinically, she is doing well Bilateral diagnostic mammogram on 08/04/2019 revealed no evidence of malignancy. CA 27.29 is 22.4 (normal).  Continue letrozole. 3. Osteoporosis Patient began Prolia on 07/07/2019 (last 01/05/2020).   She is on calcium and vitamin D.  Bone density due on 08/01/2020.  Continue Prolia every 6 months. 4.   Vertigo  Review interim events.  No improvement in dizziness after holding Femara  Dizziness associated with Femara is 3-14% (rare). 5.  Bone density on 08/01/2020. 6.   Bilateral mammogram on 08/03/2020. 7.   RTC on 07/04/2020 for labs (BMP) and Prolia. 8.   RTC after mammogram for MD assess, labs (CBC with diff, CMP,  CA27.29), and review of imaging.  I discussed the assessment and treatment plan with the patient.  The patient was provided an opportunity to ask questions and all were answered.  The patient agreed with the plan and demonstrated an understanding of the instructions.  The patient was advised to call back if the symptoms worsen or if the condition fails to improve as anticipated.  I provided 18 minutes (9:58 AM - 10:16 AM) of face-to-face time during this encounter and > 50% was spent counseling as documented under my assessment and plan.    Lequita Asal, MD, PhD    04/05/2020, 10:14 AM  I, Jacqualyn Posey, am acting as a Education administrator for [employer]. Mike Gip, MD.   I, Ayen Viviano C. Mike Gip, MD, have reviewed the above documentation for accuracy and completeness, and I agree with the above.

## 2020-04-05 ENCOUNTER — Inpatient Hospital Stay: Payer: Medicare Other | Attending: Hematology and Oncology | Admitting: Hematology and Oncology

## 2020-04-05 ENCOUNTER — Inpatient Hospital Stay: Payer: Medicare Other

## 2020-04-05 ENCOUNTER — Encounter: Payer: Self-pay | Admitting: Hematology and Oncology

## 2020-04-05 VITALS — BP 140/90 | HR 74 | Temp 97.1°F | Resp 18 | Wt 141.1 lb

## 2020-04-05 DIAGNOSIS — Z7982 Long term (current) use of aspirin: Secondary | ICD-10-CM | POA: Insufficient documentation

## 2020-04-05 DIAGNOSIS — R42 Dizziness and giddiness: Secondary | ICD-10-CM | POA: Insufficient documentation

## 2020-04-05 DIAGNOSIS — Z79811 Long term (current) use of aromatase inhibitors: Secondary | ICD-10-CM | POA: Insufficient documentation

## 2020-04-05 DIAGNOSIS — Z170 Estrogen receptor positive status [ER+]: Secondary | ICD-10-CM | POA: Insufficient documentation

## 2020-04-05 DIAGNOSIS — Z86711 Personal history of pulmonary embolism: Secondary | ICD-10-CM | POA: Diagnosis not present

## 2020-04-05 DIAGNOSIS — Z79899 Other long term (current) drug therapy: Secondary | ICD-10-CM | POA: Insufficient documentation

## 2020-04-05 DIAGNOSIS — M810 Age-related osteoporosis without current pathological fracture: Secondary | ICD-10-CM | POA: Diagnosis not present

## 2020-04-05 DIAGNOSIS — C50211 Malignant neoplasm of upper-inner quadrant of right female breast: Secondary | ICD-10-CM | POA: Insufficient documentation

## 2020-04-05 DIAGNOSIS — Z803 Family history of malignant neoplasm of breast: Secondary | ICD-10-CM | POA: Diagnosis not present

## 2020-04-05 DIAGNOSIS — Z923 Personal history of irradiation: Secondary | ICD-10-CM | POA: Diagnosis not present

## 2020-04-05 DIAGNOSIS — Z8249 Family history of ischemic heart disease and other diseases of the circulatory system: Secondary | ICD-10-CM | POA: Insufficient documentation

## 2020-04-05 DIAGNOSIS — R499 Unspecified voice and resonance disorder: Secondary | ICD-10-CM | POA: Diagnosis not present

## 2020-04-05 DIAGNOSIS — Z7901 Long term (current) use of anticoagulants: Secondary | ICD-10-CM | POA: Diagnosis not present

## 2020-04-05 DIAGNOSIS — I10 Essential (primary) hypertension: Secondary | ICD-10-CM | POA: Diagnosis not present

## 2020-04-05 LAB — CBC WITH DIFFERENTIAL/PLATELET
Abs Immature Granulocytes: 0.01 10*3/uL (ref 0.00–0.07)
Basophils Absolute: 0 10*3/uL (ref 0.0–0.1)
Basophils Relative: 1 %
Eosinophils Absolute: 0.1 10*3/uL (ref 0.0–0.5)
Eosinophils Relative: 3 %
HCT: 40 % (ref 36.0–46.0)
Hemoglobin: 13.4 g/dL (ref 12.0–15.0)
Immature Granulocytes: 0 %
Lymphocytes Relative: 31 %
Lymphs Abs: 1.2 10*3/uL (ref 0.7–4.0)
MCH: 31.9 pg (ref 26.0–34.0)
MCHC: 33.5 g/dL (ref 30.0–36.0)
MCV: 95.2 fL (ref 80.0–100.0)
Monocytes Absolute: 0.4 10*3/uL (ref 0.1–1.0)
Monocytes Relative: 10 %
Neutro Abs: 2.2 10*3/uL (ref 1.7–7.7)
Neutrophils Relative %: 55 %
Platelets: 185 10*3/uL (ref 150–400)
RBC: 4.2 MIL/uL (ref 3.87–5.11)
RDW: 13.1 % (ref 11.5–15.5)
WBC: 4 10*3/uL (ref 4.0–10.5)
nRBC: 0 % (ref 0.0–0.2)

## 2020-04-05 LAB — COMPREHENSIVE METABOLIC PANEL
ALT: 13 U/L (ref 0–44)
AST: 19 U/L (ref 15–41)
Albumin: 4.1 g/dL (ref 3.5–5.0)
Alkaline Phosphatase: 53 U/L (ref 38–126)
Anion gap: 8 (ref 5–15)
BUN: 21 mg/dL (ref 8–23)
CO2: 27 mmol/L (ref 22–32)
Calcium: 9.5 mg/dL (ref 8.9–10.3)
Chloride: 101 mmol/L (ref 98–111)
Creatinine, Ser: 0.71 mg/dL (ref 0.44–1.00)
GFR calc Af Amer: >60 mL/min (ref >60)
GFR calc non Af Amer: >60 mL/min (ref >60)
Glucose, Bld: 92 mg/dL (ref 70–99)
Potassium: 3.8 mmol/L (ref 3.5–5.1)
Sodium: 136 mmol/L (ref 135–145)
Total Bilirubin: 0.6 mg/dL (ref 0.3–1.2)
Total Protein: 7.2 g/dL (ref 6.5–8.1)

## 2020-04-05 NOTE — Progress Notes (Signed)
Patient notices a change in balance and lightheadedness due to letrozole. Dr Baruch Gouty told her to stop for 3 weeks. She started back on April 22nd. She said she couldn't tell a difference when stopping the medication. She has recently noticed a weakness in left leg. Cramping in hands and feet, mostly the left foot.

## 2020-04-06 LAB — CANCER ANTIGEN 27.29: CA 27.29: 22.4 U/mL (ref 0.0–38.6)

## 2020-04-14 ENCOUNTER — Other Ambulatory Visit: Payer: Medicare Other

## 2020-04-18 ENCOUNTER — Encounter: Payer: Self-pay | Admitting: Podiatry

## 2020-04-18 ENCOUNTER — Ambulatory Visit (INDEPENDENT_AMBULATORY_CARE_PROVIDER_SITE_OTHER): Payer: Medicare Other | Admitting: Podiatry

## 2020-04-18 ENCOUNTER — Other Ambulatory Visit: Payer: Self-pay

## 2020-04-18 DIAGNOSIS — Z86711 Personal history of pulmonary embolism: Secondary | ICD-10-CM | POA: Diagnosis not present

## 2020-04-18 DIAGNOSIS — M79676 Pain in unspecified toe(s): Secondary | ICD-10-CM

## 2020-04-18 DIAGNOSIS — Z7901 Long term (current) use of anticoagulants: Secondary | ICD-10-CM | POA: Diagnosis not present

## 2020-04-18 DIAGNOSIS — D689 Coagulation defect, unspecified: Secondary | ICD-10-CM

## 2020-04-18 DIAGNOSIS — Z86718 Personal history of other venous thrombosis and embolism: Secondary | ICD-10-CM | POA: Diagnosis not present

## 2020-04-18 DIAGNOSIS — B351 Tinea unguium: Secondary | ICD-10-CM | POA: Diagnosis not present

## 2020-04-18 LAB — POCT INR: INR: 2.5 (ref 2.0–3.0)

## 2020-04-18 NOTE — Progress Notes (Signed)
This patient returns to my office for at risk foot care.  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  This patient is unable to cut nails herself since the patient cannot reach her nails.These nails are painful walking and wearing shoes.  This patient presents for at risk foot care today.  General Appearance  Alert, conversant and in no acute stress.  Vascular  Dorsalis pedis and posterior tibial  pulses are palpable  bilaterally.  Capillary return is within normal limits  bilaterally. Temperature is within normal limits  bilaterally.  Neurologic  Senn-Weinstein monofilament wire test within normal limits  bilaterally. Muscle power within normal limits bilaterally.  Nails Thick disfigured discolored nails with subungual debris  from hallux to fifth toes bilaterally. No evidence of bacterial infection or drainage bilaterally.  Orthopedic  No limitations of motion  feet .  No crepitus or effusions noted.  No bony pathology or digital deformities noted.  Skin  normotropic skin with no porokeratosis noted bilaterally.  No signs of infections or ulcers noted.     Onychomycosis  Pain in right toes  Pain in left toes  Consent was obtained for treatment procedures.   Mechanical debridement of nails 1-5  bilaterally performed with a nail nipper.  Filed with dremel without incident.    Return office visit    3 months                  Told patient to return for periodic foot care and evaluation due to potential at risk complications.   Gardiner Barefoot DPM

## 2020-04-19 ENCOUNTER — Ambulatory Visit (INDEPENDENT_AMBULATORY_CARE_PROVIDER_SITE_OTHER): Payer: Medicare Other

## 2020-04-19 DIAGNOSIS — Z7901 Long term (current) use of anticoagulants: Secondary | ICD-10-CM | POA: Diagnosis not present

## 2020-04-19 NOTE — Patient Instructions (Addendum)
Pre visit review using our clinic review tool, if applicable. No additional management support is needed unless otherwise documented below in the visit note.  INR: 2.5. Today received email from [facility] with INR result. Contacted pt and advised to continue 7.5mg  daily except 0mg  on Wed. Recheck in 4 wks. Pt verbalized understanding.

## 2020-04-21 ENCOUNTER — Ambulatory Visit: Payer: Medicare Other | Admitting: Internal Medicine

## 2020-04-26 ENCOUNTER — Encounter: Payer: Self-pay | Admitting: Internal Medicine

## 2020-04-26 ENCOUNTER — Other Ambulatory Visit: Payer: Self-pay

## 2020-04-26 ENCOUNTER — Ambulatory Visit (INDEPENDENT_AMBULATORY_CARE_PROVIDER_SITE_OTHER): Payer: Medicare Other | Admitting: Internal Medicine

## 2020-04-26 VITALS — BP 144/84 | HR 64 | Temp 97.3°F | Ht 62.75 in | Wt 140.0 lb

## 2020-04-26 DIAGNOSIS — M810 Age-related osteoporosis without current pathological fracture: Secondary | ICD-10-CM

## 2020-04-26 DIAGNOSIS — I10 Essential (primary) hypertension: Secondary | ICD-10-CM | POA: Diagnosis not present

## 2020-04-26 DIAGNOSIS — Z86711 Personal history of pulmonary embolism: Secondary | ICD-10-CM | POA: Diagnosis not present

## 2020-04-26 DIAGNOSIS — Z86718 Personal history of other venous thrombosis and embolism: Secondary | ICD-10-CM | POA: Diagnosis not present

## 2020-04-26 DIAGNOSIS — Z0000 Encounter for general adult medical examination without abnormal findings: Secondary | ICD-10-CM | POA: Diagnosis not present

## 2020-04-26 DIAGNOSIS — Z170 Estrogen receptor positive status [ER+]: Secondary | ICD-10-CM

## 2020-04-26 DIAGNOSIS — C50211 Malignant neoplasm of upper-inner quadrant of right female breast: Secondary | ICD-10-CM | POA: Diagnosis not present

## 2020-04-26 DIAGNOSIS — E559 Vitamin D deficiency, unspecified: Secondary | ICD-10-CM

## 2020-04-26 DIAGNOSIS — G459 Transient cerebral ischemic attack, unspecified: Secondary | ICD-10-CM | POA: Diagnosis not present

## 2020-04-26 NOTE — Assessment & Plan Note (Signed)
Concern for residual dizziness Encouraged her to follow up with neurology Continue Atenolol, HCTZ, Lisinopril and ASA CBC and CMET today

## 2020-04-26 NOTE — Progress Notes (Signed)
HPI:  Pt resents to the clinic today for her subsequent annual Medicare wellness exam.  She is also due to follow-up chronic conditions.  Breast cancer, Right: In remission, s/p lumpectomy and radioactive seed implantation. She is taking Letrozole as prescribed. She follows with oncology.  Mammogram from 08/2019 reviewed.  History of TIA: No residual effect.  She is taking Atenolol, HCTZ, Lisinopril and Aspirin as prescribed.  She does not follow with neurology.  Seizures: Remote history, none recently.  She is not taking any antiseizure medications.  She does not follow with neurology.  History of DVT/PE: On lifelong Coumadin.  She follows with the Coumadin clinic.  Osteoporosis: She is receiving Prolia injections. Bone density from 07/2018 reviewed.  HTN: Her BP today is 144/84.  She is taking Atenolol, HCTZ and Lisinopril as prescribed.  ECG from 09/2018 reviewed.  Past Medical History:  Diagnosis Date  . Colon polyps   . Family history of breast cancer   . History of pulmonary embolism 2011  . Hx of melanoma of skin 2003   Right leg  . Hypertension   . Melanoma ([place]) 2003  . Personal history of radiation therapy   . Seizures ([place]) 1993   no meds since (approx) 2014  . Vertigo    x1. R/T perforated ear drum prior to 2013    Current Outpatient Medications  Medication Sig Dispense Refill  . aspirin 81 MG tablet Take 81 mg by mouth daily.    Marland Kitchen atenolol (TENORMIN) 25 MG tablet TAKE 1 TABLET BY MOUTH EVERY DAY 90 tablet 0  . Cholecalciferol (VITAMIN D3) 2000 UNITS TABS Take 2,000 Units by mouth daily.     . hydrochlorothiazide (MICROZIDE) 12.5 MG capsule TAKE 1 CAPSULE BY MOUTH EVERY DAY 90 capsule 0  . Iodoquinol-HC-Aloe Polysacch (ALCORTIN A) 1-2-1 % GEL Apply topically as needed (for rash).    Marland Kitchen letrozole (FEMARA) 2.5 MG tablet Take 1 tablet (2.5 mg total) by mouth daily. 90 tablet 3  . lisinopril (ZESTRIL) 10 MG tablet Take 1 tablet (10 mg total) by mouth daily. SCHEDULE  PHYSICAL 90 tablet 0  . polyethylene glycol (MIRALAX / GLYCOLAX) packet Take 17 g by mouth every other day.    . warfarin (COUMADIN) 5 MG tablet TAKE 1 TAB BY MOUTH DAILY ON FRIDAYS, 1/3 TABS ON MONDAYS, 1.5 TABS DAILY ON ALL OTHER DAYS. 90 tablet 0  . warfarin (COUMADIN) 7.5 MG tablet TAKE 1 TABLET EVERY DAY AND AS DIRECTED 90 tablet 1  . Wheat Dextrin (BENEFIBER PO) Take 2 each by mouth See admin instructions. Mix 2 teaspoons and drink 3 times daily     No current facility-administered medications for this visit.    Allergies  Allergen Reactions  . Clobetasol Other (See Comments)    vertigo  . Clobetasol Propionate Other (See Comments)    This is ointment form UNSPECIFIED REACTION   . Phenergan [Promethazine Hcl] Other (See Comments)    Rapid heart rate  . Dextromethorphan Palpitations and Other (See Comments)    promethazine with DM, rapid hearbeat  . Zithromax [Azithromycin] Palpitations and Other (See Comments)    Per OSH report, pt unable to recall Rapid heart rate    Family History  Problem Relation Age of Onset  . Heart disease Mother   . Aortic aneurysm Father   . Breast cancer Daughter 71  . Breast cancer Niece 75    Social History   Socioeconomic History  . Marital status: Married    Spouse name: Not  on file  . Number of children: Not on file  . Years of education: Not on file  . Highest education level: Not on file  Occupational History  . Not on file  Tobacco Use  . Smoking status: Never Smoker  . Smokeless tobacco: Never Used  Substance and Sexual Activity  . Alcohol use: No  . Drug use: Never  . Sexual activity: Never  Other Topics Concern  . Not on file  Social History Narrative   Recently moved from [place] to [facility].   Daughter and grandchildren live in [place].   Has other children in [place].   Retired Pharmacist, hospital.      Husband has been recovering from Prostate CA.   Social Determinants of Health   Financial Resource Strain:   .  Difficulty of Paying Living Expenses:   Food Insecurity:   . Worried About Charity fundraiser in the Last Year:   . Arboriculturist in the Last Year:   Transportation Needs:   . Film/video editor (Medical):   Marland Kitchen Lack of Transportation (Non-Medical):   Physical Activity:   . Days of Exercise per Week:   . Minutes of Exercise per Session:   Stress:   . Feeling of Stress :   Social Connections:   . Frequency of Communication with Friends and Family:   . Frequency of Social Gatherings with Friends and Family:   . Attends Religious Services:   . Active Member of Clubs or Organizations:   . Attends Archivist Meetings:   Marland Kitchen Marital Status:   Intimate Partner Violence:   . Fear of Current or Ex-Partner:   . Emotionally Abused:   Marland Kitchen Physically Abused:   . Sexually Abused:     Hospitiliaztions: None  Health Maintenance:    Flu: 08/2019  Tetanus: 05/2011  Pneumovax: 12/2000  Prevnar: 12/2016  Covid: 12/2019, 01/2020  Zostavax: 09/2012  Shingrix: never  Mammogram: 08/2019  Pap Smear: no longer screening  Bone Density: 07/2018  Colon Screening: no longer screening  Eye Doctor: annually  Dental Exam: annually   Providers:   PCP: [name], NP  Oncologist: Dr. Gerrit Friends  Podiatry: Dr. Prudence Davidson   I have personally reviewed and have noted:  1. The patient's medical and social history 2. Their use of alcohol, tobacco or illicit drugs 3. Their current medications and supplements 4. The patient's functional ability including ADL's, fall risks, home safety risks and hearing or visual impairment. 5. Diet and physical activities 6. Evidence for depression or mood disorder  Subjective:   Review of Systems:   Constitutional: Denies fever, malaise, fatigue, headache or abrupt weight changes.  HEENT: Denies eye pain, eye redness, ear pain, ringing in the ears, wax buildup, runny nose, nasal congestion, bloody nose, or sore throat. Respiratory: Denies difficulty breathing,  shortness of breath, cough or sputum production.   Cardiovascular: Denies chest pain, chest tightness, palpitations or swelling in the hands or feet.  Gastrointestinal: Pt reports intermittent reflux. Denies abdominal pain, bloating, constipation, diarrhea or blood in the stool.  GU: Denies urgency, frequency, pain with urination, burning sensation, blood in urine, odor or discharge. Musculoskeletal: Pt reports intermittent cramping in her hands and feet. Denies decrease in range of motion, difficulty with gait, or joint pain and swelling.  Skin: Denies redness, rashes, lesions or ulcercations.  Neurological: Pt reports dizziness. Denies difficulty with memory, difficulty with speech or problems with balance and coordination.  Psych: Denies anxiety, depression, SI/HI.  No other  specific complaints in a complete review of systems (except as listed in HPI above).  Objective:  PE:   BP (!) 144/84   Pulse 64   Temp (!) 97.3 F (36.3 C) (Temporal)   Ht 5' 2.75" (1.594 m)   Wt 140 lb (63.5 kg)   SpO2 97%   BMI 25.00 kg/m   Wt Readings from Last 3 Encounters:  04/05/20 141 lb 1.5 oz (64 kg)  03/03/20 142 lb (64.4 kg)  01/05/20 142 lb 12 oz (64.8 kg)    General: Appears her stated age, in NAD. Skin: Warm, dry and intact. No ulcerations noted. HEENT: Head: normal shape and size; Eyes: sclera white, no icterus, conjunctiva pink, PERRLA and EOMs intact;  Neck: Neck supple, trachea midline. No masses, lumps or thyromegaly present.  Cardiovascular: Normal rate and rhythm. S1,S2 noted.  No murmur, rubs or gallops noted. No JVD or BLE edema. No carotid bruits noted. Pulmonary/Chest: Normal effort and positive vesicular breath sounds. No respiratory distress. No wheezes, rales or ronchi noted.  Abdomen: Soft and nontender. Normal bowel sounds. No distention or masses noted. Liver, spleen and kidneys non palpable. Musculoskeletal:  Strength 5/5 BUE/BLE. No signs of joint swelling.   Neurological: Alert and oriented. Cranial nerves II-XII grossly intact. Coordination normal.  Psychiatric: Mood and affect normal. Behavior is normal. Judgment and thought content normal.    BMET    Component Value Date/Time   NA 136 04/05/2020 0836   NA 140 04/22/2013 0237   K 3.8 04/05/2020 0836   K 3.6 04/22/2013 0237   CL 101 04/05/2020 0836   CL 106 04/22/2013 0237   CO2 27 04/05/2020 0836   CO2 29 04/22/2013 0237   GLUCOSE 92 04/05/2020 0836   GLUCOSE 88 04/22/2013 0237   BUN 21 04/05/2020 0836   BUN 26 (H) 04/22/2013 0237   CREATININE 0.71 04/05/2020 0836   CREATININE 0.74 01/01/2014 1556   CALCIUM 9.5 04/05/2020 0836   CALCIUM 9.7 04/22/2013 0237   GFRNONAA >60 04/05/2020 0836   GFRNONAA >60 04/22/2013 0237   GFRAA >60 04/05/2020 0836   GFRAA >60 04/22/2013 0237    Lipid Panel     Component Value Date/Time   CHOL 225 (H) 03/31/2019 1404   TRIG 79.0 03/31/2019 1404   HDL 74.60 03/31/2019 1404   CHOLHDL 3 03/31/2019 1404   VLDL 15.8 03/31/2019 1404   LDLCALC 135 (H) 03/31/2019 1404    CBC    Component Value Date/Time   WBC 4.0 04/05/2020 0836   RBC 4.20 04/05/2020 0836   HGB 13.4 04/05/2020 0836   HGB 13.8 05/05/2014 1218   HCT 40.0 04/05/2020 0836   HCT 36.4 05/12/2014 0628   PLT 185 04/05/2020 0836   PLT 163 05/05/2014 1218   MCV 95.2 04/05/2020 0836   MCV 97 05/05/2014 1218   MCH 31.9 04/05/2020 0836   MCHC 33.5 04/05/2020 0836   RDW 13.1 04/05/2020 0836   RDW 13.6 05/05/2014 1218   LYMPHSABS 1.2 04/05/2020 0836   MONOABS 0.4 04/05/2020 0836   EOSABS 0.1 04/05/2020 0836   BASOSABS 0.0 04/05/2020 0836    Hgb A1C Lab Results  Component Value Date   HGBA1C 5.4 07/07/2017      Assessment and Plan:   Medicare Annual Wellness Visit:  Diet: She does eat meat. She consumes fruits and veggies daily. She tries to avoid fried foods. She drinks mostly coffee and water. Physical activity: Walking Depression/mood screen: Negative, PHQ 9 score  of 0 Hearing: Intact  to whispered voice Visual acuity: Grossly normal, performs annual eye exam  ADLs: Capable Fall risk: None Home safety: Good Cognitive evaluation: Intact to orientation, naming, recall and repetition EOL planning: Adv directives, DNR/ I agree  Preventative Medicine: Encouraged her to get a flu shot in the fall. Tetanus, pneumovax, prevnar, covid and zostovax UTD. She declines shingrix. Mammogram and bone density UTD. She no longer wants to screen for cervical or colon cancer. Encouraged her to consume a balanced diet and exercise regimen. Advised her to see an eye doctor and dentist annually. Will check CBC, CMET, Lipid and Vit D today. Due dates for screening exams given to pt as part of her AVS.   Next appointment: 1 year, Medicare Wellness Exam   [name], NP This visit occurred during the SARS-CoV-2 public health emergency.  Safety protocols were in place, including screening questions prior to the visit, additional usage of staff PPE, and extensive cleaning of exam room while observing appropriate contact time as indicated for disinfecting solutions.

## 2020-04-26 NOTE — Patient Instructions (Signed)

## 2020-04-26 NOTE — Assessment & Plan Note (Signed)
Continue Prolia Bone density scheduleded

## 2020-04-26 NOTE — Assessment & Plan Note (Signed)
Continue Coumadin CBC today She will get INR checks routinely

## 2020-04-26 NOTE — Assessment & Plan Note (Signed)
In remission Continue Letrozole Will follow mammograms She will continue to follow with oncology

## 2020-04-26 NOTE — Assessment & Plan Note (Signed)
Continue Atenolol, HCTZ and Lisinopril CMET today Will monitor

## 2020-04-27 LAB — CBC
HCT: 38 % (ref 36.0–46.0)
Hemoglobin: 12.8 g/dL (ref 12.0–15.0)
MCHC: 33.6 g/dL (ref 30.0–36.0)
MCV: 95.8 fl (ref 78.0–100.0)
Platelets: 191 10*3/uL (ref 150.0–400.0)
RBC: 3.97 Mil/uL (ref 3.87–5.11)
RDW: 13.6 % (ref 11.5–15.5)
WBC: 5.4 10*3/uL (ref 4.0–10.5)

## 2020-04-27 LAB — LIPID PANEL
Cholesterol: 236 mg/dL — ABNORMAL HIGH (ref 0–200)
HDL: 73.1 mg/dL (ref >39.00)
LDL Cholesterol: 147 mg/dL — ABNORMAL HIGH (ref 0–99)
NonHDL: 163.11
Total CHOL/HDL Ratio: 3
Triglycerides: 80 mg/dL (ref 0.0–149.0)
VLDL: 16 mg/dL (ref 0.0–40.0)

## 2020-04-27 LAB — COMPREHENSIVE METABOLIC PANEL
ALT: 11 U/L (ref 0–35)
AST: 16 U/L (ref 0–37)
Albumin: 4.2 g/dL (ref 3.5–5.2)
Alkaline Phosphatase: 51 U/L (ref 39–117)
BUN: 23 mg/dL (ref 6–23)
CO2: 29 mEq/L (ref 19–32)
Calcium: 9.8 mg/dL (ref 8.4–10.5)
Chloride: 101 mEq/L (ref 96–112)
Creatinine, Ser: 0.78 mg/dL (ref 0.40–1.20)
GFR: 70.41 mL/min (ref >60.00)
Glucose, Bld: 84 mg/dL (ref 70–99)
Potassium: 4.3 mEq/L (ref 3.5–5.1)
Sodium: 137 mEq/L (ref 135–145)
Total Bilirubin: 0.4 mg/dL (ref 0.2–1.2)
Total Protein: 6.4 g/dL (ref 6.0–8.3)

## 2020-04-27 LAB — VITAMIN D 25 HYDROXY (VIT D DEFICIENCY, FRACTURES): VITD: 49.13 ng/mL (ref 30.00–100.00)

## 2020-05-03 ENCOUNTER — Telehealth: Payer: Self-pay

## 2020-05-03 NOTE — Telephone Encounter (Signed)
Pt left v/m requesting cb about 04/26/20 lab results.

## 2020-05-19 ENCOUNTER — Ambulatory Visit (INDEPENDENT_AMBULATORY_CARE_PROVIDER_SITE_OTHER): Payer: Medicare Other

## 2020-05-19 DIAGNOSIS — Z7901 Long term (current) use of anticoagulants: Secondary | ICD-10-CM | POA: Diagnosis not present

## 2020-05-19 LAB — POCT INR: INR: 4.2 — AB (ref 2.0–3.0)

## 2020-05-19 NOTE — Patient Instructions (Signed)
Pre visit review using our clinic review tool, if applicable. No additional management support is needed unless otherwise documented below in the visit note. 

## 2020-05-20 ENCOUNTER — Telehealth: Payer: Self-pay

## 2020-05-20 NOTE — Telephone Encounter (Signed)
noted 

## 2020-05-20 NOTE — Telephone Encounter (Signed)
Discussed with Leafy Ro, RN, and pt is to hold dose for 2 days and then return to normal dosing. Recheck on 6/21.   Contacted Janci, RN, at [facility] and advised to retest Monday and again on Thurs of next week. Janci verbalized understanding.   Contacted pt and advised to hold dose today and tomorrow then return to normal dosing and recheck on Monday. Advised pt also to eat a salad or two. Pt verbalized understanding

## 2020-05-20 NOTE — Telephone Encounter (Signed)
[place] Night - Client TELEPHONE ADVICE RECORD [facility] Patient Name: Becky Farrell Gender: Female DOB: Jun 28, 1934 Age: 86 Y 47 M 35 D Return Phone Number: [phone] (Primary) Address: City/State/Zip: [place] [address] Client [facility] Night - Client Client Site [place] Physician [name] - NP Contact Type Call Who Is Calling Patient / Member / Family / Caregiver Call Type Triage / Clinical Relationship To Patient Self Return Phone Number [phone] (Primary) Chief Complaint OVERDOSE took too much medication at once Reason for Call Symptomatic / Request for [place] states her Warfarin has been adjusted. She is supposed to take none tonight but on Friday she takes a half. She took a whole pill tonight. What should she do? Translation No Nurse Assessment Nurse: Rufina Falco, RN, Traci Date/Time (Eastern Time): 05/19/2020 9:41:56 PM Confirm and document reason for call. If symptomatic, describe symptoms. ---Caller states her Warfarin has been adjusted. She is supposed to take none tonight but on Friday she takes a half. She took a whole pill tonight. Took it at around [place]. Has the patient had close contact with a person known or suspected to have the novel coronavirus illness OR traveled / lives in area with major community spread (including international travel) in the last 14 days from the onset of symptoms? * If Asymptomatic, screen for exposure and travel within the last 14 days. ---No Does the patient have any new or worsening symptoms? ---Yes Will a triage be completed? ---Yes Related visit to physician within the last 2 weeks? ---No Does the PT have any chronic conditions? (i.e. diabetes, asthma, this includes High risk factors for pregnancy, etc.) ---Yes List chronic conditions. ---blood clots Is this a behavioral health or substance abuse  call? ---No Guidelines Guideline Title Affirmed Question Affirmed Notes Nurse Date/Time (Eastern Time) Poisoning [1] DOUBLE DOSE (an extra dose or lesser amount) of prescription drug AND [2] NO symptoms (Exception: [place], RN, Traci 05/19/2020 9:44:41 PM PLEASE NOTE: All timestamps contained within this report are represented as [place] Standard Time. CONFIDENTIALTY NOTICE: This fax transmission is intended only for the addressee. It contains information that is legally privileged, confidential or otherwise protected from use or disclosure. If you are not the intended recipient, you are strictly prohibited from reviewing, disclosing, copying using or disseminating any of this information or taking any action in reliance on or regarding this information. If you have received this fax in error, please notify us immediately by telephone so that we can arrange for its return to [place]. Phone: [phone], Toll-Free: [phone], Fax: [phone] Page: 2 of 2 Call Id: [id number] Guidelines Guideline Title Affirmed Question Affirmed Notes Nurse Date/Time Eilene Ghazi Time) a double dose of antibiotics) Disp. Time Eilene Ghazi Time) Disposition Final User 05/19/2020 9:38:48 PM Send to Urgent Earley Favor, April 05/19/2020 9:48:20 PM Paged On Call back to [facility], [place], Traci 05/19/2020 10:06:04 PM Paged On Call back to [facility], [place], [place] 05/19/2020 10:15:31 PM Paged On Call back to [facility], RN, [place] 05/19/2020 9:46:25 PM Call PCP Now Yes Rufina Falco, RN, Traci Caller Disagree/Comply Comply Caller Understands Yes PreDisposition Did not know what to do Care Advice Given Per Guideline CALL PCP NOW: * I'll page the on-call provider now. If you haven't heard from the provider (or me) within 30 minutes, call again. Paging DoctorName Phone DateTime Result/Outcome Message Type Notes McClean-Scocuzza, Olivia Mackie [phone] 05/19/2020 9:48:20 PM Paged On Call Back to  Call  Center Doctor Paged McClean-Scocuzza, Olivia Mackie [phone] 05/19/2020 10:06:04 PM Called on Call provider - No message left Doctor Paged McClean-ScocuzzaOlivia Mackie [phone] 05/19/2020 10:15:31 PM Paged On Call Back to Call Center Doctor Paged McClean-Scocuzza, Olivia Mackie 05/19/2020 10:24:22 PM Unable to Reach on call - Max Attempts Message Result

## 2020-05-23 ENCOUNTER — Ambulatory Visit (INDEPENDENT_AMBULATORY_CARE_PROVIDER_SITE_OTHER): Payer: Medicare Other

## 2020-05-23 DIAGNOSIS — Z7901 Long term (current) use of anticoagulants: Secondary | ICD-10-CM

## 2020-05-23 LAB — POCT INR: INR: 1.3 — AB (ref 2.0–3.0)

## 2020-05-23 NOTE — Patient Instructions (Addendum)
Pre visit review using our clinic review tool, if applicable. No additional management support is needed unless otherwise documented below in the visit note.  Increase dose today and tomorrow to 11.25mg  then continue taking 7.5mg  daily except take 0mg  on Wednesday. Pt verbalized understanding.  Sent email letting [facility] when pt needed tested again.

## 2020-05-30 ENCOUNTER — Ambulatory Visit (INDEPENDENT_AMBULATORY_CARE_PROVIDER_SITE_OTHER): Payer: Medicare Other

## 2020-05-30 DIAGNOSIS — D802 Selective deficiency of immunoglobulin A [IgA]: Secondary | ICD-10-CM | POA: Diagnosis not present

## 2020-05-30 DIAGNOSIS — Z7901 Long term (current) use of anticoagulants: Secondary | ICD-10-CM | POA: Diagnosis not present

## 2020-05-30 LAB — PROTIME-INR: INR: 2.7 — AB (ref 0.9–1.1)

## 2020-05-30 LAB — POCT INR: INR: 2.7 (ref 2.0–3.0)

## 2020-05-30 NOTE — Patient Instructions (Addendum)
Pre visit review using our clinic review tool, if applicable. No additional management support is needed unless otherwise documented below in the visit note.  Continue taking 7.5mg daily except take 0mg on Wednesday. Recheck in 4 wks.Advised pt of result. Pt verbalized understanding. Pt denies AVS being mailed.  

## 2020-05-31 ENCOUNTER — Other Ambulatory Visit: Payer: Self-pay | Admitting: Internal Medicine

## 2020-06-16 DIAGNOSIS — R42 Dizziness and giddiness: Secondary | ICD-10-CM | POA: Diagnosis not present

## 2020-06-16 DIAGNOSIS — H90A22 Sensorineural hearing loss, unilateral, left ear, with restricted hearing on the contralateral side: Secondary | ICD-10-CM | POA: Diagnosis not present

## 2020-06-16 DIAGNOSIS — H6123 Impacted cerumen, bilateral: Secondary | ICD-10-CM | POA: Diagnosis not present

## 2020-06-30 DIAGNOSIS — Z86718 Personal history of other venous thrombosis and embolism: Secondary | ICD-10-CM | POA: Diagnosis not present

## 2020-06-30 DIAGNOSIS — Z86711 Personal history of pulmonary embolism: Secondary | ICD-10-CM | POA: Diagnosis not present

## 2020-06-30 LAB — PROTIME-INR

## 2020-06-30 LAB — POCT INR: INR: 2.7 (ref 2.0–3.0)

## 2020-07-01 ENCOUNTER — Ambulatory Visit (INDEPENDENT_AMBULATORY_CARE_PROVIDER_SITE_OTHER): Payer: Medicare Other

## 2020-07-01 DIAGNOSIS — Z7901 Long term (current) use of anticoagulants: Secondary | ICD-10-CM

## 2020-07-01 NOTE — Patient Instructions (Addendum)
Pre visit review using our clinic review tool, if applicable. No additional management support is needed unless otherwise documented below in the visit note.  Continue taking 7.5mg  daily except take 0mg  on Wednesday. Recheck in 3 wks.Advised pt of result. Pt verbalized understanding. Pt denies AVS being mailed.  Sent email letting [facility] when pt needed tested again.

## 2020-07-04 ENCOUNTER — Ambulatory Visit: Payer: Medicare Other

## 2020-07-04 ENCOUNTER — Other Ambulatory Visit: Payer: Self-pay

## 2020-07-04 ENCOUNTER — Other Ambulatory Visit: Payer: Self-pay | Admitting: Hematology and Oncology

## 2020-07-04 ENCOUNTER — Inpatient Hospital Stay: Payer: Medicare Other | Attending: Hematology and Oncology

## 2020-07-04 VITALS — BP 134/86 | HR 72 | Temp 98.0°F | Resp 18

## 2020-07-04 DIAGNOSIS — Z8249 Family history of ischemic heart disease and other diseases of the circulatory system: Secondary | ICD-10-CM | POA: Insufficient documentation

## 2020-07-04 DIAGNOSIS — Z79899 Other long term (current) drug therapy: Secondary | ICD-10-CM | POA: Diagnosis not present

## 2020-07-04 DIAGNOSIS — I10 Essential (primary) hypertension: Secondary | ICD-10-CM | POA: Diagnosis not present

## 2020-07-04 DIAGNOSIS — M810 Age-related osteoporosis without current pathological fracture: Secondary | ICD-10-CM | POA: Insufficient documentation

## 2020-07-04 DIAGNOSIS — Z923 Personal history of irradiation: Secondary | ICD-10-CM | POA: Insufficient documentation

## 2020-07-04 DIAGNOSIS — R42 Dizziness and giddiness: Secondary | ICD-10-CM | POA: Diagnosis not present

## 2020-07-04 DIAGNOSIS — Z7901 Long term (current) use of anticoagulants: Secondary | ICD-10-CM | POA: Insufficient documentation

## 2020-07-04 DIAGNOSIS — Z803 Family history of malignant neoplasm of breast: Secondary | ICD-10-CM | POA: Diagnosis not present

## 2020-07-04 DIAGNOSIS — Z170 Estrogen receptor positive status [ER+]: Secondary | ICD-10-CM | POA: Insufficient documentation

## 2020-07-04 DIAGNOSIS — C50211 Malignant neoplasm of upper-inner quadrant of right female breast: Secondary | ICD-10-CM | POA: Diagnosis not present

## 2020-07-04 DIAGNOSIS — Z79811 Long term (current) use of aromatase inhibitors: Secondary | ICD-10-CM | POA: Diagnosis not present

## 2020-07-04 DIAGNOSIS — Z86711 Personal history of pulmonary embolism: Secondary | ICD-10-CM | POA: Diagnosis not present

## 2020-07-04 DIAGNOSIS — Z7982 Long term (current) use of aspirin: Secondary | ICD-10-CM | POA: Diagnosis not present

## 2020-07-04 DIAGNOSIS — R499 Unspecified voice and resonance disorder: Secondary | ICD-10-CM | POA: Insufficient documentation

## 2020-07-04 LAB — BASIC METABOLIC PANEL
Anion gap: 5 (ref 5–15)
BUN: 18 mg/dL (ref 8–23)
CO2: 26 mmol/L (ref 22–32)
Calcium: 9.2 mg/dL (ref 8.9–10.3)
Chloride: 103 mmol/L (ref 98–111)
Creatinine, Ser: 0.73 mg/dL (ref 0.44–1.00)
GFR calc Af Amer: >60 mL/min (ref >60)
GFR calc non Af Amer: >60 mL/min (ref >60)
Glucose, Bld: 90 mg/dL (ref 70–99)
Potassium: 4.1 mmol/L (ref 3.5–5.1)
Sodium: 134 mmol/L — ABNORMAL LOW (ref 135–145)

## 2020-07-04 MED ORDER — DENOSUMAB 60 MG/ML ~~LOC~~ SOSY
60.0000 mg | PREFILLED_SYRINGE | Freq: Once | SUBCUTANEOUS | Status: AC
  Administered 2020-07-04: 60 mg via SUBCUTANEOUS
  Filled 2020-07-04: qty 1

## 2020-07-21 ENCOUNTER — Other Ambulatory Visit: Payer: Self-pay

## 2020-07-21 ENCOUNTER — Encounter: Payer: Self-pay | Admitting: Podiatry

## 2020-07-21 ENCOUNTER — Ambulatory Visit (INDEPENDENT_AMBULATORY_CARE_PROVIDER_SITE_OTHER): Payer: Medicare Other | Admitting: Podiatry

## 2020-07-21 ENCOUNTER — Telehealth: Payer: Self-pay

## 2020-07-21 DIAGNOSIS — M79676 Pain in unspecified toe(s): Secondary | ICD-10-CM

## 2020-07-21 DIAGNOSIS — D689 Coagulation defect, unspecified: Secondary | ICD-10-CM

## 2020-07-21 DIAGNOSIS — B351 Tinea unguium: Secondary | ICD-10-CM | POA: Diagnosis not present

## 2020-07-21 NOTE — Telephone Encounter (Signed)
Pt called to report she tried to get her INR drawn by [facility] this morning but they did not have an order. Advised this nurse will f/u. She said she could go on Monday if she had an order.  Advised this nurse will f/u  Tried to contact Janci, nurse at [facility] but she is out of office until Monday. Email was sent after last result that pt should have INR drawn on 8/19 but no order was placed by Janci. [place] transferred call to [facility], at [phone], in [place] at [facility] and she said she would put the order in.  Contacted pt and advised order will be there for Monday.

## 2020-07-21 NOTE — Progress Notes (Signed)
This patient returns to my office for at risk foot care.  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  This patient is unable to cut nails herself since the patient cannot reach her nails.These nails are painful walking and wearing shoes.  This patient presents for at risk foot care today.  General Appearance  Alert, conversant and in no acute stress.  Vascular  Dorsalis pedis and posterior tibial  pulses are palpable  bilaterally.  Capillary return is within normal limits  bilaterally. Temperature is within normal limits  bilaterally.  Neurologic  Senn-Weinstein monofilament wire test within normal limits  bilaterally. Muscle power within normal limits bilaterally.  Nails Thick disfigured discolored nails with subungual debris  from hallux to fifth toes bilaterally. No evidence of bacterial infection or drainage bilaterally.  Orthopedic  No limitations of motion  feet .  No crepitus or effusions noted.  No bony pathology or digital deformities noted.  Skin  normotropic skin with no porokeratosis noted bilaterally.  No signs of infections or ulcers noted.     Onychomycosis  Pain in right toes  Pain in left toes  Consent was obtained for treatment procedures.   Mechanical debridement of nails 1-5  bilaterally performed with a nail nipper.  Filed with dremel without incident.    Return office visit    3 months                  Told patient to return for periodic foot care and evaluation due to potential at risk complications.   Gardiner Barefoot DPM

## 2020-07-25 DIAGNOSIS — Z86711 Personal history of pulmonary embolism: Secondary | ICD-10-CM | POA: Diagnosis not present

## 2020-07-25 DIAGNOSIS — Z7901 Long term (current) use of anticoagulants: Secondary | ICD-10-CM | POA: Diagnosis not present

## 2020-07-25 DIAGNOSIS — Z86718 Personal history of other venous thrombosis and embolism: Secondary | ICD-10-CM | POA: Diagnosis not present

## 2020-07-25 LAB — POCT INR: INR: 3.1 — AB (ref 2.0–3.0)

## 2020-07-26 ENCOUNTER — Ambulatory Visit (INDEPENDENT_AMBULATORY_CARE_PROVIDER_SITE_OTHER): Payer: Medicare Other

## 2020-07-26 ENCOUNTER — Other Ambulatory Visit: Payer: Self-pay | Admitting: Nurse Practitioner

## 2020-07-26 DIAGNOSIS — Z7901 Long term (current) use of anticoagulants: Secondary | ICD-10-CM

## 2020-07-26 DIAGNOSIS — Z170 Estrogen receptor positive status [ER+]: Secondary | ICD-10-CM

## 2020-07-26 NOTE — Patient Instructions (Addendum)
Pre visit review using our clinic review tool, if applicable. No additional management support is needed unless otherwise documented below in the visit note.  Take 3.75mg  today then continue taking 7.5mg  daily except take 0mg  on Wednesday. Recheck in 4 wks.Advised pt of result. Pt verbalized understanding. Pt denies AVS being mailed.

## 2020-08-09 ENCOUNTER — Ambulatory Visit
Admission: RE | Admit: 2020-08-09 | Discharge: 2020-08-09 | Disposition: A | Discharge status: Home / Self Care | Payer: Medicare Other | Source: Ambulatory Visit | Attending: Hematology and Oncology | Admitting: Hematology and Oncology

## 2020-08-09 DIAGNOSIS — Z170 Estrogen receptor positive status [ER+]: Secondary | ICD-10-CM | POA: Insufficient documentation

## 2020-08-09 DIAGNOSIS — M810 Age-related osteoporosis without current pathological fracture: Secondary | ICD-10-CM

## 2020-08-09 DIAGNOSIS — R928 Other abnormal and inconclusive findings on diagnostic imaging of breast: Secondary | ICD-10-CM | POA: Diagnosis not present

## 2020-08-09 DIAGNOSIS — Z780 Asymptomatic menopausal state: Secondary | ICD-10-CM | POA: Diagnosis not present

## 2020-08-09 DIAGNOSIS — M85851 Other specified disorders of bone density and structure, right thigh: Secondary | ICD-10-CM | POA: Diagnosis not present

## 2020-08-09 DIAGNOSIS — C50211 Malignant neoplasm of upper-inner quadrant of right female breast: Secondary | ICD-10-CM | POA: Diagnosis not present

## 2020-08-09 NOTE — Progress Notes (Signed)
[facility]  [address] [place], [place] [address] Phone: [phone]  Fax: [phone]   Clinic Day:  08/10/2020  Referring physician: Jearld Fenton, NP  Chief Complaint: Becky Farrell is a 86 y.o. female with stage I right breast cancer who is seen for 4 month assessment.   HPI: The patient was last seen in the medical oncology clinic on 04/05/2020. At that time, she had some vertigo and dizziness which did not improve after holding letrozole.  Exam was stable. Hematocrit was 40.0, hemoglobin 13.4, platelets 185,000, WBC 4,000. CMP was normal. CA27.29 was 22.4.  Bilateral diagnostic mammogram on 08/09/2020 revealed no mammographic evidence of malignancy.  Bone density on 08/09/2020 revealed osteoporosis with a T-score of -3.2 in the forearm radius.  Right femoral neck revealed osteopenia with a T-score of -2.0.  She received Prolia on 07/04/2020.  During the interim, she has been "good." She states that her balance is still off due to left leg weakness and lightheadedness. She would like to get back into walking and aquatics. Her appetite has been a bit decreased. She has noticed changes in her singing voice. She has some cramping in her toes and feet at night. She denies back pain or any recent falls. She takes Coumadin 7.5 mg 6 days/week. She remains on letrozole and has no breast concerns.   Past Medical History:  Diagnosis Date  . Colon polyps   . Family history of breast cancer   . History of pulmonary embolism 2011  . Hx of melanoma of skin 2003   Right leg  . Hypertension   . Melanoma ([place]) 2003  . Personal history of radiation therapy   . Seizures ([place]) 1993   no meds since (approx) 2014  . Vertigo    x1. R/T perforated ear drum prior to 2013    Past Surgical History:  Procedure Laterality Date  . ABDOMINAL HYSTERECTOMY  05/2014  . BREAST BIOPSY Right 08/20/2018   invasive mammary carcinoma/DCIS   . BREAST LUMPECTOMY  Right 10/10/2018  . BREAST LUMPECTOMY WITH RADIOACTIVE SEED LOCALIZATION Right 10/10/2018   Procedure: BREAST LUMPECTOMY WITH RADIOACTIVE SEED LOCALIZATION;  Surgeon: Jovita Kussmaul, MD;  Location: [facility];  Service: General;  Laterality: Right;  . CATARACT EXTRACTION W/PHACO Right 01/06/2018   Procedure: CATARACT EXTRACTION PHACO AND INTRAOCULAR LENS PLACEMENT ([place]) RIGHT;  Surgeon: Leandrew Koyanagi, MD;  Location: [facility];  Service: Ophthalmology;  Laterality: Right;  . CATARACT EXTRACTION W/PHACO Left 01/29/2018   Procedure: CATARACT EXTRACTION PHACO AND INTRAOCULAR LENS PLACEMENT ([place]) LEFT;  Surgeon: Leandrew Koyanagi, MD;  Location: [facility];  Service: Ophthalmology;  Laterality: Left;  . left hand surgery  05/2011   s/p fall  . OTHER SURGICAL HISTORY  2003   Removal of lymphoma from melonoma 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     Family History  Problem Relation Age of Onset  . Heart disease Mother   . Aortic aneurysm Father   . Breast cancer Daughter 2  . Breast cancer Niece 5    Social History:  reports that she has never smoked. She has never used smokeless tobacco. She reports that she does not drink alcohol and does not use drugs. The patient recently moved from [place] to [place]. Her daughter and grandchildren live in [place]. She has other children in [place]. Her husband has been recovering from prostate cancer. She is a retired Pharmacist, hospital. 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   Allergies:  Allergies  Allergen  Reactions  . Clobetasol Other (See Comments)    vertigo  . Clobetasol Propionate Other (See Comments)    This is ointment form UNSPECIFIED REACTION   . Phenergan [Promethazine Hcl] Other (See Comments)    Rapid heart rate  . Dextromethorphan Palpitations and Other (See Comments)    promethazine with DM, rapid hearbeat  . Zithromax [Azithromycin] Palpitations and Other (See Comments)    Per OSH report, pt unable to recall Rapid heart rate     Current Medications: Current Outpatient Medications  Medication Sig Dispense Refill  . aspirin 81 MG tablet Take 81 mg by mouth daily.    Marland Kitchen atenolol (TENORMIN) 25 MG tablet TAKE 1 TABLET BY MOUTH EVERY DAY 90 tablet 2  . Cholecalciferol (VITAMIN D3) 2000 UNITS TABS Take 2,000 Units by mouth daily.     Marland Kitchen denosumab (PROLIA) 60 MG/ML SOSY injection Inject 60 mg into the skin every 6 (six) months.    . hydrochlorothiazide (MICROZIDE) 12.5 MG capsule TAKE 1 CAPSULE BY MOUTH EVERY DAY 90 capsule 2  . Iodoquinol-HC-Aloe Polysacch (ALCORTIN A) 1-2-1 % GEL Apply topically as needed (for rash).    Marland Kitchen letrozole (FEMARA) 2.5 MG tablet TAKE 1 TABLET BY MOUTH EVERY DAY 90 tablet 3  . lisinopril (ZESTRIL) 10 MG tablet Take 1 tablet (10 mg total) by mouth daily. 90 tablet 2  . polyethylene glycol (MIRALAX / GLYCOLAX) packet Take 17 g by mouth every other day.    . warfarin (COUMADIN) 7.5 MG tablet TAKE 1 TABLET EVERY DAY AND AS DIRECTED (Patient taking differently: Take 7.5 mg by mouth. Every day except for Wed) 90 tablet 1  . Wheat Dextrin (BENEFIBER PO) Take 2 each by mouth See admin instructions. Mix 2 teaspoons and drink 3 times daily     No current facility-administered medications for this visit.    Review of Systems  Constitutional: Positive for weight loss (2 lbs). Negative for chills, diaphoresis, fever and malaise/fatigue.       Feels "good".  HENT: Positive for hearing loss. Negative for congestion, ear discharge, ear pain, nosebleeds, sinus pain, sore throat and tinnitus.        Change in singing voice.  Eyes: Negative.  Negative for blurred vision, double vision and photophobia.  Respiratory: Negative for cough, hemoptysis, sputum production and shortness of breath.        Recurrent PE x 2.  Cardiovascular: Negative.  Negative for chest pain, palpitations, orthopnea, leg swelling and PND.  Gastrointestinal: Negative.  Negative for abdominal pain, blood in stool, constipation, diarrhea,  heartburn, melena, nausea and vomiting.       Poor appetite.  Genitourinary: Negative.  Negative for dysuria, frequency, hematuria and urgency.  Musculoskeletal: Positive for myalgias (foot/toe cramping at night). Negative for back pain, falls, joint pain and neck pain.       Osteoporosis.  Skin: Negative.  Negative for itching and rash.  Neurological: Positive for dizziness and weakness (left leg). Negative for tingling, tremors, sensory change, speech change, focal weakness, seizures (history) and headaches.       Chronic balance issues.  Endo/Heme/Allergies: Does not bruise/bleed easily (on Coumadin).  Psychiatric/Behavioral: Negative.  Negative for depression and memory loss. The patient is not nervous/anxious and does not have insomnia.   All other systems reviewed and are negative.  Performance status (ECOG): 1  Vitals Height 5' 2.75" (1.594 m), weight 139 lb 10.9 oz (63.4 kg).   Physical Exam Vitals and nursing note reviewed.  Constitutional:  General: She is not in acute distress.    Appearance: She is well-developed. She is not diaphoretic.  HENT:     Head: Normocephalic and atraumatic.     Mouth/Throat:     Mouth: Mucous membranes are moist.     Pharynx: Oropharynx is clear. No oropharyngeal exudate.  Eyes:     General: No scleral icterus.    Extraocular Movements: Extraocular movements intact.     Conjunctiva/sclera: Conjunctivae normal.     Pupils: Pupils are equal, round, and reactive to light.     Comments: Glasses. Blue eyes.  Neck:     Vascular: No JVD.  Cardiovascular:     Rate and Rhythm: Normal rate and regular rhythm.     Heart sounds: Normal heart sounds. No murmur heard.   Pulmonary:     Effort: Pulmonary effort is normal. No respiratory distress.     Breath sounds: Normal breath sounds. No wheezing or rales.  Chest:     Chest wall: No tenderness.     Breasts:        Right: Tenderness present. No mass or skin change.        Left: No mass or skin  change.  Abdominal:     General: Bowel sounds are normal. There is no distension.     Palpations: Abdomen is soft. There is no mass.     Tenderness: There is no abdominal tenderness. There is no guarding or rebound.  Musculoskeletal:        General: No swelling or tenderness. Normal range of motion.     Cervical back: Normal range of motion and neck supple.  Lymphadenopathy:     Head:     Right side of head: No preauricular, posterior auricular or occipital adenopathy.     Left side of head: No preauricular, posterior auricular or occipital adenopathy.     Cervical: No cervical adenopathy.     Upper Body:     Right upper body: No supraclavicular or axillary adenopathy.     Left upper body: No supraclavicular or axillary adenopathy.     Lower Body: No right inguinal adenopathy. No left inguinal adenopathy.  Skin:    General: Skin is warm and dry.  Neurological:     Mental Status: She is alert and oriented to person, place, and time.     Comments: No leg weakness. Strength is symmetrical.  Psychiatric:        Behavior: Behavior normal.        Thought Content: Thought content normal.        Judgment: Judgment normal.    Appointment on 08/10/2020  Component Date Value Ref Range Status  . Sodium 08/10/2020 138  135 - 145 mmol/L Final  . Potassium 08/10/2020 4.0  3.5 - 5.1 mmol/L Final  . Chloride 08/10/2020 99  98 - 111 mmol/L Final  . CO2 08/10/2020 28  22 - 32 mmol/L Final  . Glucose, Bld 08/10/2020 93  70 - 99 mg/dL Final   Glucose reference range applies only to samples taken after fasting for at least 8 hours.  . BUN 08/10/2020 20  8 - 23 mg/dL Final  . Creatinine, Ser 08/10/2020 0.78  0.44 - 1.00 mg/dL Final  . Calcium 08/10/2020 9.6  8.9 - 10.3 mg/dL Final  . Total Protein 08/10/2020 7.0  6.5 - 8.1 g/dL Final  . Albumin 08/10/2020 3.9  3.5 - 5.0 g/dL Final  . AST 08/10/2020 19  15 - 41 U/L Final  . ALT  08/10/2020 16  0 - 44 U/L Final  . Alkaline Phosphatase 08/10/2020 50   38 - 126 U/L Final  . Total Bilirubin 08/10/2020 0.6  0.3 - 1.2 mg/dL Final  . GFR calc non Af Amer 08/10/2020 >60  >60 mL/min Final  . GFR calc Af Amer 08/10/2020 >60  >60 mL/min Final  . Anion gap 08/10/2020 11  5 - 15 Final   Performed at [facility], [address], [place], [place] [address]  . WBC 08/10/2020 5.0  4.0 - 10.5 K/uL Final  . RBC 08/10/2020 4.03  3.87 - 5.11 MIL/uL Final  . Hemoglobin 08/10/2020 12.9  12.0 - 15.0 g/dL Final  . HCT 08/10/2020 37.9  36 - 46 % Final  . MCV 08/10/2020 94.0  80.0 - 100.0 fL Final  . MCH 08/10/2020 32.0  26.0 - 34.0 pg Final  . MCHC 08/10/2020 34.0  30.0 - 36.0 g/dL Final  . RDW 08/10/2020 13.0  11.5 - 15.5 % Final  . Platelets 08/10/2020 200  150 - 400 K/uL Final  . nRBC 08/10/2020 0.0  0.0 - 0.2 % Final  . Neutrophils Relative % 08/10/2020 66  % Final  . Neutro Abs 08/10/2020 3.3  1.7 - 7.7 K/uL Final  . Lymphocytes Relative 08/10/2020 22  % Final  . Lymphs Abs 08/10/2020 1.1  0.7 - 4.0 K/uL Final  . Monocytes Relative 08/10/2020 9  % Final  . Monocytes Absolute 08/10/2020 0.5  0 - 1 K/uL Final  . Eosinophils Relative 08/10/2020 3  % Final  . Eosinophils Absolute 08/10/2020 0.2  0 - 0 K/uL Final  . Basophils Relative 08/10/2020 0  % Final  . Basophils Absolute 08/10/2020 0.0  0 - 0 K/uL Final  . Immature Granulocytes 08/10/2020 0  % Final  . Abs Immature Granulocytes 08/10/2020 0.01  0.00 - 0.07 K/uL Final   Performed at [facility], [address] [place], [place] [address]    Assessment:  Becky Farrell is a 86 y.o. female with stage T1aNx right breast cancers/p lumpectomy on 10/10/2018. Pathology revealed a 0.4 cm grade I invasive ductal carcinoma with mucinous features. There was intermediate grade DCIS. Margins were uninvolved (1.0 cm posterior margin). There were fibrocystic changes including apocrine metaplasia. Tumor was ER + (100%), PR + (90%), and Her2/neu-. Ki67 was 10%. Pathologic stagewas  T1aNx. CA27.29was 14.1 on 09/08/2018.  Bilateral screening mammogramon 07/31/2018 revealed a possible mass in the right breast. Right diagnostic mammogramand targeted ultrasound on 08/13/2018 revealed a 4 x 5 x 8 mm indeterminate mass at the 10 o'clock position 9 cm from the nipple in the right breast.  Bilateral diagnostic mammogram on 08/04/2019 revealed no evidence of malignancy in either breast. There were lumpectomy changes on the right.  Bilateral diagnostic mammogram on 08/09/2020 revealed no mammographic evidence of malignancy.  Shereceivedradiationfrom 12/16/2018- 02/03/2019.She received5040cGyin 28 fractions with a scar boost of another 1400 cGyusing electron beam.  She began Femara on 06/09/2019.  CA27.29 has been followed: 14.1 on 09/08/2018, 14.4 on 04/09/2019, 18.1 on 07/07/2019, 19.3 on 10/06/2019, 21.0 on 01/05/2020, 22.4 in 04/05/2020, and 19.1 on 08/10/2020.  Bone densityon 07/31/2018 revealed osteoporosiswith a T-score of -3.4 in the right forearm radius and -1.9 in the right femoral neck.  Bone density on 08/09/2020 revealed osteoporosis with a T-score of -3.2 in the forearm radius.  Right femoral neck revealed osteopenia with a T-score of -2.0.  She began Prolia on 07/07/2019 (last 07/04/2020).  She has a  history of pulmonary embolismx 2. Initial event occurred in 2011. She had a recurrent pulmonary embolism after holding Coumadin for 1 month for a planned colonoscopy. She is on life long anticoagulation (Coumadin). She has afamily historyof breast cancer (daughter, age 68, and niece). [facility] genetic testingwas negative.  Symptomatically, she feels "good".  Her balance is still off due to left leg weakness and lightheadedness.  Neurologic exam is normal.  Plan: 1.   Labs today: CBC with diff, CMP, CA27.29. 2.Stage T1aNx right breast cancer She is s/p surgery and radiation. She began Femara on 06/09/2019. Clinically, she continues to do  well. Exam reveals no evidence of recurrent disease.   Bilateral mammogram on 08/09/2020 revealed no evidence of recurrent disease. CA 27.29 is 19.1 (normal) today.  Continue letrozole. 3. Osteoporosis Review interval bone density.   Bone density is slightly improved in the forearm radius. She began Prolia on 07/07/2019 (last 07/04/2020).   Continue calcium and vitamin D.  Continue Prolia every 6 months (due 01/04/2021). 4.   RN:  Print out Prolia injections sheet. 5.   RTC on 01/04/2021 for MD assessment, labs (CBC with diff, CMP, CA 27.29) and Prolia.  I discussed the assessment and treatment plan with the patient.  The patient was provided an opportunity to ask questions and all were answered.  The patient agreed with the plan and demonstrated an understanding of the instructions.  The patient was advised to call back if the symptoms worsen or if the condition fails to improve as anticipated.   Lequita Asal, MD, PhD    08/10/2020, 11:25 AM  I, Mirian Mo Tufford, am acting as a Education administrator for [employer]. Mike Gip, MD.   I, Melissa C. Mike Gip, MD, have reviewed the above documentation for accuracy and completeness, and I agree with the above.

## 2020-08-10 ENCOUNTER — Other Ambulatory Visit: Payer: Self-pay

## 2020-08-10 ENCOUNTER — Inpatient Hospital Stay: Payer: Medicare Other | Attending: Hematology and Oncology | Admitting: Hematology and Oncology

## 2020-08-10 ENCOUNTER — Inpatient Hospital Stay: Payer: Medicare Other

## 2020-08-10 ENCOUNTER — Encounter: Payer: Self-pay | Admitting: Hematology and Oncology

## 2020-08-10 VITALS — Ht 62.75 in | Wt 139.7 lb

## 2020-08-10 DIAGNOSIS — Z170 Estrogen receptor positive status [ER+]: Secondary | ICD-10-CM | POA: Diagnosis not present

## 2020-08-10 DIAGNOSIS — M810 Age-related osteoporosis without current pathological fracture: Secondary | ICD-10-CM | POA: Diagnosis not present

## 2020-08-10 DIAGNOSIS — Z7901 Long term (current) use of anticoagulants: Secondary | ICD-10-CM | POA: Insufficient documentation

## 2020-08-10 DIAGNOSIS — M85851 Other specified disorders of bone density and structure, right thigh: Secondary | ICD-10-CM | POA: Diagnosis not present

## 2020-08-10 DIAGNOSIS — Z86711 Personal history of pulmonary embolism: Secondary | ICD-10-CM | POA: Diagnosis not present

## 2020-08-10 DIAGNOSIS — Z85820 Personal history of malignant melanoma of skin: Secondary | ICD-10-CM | POA: Insufficient documentation

## 2020-08-10 DIAGNOSIS — C50211 Malignant neoplasm of upper-inner quadrant of right female breast: Secondary | ICD-10-CM | POA: Diagnosis not present

## 2020-08-10 DIAGNOSIS — Z803 Family history of malignant neoplasm of breast: Secondary | ICD-10-CM | POA: Diagnosis not present

## 2020-08-10 DIAGNOSIS — Z86010 Personal history of colonic polyps: Secondary | ICD-10-CM | POA: Insufficient documentation

## 2020-08-10 DIAGNOSIS — C50911 Malignant neoplasm of unspecified site of right female breast: Secondary | ICD-10-CM | POA: Diagnosis not present

## 2020-08-10 DIAGNOSIS — Z79811 Long term (current) use of aromatase inhibitors: Secondary | ICD-10-CM | POA: Insufficient documentation

## 2020-08-10 DIAGNOSIS — Z90710 Acquired absence of both cervix and uterus: Secondary | ICD-10-CM | POA: Diagnosis not present

## 2020-08-10 DIAGNOSIS — Z923 Personal history of irradiation: Secondary | ICD-10-CM | POA: Insufficient documentation

## 2020-08-10 LAB — COMPREHENSIVE METABOLIC PANEL
ALT: 16 U/L (ref 0–44)
AST: 19 U/L (ref 15–41)
Albumin: 3.9 g/dL (ref 3.5–5.0)
Alkaline Phosphatase: 50 U/L (ref 38–126)
Anion gap: 11 (ref 5–15)
BUN: 20 mg/dL (ref 8–23)
CO2: 28 mmol/L (ref 22–32)
Calcium: 9.6 mg/dL (ref 8.9–10.3)
Chloride: 99 mmol/L (ref 98–111)
Creatinine, Ser: 0.78 mg/dL (ref 0.44–1.00)
GFR calc Af Amer: >60 mL/min (ref >60)
GFR calc non Af Amer: >60 mL/min (ref >60)
Glucose, Bld: 93 mg/dL (ref 70–99)
Potassium: 4 mmol/L (ref 3.5–5.1)
Sodium: 138 mmol/L (ref 135–145)
Total Bilirubin: 0.6 mg/dL (ref 0.3–1.2)
Total Protein: 7 g/dL (ref 6.5–8.1)

## 2020-08-10 LAB — CBC WITH DIFFERENTIAL/PLATELET
Abs Immature Granulocytes: 0.01 10*3/uL (ref 0.00–0.07)
Basophils Absolute: 0 10*3/uL (ref 0.0–0.1)
Basophils Relative: 0 %
Eosinophils Absolute: 0.2 10*3/uL (ref 0.0–0.5)
Eosinophils Relative: 3 %
HCT: 37.9 % (ref 36.0–46.0)
Hemoglobin: 12.9 g/dL (ref 12.0–15.0)
Immature Granulocytes: 0 %
Lymphocytes Relative: 22 %
Lymphs Abs: 1.1 10*3/uL (ref 0.7–4.0)
MCH: 32 pg (ref 26.0–34.0)
MCHC: 34 g/dL (ref 30.0–36.0)
MCV: 94 fL (ref 80.0–100.0)
Monocytes Absolute: 0.5 10*3/uL (ref 0.1–1.0)
Monocytes Relative: 9 %
Neutro Abs: 3.3 10*3/uL (ref 1.7–7.7)
Neutrophils Relative %: 66 %
Platelets: 200 10*3/uL (ref 150–400)
RBC: 4.03 MIL/uL (ref 3.87–5.11)
RDW: 13 % (ref 11.5–15.5)
WBC: 5 10*3/uL (ref 4.0–10.5)
nRBC: 0 % (ref 0.0–0.2)

## 2020-08-10 NOTE — Progress Notes (Signed)
No new changes noted today 

## 2020-08-11 LAB — CANCER ANTIGEN 27.29: CA 27.29: 19.1 U/mL (ref 0.0–38.6)

## 2020-08-17 ENCOUNTER — Other Ambulatory Visit: Payer: Self-pay | Admitting: Internal Medicine

## 2020-08-17 DIAGNOSIS — Z7901 Long term (current) use of anticoagulants: Secondary | ICD-10-CM

## 2020-08-17 NOTE — Telephone Encounter (Signed)
Pt is compliant with coumadin management. Sent in refill.

## 2020-08-22 DIAGNOSIS — Z86711 Personal history of pulmonary embolism: Secondary | ICD-10-CM | POA: Diagnosis not present

## 2020-08-22 DIAGNOSIS — Z86718 Personal history of other venous thrombosis and embolism: Secondary | ICD-10-CM | POA: Diagnosis not present

## 2020-08-22 DIAGNOSIS — D802 Selective deficiency of immunoglobulin A [IgA]: Secondary | ICD-10-CM | POA: Diagnosis not present

## 2020-08-22 LAB — POCT INR: INR: 2.5 (ref 2.0–3.0)

## 2020-08-24 ENCOUNTER — Other Ambulatory Visit: Payer: Self-pay

## 2020-08-24 ENCOUNTER — Ambulatory Visit (INDEPENDENT_AMBULATORY_CARE_PROVIDER_SITE_OTHER): Payer: Medicare Other

## 2020-08-24 DIAGNOSIS — Z23 Encounter for immunization: Secondary | ICD-10-CM

## 2020-08-25 ENCOUNTER — Ambulatory Visit (INDEPENDENT_AMBULATORY_CARE_PROVIDER_SITE_OTHER): Payer: Medicare Other

## 2020-08-25 DIAGNOSIS — Z7901 Long term (current) use of anticoagulants: Secondary | ICD-10-CM | POA: Diagnosis not present

## 2020-08-25 NOTE — Patient Instructions (Addendum)
Pre visit review using our clinic review tool, if applicable. No additional management support is needed unless otherwise documented below in the visit note.  Continue taking 7.5mg daily except take 0mg on Wednesday. Recheck in 4 wks.Advised pt of result. Pt verbalized understanding. Pt denies AVS being mailed.  

## 2020-08-26 DIAGNOSIS — R42 Dizziness and giddiness: Secondary | ICD-10-CM | POA: Diagnosis not present

## 2020-08-26 DIAGNOSIS — H90A32 Mixed conductive and sensorineural hearing loss, unilateral, left ear with restricted hearing on the contralateral side: Secondary | ICD-10-CM | POA: Diagnosis not present

## 2020-09-19 DIAGNOSIS — D802 Selective deficiency of immunoglobulin A [IgA]: Secondary | ICD-10-CM | POA: Diagnosis not present

## 2020-09-19 LAB — POCT INR: INR: 3.1 — AB (ref 2.0–3.0)

## 2020-09-19 LAB — PROTIME-INR

## 2020-09-21 ENCOUNTER — Ambulatory Visit (INDEPENDENT_AMBULATORY_CARE_PROVIDER_SITE_OTHER): Payer: Medicare Other

## 2020-09-21 ENCOUNTER — Telehealth: Payer: Self-pay

## 2020-09-21 DIAGNOSIS — Z7901 Long term (current) use of anticoagulants: Secondary | ICD-10-CM | POA: Diagnosis not present

## 2020-09-21 NOTE — Telephone Encounter (Signed)
Pt was to have INR tested at [facility] on 10/18 and no results have been received.  Contacted Janci, RN, and she said she would fax them when she got back to the office.

## 2020-09-21 NOTE — Patient Instructions (Addendum)
Pre visit review using our clinic review tool, if applicable. No additional management support is needed unless otherwise documented below in the visit note.  Advised to reduce dose tomorrow to 5mg  then continue taking 7.5mg  daily except take 0mg  on Wednesday. Recheck in 4 wks.

## 2020-09-28 DIAGNOSIS — Z23 Encounter for immunization: Secondary | ICD-10-CM | POA: Diagnosis not present

## 2020-10-04 NOTE — Telephone Encounter (Signed)
Do not see result note on 04/26/20 that pt got result note.

## 2020-10-14 DIAGNOSIS — C50411 Malignant neoplasm of upper-outer quadrant of right female breast: Secondary | ICD-10-CM | POA: Diagnosis not present

## 2020-10-14 DIAGNOSIS — Z170 Estrogen receptor positive status [ER+]: Secondary | ICD-10-CM | POA: Diagnosis not present

## 2020-10-17 DIAGNOSIS — D802 Selective deficiency of immunoglobulin A [IgA]: Secondary | ICD-10-CM | POA: Diagnosis not present

## 2020-10-17 LAB — POCT INR: INR: 2.4 (ref 2.0–3.0)

## 2020-10-18 ENCOUNTER — Ambulatory Visit (INDEPENDENT_AMBULATORY_CARE_PROVIDER_SITE_OTHER): Payer: Medicare Other

## 2020-10-18 DIAGNOSIS — Z7901 Long term (current) use of anticoagulants: Secondary | ICD-10-CM | POA: Diagnosis not present

## 2020-10-18 DIAGNOSIS — Z23 Encounter for immunization: Secondary | ICD-10-CM | POA: Diagnosis not present

## 2020-10-18 NOTE — Patient Instructions (Signed)
Pre visit review using our clinic review tool, if applicable. No additional management support is needed unless otherwise documented below in the visit note.  Continue taking 7.5mg  daily except take 0mg  on Wednesday. Recheck in 4 wks.Advised pt of result. Pt verbalized understanding. Pt denies AVS being mailed.

## 2020-10-24 ENCOUNTER — Other Ambulatory Visit: Payer: Self-pay

## 2020-10-24 ENCOUNTER — Encounter: Payer: Self-pay | Admitting: Podiatry

## 2020-10-24 ENCOUNTER — Ambulatory Visit (INDEPENDENT_AMBULATORY_CARE_PROVIDER_SITE_OTHER): Payer: Medicare Other | Admitting: Podiatry

## 2020-10-24 ENCOUNTER — Telehealth: Payer: Self-pay | Admitting: *Deleted

## 2020-10-24 DIAGNOSIS — B351 Tinea unguium: Secondary | ICD-10-CM | POA: Diagnosis not present

## 2020-10-24 DIAGNOSIS — M79609 Pain in unspecified limb: Secondary | ICD-10-CM

## 2020-10-24 DIAGNOSIS — D689 Coagulation defect, unspecified: Secondary | ICD-10-CM

## 2020-10-24 NOTE — Progress Notes (Signed)
This patient returns to my office for at risk foot care.  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  This patient is unable to cut nails herself since the patient cannot reach her nails.These nails are painful walking and wearing shoes.  This patient presents for at risk foot care today.  General Appearance  Alert, conversant and in no acute stress.  Vascular  Dorsalis pedis and posterior tibial  pulses are palpable  bilaterally.  Capillary return is within normal limits  bilaterally. Temperature is within normal limits  bilaterally.  Neurologic  Senn-Weinstein monofilament wire test within normal limits  bilaterally. Muscle power within normal limits bilaterally.  Nails Thick disfigured discolored nails with subungual debris  from hallux to fifth toes bilaterally. No evidence of bacterial infection or drainage bilaterally.  Orthopedic  No limitations of motion  feet .  No crepitus or effusions noted.  No bony pathology or digital deformities noted.  Skin  normotropic skin with no porokeratosis noted bilaterally.  No signs of infections or ulcers noted.     Onychomycosis  Pain in right toes  Pain in left toes  Consent was obtained for treatment procedures.   Mechanical debridement of nails 1-5  bilaterally performed with a nail nipper.  Filed with dremel without incident.    Return office visit    3 months                  Told patient to return for periodic foot care and evaluation due to potential at risk complications.   Gardiner Barefoot DPM

## 2020-10-24 NOTE — Telephone Encounter (Signed)
Patient called stating that she has been using Miralax for years for constipation. Patient stated that the miralax has been working find until Thursday. Patient stated that she was constipated and finally had a good BM. Patient stated that she is concerned that miralax has all of a sudden quit working for her. Patient wants to know if she needs to have any test done to make sure that her bowels are okay.'Patient denies any abdominal pain.

## 2020-10-25 NOTE — Telephone Encounter (Signed)
Not uncommon with age to have more trouble with bowels. Could add Senna 1 tab at bedtime.

## 2020-10-26 NOTE — Telephone Encounter (Signed)
Left message on voicemail.

## 2020-11-14 ENCOUNTER — Ambulatory Visit: Payer: Self-pay

## 2020-11-14 DIAGNOSIS — D802 Selective deficiency of immunoglobulin A [IgA]: Secondary | ICD-10-CM | POA: Diagnosis not present

## 2020-11-14 DIAGNOSIS — Z7901 Long term (current) use of anticoagulants: Secondary | ICD-10-CM

## 2020-11-14 LAB — POCT INR: INR: 3.2 — AB (ref 2.0–3.0)

## 2020-11-14 NOTE — Patient Instructions (Signed)
INR today 3.2  I spoke with patient and patient is eating cranberry through the holiday season. We will hold her dose today and then resume prior dosing schedule and recheck in 2 weeks on 12/27.  I have informed Randall An, RN who will forward recheck info to Kopperl (nurse at [facility]) via email.   Patient is aware of risks r/t supratherapeutic level and will go to ER if any concerns.  At present, no signs of abnormal bruising or bleeding.

## 2020-11-21 ENCOUNTER — Telehealth: Payer: Self-pay

## 2020-11-21 IMAGING — MG DIGITAL DIAGNOSTIC BILAT W/ TOMO W/ CAD
6 of 11 series · 6 of 31 positions shown · non-contrast
Comparison: Previous exam(s).

CLINICAL DATA: Patient with history of right lumpectomy 6404.

EXAM:
DIGITAL DIAGNOSTIC BILATERAL MAMMOGRAM WITH TOMO AND CAD

[R MLO]
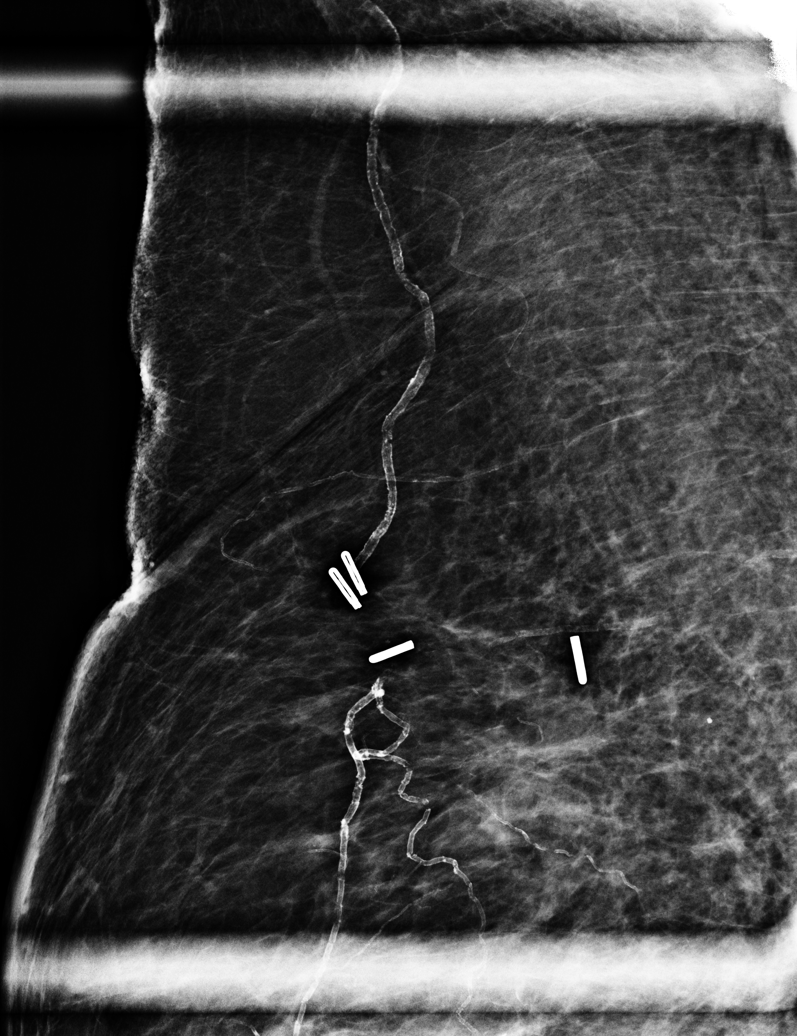

[R MLO synth-2D]
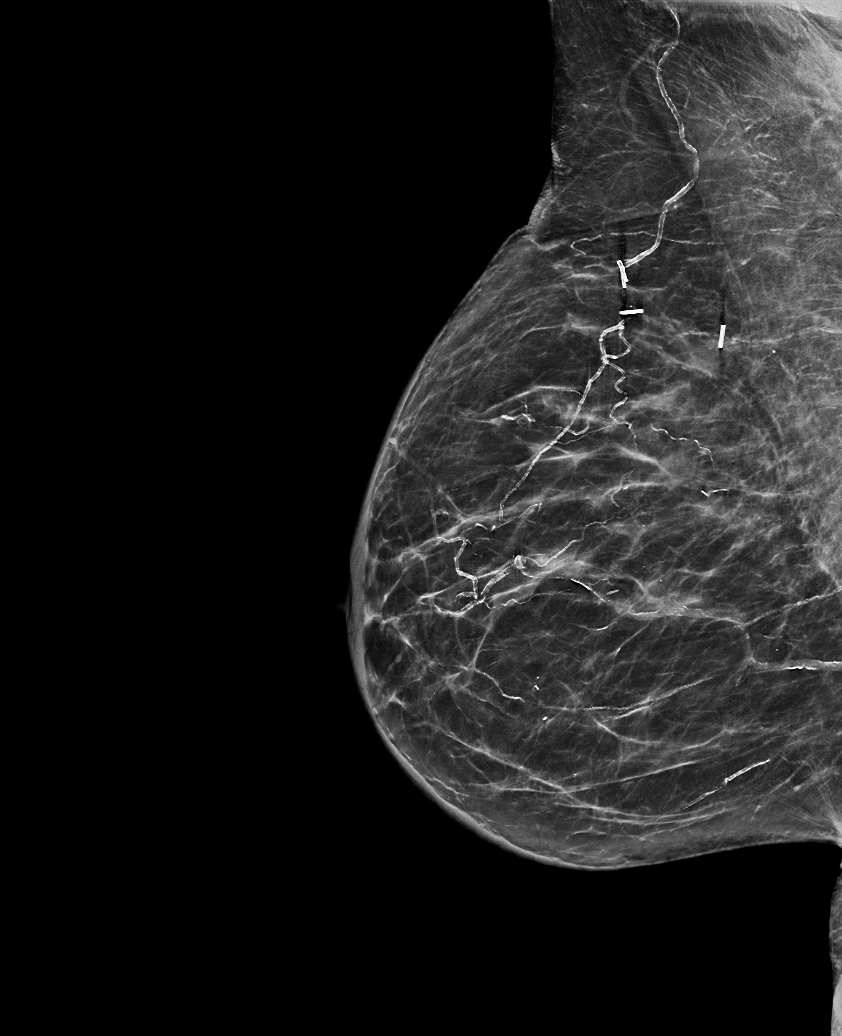

[L CC synth-2D]
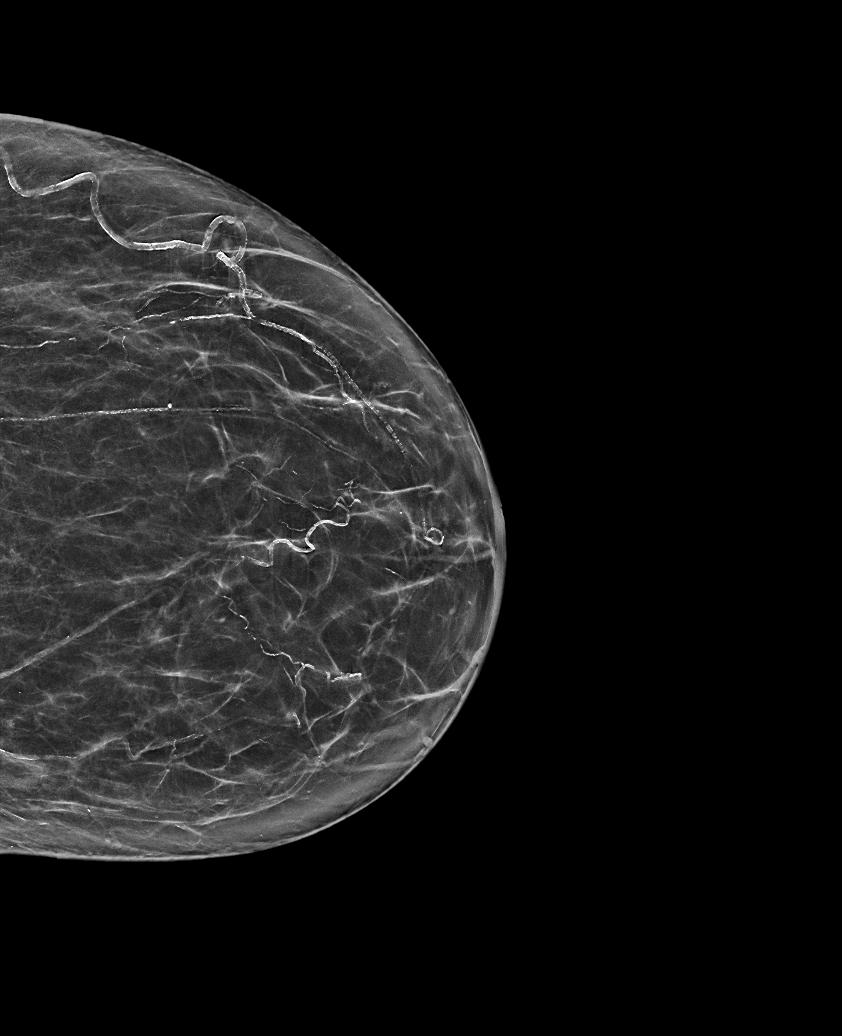

[R CC synth-2D]
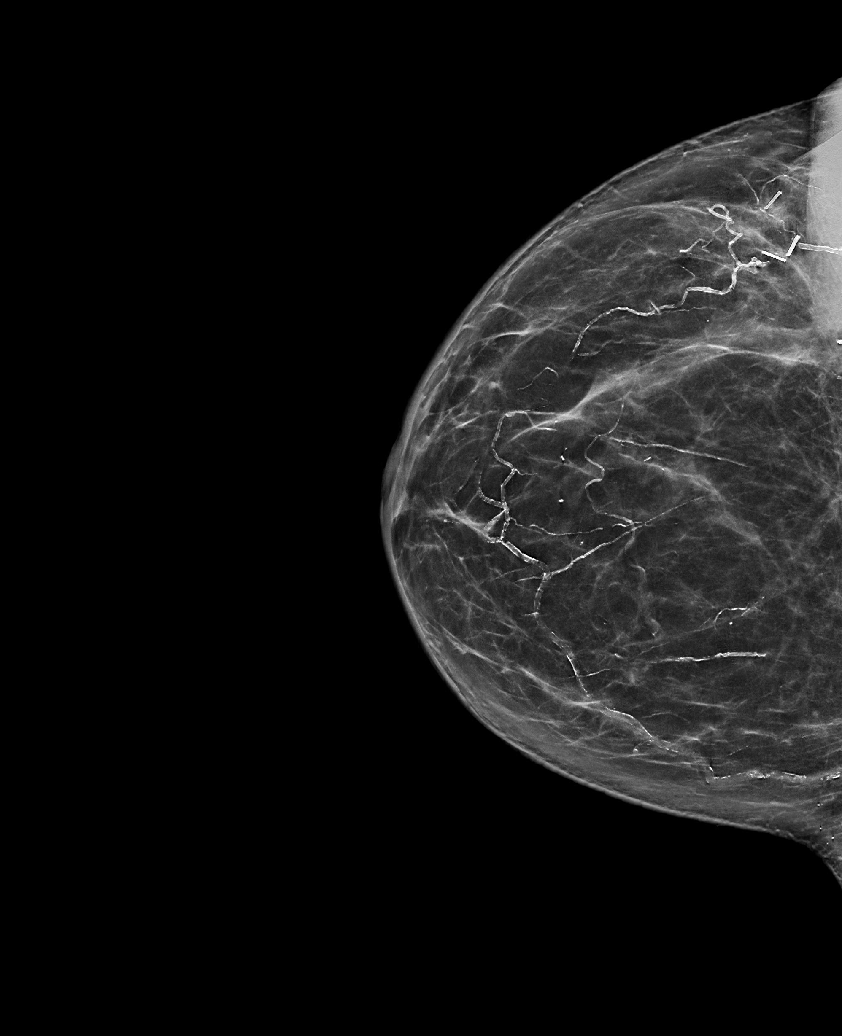

[L MLO synth-2D]
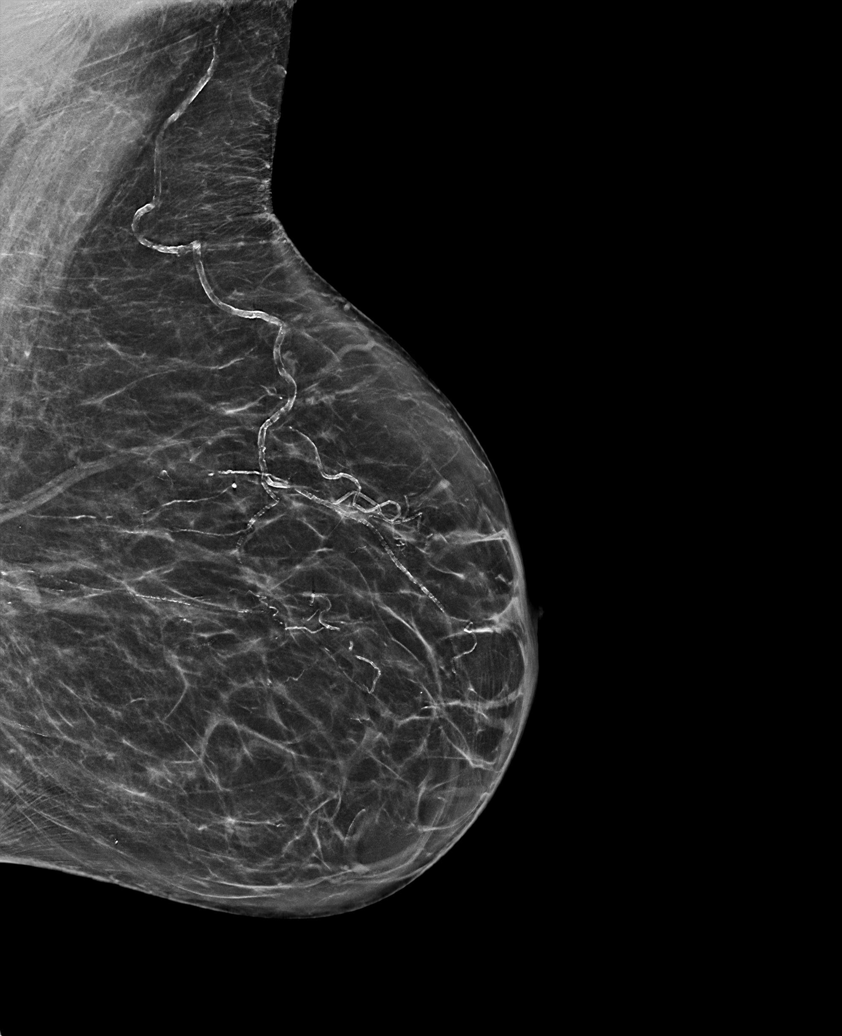

[R XCCL synth-2D]
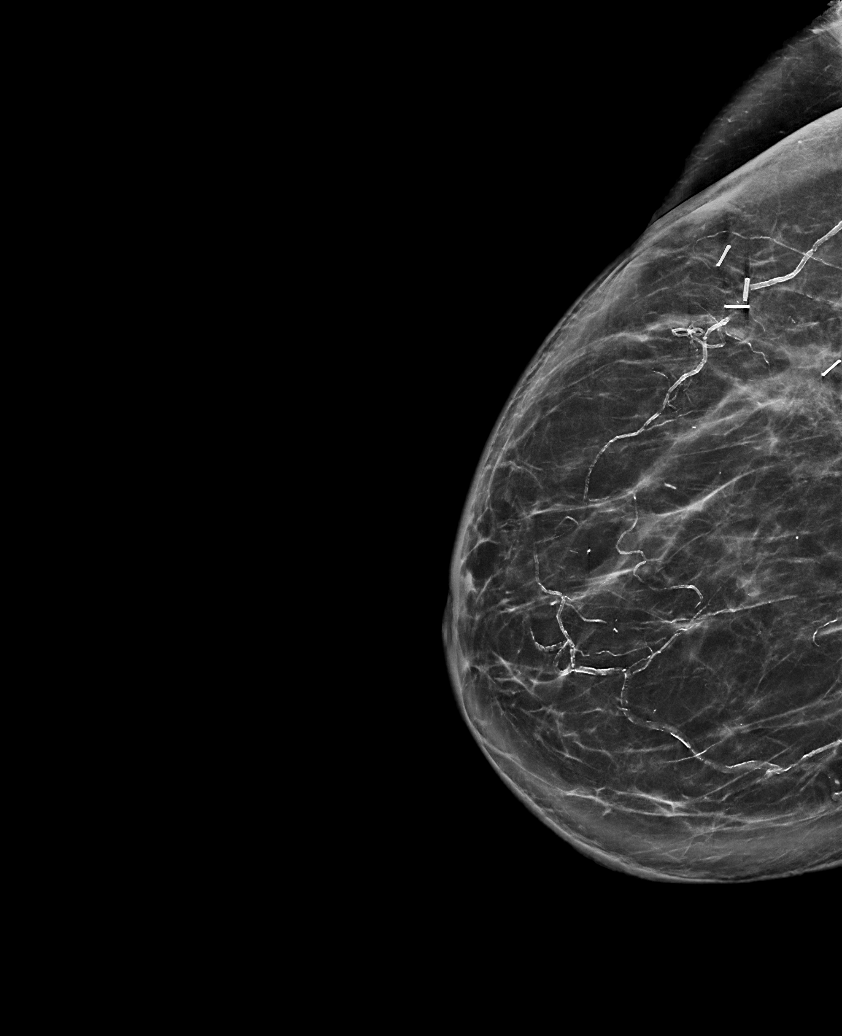

[6 of 31 positions shown; findings below may reference images not displayed]

ACR Breast Density Category b: There are scattered areas of
fibroglandular density.
FINDINGS: Stable postlumpectomy changes right breast. No new masses,
calcifications or nonsurgical distortion identified within either
breast.

Mammographic images were processed with CAD.
IMPRESSION: No mammographic evidence for malignancy.

RECOMMENDATION:
Bilateral diagnostic mammography 1 year.

I have discussed the findings and recommendations with the patient.
If applicable, a reminder letter will be sent to the patient
regarding the next appointment.

BI-RADS CATEGORY  2: Benign.

## 2020-11-21 NOTE — Telephone Encounter (Signed)
Pt called to report she has moved her INR lab from 12/27 to 12/30.

## 2020-12-01 ENCOUNTER — Ambulatory Visit (INDEPENDENT_AMBULATORY_CARE_PROVIDER_SITE_OTHER): Payer: Medicare Other

## 2020-12-01 DIAGNOSIS — Z7901 Long term (current) use of anticoagulants: Secondary | ICD-10-CM

## 2020-12-01 LAB — POCT INR: INR: 2.9 (ref 2.0–3.0)

## 2020-12-01 LAB — PROTIME-INR: INR: 2.9 — AB (ref 0.9–1.1)

## 2020-12-01 NOTE — Patient Instructions (Signed)
Pre visit review using our clinic review tool, if applicable. No additional management support is needed unless otherwise documented below in the visit note. 

## 2020-12-15 DIAGNOSIS — H7202 Central perforation of tympanic membrane, left ear: Secondary | ICD-10-CM | POA: Diagnosis not present

## 2020-12-15 DIAGNOSIS — H6123 Impacted cerumen, bilateral: Secondary | ICD-10-CM | POA: Diagnosis not present

## 2020-12-26 DIAGNOSIS — D802 Selective deficiency of immunoglobulin A [IgA]: Secondary | ICD-10-CM | POA: Diagnosis not present

## 2020-12-26 LAB — POCT INR: INR: 4.8 — AB (ref 2.0–3.0)

## 2020-12-26 LAB — PROTIME-INR: INR: 4.8 — AB (ref 0.9–1.1)

## 2020-12-27 ENCOUNTER — Ambulatory Visit (INDEPENDENT_AMBULATORY_CARE_PROVIDER_SITE_OTHER): Payer: Medicare Other

## 2020-12-27 DIAGNOSIS — Z7901 Long term (current) use of anticoagulants: Secondary | ICD-10-CM

## 2020-12-27 NOTE — Patient Instructions (Addendum)
Pre visit review using our clinic review tool, if applicable. No additional management support is needed unless otherwise documented below in the visit note.  Contacted pt by phone and advised to hold dose today then change weekly dose to take 7.5mg  on Mon, Thurs, and Sat, and take 5mg  on Sun, Tues, and Fri, and take 0mg  on Wed. Recheck in 2 wks.

## 2021-01-03 NOTE — Progress Notes (Signed)
[facility]  [address], [address] [place], [place] [address] Phone: [phone]  Fax: [phone]   Clinic Day:  01/04/2021  Referring physician: Lorre Munroe, NP  Chief Complaint: Becky Farrell is a 87 y.o. female with stage I right breast cancer and osteoporosis who is seen for 5 month assessment and continuation of Prolia.   HPI: The patient was last seen in the medical oncology clinic on 08/10/2020. At that time, she felt "good". Her balance was still off due to left leg weakness and lightheadedness. Neurologic exam was normal. Hematocrit was 37.9, hemoglobin 12.9, platelets 200,000, WBC 5,000. CMP was normal. CA27.29 was 19.1. She continued Femara, calcium, and vitamin D.  During the interim, she has been "good." She feels like her energy decreases throughout the day; she tries to pace herself. She takes power naps in the afternoon. She has tingling in her feet and dizziness which affect her balance. She also has numbness in her hands. At night, her feet cramp and are numb.  The patient has recently started to become short of breath with minimal exertion. Her blood pressure has been running high. She has been under some stress due to deaths in the family. Her speaking voice is changing and she is not able to project.   Her INR was 4.8 on 12/26/2020.    Past Medical History:  Diagnosis Date  . Colon polyps   . Family history of breast cancer   . History of pulmonary embolism 2011  . Hx of melanoma of skin 2003   Right leg  . Hypertension   . Melanoma (HCC) 2003  . Personal history of radiation therapy   . Seizures (HCC) 1993   no meds since (approx) 2014  . Vertigo    x1. R/T perforated ear drum prior to 2013    Past Surgical History:  Procedure Laterality Date  . ABDOMINAL HYSTERECTOMY  05/2014  . BREAST BIOPSY Right 08/20/2018   invasive mammary carcinoma/DCIS   . BREAST LUMPECTOMY Right 10/10/2018  . BREAST LUMPECTOMY WITH RADIOACTIVE  SEED LOCALIZATION Right 10/10/2018   Procedure: BREAST LUMPECTOMY WITH RADIOACTIVE SEED LOCALIZATION;  Surgeon: Griselda Miner, MD;  Location: [facility] OR;  Service: General;  Laterality: Right;  . CATARACT EXTRACTION W/PHACO Right 01/06/2018   Procedure: CATARACT EXTRACTION PHACO AND INTRAOCULAR LENS PLACEMENT (IOC) RIGHT;  Surgeon: Lockie Mola, MD;  Location: [facility];  Service: Ophthalmology;  Laterality: Right;  . CATARACT EXTRACTION W/PHACO Left 01/29/2018   Procedure: CATARACT EXTRACTION PHACO AND INTRAOCULAR LENS PLACEMENT (IOC) LEFT;  Surgeon: Lockie Mola, MD;  Location: [facility];  Service: Ophthalmology;  Laterality: Left;  . left hand surgery  05/2011   s/p fall  . OTHER SURGICAL HISTORY  2003   Removal of lymphoma from melonoma 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     Family History  Problem Relation Age of Onset  . Heart disease Mother   . Aortic aneurysm Father   . Breast cancer Daughter 45  . Breast cancer Niece 76    Social History:  reports that she has never smoked. She has never used smokeless tobacco. She reports that she does not drink alcohol and does not use drugs. The patient recently moved from [place] to [place]. Her daughter and grandchildren live in [place]. She has other children in [place]. Her husband has been recovering from prostate cancer. She is a retired Runner, broadcasting/film/video. 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   Allergies:  Allergies  Allergen Reactions  .  Clobetasol Other (See Comments)    vertigo  . Clobetasol Propionate Other (See Comments)    This is ointment form UNSPECIFIED REACTION   . Phenergan [Promethazine Hcl] Other (See Comments)    Rapid heart rate  . Dextromethorphan Palpitations and Other (See Comments)    promethazine with DM, rapid hearbeat  . Zithromax [Azithromycin] Palpitations and Other (See Comments)    Per OSH report, pt unable to recall Rapid heart rate    Current Medications: Current Outpatient Medications   Medication Sig Dispense Refill  . aspirin 81 MG tablet Take 81 mg by mouth daily.    Marland Kitchen atenolol (TENORMIN) 25 MG tablet TAKE 1 TABLET BY MOUTH EVERY DAY 90 tablet 2  . Cholecalciferol (VITAMIN D3) 2000 UNITS TABS Take 2,000 Units by mouth daily.     Marland Kitchen denosumab (PROLIA) 60 MG/ML SOSY injection Inject 60 mg into the skin every 6 (six) months.    . hydrochlorothiazide (MICROZIDE) 12.5 MG capsule TAKE 1 CAPSULE BY MOUTH EVERY DAY 90 capsule 2  . Iodoquinol-HC-Aloe Polysacch (ALCORTIN A) 1-2-1 % GEL Apply topically as needed (for rash).    Marland Kitchen letrozole (FEMARA) 2.5 MG tablet TAKE 1 TABLET BY MOUTH EVERY DAY 90 tablet 3  . lisinopril (ZESTRIL) 10 MG tablet Take 1 tablet (10 mg total) by mouth daily. 90 tablet 2  . polyethylene glycol (MIRALAX / GLYCOLAX) packet Take 17 g by mouth every other day.    . warfarin (COUMADIN) 7.5 MG tablet Take one tablet, 7.$RemoveBefor'5mg'vNAXciyEnTxK$ ,  by mouth daily except take no tablet on Wednesdays or as directed by coumadin clinic. 70 tablet 1  . Wheat Dextrin (BENEFIBER PO) Take 2 each by mouth See admin instructions. Mix 2 teaspoons and drink 3 times daily     No current facility-administered medications for this visit.    Review of Systems  Constitutional: Positive for malaise/fatigue (has the most energy in the morning, takes power naps). Negative for chills, diaphoresis, fever and weight loss (stable).  HENT: Positive for hearing loss. Negative for congestion, ear discharge, ear pain, nosebleeds, sinus pain, sore throat and tinnitus.        Change in speaking voice.  Eyes: Negative.  Negative for blurred vision, double vision and photophobia.  Respiratory: Positive for shortness of breath (minmal exertion). Negative for cough, hemoptysis and sputum production.        Recurrent PE x 2.  Cardiovascular: Negative.  Negative for chest pain, palpitations, orthopnea, leg swelling and PND.  Gastrointestinal: Negative.  Negative for abdominal pain, blood in stool, constipation, diarrhea,  heartburn, melena, nausea and vomiting.       Poor appetite.  Genitourinary: Negative.  Negative for dysuria, frequency, hematuria and urgency.  Musculoskeletal: Positive for myalgias (foot cramping at night). Negative for back pain, falls, joint pain and neck pain.       Osteoporosis.  Skin: Negative.  Negative for itching and rash.  Neurological: Positive for dizziness and sensory change (tingling in feet, numbness in hands, numbness in feet at night). Negative for tingling, tremors, speech change, focal weakness, seizures (history), weakness and headaches.       Chronic balance issues.  Endo/Heme/Allergies: Does not bruise/bleed easily.  Psychiatric/Behavioral: Negative.  Negative for depression and memory loss. The patient is not nervous/anxious and does not have insomnia.   All other systems reviewed and are negative.  Performance status (ECOG): 1  Vitals Blood pressure (!) 175/88, pulse (!) 40, temperature (!) 97.4 F (36.3 C), temperature source Tympanic, resp. rate 16, weight  139 lb 1.8 oz (63.1 kg), SpO2 99 %.   Physical Exam Vitals and nursing note reviewed.  Constitutional:      General: She is not in acute distress.    Appearance: She is well-developed. She is not diaphoretic.  HENT:     Head: Normocephalic and atraumatic.     Mouth/Throat:     Mouth: Mucous membranes are moist.     Pharynx: Oropharynx is clear. No oropharyngeal exudate.  Eyes:     General: No scleral icterus.    Extraocular Movements: Extraocular movements intact.     Conjunctiva/sclera: Conjunctivae normal.     Pupils: Pupils are equal, round, and reactive to light.     Comments: Glasses. Blue eyes.  Neck:     Vascular: No JVD.  Cardiovascular:     Rate and Rhythm: Normal rate. Rhythm irregular.     Heart sounds: Normal heart sounds. No murmur heard.   Pulmonary:     Effort: Pulmonary effort is normal. No respiratory distress.     Breath sounds: Normal breath sounds. No wheezing or rales.   Chest:     Chest wall: No tenderness.  Breasts:     Right: No swelling, bleeding, mass, skin change, tenderness, axillary adenopathy or supraclavicular adenopathy.     Left: Skin change (fibrocystic changes in upper inner quadrant and medially) present. No swelling, bleeding, mass, tenderness, axillary adenopathy or supraclavicular adenopathy.    Abdominal:     General: Bowel sounds are normal. There is no distension.     Palpations: Abdomen is soft. There is no mass.     Tenderness: There is no abdominal tenderness. There is no guarding or rebound.  Musculoskeletal:        General: No swelling or tenderness. Normal range of motion.     Cervical back: Normal range of motion and neck supple.  Lymphadenopathy:     Head:     Right side of head: No preauricular, posterior auricular or occipital adenopathy.     Left side of head: No preauricular, posterior auricular or occipital adenopathy.     Cervical: No cervical adenopathy.     Upper Body:     Right upper body: No supraclavicular or axillary adenopathy.     Left upper body: No supraclavicular or axillary adenopathy.     Lower Body: No right inguinal adenopathy. No left inguinal adenopathy.  Skin:    General: Skin is warm and dry.  Neurological:     Mental Status: She is alert and oriented to person, place, and time.  Psychiatric:        Behavior: Behavior normal.        Thought Content: Thought content normal.        Judgment: Judgment normal.    Appointment on 01/04/2021  Component Date Value Ref Range Status  . WBC 01/04/2021 5.2  4.0 - 10.5 K/uL Final  . RBC 01/04/2021 4.12  3.87 - 5.11 MIL/uL Final  . Hemoglobin 01/04/2021 13.0  12.0 - 15.0 g/dL Final  . HCT 01/04/2021 39.4  36.0 - 46.0 % Final  . MCV 01/04/2021 95.6  80.0 - 100.0 fL Final  . MCH 01/04/2021 31.6  26.0 - 34.0 pg Final  . MCHC 01/04/2021 33.0  30.0 - 36.0 g/dL Final  . RDW 01/04/2021 13.9  11.5 - 15.5 % Final  . Platelets 01/04/2021 184  150 - 400 K/uL  Final  . nRBC 01/04/2021 0.0  0.0 - 0.2 % Final  . Neutrophils Relative % 01/04/2021  64  % Final  . Neutro Abs 01/04/2021 3.4  1.7 - 7.7 K/uL Final  . Lymphocytes Relative 01/04/2021 21  % Final  . Lymphs Abs 01/04/2021 1.1  0.7 - 4.0 K/uL Final  . Monocytes Relative 01/04/2021 11  % Final  . Monocytes Absolute 01/04/2021 0.6  0.1 - 1.0 K/uL Final  . Eosinophils Relative 01/04/2021 2  % Final  . Eosinophils Absolute 01/04/2021 0.1  0.0 - 0.5 K/uL Final  . Basophils Relative 01/04/2021 1  % Final  . Basophils Absolute 01/04/2021 0.0  0.0 - 0.1 K/uL Final  . Immature Granulocytes 01/04/2021 1  % Final  . Abs Immature Granulocytes 01/04/2021 0.03  0.00 - 0.07 K/uL Final   Performed at [facility], [address], [place], [place] [address]    Assessment:  ALLYANA VOGAN is a 87 y.o. female with stage T1aNx right breast cancers/p lumpectomy on 10/10/2018. Pathology revealed a 0.4 cm grade I invasive ductal carcinoma with mucinous features. There was intermediate grade DCIS. Margins were uninvolved (1.0 cm posterior margin). There were fibrocystic changes including apocrine metaplasia. Tumor was ER + (100%), PR + (90%), and Her2/neu-. Ki67 was 10%. Pathologic stagewas T1aNx. CA27.29was 14.1 on 09/08/2018.  Bilateral screening mammogramon 07/31/2018 revealed a possible mass in the right breast. Right diagnostic mammogramand targeted ultrasound on 08/13/2018 revealed a 4 x 5 x 8 mm indeterminate mass at the 10 o'clock position 9 cm from the nipple in the right breast.  Bilateral diagnostic mammogram on 08/04/2019 revealed no evidence of malignancy in either breast. There were lumpectomy changes on the right.  Bilateral diagnostic mammogram on 08/09/2020 revealed no mammographic evidence of malignancy.  Shereceivedradiationfrom 12/16/2018- 02/03/2019.She received5040cGyin 28 fractions with a scar boost of another 1400 cGyusing electron beam.  She began Femara on  06/09/2019.  CA27.29 has been followed: 14.1 on 09/08/2018, 14.4 on 04/09/2019, 18.1 on 07/07/2019, 19.3 on 10/06/2019, 21.0 on 01/05/2020, 22.4 in 04/05/2020, and 19.1 on 08/10/2020.  Bone densityon 07/31/2018 revealed osteoporosiswith a T-score of -3.4 in the right forearm radius and -1.9 in the right femoral neck.  Bone density on 08/09/2020 revealed osteoporosis with a T-score of -3.2 in the forearm radius.  Right femoral neck revealed osteopenia with a T-score of -2.0.  She began Prolia on 07/07/2019 (last 07/04/2020).  She has a history of pulmonary embolismx 2. Initial event occurred in 2011. She had a recurrent pulmonary embolism after holding Coumadin for 1 month for a planned colonoscopy. She is on life long anticoagulation (Coumadin). She has afamily historyof breast cancer (daughter, age 65, and niece). [facility] genetic testingwas negative.  She received the high dose flu shot on 08/24/2020.  Symptomatically, she feels like her energy decreases throughout the day; she tries to pace herself. She takes power naps in the afternoon. She has tingling in her feet and dizziness which affect her balance. She has recently become short of breath with minimal exertion. Her blood pressure has been running high.  Pulse is irregular.  Plan: 1.   Labs today: CBC with diff, CMP, CA 27.29. 2.Stage T1aNx right breast cancer She is s/p surgery and radiation. She began Femara on 06/09/2019. Clinically, she appears to be doing well regarding her breast cancer Exam there is no evidence of recurrent disease.   Bilateral mammogram on 08/09/2020 reveals no evidence of recurrent disease. CA 27.29 is 17.4 today.  Continue letrozole. 3. Osteoporosis Bone density on 08/09/2020 revealed T score -3.2 in the forearm radius. She began Prolia on  07/07/2019 (last 07/04/2020).   Continue calcium and vitamin D.  Continue Prolia every 6 months (due 01/04/2021). 4.   Irregular rhythm and shortness  of breath  Patient has new symptoms shortness of breath with minimal exertion.  Discuss plan for EKG.  Contact Dr. Golden Hurter reguarding patient's symptoms 5.   Postpone Prolia today secondary to above symptoms. 6.   Bilateral mammogram on 08/10/2021. 7.   RTC in 1 week for MD assessment, labs (CMP) and Prolia.  I discussed the assessment and treatment plan with the patient. The patient was provided an opportunity to ask questions and all were answered. The patient agreed with the plan and demonstrated an understanding of the instructions. The patient was advised to call back if the symptoms worsen or if the condition fails to improve as anticipated.  I provided 27 minutes of face-to-face time during this this encounter and > 50% was spent counseling as documented under my assessment and plan.  An additional 10 minutes were spent reviewing his chart (Epic and Care Everywhere) including notes, labs, and imaging studies.    Lequita Asal, MD, PhD  01/04/2021, 11:25 AM  I, Mirian Mo Tufford, am acting as a Education administrator for [employer]. Mike Gip, MD.   I, Sreshta Cressler C. Mike Gip, MD, have reviewed the above documentation for accuracy and completeness, and I agree with the above.

## 2021-01-04 ENCOUNTER — Encounter: Payer: Self-pay | Admitting: Hematology and Oncology

## 2021-01-04 ENCOUNTER — Inpatient Hospital Stay: Payer: Medicare Other | Attending: Hematology and Oncology | Admitting: Hematology and Oncology

## 2021-01-04 ENCOUNTER — Inpatient Hospital Stay: Payer: Medicare Other

## 2021-01-04 ENCOUNTER — Other Ambulatory Visit: Payer: Self-pay

## 2021-01-04 ENCOUNTER — Telehealth: Payer: Self-pay

## 2021-01-04 ENCOUNTER — Telehealth: Payer: Self-pay | Admitting: Internal Medicine

## 2021-01-04 VITALS — BP 175/88 | HR 40 | Temp 97.4°F | Resp 16 | Wt 139.1 lb

## 2021-01-04 DIAGNOSIS — I499 Cardiac arrhythmia, unspecified: Secondary | ICD-10-CM | POA: Diagnosis not present

## 2021-01-04 DIAGNOSIS — C50911 Malignant neoplasm of unspecified site of right female breast: Secondary | ICD-10-CM | POA: Diagnosis not present

## 2021-01-04 DIAGNOSIS — Z803 Family history of malignant neoplasm of breast: Secondary | ICD-10-CM | POA: Diagnosis not present

## 2021-01-04 DIAGNOSIS — Z170 Estrogen receptor positive status [ER+]: Secondary | ICD-10-CM | POA: Diagnosis not present

## 2021-01-04 DIAGNOSIS — C50211 Malignant neoplasm of upper-inner quadrant of right female breast: Secondary | ICD-10-CM

## 2021-01-04 DIAGNOSIS — Z85820 Personal history of malignant melanoma of skin: Secondary | ICD-10-CM | POA: Diagnosis not present

## 2021-01-04 DIAGNOSIS — Z79811 Long term (current) use of aromatase inhibitors: Secondary | ICD-10-CM | POA: Insufficient documentation

## 2021-01-04 DIAGNOSIS — Z7901 Long term (current) use of anticoagulants: Secondary | ICD-10-CM | POA: Diagnosis not present

## 2021-01-04 DIAGNOSIS — Z923 Personal history of irradiation: Secondary | ICD-10-CM | POA: Insufficient documentation

## 2021-01-04 DIAGNOSIS — M85851 Other specified disorders of bone density and structure, right thigh: Secondary | ICD-10-CM | POA: Diagnosis not present

## 2021-01-04 DIAGNOSIS — Z90710 Acquired absence of both cervix and uterus: Secondary | ICD-10-CM | POA: Insufficient documentation

## 2021-01-04 DIAGNOSIS — Z86010 Personal history of colonic polyps: Secondary | ICD-10-CM | POA: Diagnosis not present

## 2021-01-04 DIAGNOSIS — Z86711 Personal history of pulmonary embolism: Secondary | ICD-10-CM | POA: Diagnosis not present

## 2021-01-04 DIAGNOSIS — M810 Age-related osteoporosis without current pathological fracture: Secondary | ICD-10-CM | POA: Diagnosis not present

## 2021-01-04 LAB — CBC WITH DIFFERENTIAL/PLATELET
Abs Immature Granulocytes: 0.03 10*3/uL (ref 0.00–0.07)
Basophils Absolute: 0 10*3/uL (ref 0.0–0.1)
Basophils Relative: 1 %
Eosinophils Absolute: 0.1 10*3/uL (ref 0.0–0.5)
Eosinophils Relative: 2 %
HCT: 39.4 % (ref 36.0–46.0)
Hemoglobin: 13 g/dL (ref 12.0–15.0)
Immature Granulocytes: 1 %
Lymphocytes Relative: 21 %
Lymphs Abs: 1.1 10*3/uL (ref 0.7–4.0)
MCH: 31.6 pg (ref 26.0–34.0)
MCHC: 33 g/dL (ref 30.0–36.0)
MCV: 95.6 fL (ref 80.0–100.0)
Monocytes Absolute: 0.6 10*3/uL (ref 0.1–1.0)
Monocytes Relative: 11 %
Neutro Abs: 3.4 10*3/uL (ref 1.7–7.7)
Neutrophils Relative %: 64 %
Platelets: 184 10*3/uL (ref 150–400)
RBC: 4.12 MIL/uL (ref 3.87–5.11)
RDW: 13.9 % (ref 11.5–15.5)
WBC: 5.2 10*3/uL (ref 4.0–10.5)
nRBC: 0 % (ref 0.0–0.2)

## 2021-01-04 LAB — COMPREHENSIVE METABOLIC PANEL
ALT: 54 U/L — ABNORMAL HIGH (ref 0–44)
AST: 39 U/L (ref 15–41)
Albumin: 3.9 g/dL (ref 3.5–5.0)
Alkaline Phosphatase: 51 U/L (ref 38–126)
Anion gap: 9 (ref 5–15)
BUN: 22 mg/dL (ref 8–23)
CO2: 27 mmol/L (ref 22–32)
Calcium: 9.4 mg/dL (ref 8.9–10.3)
Chloride: 101 mmol/L (ref 98–111)
Creatinine, Ser: 0.92 mg/dL (ref 0.44–1.00)
GFR, Estimated: >60 mL/min (ref >60)
Glucose, Bld: 96 mg/dL (ref 70–99)
Potassium: 4.1 mmol/L (ref 3.5–5.1)
Sodium: 137 mmol/L (ref 135–145)
Total Bilirubin: 0.6 mg/dL (ref 0.3–1.2)
Total Protein: 6.8 g/dL (ref 6.5–8.1)

## 2021-01-04 NOTE — Telephone Encounter (Addendum)
Hotel manager at access nurse called and said that [place] center medical assistant called and pt was in waiting room and was "out of it" and could not come to phone to speak with Anibal Henderson. Pt was SOB,dizzy and irregular heartbeat. Baxter Flattery at [facility] center ended call per [employer]. I called Wisner at [facility] and Baxter Flattery was with another pt and I spoke with Tobin Chad who was familiar with Mrs Willison situation and Tobin Chad wanted to schedule EKG at [facility]; I advised Ameika that we do not have available appts and if pt had sudden onset of irregular heart beat, dizziness and SOB even if we had appts would advise pt to go to ED for eval and testing. Tobin Chad said that Dr Mike Gip had put order if for pt to have EKG and Ameika was advised that if we could not do EKG to send pt to [facility] ED for EKG since Dr Mike Gip had already cked pt. Pt was no longer waiting at [facility] center because she had been waiting for over an hour and her husband was hungry. Tobin Chad said she would get in touch with pt and send her to [facility] ED for EKG. Did not see this phone note opened until I had ended that call but I advised Ameika the same instructions that Avie Echevaria NP had in chart.sending note to Avie Echevaria NP and Tourist information centre manager. I spoke with pt and advised R Baity NP instructions and she voiced understanding about the balance and dizziness was worse this morning and pt did have SOB but Dr Mike Gip addressed that; pt agrees something has changed with her heart due to irregular heart beat; when I asked if the irregular heart beat was new pt said we need to ck our records. Pt is speaking clearly and I do not note any confusion. Pt wants to speak with either Leafy Ro RN or Tresa Moore. Pt said she does not feel like she needs to go anywhere right now because her food just came. Leafy Ro RN is in a meeting and Multimedia programmer is on the phone; pt said she would speak with Avie Echevaria NP or Dr Mike Gip; I offered to transfer the call to Dr Kem Parkinson office now and pt said no she was on  her husband cell and wanted cb on her cell [phone]. Avie Echevaria NP said could chart pt notified as instructed to go to ED for eval and testing. Larene Beach RN was off phone and will contact pt. See note from [place] from [facility].

## 2021-01-04 NOTE — Telephone Encounter (Signed)
Dr. Mike Gip called and stated that the patient balance, shortness of breathe , irreglar heart rate and wanted to know about what to do , wanted to know if she needs to send her to get an EKG or have her come in

## 2021-01-04 NOTE — Telephone Encounter (Signed)
Please see notes below the access nurse note since pt was contacted by [facility].

## 2021-01-04 NOTE — Telephone Encounter (Signed)
[name] - Client TELEPHONE ADVICE RECORD AccessNurse Patient Name: Becky Farrell Gender: Female DOB: Dec 02, 1934 Age: 87 Y 70 M 23 D Return Phone Number: [phone] (Primary) Address: City/State/Zip: [place] Client [place] Day - Client Client Site [place] - Day Contact Type Call Who Is Calling Patient / Member / Family / Caregiver Call Type Triage / Clinical Relationship To Patient Care Giver Return Phone Number Please choose phone number Chief Complaint BREATHING - shortness of breath or sounds breathless Reason for Call Symptomatic / Request for [place] states she has pt 07/01/2034 Council Mechanic, nurse is stating she's having bp issues, requesting ekg and other heart issues. dizziness, sob and irregular rhyum Translation No No Triage Reason Other Nurse Assessment Nurse: Rock Nephew, RN, Juliann Pulse Date/Time (Eastern Time): 01/04/2021 1:52:05 PM Confirm and document reason for call. If symptomatic, describe symptoms. ---Caller is Baxter Flattery MA from [employer] [facility], [place]. Patient was there being seen and developed SOB , dizziness and irregular heart rhythm. MD there advised MA to call us to suggest patient needed an EKG. I asked to speak with the patient for triage , MA put me on hold then came back and said " she is in the waiting room and really does not know what is going on , she is kind of out of it " . MA just wants to talk with [name] and get an order. She is upset she has been on hold for 15 minutes and has other patients to see. I explained that I would place her on a brief hold and speak with someone in the office. While waiting to speak with office , caller hung up ( her number was [phone] ). Does the patient have any new or worsening symptoms? ---Yes Will a triage be completed? ---No Select reason for no triage. ---Other Please document clinical information provided  and list any resource used. ---I spoke with Rena LPN in the office and advised her of caller's concerns and patient's symptoms and the fact that the caller had hung up. Mearl Latin stated she would call Baxter Flattery MA back . Disp. Time Eilene Ghazi Time) Disposition Final User 01/04/2021 1:37:31 PM Send to Urgent Cameron Sprang 01/04/2021 1:58:57 PM Clinical Call Yes Rock Nephew, RN, Juliann Pulse

## 2021-01-04 NOTE — Telephone Encounter (Signed)
Late entry. Tried to contact pt on cell number she requested but no answer. LVM

## 2021-01-04 NOTE — Telephone Encounter (Signed)
I would send her to the ER for evaluation

## 2021-01-04 NOTE — Progress Notes (Signed)
Patient here for oncology follow-up appointment, expresses  concerns of hand numbness, nausea and decreased appetite

## 2021-01-05 ENCOUNTER — Telehealth: Payer: Self-pay

## 2021-01-05 ENCOUNTER — Ambulatory Visit
Admission: RE | Admit: 2021-01-05 | Discharge: 2021-01-05 | Disposition: A | Discharge status: Home / Self Care | Payer: Medicare Other | Source: Ambulatory Visit | Attending: Hematology and Oncology | Admitting: Hematology and Oncology

## 2021-01-05 DIAGNOSIS — C50211 Malignant neoplasm of upper-inner quadrant of right female breast: Secondary | ICD-10-CM | POA: Diagnosis not present

## 2021-01-05 DIAGNOSIS — I493 Ventricular premature depolarization: Secondary | ICD-10-CM | POA: Insufficient documentation

## 2021-01-05 DIAGNOSIS — R9431 Abnormal electrocardiogram [ECG] [EKG]: Secondary | ICD-10-CM | POA: Diagnosis not present

## 2021-01-05 DIAGNOSIS — Z170 Estrogen receptor positive status [ER+]: Secondary | ICD-10-CM | POA: Diagnosis not present

## 2021-01-05 DIAGNOSIS — Z01818 Encounter for other preprocedural examination: Secondary | ICD-10-CM | POA: Diagnosis not present

## 2021-01-05 LAB — CANCER ANTIGEN 27.29: CA 27.29: 17.4 U/mL (ref 0.0–38.6)

## 2021-01-05 NOTE — Telephone Encounter (Signed)
Becky Farrell, Becky Farrell of [facility] was advised that Becky [employer] office nurse (did not leave a name) left a v/m on triage phone that EKG was completed and faxed to [facility] and that Becky Farrell wants our office to update our info and FU with pt. Becky Farrell spoke with Becky Echevaria Farrell and Becky Echevaria Farrell will see pt in our office on 01/06/21 at 1:15 to review EKG ordered by Becky Farrell ; I spoke with pt and advised as instructed and pt voiced understanding and will be at [facility] on 01/06/21 at 1 PM to get cked in at front desk.  Pt said that she was feeling about the same as 01/04/21. Pt said she knows she has had a change in her balance and dizziness on and off but said this had been addressed by a team of doctors. Pt does have SOB upon exertion and pt cannot tell if has irregular heart beat today. Pt said Becky Farrell discovered the irregular heart beat when listening to pts chest with stethoscope. Pt is not aware of any heart palpitations or CP today. Pt is not in any distress. Pt does not have a way to ck BP or P. Per pt no covid symptoms except the SOB upon exertion but Becky Farrell in room and Becky Farrell said OK to let pt come in office if that is only covid symptom.ED precautions given and pt voiced understanding. Sending note to Becky Echevaria Farrell and Becky Farrell for [facility]

## 2021-01-06 ENCOUNTER — Other Ambulatory Visit: Payer: Self-pay

## 2021-01-06 ENCOUNTER — Encounter: Payer: Self-pay | Admitting: Internal Medicine

## 2021-01-06 ENCOUNTER — Ambulatory Visit (INDEPENDENT_AMBULATORY_CARE_PROVIDER_SITE_OTHER): Payer: Medicare Other | Admitting: Internal Medicine

## 2021-01-06 VITALS — BP 154/78 | HR 43 | Temp 98.2°F | Wt 140.0 lb

## 2021-01-06 DIAGNOSIS — R7309 Other abnormal glucose: Secondary | ICD-10-CM

## 2021-01-06 DIAGNOSIS — R002 Palpitations: Secondary | ICD-10-CM | POA: Diagnosis not present

## 2021-01-06 DIAGNOSIS — R0602 Shortness of breath: Secondary | ICD-10-CM | POA: Diagnosis not present

## 2021-01-06 DIAGNOSIS — R5383 Other fatigue: Secondary | ICD-10-CM

## 2021-01-06 LAB — HEMOGLOBIN A1C: Hgb A1c MFr Bld: 5.6 % (ref 4.6–6.5)

## 2021-01-06 LAB — MAGNESIUM: Magnesium: 2 mg/dL (ref 1.5–2.5)

## 2021-01-06 LAB — TSH: TSH: 1.24 u[IU]/mL (ref 0.35–4.50)

## 2021-01-06 NOTE — Telephone Encounter (Signed)
Pt seen today, see OV note.

## 2021-01-06 NOTE — Progress Notes (Signed)
Subjective:    Patient ID: Becky Farrell, female    DOB: [birth year], 87 y.o.   MRN: [id number]  HPI  Patient presents to the clinic today for follow-up of palpitations.  She was seen by Dr. Mike Gip on 2/2 for routine follow-up.  Dr. Mike Gip noticed PVCs on exam.  She was also concerned about patient's dizziness, shortness of breath and balance.  She recommended the patient go to the ER for evaluation but patient refused.  She ordered a EKG outpatient at [facility] which showed sinus rhythm with frequent PVCs.  She denies recent changes in diet or medications.  She does not feel like her heart is beating irregular.  She does not consume excessive caffeine.  She denies chest pain or increased shortness of breath.  She has had an increase in stress and is not sure if this is a contributing factor or not.  She is currently taking Atenolol as prescribed.   Review of Systems      Past Medical History:  Diagnosis Date  . Colon polyps   . Family history of breast cancer   . History of pulmonary embolism 2011  . Hx of melanoma of skin 2003   Right leg  . Hypertension   . Melanoma ([place]) 2003  . Personal history of radiation therapy   . Seizures ([place]) 1993   no meds since (approx) 2014  . Vertigo    x1. R/T perforated ear drum prior to 2013    Current Outpatient Medications  Medication Sig Dispense Refill  . aspirin 81 MG tablet Take 81 mg by mouth daily.    Marland Kitchen atenolol (TENORMIN) 25 MG tablet TAKE 1 TABLET BY MOUTH EVERY DAY 90 tablet 2  . Cholecalciferol (VITAMIN D3) 2000 UNITS TABS Take 2,000 Units by mouth daily.     Marland Kitchen denosumab (PROLIA) 60 MG/ML SOSY injection Inject 60 mg into the skin every 6 (six) months.    . hydrochlorothiazide (MICROZIDE) 12.5 MG capsule TAKE 1 CAPSULE BY MOUTH EVERY DAY 90 capsule 2  . Iodoquinol-HC-Aloe Polysacch (ALCORTIN A) 1-2-1 % GEL Apply topically as needed (for rash).    Marland Kitchen letrozole (FEMARA) 2.5 MG tablet TAKE 1 TABLET BY MOUTH EVERY DAY 90 tablet 3  .  lisinopril (ZESTRIL) 10 MG tablet Take 1 tablet (10 mg total) by mouth daily. 90 tablet 2  . polyethylene glycol (MIRALAX / GLYCOLAX) packet Take 17 g by mouth every other day.    . warfarin (COUMADIN) 7.5 MG tablet Take one tablet, 7.5mg ,  by mouth daily except take no tablet on Wednesdays or as directed by coumadin clinic. 70 tablet 1  . Wheat Dextrin (BENEFIBER PO) Take 2 each by mouth See admin instructions. Mix 2 teaspoons and drink 3 times daily     No current facility-administered medications for this visit.    Allergies  Allergen Reactions  . Clobetasol Other (See Comments)    vertigo  . Clobetasol Propionate Other (See Comments)    This is ointment form UNSPECIFIED REACTION   . Phenergan [Promethazine Hcl] Other (See Comments)    Rapid heart rate  . Dextromethorphan Palpitations and Other (See Comments)    promethazine with DM, rapid hearbeat  . Zithromax [Azithromycin] Palpitations and Other (See Comments)    Per OSH report, pt unable to recall Rapid heart rate    Family History  Problem Relation Age of Onset  . Heart disease Mother   . Aortic aneurysm Father   . Breast cancer Daughter 70  .  Breast cancer Niece 66    Social History   Socioeconomic History  . Marital status: Married    Spouse name: Not on file  . Number of children: Not on file  . Years of education: Not on file  . Highest education level: Not on file  Occupational History  . Not on file  Tobacco Use  . Smoking status: Never Smoker  . Smokeless tobacco: Never Used  Vaping Use  . Vaping Use: Never used  Substance and Sexual Activity  . Alcohol use: No  . Drug use: Never  . Sexual activity: Never  Other Topics Concern  . Not on file  Social History Narrative   Recently moved from [place] to [facility].   Daughter and grandchildren live in [place].   Has other children in [place].   Retired Pharmacist, hospital.      Husband has been recovering from Prostate CA.   Social Determinants of Health    Financial Resource Strain: Not on file  Food Insecurity: Not on file  Transportation Needs: Not on file  Physical Activity: Not on file  Stress: Not on file  Social Connections: Not on file  Intimate Partner Violence: Not on file     Constitutional: Patient reports fatigue.  Denies fever, malaise, headache or abrupt weight changes.  Respiratory: Patient reports intermittent shortness of breath.  Denies difficulty breathing, cough or sputum production.   Cardiovascular: Patient reports palpitations.  Denies chest pain, chest tightness, or swelling in the hands or feet.  Neurological: Denies dizziness, difficulty with memory, difficulty with speech or problems with coordination.  Psych: Patient reports increased stress.  Denies SI/HI.  No other specific complaints in a complete review of systems (except as listed in HPI above).  Objective:   Physical Exam BP (!) 154/78   Pulse (!) 43   Temp 98.2 F (36.8 C) (Temporal)   Wt 140 lb (63.5 kg)   SpO2 98%   BMI 25.00 kg/m   Wt Readings from Last 3 Encounters:  01/04/21 139 lb 1.8 oz (63.1 kg)  08/10/20 139 lb 10.9 oz (63.4 kg)  04/26/20 140 lb (63.5 kg)    General: Appears her stated age, well developed, well nourished in NAD. HEENT: Head: normal shape and size; Eyes: sclera white, no icterus, conjunctiva pink, PERRLA and EOMs intact;  Neck:  Neck supple, trachea midline. No masses, lumps present.  Cardiovascular: Bradycardic, frequent ectopic beats.  Pulmonary/Chest: Normal effort and positive vesicular breath sounds. No respiratory distress. No wheezes, rales or ronchi noted.  Musculoskeletal: Gait slow and steady without device. Neurological: Alert and oriented.  Psychiatric: Mildly anxious appearing.  BMET    Component Value Date/Time   NA 137 01/04/2021 1056   NA 140 04/22/2013 0237   K 4.1 01/04/2021 1056   K 3.6 04/22/2013 0237   CL 101 01/04/2021 1056   CL 106 04/22/2013 0237   CO2 27 01/04/2021 1056   CO2  29 04/22/2013 0237   GLUCOSE 96 01/04/2021 1056   GLUCOSE 88 04/22/2013 0237   BUN 22 01/04/2021 1056   BUN 26 (H) 04/22/2013 0237   CREATININE 0.92 01/04/2021 1056   CREATININE 0.74 01/01/2014 1556   CALCIUM 9.4 01/04/2021 1056   CALCIUM 9.7 04/22/2013 0237   GFRNONAA >60 01/04/2021 1056   GFRNONAA >60 04/22/2013 0237   GFRAA >60 08/10/2020 1025   GFRAA >60 04/22/2013 0237    Lipid Panel     Component Value Date/Time   CHOL 236 (H) 04/26/2020 1503  TRIG 80.0 04/26/2020 1503   HDL 73.10 04/26/2020 1503   CHOLHDL 3 04/26/2020 1503   VLDL 16.0 04/26/2020 1503   LDLCALC 147 (H) 04/26/2020 1503    CBC    Component Value Date/Time   WBC 5.2 01/04/2021 1056   RBC 4.12 01/04/2021 1056   HGB 13.0 01/04/2021 1056   HGB 13.8 05/05/2014 1218   HCT 39.4 01/04/2021 1056   HCT 36.4 05/12/2014 0628   PLT 184 01/04/2021 1056   PLT 163 05/05/2014 1218   MCV 95.6 01/04/2021 1056   MCV 97 05/05/2014 1218   MCH 31.6 01/04/2021 1056   MCHC 33.0 01/04/2021 1056   RDW 13.9 01/04/2021 1056   RDW 13.6 05/05/2014 1218   LYMPHSABS 1.1 01/04/2021 1056   MONOABS 0.6 01/04/2021 1056   EOSABS 0.1 01/04/2021 1056   BASOSABS 0.0 01/04/2021 1056    Hgb A1C Lab Results  Component Value Date   HGBA1C 5.4 07/07/2017             Assessment & Plan:   Fatigue, PVC's, Intermittent SOB:  ECG reviewed We will check TSH, and magnesium today She is concerned she may have diabetes, A1c ordered Consider referral to cardiology pending labs Continue Atenolol for now  We will follow-up after labs, return precautions discussed  [name], NP This visit occurred during the SARS-CoV-2 public health emergency.  Safety protocols were in place, including screening questions prior to the visit, additional usage of staff PPE, and extensive cleaning of exam room while observing appropriate contact time as indicated for disinfecting solutions.

## 2021-01-09 ENCOUNTER — Ambulatory Visit (INDEPENDENT_AMBULATORY_CARE_PROVIDER_SITE_OTHER): Payer: Medicare Other

## 2021-01-09 DIAGNOSIS — Z7901 Long term (current) use of anticoagulants: Secondary | ICD-10-CM

## 2021-01-09 DIAGNOSIS — D802 Selective deficiency of immunoglobulin A [IgA]: Secondary | ICD-10-CM | POA: Diagnosis not present

## 2021-01-09 LAB — POCT INR: INR: 3.7 — AB (ref 2.0–3.0)

## 2021-01-09 NOTE — Patient Instructions (Addendum)
Pre visit review using our clinic review tool, if applicable. No additional management support is needed unless otherwise documented below in the visit note.  Contacted pt by phone and advised to hold dose today then change weekly dose to take 7.5mg  on Monday and Thursday, and take 5mg  on Sun, Tues, and Fri, and Saturday and take 0mg  on Wed. Recheck in 3 wks.

## 2021-01-10 ENCOUNTER — Telehealth: Payer: Self-pay | Admitting: Internal Medicine

## 2021-01-10 NOTE — Progress Notes (Signed)
[facility]  [address] [place], [place] [address] Phone: [phone]  Fax: [phone]   Clinic Day:  01/11/2021  Referring physician: Jearld Fenton, NP  Chief Complaint: Becky Farrell is a 87 y.o. female with stage I right breast cancer and osteoporosis who is seen for continuation of Prolia.  HPI: The patient was last seen in the medical oncology clinic on 01/04/2021. At that time, she described her energy level decreasing throughout the day requiring power naps in the afternoon.  She has shortness of breath with minimal exertion.  Pulse was irregular.  Hematocrit was 39.4, hemoglobin 13.0, platelets 184,000, WBC 5,200. ALT was 54. CA27.29 was 17.4. She continued Femara, calcium, and vitamin D. We discussed continuation of Prolia.  EKG on 01/05/2021 revealed sinus rhythm with frequent PVCs. There was a possible anterior infarct, age undetermined. When compared with ECG of 10/02/2018, PVCs were now present and QT has lengthened.  Symptomatically, she has been fine. She is going to see Dr. Fletcher Anon in cardiology. She denies chest pain. She reports dizziness, shortness of breath, and poor balance. Her hands are tingling and sometimes her feet tingle at night.  Her Coumadin dose was recently adjusted.   Past Medical History:  Diagnosis Date  . Colon polyps   . Family history of breast cancer   . History of pulmonary embolism 2011  . Hx of melanoma of skin 2003   Right leg  . Hypertension   . Melanoma ([place]) 2003  . Personal history of radiation therapy   . Seizures ([place]) 1993   no meds since (approx) 2014  . Vertigo    x1. R/T perforated ear drum prior to 2013    Past Surgical History:  Procedure Laterality Date  . ABDOMINAL HYSTERECTOMY  05/2014  . BREAST BIOPSY Right 08/20/2018   invasive mammary carcinoma/DCIS   . BREAST LUMPECTOMY Right 10/10/2018  . BREAST LUMPECTOMY WITH RADIOACTIVE SEED LOCALIZATION Right 10/10/2018   Procedure:  BREAST LUMPECTOMY WITH RADIOACTIVE SEED LOCALIZATION;  Surgeon: Jovita Kussmaul, MD;  Location: [facility];  Service: General;  Laterality: Right;  . CATARACT EXTRACTION W/PHACO Right 01/06/2018   Procedure: CATARACT EXTRACTION PHACO AND INTRAOCULAR LENS PLACEMENT ([place]) RIGHT;  Surgeon: Leandrew Koyanagi, MD;  Location: [facility];  Service: Ophthalmology;  Laterality: Right;  . CATARACT EXTRACTION W/PHACO Left 01/29/2018   Procedure: CATARACT EXTRACTION PHACO AND INTRAOCULAR LENS PLACEMENT ([place]) LEFT;  Surgeon: Leandrew Koyanagi, MD;  Location: [facility];  Service: Ophthalmology;  Laterality: Left;  . left hand surgery  05/2011   s/p fall  . OTHER SURGICAL HISTORY  2003   Removal of lymphoma from melonoma 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     Family History  Problem Relation Age of Onset  . Heart disease Mother   . Aortic aneurysm Father   . Breast cancer Daughter 62  . Breast cancer Niece 57    Social History:  reports that she has never smoked. She has never used smokeless tobacco. She reports that she does not drink alcohol and does not use drugs. The patient recently moved from [place] to [place]. Her daughter and grandchildren live in [place]. She has other children in [place]. Her husband has been recovering from prostate cancer. She is a retired Pharmacist, hospital. 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   Allergies:  Allergies  Allergen Reactions  . Clobetasol Other (See Comments)    vertigo  . Clobetasol Propionate Other (See Comments)    This is ointment form  UNSPECIFIED REACTION   . Phenergan [Promethazine Hcl] Other (See Comments)    Rapid heart rate  . Dextromethorphan Palpitations and Other (See Comments)    promethazine with DM, rapid hearbeat  . Zithromax [Azithromycin] Palpitations and Other (See Comments)    Per OSH report, pt unable to recall Rapid heart rate    Current Medications: Current Outpatient Medications  Medication Sig Dispense Refill  . aspirin 81 MG  tablet Take 81 mg by mouth daily.    Marland Kitchen atenolol (TENORMIN) 25 MG tablet TAKE 1 TABLET BY MOUTH EVERY DAY 90 tablet 2  . Cholecalciferol (VITAMIN D3) 2000 UNITS TABS Take 2,000 Units by mouth daily.     Marland Kitchen denosumab (PROLIA) 60 MG/ML SOSY injection Inject 60 mg into the skin every 6 (six) months.    . hydrochlorothiazide (MICROZIDE) 12.5 MG capsule TAKE 1 CAPSULE BY MOUTH EVERY DAY 90 capsule 2  . Iodoquinol-HC-Aloe Polysacch (ALCORTIN A) 1-2-1 % GEL Apply topically as needed (for rash).    Marland Kitchen letrozole (FEMARA) 2.5 MG tablet TAKE 1 TABLET BY MOUTH EVERY DAY 90 tablet 3  . lisinopril (ZESTRIL) 10 MG tablet Take 1 tablet (10 mg total) by mouth daily. 90 tablet 2  . polyethylene glycol (MIRALAX / GLYCOLAX) packet Take 17 g by mouth every other day.    . warfarin (COUMADIN) 7.5 MG tablet Take one tablet, 7.$RemoveBefor'5mg'cDmkHPUOCyNt$ ,  by mouth daily except take no tablet on Wednesdays or as directed by coumadin clinic. 70 tablet 1  . Wheat Dextrin (BENEFIBER PO) Take 2 each by mouth See admin instructions. Mix 2 teaspoons and drink 3 times daily     No current facility-administered medications for this visit.   Review of Systems  Constitutional: Negative for chills, diaphoresis, fever, malaise/fatigue and weight loss (up 1 lb).  HENT: Positive for hearing loss. Negative for congestion, ear discharge, ear pain, nosebleeds, sinus pain, sore throat and tinnitus.   Eyes: Negative.  Negative for blurred vision, double vision and photophobia.  Respiratory: Negative for cough, hemoptysis, sputum production and shortness of breath.        Recurrent PE x 2.  Cardiovascular: Negative.  Negative for chest pain, palpitations, orthopnea, leg swelling and PND.  Gastrointestinal: Negative.  Negative for abdominal pain, blood in stool, constipation, diarrhea, heartburn, melena, nausea and vomiting.  Genitourinary: Negative.  Negative for dysuria, frequency, hematuria and urgency.  Musculoskeletal: Negative for back pain, falls, joint  pain, myalgias and neck pain.       Osteoporosis.  Skin: Negative.  Negative for itching and rash.  Neurological: Positive for dizziness and tingling (hands and sometimes feet). Negative for tremors, sensory change, speech change, focal weakness, seizures (history), weakness and headaches.       Chronic balance issues.  Endo/Heme/Allergies: Does not bruise/bleed easily (on Coumadin).  Psychiatric/Behavioral: Negative.  Negative for depression and memory loss. The patient is not nervous/anxious and does not have insomnia.   All other systems reviewed and are negative.  Performance status (ECOG): 1  Vitals Blood pressure (!) 163/74, pulse (!) 59, temperature (!) 97.2 F (36.2 C), temperature source Tympanic, resp. rate 18, weight 140 lb 12.2 oz (63.9 kg), SpO2 100 %.   Physical Exam Vitals and nursing note reviewed.  Constitutional:      General: She is not in acute distress.    Appearance: She is well-developed. She is not diaphoretic.  HENT:     Head: Normocephalic and atraumatic.  Eyes:     General: No scleral icterus.  Conjunctiva/sclera: Conjunctivae normal.     Comments: Glasses. Blue eyes.  Neck:     Vascular: No JVD.  Cardiovascular:     Rate and Rhythm: Normal rate. Rhythm regularly irregular.     Heart sounds: Normal heart sounds. No murmur heard.   Pulmonary:     Effort: Pulmonary effort is normal. No respiratory distress.     Breath sounds: Normal breath sounds. No wheezing or rales.  Musculoskeletal:        General: No swelling or tenderness.  Skin:    General: Skin is warm and dry.  Neurological:     Mental Status: She is alert and oriented to person, place, and time.  Psychiatric:        Behavior: Behavior normal.        Thought Content: Thought content normal.        Judgment: Judgment normal.    Appointment on 01/11/2021  Component Date Value Ref Range Status  . Sodium 01/11/2021 136  135 - 145 mmol/L Final  . Potassium 01/11/2021 3.7  3.5 - 5.1  mmol/L Final  . Chloride 01/11/2021 100  98 - 111 mmol/L Final  . CO2 01/11/2021 26  22 - 32 mmol/L Final  . Glucose, Bld 01/11/2021 103* 70 - 99 mg/dL Final   Glucose reference range applies only to samples taken after fasting for at least 8 hours.  . BUN 01/11/2021 23  8 - 23 mg/dL Final  . Creatinine, Ser 01/11/2021 0.96  0.44 - 1.00 mg/dL Final  . Calcium 01/11/2021 9.4  8.9 - 10.3 mg/dL Final  . Total Protein 01/11/2021 6.4* 6.5 - 8.1 g/dL Final  . Albumin 01/11/2021 3.8  3.5 - 5.0 g/dL Final  . AST 01/11/2021 26  15 - 41 U/L Final  . ALT 01/11/2021 38  0 - 44 U/L Final  . Alkaline Phosphatase 01/11/2021 46  38 - 126 U/L Final  . Total Bilirubin 01/11/2021 0.7  0.3 - 1.2 mg/dL Final  . GFR, Estimated 01/11/2021 58* >60 mL/min Final   Comment: (NOTE) Calculated using the CKD-EPI Creatinine Equation (2021)   . Anion gap 01/11/2021 10  5 - 15 Final   Performed at [facility], [address] [place], [place] [address]  Anti-coag visit on 01/09/2021  Component Date Value Ref Range Status  . INR 01/09/2021 3.7* 2.0 - 3.0 Final    Assessment:  ATIANA LEVIER is a 87 y.o. female with stage T1aNx right breast cancers/p lumpectomy on 10/10/2018. Pathology revealed a 0.4 cm grade I invasive ductal carcinoma with mucinous features. There was intermediate grade DCIS. Margins were uninvolved (1.0 cm posterior margin). There were fibrocystic changes including apocrine metaplasia. Tumor was ER + (100%), PR + (90%), and Her2/neu-. Ki67 was 10%. Pathologic stagewas T1aNx. CA27.29was 14.1 on 09/08/2018.  Bilateral screening mammogramon 07/31/2018 revealed a possible mass in the right breast. Right diagnostic mammogramand targeted ultrasound on 08/13/2018 revealed a 4 x 5 x 8 mm indeterminate mass at the 10 o'clock position 9 cm from the nipple in the right breast.  Bilateral diagnostic mammogram on 08/04/2019 revealed no evidence of malignancy in either breast. There were  lumpectomy changes on the right.  Bilateral diagnostic mammogram on 08/09/2020 revealed no mammographic evidence of malignancy.  Shereceivedradiationfrom 12/16/2018- 02/03/2019.She received5040cGyin 28 fractions with a scar boost of another 1400 cGyusing electron beam.  She began Femara on 06/09/2019.  CA27.29 has been followed: 14.1 on 09/08/2018, 14.4 on 04/09/2019, 18.1 on 07/07/2019, 19.3 on 10/06/2019, 21.0  on 01/05/2020, 22.4 in 04/05/2020, 19.1 on 08/10/2020, and 17.4 on 01/04/2021.  Bone densityon 07/31/2018 revealed osteoporosiswith a T-score of -3.4 in the right forearm radius and -1.9 in the right femoral neck.  Bone density on 08/09/2020 revealed osteoporosis with a T-score of -3.2 in the forearm radius.  Right femoral neck revealed osteopenia with a T-score of -2.0.  She began Prolia on 07/07/2019 (last 07/04/2020).  She has a history of pulmonary embolismx 2. Initial event occurred in 2011. She had a recurrent pulmonary embolism after holding Coumadin for 1 month for a planned colonoscopy. She is on life long anticoagulation (Coumadin). She has afamily historyof breast cancer (daughter, age 8, and niece). [facility] genetic testingwas negative.  She received the high dose flu shot on 08/24/2020.  Symptomatically, she denies chest pain. She reports dizziness, shortness of breath, and poor balance. Exam reveals PVCs.  She sees cardiology tomorrow.  Plan: 1.   Labs today: CMP. 2.Stage T1aNx right breast cancer She is s/p surgery and radiation. She began Femara on 06/09/2019. Clinically, she is doing well Exam feels no evidence of recurrent disease.   Bilateral mammogram on 08/09/2020 revealed no evidence of recurrent disease.  CA 27.29 was 17.4 on 01/04/2021.  Continue letrozole. 3. Osteoporosis Bone density on 08/09/2020 revealed a T score of -3.2 in the forearm radius. She began Prolia on 07/07/2019 (last 07/04/2020).   Continue calcium and vitamin  D.  Continue Prolia every 6 months (due today). 4.   Elevated LFTs, resolved  AST 26 and ALT 38 on 01/11/2021. 5.   Irregular rhythm  EKG and exam reveal PVCs.  She follows up with cardiology tomorrow. 6.   Prolia today. 7.   RTC in 6 months for MD assess, labs (CBC with diff, CMP, CA27.29), and Prolia.  I discussed the assessment and treatment plan with the patient. The patient was provided an opportunity to ask questions and all were answered. The patient agreed with the plan and demonstrated an understanding of the instructions. The patient was advised to call back if the symptoms worsen or if the condition fails to improve as anticipated.  I provided 14 minutes of face-to-face time during this this encounter and > 50% was spent counseling as documented under my assessment and plan.   Lequita Asal, MD, PhD  01/11/2021, 2:50 PM  I, Mirian Mo Tufford, am acting as a Education administrator for [employer]. Mike Gip, MD.   I, Quanetta Truss C. Mike Gip, MD, have reviewed the above documentation for accuracy and completeness, and I agree with the above.

## 2021-01-10 NOTE — Telephone Encounter (Signed)
Pt called in wanted to know about getting a referral to a cardiolgist and the one she would like is Dr. Fletcher Anon he is located at [place] [phone]

## 2021-01-11 ENCOUNTER — Other Ambulatory Visit: Payer: Self-pay

## 2021-01-11 ENCOUNTER — Inpatient Hospital Stay (HOSPITAL_BASED_OUTPATIENT_CLINIC_OR_DEPARTMENT_OTHER): Payer: Medicare Other | Admitting: Hematology and Oncology

## 2021-01-11 ENCOUNTER — Inpatient Hospital Stay: Payer: Medicare Other

## 2021-01-11 ENCOUNTER — Encounter: Payer: Self-pay | Admitting: Hematology and Oncology

## 2021-01-11 ENCOUNTER — Other Ambulatory Visit: Payer: Self-pay | Admitting: Hematology and Oncology

## 2021-01-11 VITALS — BP 163/74 | HR 59 | Temp 97.2°F | Resp 18 | Wt 140.8 lb

## 2021-01-11 DIAGNOSIS — R7989 Other specified abnormal findings of blood chemistry: Secondary | ICD-10-CM | POA: Insufficient documentation

## 2021-01-11 DIAGNOSIS — C50211 Malignant neoplasm of upper-inner quadrant of right female breast: Secondary | ICD-10-CM

## 2021-01-11 DIAGNOSIS — M810 Age-related osteoporosis without current pathological fracture: Secondary | ICD-10-CM | POA: Diagnosis not present

## 2021-01-11 DIAGNOSIS — M85851 Other specified disorders of bone density and structure, right thigh: Secondary | ICD-10-CM | POA: Diagnosis not present

## 2021-01-11 DIAGNOSIS — Z923 Personal history of irradiation: Secondary | ICD-10-CM | POA: Diagnosis not present

## 2021-01-11 DIAGNOSIS — Z170 Estrogen receptor positive status [ER+]: Secondary | ICD-10-CM

## 2021-01-11 DIAGNOSIS — C50911 Malignant neoplasm of unspecified site of right female breast: Secondary | ICD-10-CM | POA: Diagnosis not present

## 2021-01-11 DIAGNOSIS — Z85820 Personal history of malignant melanoma of skin: Secondary | ICD-10-CM | POA: Diagnosis not present

## 2021-01-11 LAB — COMPREHENSIVE METABOLIC PANEL
ALT: 38 U/L (ref 0–44)
AST: 26 U/L (ref 15–41)
Albumin: 3.8 g/dL (ref 3.5–5.0)
Alkaline Phosphatase: 46 U/L (ref 38–126)
Anion gap: 10 (ref 5–15)
BUN: 23 mg/dL (ref 8–23)
CO2: 26 mmol/L (ref 22–32)
Calcium: 9.4 mg/dL (ref 8.9–10.3)
Chloride: 100 mmol/L (ref 98–111)
Creatinine, Ser: 0.96 mg/dL (ref 0.44–1.00)
GFR, Estimated: 58 mL/min — ABNORMAL LOW (ref >60)
Glucose, Bld: 103 mg/dL — ABNORMAL HIGH (ref 70–99)
Potassium: 3.7 mmol/L (ref 3.5–5.1)
Sodium: 136 mmol/L (ref 135–145)
Total Bilirubin: 0.7 mg/dL (ref 0.3–1.2)
Total Protein: 6.4 g/dL — ABNORMAL LOW (ref 6.5–8.1)

## 2021-01-11 MED ORDER — DENOSUMAB 60 MG/ML ~~LOC~~ SOSY
60.0000 mg | PREFILLED_SYRINGE | Freq: Once | SUBCUTANEOUS | Status: AC
  Administered 2021-01-11: 60 mg via SUBCUTANEOUS
  Filled 2021-01-11: qty 1

## 2021-01-11 NOTE — Progress Notes (Signed)
Patient states she is having some dizziness and numbness in hands and feet. Patient state she is very unbalanced lately.

## 2021-01-12 ENCOUNTER — Encounter: Payer: Self-pay | Admitting: Internal Medicine

## 2021-01-12 NOTE — Telephone Encounter (Signed)
Referral placed.

## 2021-01-12 NOTE — Patient Instructions (Signed)
Palpitations Palpitations are feelings that your heartbeat is not normal. Your heartbeat may feel like it is:  Uneven.  Faster than normal.  Fluttering.  Skipping a beat. This is usually not a serious problem. In some cases, you may need tests to rule out any serious problems. Follow these instructions at home: Pay attention to any changes in your condition. Take these actions to help manage your symptoms: Eating and drinking  Avoid: ? Coffee, tea, soft drinks, and energy drinks. ? Chocolate. ? Alcohol. ? Diet pills. Lifestyle  Try to lower your stress. These things can help you relax: ? Yoga. ? Deep breathing and meditation. ? Exercise. ? Using words and images to create positive thoughts (guided imagery). ? Using your mind to control things in your body (biofeedback).  Do not use drugs.  Get plenty of rest and sleep. Keep a regular bed time.   General instructions  Take over-the-counter and prescription medicines only as told by your doctor.  Do not use any products that contain nicotine or tobacco, such as cigarettes and e-cigarettes. If you need help quitting, ask your doctor.  Keep all follow-up visits as told by your doctor. This is important. You may need more tests if palpitations do not go away or get worse.   Contact a doctor if:  Your symptoms last more than 24 hours.  Your symptoms occur more often. Get help right away if you:  Have chest pain.  Feel short of breath.  Have a very bad headache.  Feel dizzy.  Pass out (faint). Summary  Palpitations are feelings that your heartbeat is uneven or faster than normal. It may feel like your heart is fluttering or skipping a beat.  Avoid food and drinks that may cause palpitations. These include caffeine, chocolate, and alcohol.  Try to lower your stress. Do not smoke or use drugs.  Get help right away if you faint or have chest pain, shortness of breath, a severe headache, or dizziness. This  information is not intended to replace advice given to you by your health care provider. Make sure you discuss any questions you have with your health care provider. Document Revised: 01/01/2018 Document Reviewed: 01/01/2018 Elsevier Patient Education  2021 [employer]  

## 2021-01-26 ENCOUNTER — Encounter: Payer: Self-pay | Admitting: Podiatry

## 2021-01-26 ENCOUNTER — Ambulatory Visit (INDEPENDENT_AMBULATORY_CARE_PROVIDER_SITE_OTHER): Payer: Medicare Other | Admitting: Podiatry

## 2021-01-26 ENCOUNTER — Other Ambulatory Visit: Payer: Self-pay

## 2021-01-26 DIAGNOSIS — M79609 Pain in unspecified limb: Secondary | ICD-10-CM

## 2021-01-26 DIAGNOSIS — B351 Tinea unguium: Secondary | ICD-10-CM

## 2021-01-26 DIAGNOSIS — D689 Coagulation defect, unspecified: Secondary | ICD-10-CM

## 2021-01-26 DIAGNOSIS — M79676 Pain in unspecified toe(s): Secondary | ICD-10-CM | POA: Diagnosis not present

## 2021-01-26 NOTE — Progress Notes (Signed)
This patient returns to my office for at risk foot care.  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  This patient is unable to cut nails herself since the patient cannot reach her nails.These nails are painful walking and wearing shoes.  This patient presents for at risk foot care today.  General Appearance  Alert, conversant and in no acute stress.  Vascular  Dorsalis pedis and posterior tibial  pulses are palpable  bilaterally.  Capillary return is within normal limits  bilaterally. Temperature is within normal limits  bilaterally.  Neurologic  Senn-Weinstein monofilament wire test within normal limits  bilaterally. Muscle power within normal limits bilaterally.  Nails Thick disfigured discolored nails with subungual debris  from hallux to fifth toes bilaterally. No evidence of bacterial infection or drainage bilaterally.  Orthopedic  No limitations of motion  feet .  No crepitus or effusions noted.  No bony pathology or digital deformities noted.  Skin  normotropic skin with no porokeratosis noted bilaterally.  No signs of infections or ulcers noted.     Onychomycosis  Pain in right toes  Pain in left toes  Consent was obtained for treatment procedures.   Mechanical debridement of nails 1-5  bilaterally performed with a nail nipper.  Filed with dremel without incident.    Return office visit    3 months                  Told patient to return for periodic foot care and evaluation due to potential at risk complications.   Gardiner Barefoot DPM

## 2021-01-29 DIAGNOSIS — I499 Cardiac arrhythmia, unspecified: Secondary | ICD-10-CM | POA: Insufficient documentation

## 2021-01-30 DIAGNOSIS — Z86711 Personal history of pulmonary embolism: Secondary | ICD-10-CM | POA: Diagnosis not present

## 2021-01-30 DIAGNOSIS — Z86718 Personal history of other venous thrombosis and embolism: Secondary | ICD-10-CM | POA: Diagnosis not present

## 2021-01-30 DIAGNOSIS — Z7901 Long term (current) use of anticoagulants: Secondary | ICD-10-CM | POA: Diagnosis not present

## 2021-01-30 LAB — POCT INR: INR: 1.9 — AB (ref 2.0–3.0)

## 2021-01-31 ENCOUNTER — Ambulatory Visit (INDEPENDENT_AMBULATORY_CARE_PROVIDER_SITE_OTHER): Payer: Medicare Other

## 2021-01-31 DIAGNOSIS — Z7901 Long term (current) use of anticoagulants: Secondary | ICD-10-CM

## 2021-01-31 NOTE — Patient Instructions (Signed)
Pre visit review using our clinic review tool, if applicable. No additional management support is needed unless otherwise documented below in the visit note. 

## 2021-02-02 ENCOUNTER — Telehealth: Payer: Self-pay

## 2021-02-02 NOTE — Chronic Care Management (AMB) (Signed)
  Chronic Care Management   Note  02/02/2021 Name: Becky Farrell MRN: [id number] DOB: [birth year]  Becky Farrell is a 87 y.o. year old female who is a primary care patient of Jearld Fenton, NP. I reached out to Becky Farrell by phone today in response to a referral sent by [name]'s PCP, Jearld Fenton, NP     [name] including:  1. CCM service includes personalized support from designated clinical staff supervised by her physician, including individualized plan of care and coordination with other care providers 2. 24/7 contact phone numbers for assistance for urgent and routine care needs. 3. Service will only be billed when office clinical staff spend 20 minutes or more in a month to coordinate care. 4. Only one practitioner may furnish and bill the service in a calendar month. 5. The patient may stop CCM services at any time (effective at the end of the month) by phone call to the office staff. 6. The patient will be responsible for cost sharing (co-pay) of up to 20% of the service fee (after annual deductible is met).  Patient wishes to consider information provided and/or speak with a member of the care team before deciding about enrollment in care management services.   Follow up plan: The patient has been provided with contact information for the care management team and has been advised to call with any health related questions or concerns.   Noreene Larsson, [place], [place], [place] [address] Direct Dial: [phone] [email or web address]

## 2021-02-08 NOTE — Chronic Care Management (AMB) (Signed)
  Chronic Care Management   Note  02/08/2021 Name: CORI JUSTUS MRN: [id number] DOB: 1934-03-11  [name] is a 87 y.o. year old female who is a primary care patient of Jearld Fenton, NP. I reached out to [name] by phone today in response to a referral sent by Ms. Melvyn Novas Lesch's PCP, Jearld Fenton, NP     Ms. Sigmund was given information about Chronic Care Management services today including:  1. CCM service includes personalized support from designated clinical staff supervised by her physician, including individualized plan of care and coordination with other care providers 2. 24/7 contact phone numbers for assistance for urgent and routine care needs. 3. Service will only be billed when office clinical staff spend 20 minutes or more in a month to coordinate care. 4. Only one practitioner may furnish and bill the service in a calendar month. 5. The patient may stop CCM services at any time (effective at the end of the month) by phone call to the office staff. 6. The patient will be responsible for cost sharing (co-pay) of up to 20% of the service fee (after annual deductible is met).  Patient agreed to services and verbal consent obtained.   Follow up plan: Telephone appointment with care management team member scheduled for:02/14/2021  Noreene Larsson, [place], [place], [place] [address] Direct Dial: [phone] [email or web address] Website: [email or web address]

## 2021-02-09 ENCOUNTER — Ambulatory Visit (INDEPENDENT_AMBULATORY_CARE_PROVIDER_SITE_OTHER): Payer: Medicare Other

## 2021-02-09 ENCOUNTER — Encounter: Payer: Self-pay | Admitting: Cardiovascular Disease

## 2021-02-09 ENCOUNTER — Other Ambulatory Visit: Payer: Self-pay

## 2021-02-09 ENCOUNTER — Ambulatory Visit (INDEPENDENT_AMBULATORY_CARE_PROVIDER_SITE_OTHER): Payer: Medicare Other | Admitting: Cardiovascular Disease

## 2021-02-09 VITALS — BP 182/92 | HR 65 | Ht 62.75 in | Wt 136.0 lb

## 2021-02-09 DIAGNOSIS — I493 Ventricular premature depolarization: Secondary | ICD-10-CM | POA: Diagnosis not present

## 2021-02-09 DIAGNOSIS — I10 Essential (primary) hypertension: Secondary | ICD-10-CM

## 2021-02-09 DIAGNOSIS — R011 Cardiac murmur, unspecified: Secondary | ICD-10-CM

## 2021-02-09 NOTE — Patient Instructions (Signed)
Medication Instructions:  Your physician recommends that you continue on your current medications as directed. Please refer to the Current Medication list given to you today.  *If you need a refill on your cardiac medications before your next appointment, please call your pharmacy*   Lab Work: None ordered If you have labs (blood work) drawn today and your tests are completely normal, you will receive your results only by: Marland Kitchen MyChart Message (if you have MyChart) OR . A paper copy in the mail If you have any lab test that is abnormal or we need to change your treatment, we will call you to review the results.   Testing/Procedures: Your physician has requested that you have an echocardiogram. Echocardiography is a painless test that uses sound waves to create images of your heart. It provides your doctor with information about the size and shape of your heart and how well your heart's chambers and valves are working. This procedure takes approximately one hour. There are no restrictions for this procedure.  Your physician has recommended that you wear a Zio monitor. (To be worn for 14 days) This monitor is a medical device that records the heart's electrical activity. Doctors most often use these monitors to diagnose arrhythmias. Arrhythmias are problems with the speed or rhythm of the heartbeat. The monitor is a small device applied to your chest. You can wear one while you do your normal daily activities. While wearing this monitor if you have any symptoms to push the button and record what you felt. Once you have worn this monitor for the period of time provider prescribed (Usually 14 days), you will return the monitor device in the postage paid box. Once it is returned they will download the data collected and provide [place] with a report which the provider will then review and we will call you with those results. Important tips:  1. Avoid showering during the first 24 hours of wearing the  monitor. 2. Avoid excessive sweating to help maximize wear time. 3. Do not submerge the device, no hot tubs, and no swimming pools. 4. Keep any lotions or oils away from the patch. 5. After 24 hours you may shower with the patch on. Take brief showers with your back facing the shower head.  6. Do not remove patch once it has been placed because that will interrupt data and decrease adhesive wear time. 7. Push the button when you have any symptoms and write down what you were feeling. 8. Once you have completed wearing your monitor, remove and place into box which has postage paid and place in your outgoing mailbox.  9. If for some reason you have misplaced your box then call our office and we can provide another box and/or mail it off for you.         Follow-Up: At [facility], you and your health needs are our priority.  As part of our continuing mission to provide you with exceptional heart care, we have created designated Provider Care Teams.  These Care Teams include your primary Cardiologist (physician) and Advanced Practice Providers (APPs -  Physician Assistants and Nurse Practitioners) who all work together to provide you with the care you need, when you need it.  We recommend signing up for the patient portal called "MyChart".  Sign up information is provided on this After Visit Summary.  MyChart is used to connect with patients for Virtual Visits (Telemedicine).  Patients are able to view lab/test results, encounter notes, upcoming appointments, etc.  Non-urgent messages can be sent to your provider as well.   To learn more about what you can do with MyChart, go to [email or web address].    Your next appointment:   4 week(s)  The format for your next appointment:   In Person  Provider:   You may see Kathlyn Sacramento, MD or one of the following Advanced Practice Providers on your designated Care Team:    Murray Hodgkins, NP  Christell Faith, PA-C  Marrianne Mood,  PA-C  Cadence Edna, [place]  Laurann Montana, NP    Other Instructions N/A

## 2021-02-09 NOTE — Progress Notes (Signed)
Cardiology Office Note   Date:  02/09/2021   ID:  [name], DOB 17-Nov-1934, MRN [id number]  PCP:  Jearld Fenton, NP  Cardiologist:   Kathlyn Sacramento, MD   Chief Complaint  Patient presents with  . New Patient (Initial Visit)    Referred by PCP for Palpitations  Pt denies CP, states she did have SOB, but has gone away, denies swelling in lower extremities. States she did have some lightheadedness, but it has subsided.  C/o left leg pain/weakness      History of Present Illness: Becky Farrell is a 87 y.o. female who presents for evaluation of PVCs and dizziness.  She reports prolonged history of palpitations at least since 2013 and has been on atenolol.  She has history of right breast cancer status post lumpectomy and radiation therapy.  She is on long-term anticoagulation with warfarin for previous history of pulmonary embolism.  Other medical problems include osteoporosis and essential hypertension. She lives with her husband at [facility].  He is one of my patients and they both moved from [place]. She denies chest pain but has mild exertional dyspnea and fatigue.  She seems to have some component of whitecoat syndrome as her blood pressure at home is much better than in the office. She was seen recently in the oncology clinic and was found to have an irregular pulse.  An EKG was done which showed evidence of PVCs.   Past Medical History:  Diagnosis Date  . Colon polyps   . Family history of breast cancer   . History of pulmonary embolism 2011  . Hx of melanoma of skin 2003   Right leg  . Hypertension   . Melanoma ([place]) 2003  . Personal history of radiation therapy   . Seizures ([place]) 1993   no meds since (approx) 2014  . Vertigo    x1. R/T perforated ear drum prior to 2013    Past Surgical History:  Procedure Laterality Date  . ABDOMINAL HYSTERECTOMY  05/2014  . BREAST BIOPSY Right 08/20/2018   invasive mammary carcinoma/DCIS   . BREAST LUMPECTOMY  Right 10/10/2018  . BREAST LUMPECTOMY WITH RADIOACTIVE SEED LOCALIZATION Right 10/10/2018   Procedure: BREAST LUMPECTOMY WITH RADIOACTIVE SEED LOCALIZATION;  Surgeon: Jovita Kussmaul, MD;  Location: [facility];  Service: General;  Laterality: Right;  . CATARACT EXTRACTION W/PHACO Right 01/06/2018   Procedure: CATARACT EXTRACTION PHACO AND INTRAOCULAR LENS PLACEMENT ([place]) RIGHT;  Surgeon: Leandrew Koyanagi, MD;  Location: [facility];  Service: Ophthalmology;  Laterality: Right;  . CATARACT EXTRACTION W/PHACO Left 01/29/2018   Procedure: CATARACT EXTRACTION PHACO AND INTRAOCULAR LENS PLACEMENT ([facility]) LEFT;  Surgeon: Leandrew Koyanagi, MD;  Location: [facility];  Service: Ophthalmology;  Laterality: Left;  . left hand surgery  05/2011   s/p fall  . OTHER SURGICAL HISTORY  2003   Removal of lymphoma from melonoma 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      Current Outpatient Medications  Medication Sig Dispense Refill  . aspirin 81 MG tablet Take 81 mg by mouth daily.    Marland Kitchen atenolol (TENORMIN) 25 MG tablet TAKE 1 TABLET BY MOUTH EVERY DAY 90 tablet 2  . Cholecalciferol (VITAMIN D3) 2000 UNITS TABS Take 2,000 Units by mouth daily.     Marland Kitchen denosumab (PROLIA) 60 MG/ML SOSY injection Inject 60 mg into the skin every 6 (six) months.    . hydrochlorothiazide (MICROZIDE) 12.5 MG capsule TAKE 1 CAPSULE BY MOUTH EVERY DAY 90 capsule 2  .  Iodoquinol-HC-Aloe Polysacch (ALCORTIN A) 1-2-1 % GEL Apply topically as needed (for rash).    Marland Kitchen letrozole (FEMARA) 2.5 MG tablet TAKE 1 TABLET BY MOUTH EVERY DAY 90 tablet 3  . lisinopril (ZESTRIL) 10 MG tablet Take 1 tablet (10 mg total) by mouth daily. 90 tablet 2  . polyethylene glycol (MIRALAX / GLYCOLAX) packet Take 17 g by mouth every other day.    . warfarin (COUMADIN) 7.5 MG tablet Take one tablet, 7.5mg ,  by mouth daily except take no tablet on Wednesdays or as directed by coumadin clinic. 70 tablet 1  . Wheat Dextrin (BENEFIBER PO) Take 2 each by mouth See admin  instructions. Mix 2 teaspoons and drink 3 times daily     No current facility-administered medications for this visit.    Allergies:   Clobetasol, Clobetasol propionate, Phenergan [promethazine hcl], Dextromethorphan, and Zithromax [azithromycin]    Social History:  The patient  reports that she has never smoked. She has never used smokeless tobacco. She reports that she does not drink alcohol and does not use drugs.   Family History:  The patient's family history includes Aortic aneurysm in her father; Breast cancer (age of onset: 21) in her niece; Breast cancer (age of onset: 75) in her daughter; Heart disease in her mother.    ROS:  Please see the history of present illness.   Otherwise, review of systems are positive for none.   All other systems are reviewed and negative.    PHYSICAL EXAM: VS:  BP (!) 182/92   Pulse 65   Ht 5' 2.75" (1.594 m)   Wt 136 lb (61.7 kg)   BMI 24.28 kg/m  , BMI Body mass index is 24.28 kg/m. GEN: Well nourished, well developed, in no acute distress  HEENT: normal  Neck: no JVD, carotid bruits, or masses Cardiac: RRR; no  rubs, or gallops,no edema .  2 out of 6 systolic murmur in the aortic area which is early to mid peaking Respiratory:  clear to auscultation bilaterally, normal work of breathing GI: soft, nontender, nondistended, + BS MS: no deformity or atrophy  Skin: warm and dry, no rash Neuro:  Strength and sensation are intact Psych: euthymic mood, full affect   EKG:  EKG is ordered today. The ekg ordered today demonstrates normal sinus rhythm with no significant ST or T wave changes.   Recent Labs: 01/04/2021: Hemoglobin 13.0; Platelets 184 01/06/2021: Magnesium 2.0; TSH 1.24 01/11/2021: ALT 38; BUN 23; Creatinine, Ser 0.96; Potassium 3.7; Sodium 136    Lipid Panel    Component Value Date/Time   CHOL 236 (H) 04/26/2020 1503   TRIG 80.0 04/26/2020 1503   HDL 73.10 04/26/2020 1503   CHOLHDL 3 04/26/2020 1503   VLDL 16.0 04/26/2020  1503   LDLCALC 147 (H) 04/26/2020 1503   LDLDIRECT 158.5 11/04/2012 1105      Wt Readings from Last 3 Encounters:  02/09/21 136 lb (61.7 kg)  01/11/21 140 lb 12.2 oz (63.9 kg)  01/06/21 140 lb (63.5 kg)        PAD Screen 02/09/2021  Previous PAD dx? No  Previous surgical procedure? No  Pain with walking? No  Feet/toe relief with dangling? No  Painful, non-healing ulcers? No  Extremities discolored? No      ASSESSMENT AND PLAN:  1.  Symptomatic PVCs: She had recent worsening of palpitations and was noted to have frequent PVCs on EKG.  These seem to have improved at least by today's physical exam and EKG.  Nonetheless given her symptoms of intermittent dizziness and palpitations, I am going to obtain a 2-week outpatient monitor.  Continue atenolol for now.  2.  Cardiac murmur suggestive of aortic stenosis: Given her dizziness, I am going to obtain an echocardiogram.  I suspect that she at least has mild to moderate aortic stenosis.  3.  Essential hypertension, blood pressure is elevated here but I reviewed home blood pressure readings which were mostly in the normal range.  Continue to monitor for now.    Disposition:   FU with me in 1 month  Signed,  Kathlyn Sacramento, MD  02/09/2021 3:06 PM    [facility]

## 2021-02-13 ENCOUNTER — Other Ambulatory Visit: Payer: Self-pay | Admitting: Internal Medicine

## 2021-02-14 ENCOUNTER — Telehealth: Payer: Self-pay | Admitting: Internal Medicine

## 2021-02-14 ENCOUNTER — Ambulatory Visit: Payer: Medicare Other

## 2021-02-14 ENCOUNTER — Telehealth: Payer: Medicare Other

## 2021-02-14 NOTE — Chronic Care Management (AMB) (Signed)
   02/14/2021  Becky Farrell 1934/03/16 [id number]  Referral received from primary care provider, [name], NP.  Telephone call to patient to engage for Chronic care management services. Patient states she would like to talk with her primary care provider prior to committing to Chronic care management services.   Patient request assistance with setting up follow up appointment with provider.  Contacted patients primary care provider office and spoke with [name].  Follow up appointment made for 02/21/21 at 11:15am. Returned call to patient to inform of scheduled follow up appointment. Unable to reach. HIPAA compliant voice message left with call back phone number.   Quinn Plowman RN,BSN,CCM RN Case Manager [place]  [phone]

## 2021-02-15 ENCOUNTER — Ambulatory Visit: Payer: Self-pay

## 2021-02-15 NOTE — Chronic Care Management (AMB) (Signed)
   02/15/2021  Arthur Aydelotte Krolikowski Apr 26, 1934 [id number]  Return call received from patient.  Patient informed of scheduled appointment with [name], NP for 02/21/2021 at 11:15am. Patient verbalized understanding and appreciation for assistance with arranging appointment.   Quinn Plowman RN,BSN,CCM RN Case Manager [place]  [phone]

## 2021-02-20 ENCOUNTER — Other Ambulatory Visit: Payer: Self-pay

## 2021-02-20 DIAGNOSIS — D802 Selective deficiency of immunoglobulin A [IgA]: Secondary | ICD-10-CM | POA: Diagnosis not present

## 2021-02-21 ENCOUNTER — Ambulatory Visit (INDEPENDENT_AMBULATORY_CARE_PROVIDER_SITE_OTHER): Payer: Medicare Other | Admitting: Internal Medicine

## 2021-02-21 ENCOUNTER — Ambulatory Visit: Payer: Self-pay

## 2021-02-21 ENCOUNTER — Encounter: Payer: Self-pay | Admitting: Internal Medicine

## 2021-02-21 VITALS — BP 152/94 | HR 66 | Temp 98.1°F | Wt 137.0 lb

## 2021-02-21 DIAGNOSIS — K5900 Constipation, unspecified: Secondary | ICD-10-CM | POA: Diagnosis not present

## 2021-02-21 DIAGNOSIS — I10 Essential (primary) hypertension: Secondary | ICD-10-CM

## 2021-02-21 DIAGNOSIS — Z7901 Long term (current) use of anticoagulants: Secondary | ICD-10-CM

## 2021-02-21 DIAGNOSIS — N816 Rectocele: Secondary | ICD-10-CM | POA: Insufficient documentation

## 2021-02-21 LAB — POCT INR: INR: 1.6 — AB (ref 2.0–3.0)

## 2021-02-21 MED ORDER — WARFARIN SODIUM 7.5 MG PO TABS: ORAL_TABLET | ORAL | 1 refills | Status: DC

## 2021-02-21 MED ORDER — WARFARIN SODIUM 5 MG PO TABS: ORAL_TABLET | ORAL | 1 refills | Status: DC

## 2021-02-21 NOTE — Patient Instructions (Signed)
Your Home Program  General Guidelines for Pelvic Floor Exercise  Challenge your muscles to do more than they are used to doing. The quality of the exercise is more important that the number you perform.  Avoid straining, holding your breath or using buttock or leg muscles while you exercise the pelvic floor muscles.  Count out loud and continue breathing to avoid straining.  Relax your body and breathe during your exercises.  Coordinate your breathing with your pelvic floor contraction by blowing out or exhaling while you contract your pelvic floor muscles.   Concentrate on activating both the sphincters and levator ani muscles of the pelvic floor with each exercise.  Position for the Exercises  Start lying down with your knees bent and supported with pillows.  Once you've gained awareness and can feel the contractions you may perform the exercises either sitting or standing.  For example, you can do them while driving, working on the computer, or waiting in lines.  Quick Contractions  Repeat this exercise 5 times.  Do the exercise 5 times per day.  Rapidly contract your pelvic floor muscles and hold for 2 seconds relax for 2 seconds.  Try to do the contraction on breathing exhalation.  Endurance Contractions  Repeat this 5 times.  Do the exercise 5 times per day.  Pull your pelvic floor muscles up and in and hold for 5 seconds then relax for 5 seconds.  Count out loud while you are holding the contraction to make sure that you are breathing throughout the exercise and not straining.     2007, [employer] Doc.37

## 2021-02-21 NOTE — Assessment & Plan Note (Signed)
Controlled on Lisinopril, HCTZ and Atenolol Reinforced DASH diet Will monitor

## 2021-02-21 NOTE — Addendum Note (Signed)
Addended by: Lurlean Nanny on: 02/21/2021 11:41 AM   Modules accepted: Orders

## 2021-02-21 NOTE — Assessment & Plan Note (Signed)
Continue Mirilax every other day, Benefiber daily Handout given for pelvic floor exercises

## 2021-02-21 NOTE — Patient Instructions (Addendum)
Pre visit review using our clinic review tool, if applicable. No additional management support is needed unless otherwise documented below in the visit note.  Contacted pt by phone and advised to increase dose today to 10 mg and take 5 mg tomorrow and then change weekly dose to take 5mg  daily, except take 7.5 mg on Mon and Thurs. Recheck in 2 wks. Pt verbalized understanding and did not want the AVS mailed to her home.

## 2021-02-21 NOTE — Progress Notes (Signed)
Sending in a refill of 5 mg coumadin per pt request.

## 2021-02-21 NOTE — Progress Notes (Signed)
Subjective:    Patient ID: Becky Farrell, female    DOB: 20-Oct-1934, 87 y.o.   MRN: [id number]  HPI   Patient presents to the clinic today to discuss the letter she got in the mail regarding my departure from  [place].  She reports she has been very satisfied with my care and does plan on following me to [place].   She would like to give me an update on her rectocele.  She reports she is having a small BM daily with the use of MiraLAX every other day and Benefiber daily.  She did have a pessary in the past and eventually underwent hysterectomy.  She is not having to splint to have a bowel movement.   She also reports a history of hypertension.  She reports her blood pressures at home run: 128/62, 132/70, 124/80. Her BP today is 152/94.  She is taking Lisinopril, HCTZ and atenolol as prescribed.  Review of Systems      Past Medical History:  Diagnosis Date  . Colon polyps   . Family history of breast cancer   . History of pulmonary embolism 2011  . Hx of melanoma of skin 2003   Right leg  . Hypertension   . Melanoma ([place]) 2003  . Personal history of radiation therapy   . Seizures ([place]) 1993   no meds since (approx) 2014  . Vertigo    x1. R/T perforated ear drum prior to 2013    Current Outpatient Medications  Medication Sig Dispense Refill  . aspirin 81 MG tablet Take 81 mg by mouth daily.    Marland Kitchen atenolol (TENORMIN) 25 MG tablet TAKE 1 TABLET BY MOUTH EVERY DAY 90 tablet 0  . Cholecalciferol (VITAMIN D3) 2000 UNITS TABS Take 2,000 Units by mouth daily.     Marland Kitchen denosumab (PROLIA) 60 MG/ML SOSY injection Inject 60 mg into the skin every 6 (six) months.    . hydrochlorothiazide (MICROZIDE) 12.5 MG capsule TAKE 1 CAPSULE BY MOUTH EVERY DAY 90 capsule 2  . Iodoquinol-HC-Aloe Polysacch (ALCORTIN A) 1-2-1 % GEL Apply topically as needed (for rash).    Marland Kitchen letrozole (FEMARA) 2.5 MG tablet TAKE 1 TABLET BY MOUTH EVERY DAY 90 tablet 3  . lisinopril (ZESTRIL) 10 MG tablet Take 1 tablet (10  mg total) by mouth daily. 90 tablet 2  . polyethylene glycol (MIRALAX / GLYCOLAX) packet Take 17 g by mouth every other day.    . warfarin (COUMADIN) 7.5 MG tablet Take one tablet, 7.5mg ,  by mouth daily except take no tablet on Wednesdays or as directed by coumadin clinic. 70 tablet 1  . Wheat Dextrin (BENEFIBER PO) Take 2 each by mouth See admin instructions. Mix 2 teaspoons and drink 3 times daily     No current facility-administered medications for this visit.    Allergies  Allergen Reactions  . Clobetasol Other (See Comments)    vertigo  . Clobetasol Propionate Other (See Comments)    This is ointment form UNSPECIFIED REACTION   . Phenergan [Promethazine Hcl] Other (See Comments)    Rapid heart rate  . Dextromethorphan Palpitations and Other (See Comments)    promethazine with DM, rapid hearbeat  . Zithromax [Azithromycin] Palpitations and Other (See Comments)    Per OSH report, pt unable to recall Rapid heart rate    Family History  Problem Relation Age of Onset  . Heart disease Mother   . Aortic aneurysm Father   . Breast cancer Daughter 29  . Breast  cancer Niece 10    Social History   Socioeconomic History  . Marital status: Married    Spouse name: Not on file  . Number of children: Not on file  . Years of education: Not on file  . Highest education level: Not on file  Occupational History  . Not on file  Tobacco Use  . Smoking status: Never Smoker  . Smokeless tobacco: Never Used  Vaping Use  . Vaping Use: Never used  Substance and Sexual Activity  . Alcohol use: No  . Drug use: Never  . Sexual activity: Never  Other Topics Concern  . Not on file  Social History Narrative   Recently moved from [place] to [facility].   Daughter and grandchildren live in [place].   Has other children in [place].   Retired Pharmacist, hospital.      Husband has been recovering from Prostate CA.   Social Determinants of Health   Financial Resource Strain: Not on file  Food  Insecurity: Not on file  Transportation Needs: Not on file  Physical Activity: Not on file  Stress: Not on file  Social Connections: Not on file  Intimate Partner Violence: Not on file     Constitutional: Denies fever, malaise, fatigue, headache or abrupt weight changes.  Respiratory: Denies difficulty breathing, shortness of breath, cough or sputum production.   Cardiovascular: Denies chest pain, chest tightness, palpitations or swelling in the hands or feet.  Gastrointestinal: Pt reports history of difficulty passing stool due to rectocele. Denies abdominal pain, bloating, constipation, diarrhea or blood in the stool.  Neurological: Denies dizziness, difficulty with memory, difficulty with speech or problems with balance and coordination.    No other specific complaints in a complete review of systems (except as listed in HPI above).  Objective:   Physical Exam  BP (!) 152/94   Pulse 66   Temp 98.1 F (36.7 C) (Temporal)   Wt 137 lb (62.1 kg)   SpO2 97%   BMI 24.46 kg/m   Wt Readings from Last 3 Encounters:  02/09/21 136 lb (61.7 kg)  01/11/21 140 lb 12.2 oz (63.9 kg)  01/06/21 140 lb (63.5 kg)    General: Appears her stated age, well developed, well nourished in NAD. Skin: Warm, dry and intact.  HEENT: Head: normal shape and size; Eyes: sclera white, no icterus, conjunctiva pink, PERRLA and EOMs intact;  Cardiovascular: Normal rate and rhythm.  Pulmonary/Chest: Normal effort and positive vesicular breath sounds.  Musculoskeletal: No difficulty with gait.  Neurological: Alert and oriented.  Psychiatric: Mood and affect normal. Behavior is normal. Judgment and thought content normal.    BMET    Component Value Date/Time   NA 136 01/11/2021 1331   NA 140 04/22/2013 0237   K 3.7 01/11/2021 1331   K 3.6 04/22/2013 0237   CL 100 01/11/2021 1331   CL 106 04/22/2013 0237   CO2 26 01/11/2021 1331   CO2 29 04/22/2013 0237   GLUCOSE 103 (H) 01/11/2021 1331   GLUCOSE  88 04/22/2013 0237   BUN 23 01/11/2021 1331   BUN 26 (H) 04/22/2013 0237   CREATININE 0.96 01/11/2021 1331   CREATININE 0.74 01/01/2014 1556   CALCIUM 9.4 01/11/2021 1331   CALCIUM 9.7 04/22/2013 0237   GFRNONAA 58 (L) 01/11/2021 1331   GFRNONAA >60 04/22/2013 0237   GFRAA >60 08/10/2020 1025   GFRAA >60 04/22/2013 0237    Lipid Panel     Component Value Date/Time   CHOL 236 (H) 04/26/2020  1503   TRIG 80.0 04/26/2020 1503   HDL 73.10 04/26/2020 1503   CHOLHDL 3 04/26/2020 1503   VLDL 16.0 04/26/2020 1503   LDLCALC 147 (H) 04/26/2020 1503    CBC    Component Value Date/Time   WBC 5.2 01/04/2021 1056   RBC 4.12 01/04/2021 1056   HGB 13.0 01/04/2021 1056   HGB 13.8 05/05/2014 1218   HCT 39.4 01/04/2021 1056   HCT 36.4 05/12/2014 0628   PLT 184 01/04/2021 1056   PLT 163 05/05/2014 1218   MCV 95.6 01/04/2021 1056   MCV 97 05/05/2014 1218   MCH 31.6 01/04/2021 1056   MCHC 33.0 01/04/2021 1056   RDW 13.9 01/04/2021 1056   RDW 13.6 05/05/2014 1218   LYMPHSABS 1.1 01/04/2021 1056   MONOABS 0.6 01/04/2021 1056   EOSABS 0.1 01/04/2021 1056   BASOSABS 0.0 01/04/2021 1056    Hgb A1C Lab Results  Component Value Date   HGBA1C 5.6 01/06/2021           Assessment & Plan:    [name], NP This visit occurred during the SARS-CoV-2 public health emergency.  Safety protocols were in place, including screening questions prior to the visit, additional usage of staff PPE, and extensive cleaning of exam room while observing appropriate contact time as indicated for disinfecting solutions.

## 2021-02-23 DIAGNOSIS — I493 Ventricular premature depolarization: Secondary | ICD-10-CM | POA: Diagnosis not present

## 2021-02-26 ENCOUNTER — Other Ambulatory Visit: Payer: Self-pay | Admitting: Internal Medicine

## 2021-02-28 DIAGNOSIS — I493 Ventricular premature depolarization: Secondary | ICD-10-CM | POA: Diagnosis not present

## 2021-03-01 ENCOUNTER — Other Ambulatory Visit: Payer: Self-pay | Admitting: Internal Medicine

## 2021-03-02 ENCOUNTER — Other Ambulatory Visit: Payer: Self-pay

## 2021-03-02 ENCOUNTER — Ambulatory Visit (INDEPENDENT_AMBULATORY_CARE_PROVIDER_SITE_OTHER): Payer: Medicare Other

## 2021-03-02 DIAGNOSIS — R011 Cardiac murmur, unspecified: Secondary | ICD-10-CM

## 2021-03-02 LAB — ECHOCARDIOGRAM COMPLETE
AR max vel: 1.49 cm2
AV Area VTI: 1.32 cm2
AV Area mean vel: 1.23 cm2
AV Mean grad: 8 mmHg
AV Peak grad: 14.1 mmHg
Ao pk vel: 1.88 m/s
Area-P 1/2: 3.85 cm2
MV M vel: 6.23 m/s
MV Peak grad: 155.3 mmHg
P 1/2 time: 477 msec
S' Lateral: 3.4 cm

## 2021-03-03 ENCOUNTER — Telehealth: Payer: Self-pay

## 2021-03-03 NOTE — Telephone Encounter (Signed)
-----   Message from Wellington Hampshire, MD sent at 03/02/2021  5:43 PM EDT ----- Inform patient that echo was fine.  normal ejection fraction with mild aortic stenosis as expected.

## 2021-03-03 NOTE — Telephone Encounter (Signed)
Called to give the patient echo results. lmtcb. 

## 2021-03-03 NOTE — Telephone Encounter (Signed)
Patient made aware of echo results with verbalized understanding. 

## 2021-03-06 ENCOUNTER — Ambulatory Visit: Payer: Medicare Other | Admitting: Radiation Oncology

## 2021-03-06 DIAGNOSIS — D802 Selective deficiency of immunoglobulin A [IgA]: Secondary | ICD-10-CM | POA: Diagnosis not present

## 2021-03-06 LAB — POCT INR: INR: 2.2 (ref 2.0–3.0)

## 2021-03-06 LAB — PROTIME-INR: INR: 2.2 — AB (ref 0.9–1.1)

## 2021-03-07 ENCOUNTER — Ambulatory Visit (INDEPENDENT_AMBULATORY_CARE_PROVIDER_SITE_OTHER): Payer: Medicare Other | Admitting: Dermatology

## 2021-03-07 ENCOUNTER — Other Ambulatory Visit: Payer: Self-pay

## 2021-03-07 ENCOUNTER — Encounter: Payer: Self-pay | Admitting: Dermatology

## 2021-03-07 DIAGNOSIS — D229 Melanocytic nevi, unspecified: Secondary | ICD-10-CM | POA: Diagnosis not present

## 2021-03-07 DIAGNOSIS — L821 Other seborrheic keratosis: Secondary | ICD-10-CM | POA: Diagnosis not present

## 2021-03-07 DIAGNOSIS — I8310 Varicose veins of unspecified lower extremity with inflammation: Secondary | ICD-10-CM

## 2021-03-07 DIAGNOSIS — B351 Tinea unguium: Secondary | ICD-10-CM | POA: Diagnosis not present

## 2021-03-07 DIAGNOSIS — L720 Epidermal cyst: Secondary | ICD-10-CM | POA: Diagnosis not present

## 2021-03-07 DIAGNOSIS — Z1283 Encounter for screening for malignant neoplasm of skin: Secondary | ICD-10-CM | POA: Diagnosis not present

## 2021-03-07 DIAGNOSIS — L304 Erythema intertrigo: Secondary | ICD-10-CM | POA: Diagnosis not present

## 2021-03-07 DIAGNOSIS — L578 Other skin changes due to chronic exposure to nonionizing radiation: Secondary | ICD-10-CM | POA: Diagnosis not present

## 2021-03-07 DIAGNOSIS — D692 Other nonthrombocytopenic purpura: Secondary | ICD-10-CM

## 2021-03-07 DIAGNOSIS — L814 Other melanin hyperpigmentation: Secondary | ICD-10-CM | POA: Diagnosis not present

## 2021-03-07 DIAGNOSIS — D1800 Hemangioma unspecified site: Secondary | ICD-10-CM

## 2021-03-07 DIAGNOSIS — Z85820 Personal history of malignant melanoma of skin: Secondary | ICD-10-CM

## 2021-03-07 DIAGNOSIS — L853 Xerosis cutis: Secondary | ICD-10-CM

## 2021-03-07 MED ORDER — ECONAZOLE NITRATE 1 % EX CREA: TOPICAL_CREAM | CUTANEOUS | 3 refills | Status: DC

## 2021-03-07 NOTE — Progress Notes (Signed)
Follow-Up Visit   Subjective  Becky Farrell is a 87 y.o. female who presents for the following: Annual Exam (Patient here for TBSE. She has a history of melanoma of the right lower leg treated in 2003. She has a history of rash between breasts off and on. In the past, she has treated with Alcortin-A, but Rx is not available now.).   The following portions of the chart were reviewed this encounter and updated as appropriate:       Review of Systems:  No other skin or systemic complaints except as noted in HPI or Assessment and Plan.  Objective  Well appearing patient in no apparent distress; mood and affect are within normal limits.  A full examination was performed including scalp, head, eyes, ears, nose, lips, neck, chest, axillae, abdomen, back, buttocks, bilateral upper extremities, bilateral lower extremities, hands, feet, fingers, toes, fingernails, and toenails. All findings within normal limits unless otherwise noted below.  Objective  Intermammary and Inframammary: Small pink papules  Objective  toenails: 10/10 toenails with thickening and yellow-white discoloration; scaling of the web spaces.  Objective  Right Infraocular: Firm white papule   Assessment & Plan   Skin cancer screening performed today.  Actinic Damage - chronic, secondary to cumulative UV radiation exposure/sun exposure over time - diffuse scaly erythematous macules with underlying dyspigmentation - Recommend daily broad spectrum sunscreen SPF 30+ to sun-exposed areas, reapply every 2 hours as needed.  - Recommend staying in the shade or wearing long sleeves, sun glasses (UVA+UVB protection) and wide brim hats (4-inch brim around the entire circumference of the hat). - Call for new or changing lesions.  Lentigines - Scattered tan macules - Due to sun exposure - Benign-appering, observe - Recommend daily broad spectrum sunscreen SPF 30+ to sun-exposed areas, reapply every 2 hours as needed. -  Call for any changes  Seborrheic Keratoses - Stuck-on, waxy, tan-brown papules and/or plaques  - Benign-appearing - Discussed benign etiology and prognosis. - Observe - Call for any changes  History of Melanoma - No evidence of recurrence today of the right lower leg - Recommend regular full body skin exams - Recommend daily broad spectrum sunscreen SPF 30+ to sun-exposed areas, reapply every 2 hours as needed.  - Call if any new or changing lesions are noted between office visits  Spider Veins - Dilated blue, purple or red veins at the lower extremities - Reassured - These can be treated by sclerotherapy (a procedure to inject a medicine into the veins to make them disappear) if desired, but the treatment is not covered by insurance  Hemangiomas - Red papules - Discussed benign nature - Observe - Call for any changes  Melanocytic Nevi - Tan-brown and/or pink-flesh-colored symmetric macules and papules - Benign appearing on exam today - Observation - Call clinic for new or changing moles - Recommend daily use of broad spectrum spf 30+ sunscreen to sun-exposed areas.   Xerosis - diffuse xerotic patches - recommend gentle, hydrating skin care - gentle skin care handout given  Purpura - Chronic; persistent and recurrent.  Treatable, but not curable. - Violaceous macules and patches - Benign - Related to trauma, age, sun damage and/or use of blood thinners, chronic use of topical and/or oral steroids - Observe - Can use OTC arnica containing moisturizer such as Dermend Bruise Formula if desired - Call for worsening or other concerns   Erythema intertrigo Intermammary and Inframammary  Start econazole nitrate 1% cream Apply to rash qd/bid until improved  dsp 85g 3Rf.  Recommend Zeasorb AF powder daily after shower.  Intertrigo is a chronic recurrent rash that occurs in skin fold areas that may be associated with friction; heat; moisture; yeast; fungus; and bacteria.   It is exacerbated by increased movement / activity; sweating; and higher atmospheric temperature.   econazole nitrate 1 % cream - Intermammary and Inframammary  Onychomycosis toenails  With Tinea Pedis  May use econazole cream qd/bid between toes and around toenails.  Discussed oral treatment. Will try topical first.  Epidermal inclusion cyst Right Infraocular  Benign-appearing. Exam most consistent with an epidermal inclusion cyst. Discussed that a cyst is a benign growth that can grow over time and sometimes get irritated or inflamed. Recommend observation if it is not bothersome. Discussed option of surgical excision to remove it if it is growing, symptomatic, or other changes noted. Please call for new or changing lesions so they can be evaluated.    Return in about 1 year (around 03/07/2022) for TBSE.   IJamesetta Orleans, CMA, am acting as scribe for Brendolyn Patty, MD .  Documentation: I have reviewed the above documentation for accuracy and completeness, and I agree with the above.  Brendolyn Patty MD

## 2021-03-07 NOTE — Patient Instructions (Addendum)
Zeasorb AF Powder (over the counter) - Apply under and between the breasts daily after shower.  Econazole Cream - Apply to rash between and under breasts 1-2 times a day until improved. May also apply between toes and around toenails 1-2 times a day until improved.   Seborrheic Keratosis  What causes seborrheic keratoses? Seborrheic keratoses are harmless, common skin growths that first appear during adult life.  As time goes by, more growths appear.  Some people may develop a large number of them.  Seborrheic keratoses appear on both covered and uncovered body parts.  They are not caused by sunlight.  The tendency to develop seborrheic keratoses can be inherited.  They vary in color from skin-colored to gray, brown, or even black.  They can be either smooth or have a rough, warty surface.   Seborrheic keratoses are superficial and look as if they were stuck on the skin.  Under the microscope this type of keratosis looks like layers upon layers of skin.  That is why at times the top layer may seem to fall off, but the rest of the growth remains and re-grows.    Treatment Seborrheic keratoses do not need to be treated, but can easily be removed in the office.  Seborrheic keratoses often cause symptoms when they rub on clothing or jewelry.  Lesions can be in the way of shaving.  If they become inflamed, they can cause itching, soreness, or burning.  Removal of a seborrheic keratosis can be accomplished by freezing, burning, or surgery. If any spot bleeds, scabs, or grows rapidly, please return to have it checked, as these can be an indication of a skin cancer.   Dry Skin Care  What causes dry skin?  Dry skin is common and results from inadequate moisture in the outer skin layers. Dry skin usually results from the excessive loss of moisture from the skin surface. This occurs due to two major factors: 1. Normally the skin's oil glands deposit a layer of oil on the skin's surface. This layer of oil  prevents the loss of moisture from the skin. Exposure to soaps, cleaners, solvents, and disinfectants removes this oily film, allowing water to escape. 2. Water loss from the skin increases when the humidity is low. During winter months we spend a lot of time indoors where the air is heated. Heated air has very low humidity. This also contributes to dry skin.  A tendency for dry skin may accompany such disorders as eczema. Also, as people age, the number of functioning oil glands decreases, and the tendency toward dry skin can be a sensation of skin tightness when emerging from the shower.  How do I manage dry skin?  1. Humidify your environment. This can be accomplished by using a humidifier in your bedroom at night during winter months. 2. Bathing can actually put moisture back into your skin if done right. Take the following steps while bathing to sooth dry skin:  Avoid hot water, which only dries the skin and makes itching worse. Use warm water.  Avoid washcloths or extensive rubbing or scrubbing.  Use mild soaps like unscented Dove, Oil of Olay, Cetaphil, Basis, or CeraVe.  If you take baths rather than showers, rinse off soap residue with clean water before getting out of tub.  Once out of the shower/tub, pat dry gently with a soft towel. Leave your skin damp.  While still damp, apply any medicated ointment/cream you were prescribed to the affected areas. After you apply your  medicated ointment/cream, then apply your moisturizer to your whole body.This is the most important step in dry skin care. If this is omitted, your skin will continue to be dry.  The choice of moisturizer is also very important. In general, lotion will not provider enough moisture to severely dry skin because it is water based. You should use an ointment or cream. Moisturizers should also be unscented. Good choices include Vaseline (plain petrolatum), Aquaphor, Cetaphil, CeraVe, Vanicream, DML Forte, Aveeno moisture,  or Eucerin Cream.  Bath oils can be helpful, but do not replace the application of moisturizer after the bath. In addition, they make the tub slippery causing an increased risk for falls. Therefore, we do not recommend their use.   If you have any questions or concerns for your doctor, please call our main line at [phone] and press option 4 to reach your doctor's medical assistant. If no one answers, please leave a voicemail as directed and we will return your call as soon as possible. Messages left after 4 pm will be answered the following business day.   You may also send [place] a message via [place]. We typically respond to MyChart messages within 1-2 business days.  For prescription refills, please ask your pharmacy to contact our office. Our fax number is [phone].  If you have an urgent issue when the clinic is closed that cannot wait until the next business day, you can page your doctor at the number below.    Please note that while we do our best to be available for urgent issues outside of office hours, we are not available 24/7.   If you have an urgent issue and are unable to reach [place], you may choose to seek medical care at your doctor's office, retail clinic, urgent care center, or emergency room.  If you have a medical emergency, please immediately call 911 or go to the emergency department.  Pager Numbers  - Dr. Nehemiah Massed: [phone]  - Dr. Laurence Ferrari: [phone]  - Dr. Nicole Kindred: [phone]  In the event of inclement weather, please call our main line at [phone] for an update on the status of any delays or closures.  Dermatology Medication Tips: Please keep the boxes that topical medications come in in order to help keep track of the instructions about where and how to use these. Pharmacies typically print the medication instructions only on the boxes and not directly on the medication tubes.   If your medication is too expensive, please contact our office at  [phone] option 4 or send [place] a message through [place].   We are unable to tell what your co-pay for medications will be in advance as this is different depending on your insurance coverage. However, we may be able to find a substitute medication at lower cost or fill out paperwork to get insurance to cover a needed medication.   If a prior authorization is required to get your medication covered by your insurance company, please allow [place] 1-2 business days to complete this process.  Drug prices often vary depending on where the prescription is filled and some pharmacies may offer cheaper prices.  The website www.goodrx.com contains coupons for medications through different pharmacies. The prices here do not account for what the cost may be with help from insurance (it may be cheaper with your insurance), but the website can give you the price if you did not use any insurance.  - You can print the associated coupon and take it with your prescription to  the pharmacy.  - You may also stop by our office during regular business hours and pick up a GoodRx coupon card.  - If you need your prescription sent electronically to a different pharmacy, notify our office through [facility] or by phone at [phone] option 4.

## 2021-03-08 ENCOUNTER — Ambulatory Visit (INDEPENDENT_AMBULATORY_CARE_PROVIDER_SITE_OTHER): Payer: Medicare Other

## 2021-03-08 DIAGNOSIS — Z7901 Long term (current) use of anticoagulants: Secondary | ICD-10-CM

## 2021-03-08 NOTE — Patient Instructions (Addendum)
Pre visit review using our clinic review tool, if applicable. No additional management support is needed unless otherwise documented below in the visit note.  Continue to take 5 mg daily EXCEPT take 7.5 mg on Mondays and Thursdays. Re-check in two weeks.

## 2021-03-09 ENCOUNTER — Telehealth: Payer: Self-pay

## 2021-03-09 DIAGNOSIS — H26493 Other secondary cataract, bilateral: Secondary | ICD-10-CM | POA: Diagnosis not present

## 2021-03-09 NOTE — Telephone Encounter (Signed)
-----   Message from Wellington Hampshire, MD sent at 03/09/2021  1:29 PM EDT ----- Inform patient that outpatient monitor showed frequent runs of SVT and some PVCs.  I recommend switching atenolol to metoprolol tartrate 25 mg twice daily.

## 2021-03-09 NOTE — Telephone Encounter (Signed)
Called to give the patient zio monitor results. lmtcb.

## 2021-03-10 ENCOUNTER — Other Ambulatory Visit: Payer: Self-pay

## 2021-03-10 ENCOUNTER — Encounter: Payer: Self-pay | Admitting: Family

## 2021-03-10 ENCOUNTER — Ambulatory Visit (INDEPENDENT_AMBULATORY_CARE_PROVIDER_SITE_OTHER): Payer: Medicare Other | Admitting: Family

## 2021-03-10 VITALS — BP 140/82 | HR 62 | Ht 62.0 in | Wt 137.0 lb

## 2021-03-10 DIAGNOSIS — I493 Ventricular premature depolarization: Secondary | ICD-10-CM | POA: Diagnosis not present

## 2021-03-10 DIAGNOSIS — I350 Nonrheumatic aortic (valve) stenosis: Secondary | ICD-10-CM | POA: Diagnosis not present

## 2021-03-10 DIAGNOSIS — I10 Essential (primary) hypertension: Secondary | ICD-10-CM

## 2021-03-10 DIAGNOSIS — I471 Supraventricular tachycardia: Secondary | ICD-10-CM

## 2021-03-10 MED ORDER — METOPROLOL TARTRATE 25 MG PO TABS: 25.0000 mg | ORAL_TABLET | Freq: Two times a day (BID) | ORAL | 1 refills | Status: DC

## 2021-03-10 NOTE — Patient Instructions (Addendum)
Medication Instructions:  Your physician has recommended you make the following change in your medication:   STOP Atenolol  START Metoprolol Tartrate 25mg  twice daily  *If you need a refill on your cardiac medications before your next appointment, please call your pharmacy*   Lab Work: None ordered today   Testing/Procedures: Your ultrasound of your heart showed mild stiffness of the aortic valve and good pumping function. To keep the aortic valve from getting more stiff, we keep your blood pressure well controlled.   Your heart monitor showed mostly normal sinus rhythm though you had early beats 2% of the time and some fast heart rates called SVT. To help better control this, we have switched Atenolol to Metoprolol.    Follow-Up: At [facility], you and your health needs are our priority.  As part of our continuing mission to provide you with exceptional heart care, we have created designated Provider Care Teams.  These Care Teams include your primary Cardiologist (physician) and Advanced Practice Providers (APPs -  Physician Assistants and Nurse Practitioners) who all work together to provide you with the care you need, when you need it.  We recommend signing up for the patient portal called "MyChart".  Sign up information is provided on this After Visit Summary.  MyChart is used to connect with patients for Virtual Visits (Telemedicine).  Patients are able to view lab/test results, encounter notes, upcoming appointments, etc.  Non-urgent messages can be sent to your provider as well.   To learn more about what you can do with MyChart, go to [email or web address].    Your next appointment:   3 month(s)  The format for your next appointment:   In Person  Provider:   You may see Kathlyn [place], MD or one of the following Advanced Practice Providers on your designated Care Team:    Murray Hodgkins, NP  Christell Faith, PA-C  Marrianne Mood, PA-C  Cadence [place],  [place]  Laurann [place], NP    Other Instructions To prevent palpitations: Make sure you are adequately hydrated.  Avoid and/or limit caffeine containing beverages like soda or tea. Exercise regularly.  Manage stress well.  Heart Healthy Diet Recommendations: A low-salt diet is recommended. Meats should be grilled, baked, or boiled. Avoid fried foods. Focus on lean protein sources like fish or chicken with vegetables and fruits. The American Heart Association is a Microbiologist!  American Heart Association Diet and Lifeystyle Recommendations   Exercise recommendations: The American Heart Association recommends 150 minutes of moderate intensity exercise weekly. Try 30 minutes of moderate intensity exercise 4-5 times per week. This could include walking, jogging, or swimming.

## 2021-03-10 NOTE — Progress Notes (Signed)
Office Visit    Patient Name: Becky Farrell Date of Encounter: 03/11/2021  PCP:  Jearld Fenton, NP   [place]  Cardiologist:  Kathlyn Sacramento, MD  Advanced Practice Provider:  No care team member to display Electrophysiologist:  None    Chief Complaint    Becky Farrell is a 87 y.o. female with a hx of PVC, SVT, palpitations, dizziness, PE on warfarin, osteoporosis, HTN, right breast cancer s/p lumpectomy and radiation, mild aortic stenosis presents today for follow up after cardiac testing.    Past Medical History    Past Medical History:  Diagnosis Date  . Colon polyps   . Family history of breast cancer   . History of pulmonary embolism 2011  . Hx of melanoma of skin 2003   Right leg  . Hypertension   . Melanoma ([place]) 2003   right lower leg. Treated in [place].  Marland Kitchen Personal history of radiation therapy   . Seizures ([place]) 1993   no meds since (approx) 2014  . Vertigo    x1. R/T perforated ear drum prior to 2013   Past Surgical History:  Procedure Laterality Date  . ABDOMINAL HYSTERECTOMY  05/2014  . BREAST BIOPSY Right 08/20/2018   invasive mammary carcinoma/DCIS   . BREAST LUMPECTOMY Right 10/10/2018  . BREAST LUMPECTOMY WITH RADIOACTIVE SEED LOCALIZATION Right 10/10/2018   Procedure: BREAST LUMPECTOMY WITH RADIOACTIVE SEED LOCALIZATION;  Surgeon: Jovita Kussmaul, MD;  Location: [facility];  Service: General;  Laterality: Right;  . CATARACT EXTRACTION W/PHACO Right 01/06/2018   Procedure: CATARACT EXTRACTION PHACO AND INTRAOCULAR LENS PLACEMENT ([place]) RIGHT;  Surgeon: Leandrew Koyanagi, MD;  Location: [facility];  Service: Ophthalmology;  Laterality: Right;  . CATARACT EXTRACTION W/PHACO Left 01/29/2018   Procedure: CATARACT EXTRACTION PHACO AND INTRAOCULAR LENS PLACEMENT ([place]) LEFT;  Surgeon: Leandrew Koyanagi, MD;  Location: [facility];  Service: Ophthalmology;  Laterality: Left;  . left hand surgery  05/2011   s/p fall  .  OTHER SURGICAL HISTORY  2003   Removal of lymphoma from melonoma 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     Allergies  Allergies  Allergen Reactions  . Clobetasol Other (See Comments)    vertigo  . Clobetasol Propionate Other (See Comments)    This is ointment form UNSPECIFIED REACTION   . Phenergan [Promethazine Hcl] Other (See Comments)    Rapid heart rate  . Dextromethorphan Palpitations and Other (See Comments)    promethazine with DM, rapid hearbeat  . Zithromax [Azithromycin] Palpitations and Other (See Comments)    Per OSH report, pt unable to recall Rapid heart rate    History of Present Illness    Becky Farrell is a 87 y.o. female with a hx of PVC, SVT, palpitations, dizziness, PE on warfarin, osteoporosis, HTN, right breast cancer s/p lumpectomy and radiation, mild aortic stenosis last seen 02/09/21. She and her husband reside in [place].   She follows with Dr. Fletcher Anon and at last clinic visit reported mild exertional dyspnea and PVC's. Murmur was noted on exam. Zio and echo were recommended.  Her ZIO monitor She had echo 03/02/21 with LVEF 50-55%, no RWMA, mildly elevated PASP, bilateral atria mildly dilated, mild MR, mild aortic stenosis. ZIO showed predominantly normal sinus rhythm with occasional PAC's (2.7% burden), PVC's (2.7% burden), and 35 runs of SVT (fastest 5 beats with 187bpm and longest 16.5 sec with rate 110bpm).   Presents today for follow up. Endorses feeling overall well with only  occasional palpitations. Reports no shortness of breath at rest and only mild dyspnea on exertion. Reports no chest pain, pressure, or tightness. No edema, orthopnea, PND. Reports no lightheadedness, dizziness, near syncope, syncope.    EKGs/Labs/Other Studies Reviewed:   The following studies were reviewed today:  Echo 03/02/21  1. Left ventricular ejection fraction, by estimation, is 50 to 55%. The  left ventricle has low normal function. The left ventricle has no regional  wall motion  abnormalities. Left ventricular diastolic parameters are  indeterminate.   2. Right ventricular systolic function is low normal. The right  ventricular size is normal. There is mildly elevated pulmonary artery  systolic pressure.   3. Left atrial size was mildly dilated.   4. Right atrial size was mildly dilated.   5. The mitral valve is degenerative. Mild mitral valve regurgitation.   6. The aortic valve is tricuspid. Aortic valve regurgitation is mild.  Mild aortic valve sclerosis is present, with no evidence of aortic valve  stenosis.   7. The inferior vena cava is normal in size with greater than 50%  respiratory variability, suggesting right atrial pressure of 3 mmHg.   Monitor 03/01/21 Patient had a min HR of 44 bpm, max HR of 187 bpm, and avg HR of 69 bpm.  Predominant underlying rhythm was Sinus Rhythm.  35 Supraventricular Tachycardia runs occurred, the run with the fastest interval lasting 5 beats with a max rate of 187 bpm, the longest lasting 16.5 secs with an avg rate of 110 bpm. Supraventricular Tachycardia was detected within +/- 45 seconds of symptomatic patient event(s). Occasional PACs with a burden of 2.7% and occasional PVCs with a burden of 2.1%.    EKG:  No EKG today  Recent Labs: 01/04/2021: Hemoglobin 13.0; Platelets 184 01/06/2021: Magnesium 2.0; TSH 1.24 01/11/2021: ALT 38; BUN 23; Creatinine, Ser 0.96; Potassium 3.7; Sodium 136  Recent Lipid Panel    Component Value Date/Time   CHOL 236 (H) 04/26/2020 1503   TRIG 80.0 04/26/2020 1503   HDL 73.10 04/26/2020 1503   CHOLHDL 3 04/26/2020 1503   VLDL 16.0 04/26/2020 1503   LDLCALC 147 (H) 04/26/2020 1503   LDLDIRECT 158.5 11/04/2012 1105   Home Medications   Current Meds  Medication Sig  . aspirin 81 MG tablet Take 81 mg by mouth daily.  . Cholecalciferol (VITAMIN D3) 2000 UNITS TABS Take 2,000 Units by mouth daily.   Marland Kitchen denosumab (PROLIA) 60 MG/ML SOSY injection Inject 60 mg into the skin every 6 (six)  months.  Marland Kitchen econazole nitrate 1 % cream Apply to rash under and between breasts 1-2 times a day until improved.  . hydrochlorothiazide (MICROZIDE) 12.5 MG capsule TAKE 1 CAPSULE BY MOUTH EVERY DAY  . letrozole (FEMARA) 2.5 MG tablet TAKE 1 TABLET BY MOUTH EVERY DAY  . lisinopril (ZESTRIL) 10 MG tablet TAKE 1 TABLET BY MOUTH EVERY DAY  . metoprolol tartrate (LOPRESSOR) 25 MG tablet Take 1 tablet (25 mg total) by mouth 2 (two) times daily.  . polyethylene glycol (MIRALAX / GLYCOLAX) packet Take 17 g by mouth every other day.  . warfarin (COUMADIN) 5 MG tablet TAKE ONE TABLET BY MOUTH DAILY EXCEPT TAKE 7.5MG  ON MONDAYS AND THURSDAYS OR AS DIRECTED BY COUMADIN CLINIC  . warfarin (COUMADIN) 7.5 MG tablet Take one tablet, 7.5mg ,  by mouth daily except take no tablet on Wednesdays or as directed by coumadin clinic.  . Wheat Dextrin (BENEFIBER PO) Take 2 each by mouth See admin instructions. Mix 2 teaspoons  and drink 3 times daily  . [DISCONTINUED] atenolol (TENORMIN) 25 MG tablet TAKE 1 TABLET BY MOUTH EVERY DAY     Review of Systems  All other systems reviewed and are otherwise negative except as noted above.  Physical Exam    VS:  BP 140/82   Pulse 62   Ht 5\' 2"  (1.575 m)   Wt 137 lb (62.1 kg)   SpO2 98%   BMI 25.06 kg/m  , BMI Body mass index is 25.06 kg/m.  Wt Readings from Last 3 Encounters:  03/10/21 137 lb (62.1 kg)  02/21/21 137 lb (62.1 kg)  02/09/21 136 lb (61.7 kg)   GEN: Well nourished, well developed, in no acute distress. HEENT: normal. Neck: Supple, no JVD, carotid bruits, or masses. Cardiac: RRR, Gr 2/6 systolic murmur, no  rubs, or gallops. No clubbing, cyanosis, edema.  Radials/DP/PT 2+ and equal bilaterally.  Respiratory:  Respirations regular and unlabored, clear to auscultation bilaterally. GI: Soft, nontender, nondistended. MS: No deformity or atrophy. Skin: Warm and dry, no rash. Neuro:  Strength and sensation are intact. Psych: Normal affect.  Assessment &  Plan    1. PVC / SVT - Noted by ZIO. Recommend transition from Atenolol to Metoprolol Tartrate 25mg  BID. Hopeful reducing burden of arrhythmia will improve her mild dyspnea on exertion.    2. Mild aortic stenosis - Noted by recent echo. Gr 2/6 murmur on exam. Continue optimal BP control and periodic echo for monitoring as clinically indicated.  3. Chronic anticoagulation - Due to history of PE. Denies bleeding complications.   4. HTN - Bp well controlled by home readings. Known white coat syndrome. Continue present antihypertensive regimen.   Disposition: Follow up in 3 month(s) with Dr. Fletcher Anon or APP   Signed, Loel Dubonnet, NP 03/11/2021, 6:15 PM [place]

## 2021-03-10 NOTE — Telephone Encounter (Signed)
Patient made aware of results at her 03/10/21 o/v.

## 2021-03-17 ENCOUNTER — Other Ambulatory Visit: Payer: Self-pay

## 2021-03-17 ENCOUNTER — Ambulatory Visit
Admission: RE | Admit: 2021-03-17 | Discharge: 2021-03-17 | Disposition: A | Discharge status: Home / Self Care | Payer: Medicare Other | Source: Ambulatory Visit | Attending: Radiation Oncology | Admitting: Radiation Oncology

## 2021-03-17 ENCOUNTER — Encounter: Payer: Self-pay | Admitting: Radiation Oncology

## 2021-03-17 VITALS — BP 159/94 | HR 74 | Resp 16 | Wt 134.7 lb

## 2021-03-17 DIAGNOSIS — I471 Supraventricular tachycardia: Secondary | ICD-10-CM | POA: Diagnosis not present

## 2021-03-17 DIAGNOSIS — Z79811 Long term (current) use of aromatase inhibitors: Secondary | ICD-10-CM | POA: Diagnosis not present

## 2021-03-17 DIAGNOSIS — C50211 Malignant neoplasm of upper-inner quadrant of right female breast: Secondary | ICD-10-CM | POA: Diagnosis not present

## 2021-03-17 DIAGNOSIS — Z923 Personal history of irradiation: Secondary | ICD-10-CM | POA: Diagnosis not present

## 2021-03-17 DIAGNOSIS — Z170 Estrogen receptor positive status [ER+]: Secondary | ICD-10-CM | POA: Insufficient documentation

## 2021-03-17 DIAGNOSIS — Z08 Encounter for follow-up examination after completed treatment for malignant neoplasm: Secondary | ICD-10-CM | POA: Diagnosis not present

## 2021-03-17 DIAGNOSIS — Z7901 Long term (current) use of anticoagulants: Secondary | ICD-10-CM | POA: Insufficient documentation

## 2021-03-17 NOTE — Progress Notes (Signed)
Radiation Oncology Follow up Note  Name: Becky Farrell   Date:   03/17/2021 MRN:  [id number] DOB: August 16, 1934    This 87 y.o. female presents to the clinic today for 2-year follow-up status post whole breast radiation to her right breast for stage I ER/PR positive base of mammary carcinoma.  REFERRING PROVIDER: Jearld Fenton, NP  HPI: Patient is an 87 year old female now out 2 years having completed whole breast radiation to her right breast for stage I ER/PR positive invasive mammary carcinoma.  From a breast standpoint she is doing well specifically denies breast tenderness cough or bone pain.  She has multiple comorbidities has been dealing with some dizziness does have PVCs SVT palpitations.  She is currently on warfarin.  She is currently on letrozole tolerant well without side effect.  She had mammograms back in September which I have reviewed were BI-RADS 2 benign.  COMPLICATIONS OF TREATMENT: none  FOLLOW UP COMPLIANCE: keeps appointments   PHYSICAL EXAM:  BP (!) 159/94 (BP Location: Left Arm, Patient Position: Sitting)   Pulse 74   Resp 16   Wt 134 lb 11.2 oz (61.1 kg)   BMI 24.64 kg/m  Lungs are clear to A&P cardiac examination essentially unremarkable with regular rate and rhythm. No dominant mass or nodularity is noted in either breast in 2 positions examined. Incision is well-healed. No axillary or supraclavicular adenopathy is appreciated. Cosmetic result is excellent.  Well-developed well-nourished patient in NAD. HEENT reveals PERLA, EOMI, discs not visualized.  Oral cavity is clear. No oral mucosal lesions are identified. Neck is clear without evidence of cervical or supraclavicular adenopathy. Lungs are clear to A&P. Cardiac examination is essentially unremarkable with regular rate and rhythm without murmur rub or thrill. Abdomen is benign with no organomegaly or masses noted. Motor sensory and DTR levels are equal and symmetric in the upper and lower extremities. Cranial  nerves II through XII are grossly intact. Proprioception is intact. No peripheral adenopathy or edema is identified. No motor or sensory levels are noted. Crude visual fields are within normal range.  RADIOLOGY RESULTS: Mammograms reviewed compatible with above-stated findings  PLAN: Present time patient is doing well with no evidence of disease.  I am pleased with her overall progress.  She continues on letrozole without side effect.  She is under continuing monitoring for her cardiac problems.  I have asked to see her back in 1 year for follow-up.  She will have mammograms performed again in September.  Patient knows to call with any concerns.  I would like to take this opportunity to thank you for allowing me to participate in the care of your patient.Noreene Filbert, MD

## 2021-03-22 ENCOUNTER — Telehealth: Payer: Self-pay

## 2021-03-22 ENCOUNTER — Other Ambulatory Visit: Payer: Self-pay | Admitting: Hematology and Oncology

## 2021-03-22 DIAGNOSIS — Z1231 Encounter for screening mammogram for malignant neoplasm of breast: Secondary | ICD-10-CM

## 2021-03-22 NOTE — Telephone Encounter (Signed)
Copied from [place] [id number]. Topic: General - Call Back - No Documentation >> Mar 22, 2021  4:39 PM Erick Blinks wrote: Reason for CRM: Nathen May RN calling from [facility] is calling to request to speak to the nurse of PCP, "Pt concerned regarding the miralax that she is taking and the effect that it has on her"  Best contact: [phone]

## 2021-03-23 ENCOUNTER — Telehealth: Payer: Self-pay

## 2021-03-23 ENCOUNTER — Telehealth: Payer: Medicare Other

## 2021-03-23 DIAGNOSIS — Z86718 Personal history of other venous thrombosis and embolism: Secondary | ICD-10-CM | POA: Diagnosis not present

## 2021-03-23 DIAGNOSIS — Z7901 Long term (current) use of anticoagulants: Secondary | ICD-10-CM | POA: Diagnosis not present

## 2021-03-23 DIAGNOSIS — Z86711 Personal history of pulmonary embolism: Secondary | ICD-10-CM | POA: Diagnosis not present

## 2021-03-23 LAB — POCT INR: INR: 2.2 (ref 2.0–3.0)

## 2021-03-23 NOTE — Telephone Encounter (Signed)
  Chronic Care Management   Outreach Note  03/23/2021 Name: ORTHA METTS MRN: [id number] DOB: Feb 24, 1934  Referred by: Jearld Fenton, NP Reason for referral : Care Coordination  Telephone call to patients primary care provider office for follow up.  RNCM spoke with Marjory Lies who states [name], NP has requested that patient schedule a follow up appointment to see her. RNCM unable to assist patient with scheduling appointment due to the requirement of answering COVID questions prior to scheduling.  Telephone call to patient.  Contact answering call states patient is not at home. HIPAA compliant message left with return phone number.   Follow Up Plan: Will attempt 2nd telephone outreach to patient within 3 business days.   Quinn Plowman RN,BSN,CCM RN Case Manager [place]  [phone]

## 2021-03-23 NOTE — Telephone Encounter (Signed)
This is not appropriate to discuss with the CMA. She needs to set up a visit to discuss with one of the providers.

## 2021-03-23 NOTE — Telephone Encounter (Signed)
I attempted to contact the patient to notify her that she need an appt to address her concerns.

## 2021-03-23 NOTE — Progress Notes (Signed)
This encounter was created in error - please disregard.

## 2021-03-23 NOTE — Telephone Encounter (Signed)
  Chronic Care Management   Outreach Note  03/23/2021  Name: IDA MILBRATH MRN: [id number] DOB: 1934/06/30  Referred by: Jearld Fenton, NP Reason for referral : Care Coordination  Late entry for 03/22/2021 Successful return call was made with patient.  HIPAA verified.  Patient states she will continue to have [name], NP as her primary care provider.  Ms. Garnette Gunner has transition to a new office practice, [facility] in [place], [place]. Patient states she is having occasional loose stools and increase pressure in her bowel/ vaginal area.  She reports taking Miralax every other day. RNCM advised patient to contact her doctor's office to notify of symptoms. Patient request RNCM contact doctors office for her to explain her symptoms.    Patient was previously offered case management and care coordination services. Patient declined at the time.  Patient has now transitioned to new physician practice.  RNCM contacted [facility] and left message with office staff member, Dominque explaining patients concerns and  requesting return call from nurse.  Follow Up Plan:RNCM will await return call from provider office. If no response will attempt 2nd outreach to provider office within 2 business days.   Quinn Plowman RN,BSN,CCM RN Case Manager [place]  [phone]

## 2021-03-24 ENCOUNTER — Ambulatory Visit (INDEPENDENT_AMBULATORY_CARE_PROVIDER_SITE_OTHER): Payer: Medicare Other

## 2021-03-24 DIAGNOSIS — Z7901 Long term (current) use of anticoagulants: Secondary | ICD-10-CM | POA: Diagnosis not present

## 2021-03-24 NOTE — Patient Instructions (Signed)
Pre visit review using our clinic review tool, if applicable. No additional management support is needed unless otherwise documented below in the visit note. 

## 2021-03-29 ENCOUNTER — Telehealth: Payer: Self-pay

## 2021-03-29 NOTE — Telephone Encounter (Signed)
  Chronic Care Management   Outreach Note  03/29/2021 Name: Becky Farrell MRN: [id number] DOB: [birth year]  Referred by: Jearld Fenton, NP Reason for referral : Care Coordination (Return call from patient)   Received return call from patient. Informed patient that primary care provider [name] request patient schedule follow up appointment with her regarding her symptom concerns at [facility]. Patient verbalizes understanding.  Advised patient to notify her primary care provider if interested in case management services in the future.    Follow Up Plan: No further follow up needed.   Quinn Plowman RN,BSN,CCM RN Case Manager [place]  [phone]

## 2021-03-30 ENCOUNTER — Ambulatory Visit (INDEPENDENT_AMBULATORY_CARE_PROVIDER_SITE_OTHER): Payer: Medicare Other | Admitting: Podiatry

## 2021-03-30 ENCOUNTER — Encounter: Payer: Self-pay | Admitting: Podiatry

## 2021-03-30 ENCOUNTER — Other Ambulatory Visit: Payer: Self-pay

## 2021-03-30 ENCOUNTER — Telehealth: Payer: Self-pay

## 2021-03-30 DIAGNOSIS — D689 Coagulation defect, unspecified: Secondary | ICD-10-CM | POA: Diagnosis not present

## 2021-03-30 DIAGNOSIS — M79676 Pain in unspecified toe(s): Secondary | ICD-10-CM

## 2021-03-30 DIAGNOSIS — B351 Tinea unguium: Secondary | ICD-10-CM | POA: Diagnosis not present

## 2021-03-30 DIAGNOSIS — M79609 Pain in unspecified limb: Secondary | ICD-10-CM

## 2021-03-30 NOTE — Telephone Encounter (Signed)
Copied from [place] [id number]. Topic: General - Other >> Mar 30, 2021  2:03 PM [place], Brayton Layman A wrote: Reason for CRM: Pt requested a call back from [facility], pt stated [name] put her on Miralax a few years ago and she had some questions she wanted answered before she goes on vacation on 5/3.

## 2021-03-30 NOTE — Telephone Encounter (Signed)
Is she following me to [facility] or staying at [facility]? Please find out what questions she has.

## 2021-03-30 NOTE — Progress Notes (Signed)
This patient returns to my office for at risk foot care.  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  This patient is unable to cut nails herself since the patient cannot reach her nails.These nails are painful walking and wearing shoes.  This patient presents for at risk foot care today.  General Appearance  Alert, conversant and in no acute stress.  Vascular  Dorsalis pedis and posterior tibial  pulses are palpable  bilaterally.  Capillary return is within normal limits  bilaterally. Temperature is within normal limits  bilaterally.  Neurologic  Senn-Weinstein monofilament wire test within normal limits  bilaterally. Muscle power within normal limits bilaterally.  Nails Thick disfigured discolored nails with subungual debris  from hallux to fifth toes bilaterally. No evidence of bacterial infection or drainage bilaterally.  Orthopedic  No limitations of motion  feet .  No crepitus or effusions noted.  No bony pathology or digital deformities noted.  Skin  normotropic skin with no porokeratosis noted bilaterally.  No signs of infections or ulcers noted.     Onychomycosis  Pain in right toes  Pain in left toes  Consent was obtained for treatment procedures.   Mechanical debridement of nails 1-5  bilaterally performed with a nail nipper.  Filed with dremel without incident.    Return office visit    3 months                  Told patient to return for periodic foot care and evaluation due to potential at risk complications.   Gardiner Barefoot DPM

## 2021-04-03 ENCOUNTER — Other Ambulatory Visit: Payer: Self-pay

## 2021-04-03 ENCOUNTER — Ambulatory Visit (INDEPENDENT_AMBULATORY_CARE_PROVIDER_SITE_OTHER): Payer: Medicare Other | Admitting: Dermatology

## 2021-04-03 ENCOUNTER — Ambulatory Visit: Payer: Medicare Other | Admitting: Podiatry

## 2021-04-03 DIAGNOSIS — Z86718 Personal history of other venous thrombosis and embolism: Secondary | ICD-10-CM | POA: Diagnosis not present

## 2021-04-03 DIAGNOSIS — L304 Erythema intertrigo: Secondary | ICD-10-CM

## 2021-04-03 DIAGNOSIS — Z86711 Personal history of pulmonary embolism: Secondary | ICD-10-CM | POA: Diagnosis not present

## 2021-04-03 LAB — POCT INR: INR: 2.7 (ref 2.0–3.0)

## 2021-04-03 MED ORDER — KETOCONAZOLE 2 % EX CREA: TOPICAL_CREAM | CUTANEOUS | 2 refills | Status: DC

## 2021-04-03 NOTE — Patient Instructions (Signed)
Intertrigo is a chronic recurrent rash that occurs in skin fold areas that may be associated with friction; heat; moisture; yeast; fungus; and bacteria.  It is exacerbated by increased movement / activity; sweating; and higher atmospheric temperature.  If you have any questions or concerns for your doctor, please call our main line at [phone] and press option 4 to reach your doctor's medical assistant. If no one answers, please leave a voicemail as directed and we will return your call as soon as possible. Messages left after 4 pm will be answered the following business day.   You may also send [place] a message via [facility]. We typically respond to MyChart messages within 1-2 business days.  For prescription refills, please ask your pharmacy to contact our office. Our fax number is [phone].  If you have an urgent issue when the clinic is closed that cannot wait until the next business day, you can page your doctor at the number below.    Please note that while we do our best to be available for urgent issues outside of office hours, we are not available 24/7.   If you have an urgent issue and are unable to reach [place], you may choose to seek medical care at your doctor's office, retail clinic, urgent care center, or emergency room.  If you have a medical emergency, please immediately call 911 or go to the emergency department.  Pager Numbers  - Dr. Nehemiah Massed: [phone]  - Dr. Laurence Ferrari: [phone]  - Dr. Nicole Kindred: [phone]  In the event of inclement weather, please call our main line at [phone] for an update on the status of any delays or closures.  Dermatology Medication Tips: Please keep the boxes that topical medications come in in order to help keep track of the instructions about where and how to use these. Pharmacies typically print the medication instructions only on the boxes and not directly on the medication tubes.   If your medication is too expensive, please contact  our office at [phone] option 4 or send [place] a message through [place].   We are unable to tell what your co-pay for medications will be in advance as this is different depending on your insurance coverage. However, we may be able to find a substitute medication at lower cost or fill out paperwork to get insurance to cover a needed medication.   If a prior authorization is required to get your medication covered by your insurance company, please allow [place] 1-2 business days to complete this process.  Drug prices often vary depending on where the prescription is filled and some pharmacies may offer cheaper prices.  The website www.goodrx.com contains coupons for medications through different pharmacies. The prices here do not account for what the cost may be with help from insurance (it may be cheaper with your insurance), but the website can give you the price if you did not use any insurance.  - You can print the associated coupon and take it with your prescription to the pharmacy.  - You may also stop by our office during regular business hours and pick up a GoodRx coupon card.  - If you need your prescription sent electronically to a different pharmacy, notify our office through [facility] or by phone at [phone] option 4.

## 2021-04-03 NOTE — Progress Notes (Signed)
   Follow-Up Visit   Subjective  Becky Farrell is a 87 y.o. female who presents for the following: Skin Problem (Patient here for a reaction on her chest to econazole nitrate cream. Area is not itchy or irritated, but is very red. The last time she used cream was 1 week ago, using the cream only twice. She has used Alcortin in the past.).  It worked faster.   The following portions of the chart were reviewed this encounter and updated as appropriate:       Review of Systems:  No other skin or systemic complaints except as noted in HPI or Assessment and Plan.  Objective  Well appearing patient in no apparent distress; mood and affect are within normal limits.  A focused examination was performed including face, inframmary. Relevant physical exam findings are noted in the Assessment and Plan.  Objective  Left Inframammary Fold: Bright erythematous patch with annular border on left inframammary fold.   Assessment & Plan  Erythema intertrigo Left Inframammary Fold  Intertrigo is a chronic recurrent rash that occurs in skin fold areas that may be associated with friction; heat; moisture; yeast; fungus; and bacteria.  It is exacerbated by increased movement / activity; sweating; and higher atmospheric temperature.  Advised patient rash is intertrigo that has not been fully treated. No evidence of allergic reaction to econazole nitrate cream.   Patient would like to try a different Rx cream and will start when she gets back from trip [facility]).  Start ketoconazole 2% cream Apply bid AA rash until improved dsp 60g 2Rf.  ketoconazole (NIZORAL) 2 % cream - Left Inframammary Fold  Other Related Medications econazole nitrate 1 % cream  Return in about 15 days (around 04/18/2021) for f/u rash.   IJamesetta Orleans, CMA, am acting as scribe for Brendolyn Patty, MD .  Documentation: I have reviewed the above documentation for accuracy and completeness, and I agree with the  above.  Brendolyn Patty MD

## 2021-04-04 ENCOUNTER — Ambulatory Visit (INDEPENDENT_AMBULATORY_CARE_PROVIDER_SITE_OTHER): Payer: Medicare Other | Admitting: Internal Medicine

## 2021-04-04 ENCOUNTER — Encounter: Payer: Self-pay | Admitting: Internal Medicine

## 2021-04-04 ENCOUNTER — Other Ambulatory Visit: Payer: Self-pay

## 2021-04-04 ENCOUNTER — Ambulatory Visit (INDEPENDENT_AMBULATORY_CARE_PROVIDER_SITE_OTHER): Payer: Medicare Other

## 2021-04-04 DIAGNOSIS — Z7901 Long term (current) use of anticoagulants: Secondary | ICD-10-CM

## 2021-04-04 DIAGNOSIS — R194 Change in bowel habit: Secondary | ICD-10-CM | POA: Diagnosis not present

## 2021-04-04 NOTE — Progress Notes (Signed)
Virtual Visit via Telephone Note  I connected with Becky Farrell on 04/04/21 at  2:20 PM EDT by telephone and verified that I am speaking with the correct person using two identifiers.  Location: Patient: Home Provider: Office  Persons participating in this video call: [name], NP and Ignatius Specking.   I discussed the limitations, risks, security and privacy concerns of performing an evaluation and management service by telephone and the availability of in person appointments. I also discussed with the patient that there may be a patient responsible charge related to this service. The patient expressed understanding and agreed to proceed.   History of Present Illness:  Patient wanting to discuss changes in her bowel habits.  She reports intermittent constipation, gassiness and loose stools.  She denies nausea, vomiting, abdominal plain or blood in her stool.  She reports sometimes the gassiness will lead to liquid stool in her briefs but she denies full-blown stool incontinence.  She is taking MiraLAX every other day and Benefiber daily.  She is about to go on a road trip to the [place] and is concerned about her bowel habits and controlling her bowels during this time.  Past Medical History:  Diagnosis Date  . Colon polyps   . Family history of breast cancer   . History of pulmonary embolism 2011  . Hx of melanoma of skin 2003   Right leg  . Hypertension   . Melanoma ([place]) 2003   right lower leg. Treated in [place].  Marland Kitchen Personal history of radiation therapy   . Seizures ([place]) 1993   no meds since (approx) 2014  . Vertigo    x1. R/T perforated ear drum prior to 2013    Current Outpatient Medications  Medication Sig Dispense Refill  . aspirin 81 MG tablet Take 81 mg by mouth daily.    . Cholecalciferol (VITAMIN D3) 2000 UNITS TABS Take 2,000 Units by mouth daily.     Marland Kitchen denosumab (PROLIA) 60 MG/ML SOSY injection Inject 60 mg into the skin every 6 (six) months.    Marland Kitchen  econazole nitrate 1 % cream Apply to rash under and between breasts 1-2 times a day until improved. 85 g 3  . hydrochlorothiazide (MICROZIDE) 12.5 MG capsule TAKE 1 CAPSULE BY MOUTH EVERY DAY 90 capsule 1  . ketoconazole (NIZORAL) 2 % cream Apply to rash under breasts 2 times a day until rash improved. 60 g 2  . letrozole (FEMARA) 2.5 MG tablet TAKE 1 TABLET BY MOUTH EVERY DAY 90 tablet 3  . lisinopril (ZESTRIL) 10 MG tablet TAKE 1 TABLET BY MOUTH EVERY DAY 90 tablet 0  . metoprolol tartrate (LOPRESSOR) 25 MG tablet Take 1 tablet (25 mg total) by mouth 2 (two) times daily. 180 tablet 1  . polyethylene glycol (MIRALAX / GLYCOLAX) packet Take 17 g by mouth every other day.    . warfarin (COUMADIN) 5 MG tablet TAKE ONE TABLET BY MOUTH DAILY EXCEPT TAKE 7.5MG  ON MONDAYS AND THURSDAYS OR AS DIRECTED BY COUMADIN CLINIC 105 tablet 1  . warfarin (COUMADIN) 7.5 MG tablet Take one tablet, 7.5mg ,  by mouth daily except take no tablet on Wednesdays or as directed by coumadin clinic. 70 tablet 1  . Wheat Dextrin (BENEFIBER PO) Take 2 each by mouth See admin instructions. Mix 2 teaspoons and drink 3 times daily     No current facility-administered medications for this visit.    Allergies  Allergen Reactions  . Clobetasol Other (See Comments)  vertigo  . Clobetasol Propionate Other (See Comments)    This is ointment form UNSPECIFIED REACTION   . Phenergan [Promethazine Hcl] Other (See Comments)    Rapid heart rate  . Dextromethorphan Palpitations and Other (See Comments)    promethazine with DM, rapid hearbeat  . Zithromax [Azithromycin] Palpitations and Other (See Comments)    Per OSH report, pt unable to recall Rapid heart rate    Family History  Problem Relation Age of Onset  . Heart disease Mother   . Aortic aneurysm Father   . Breast cancer Daughter 45  . Breast cancer Niece 79    Social History   Socioeconomic History  . Marital status: Married    Spouse name: Not on file  .  Number of children: Not on file  . Years of education: Not on file  . Highest education level: Not on file  Occupational History  . Not on file  Tobacco Use  . Smoking status: Never Smoker  . Smokeless tobacco: Never Used  Vaping Use  . Vaping Use: Never used  Substance and Sexual Activity  . Alcohol use: No  . Drug use: Never  . Sexual activity: Never  Other Topics Concern  . Not on file  Social History Narrative   Recently moved from [place] to [facility].   Daughter and grandchildren live in [place].   Has other children in [place].   Retired Pharmacist, hospital.      Husband has been recovering from Prostate CA.   Social Determinants of Health   Financial Resource Strain: Not on file  Food Insecurity: Not on file  Transportation Needs: Not on file  Physical Activity: Not on file  Stress: Not on file  Social Connections: Not on file  Intimate Partner Violence: Not on file     Constitutional: Denies fever, malaise, fatigue, headache or abrupt weight changes.  Respiratory: Denies difficulty breathing, shortness of breath, cough or sputum production.   Cardiovascular: Denies chest pain, chest tightness, palpitations or swelling in the hands or feet.  Gastrointestinal: Patient reports gassiness, constipation and diarrhea.  Denies abdominal pain, bloating, or blood in the stool.  Musculoskeletal: Denies low back pain.   No other specific complaints in a complete review of systems (except as listed in HPI above).    Observations/Objective:   Wt Readings from Last 3 Encounters:  03/17/21 134 lb 11.2 oz (61.1 kg)  03/10/21 137 lb (62.1 kg)  02/21/21 137 lb (62.1 kg)    General: In NAD. Neurological: Alert and oriented.    BMET    Component Value Date/Time   NA 136 01/11/2021 1331   NA 140 04/22/2013 0237   K 3.7 01/11/2021 1331   K 3.6 04/22/2013 0237   CL 100 01/11/2021 1331   CL 106 04/22/2013 0237   CO2 26 01/11/2021 1331   CO2 29 04/22/2013 0237   GLUCOSE 103  (H) 01/11/2021 1331   GLUCOSE 88 04/22/2013 0237   BUN 23 01/11/2021 1331   BUN 26 (H) 04/22/2013 0237   CREATININE 0.96 01/11/2021 1331   CREATININE 0.74 01/01/2014 1556   CALCIUM 9.4 01/11/2021 1331   CALCIUM 9.7 04/22/2013 0237   GFRNONAA 58 (L) 01/11/2021 1331   GFRNONAA >60 04/22/2013 0237   GFRAA >60 08/10/2020 1025   GFRAA >60 04/22/2013 0237    Lipid Panel     Component Value Date/Time   CHOL 236 (H) 04/26/2020 1503   TRIG 80.0 04/26/2020 1503   HDL 73.10 04/26/2020 1503  CHOLHDL 3 04/26/2020 1503   VLDL 16.0 04/26/2020 1503   LDLCALC 147 (H) 04/26/2020 1503    CBC    Component Value Date/Time   WBC 5.2 01/04/2021 1056   RBC 4.12 01/04/2021 1056   HGB 13.0 01/04/2021 1056   HGB 13.8 05/05/2014 1218   HCT 39.4 01/04/2021 1056   HCT 36.4 05/12/2014 0628   PLT 184 01/04/2021 1056   PLT 163 05/05/2014 1218   MCV 95.6 01/04/2021 1056   MCV 97 05/05/2014 1218   MCH 31.6 01/04/2021 1056   MCHC 33.0 01/04/2021 1056   RDW 13.9 01/04/2021 1056   RDW 13.6 05/05/2014 1218   LYMPHSABS 1.1 01/04/2021 1056   MONOABS 0.6 01/04/2021 1056   EOSABS 0.1 01/04/2021 1056   BASOSABS 0.0 01/04/2021 1056    Hgb A1C Lab Results  Component Value Date   HGBA1C 5.6 01/06/2021       Assessment and Plan:  Changes in Bowel Habits:  No concern for neurogenic bowel We will have her continue MiraLAX every other day We will have her change Benefiber to every other day on the days she is not taking MiraLAX  Advised her to schedule her Medicare wellness exam at the end of May  Follow Up Instructions:    I discussed the assessment and treatment plan with the patient. The patient was provided an opportunity to ask questions and all were answered. The patient agreed with the plan and demonstrated an understanding of the instructions.   The patient was advised to call back or seek an in-person evaluation if the symptoms worsen or if the condition fails to improve as  anticipated.  I provided 5:30 minutes of non-face-to-face time during this encounter.   [name], NP

## 2021-04-04 NOTE — Patient Instructions (Signed)
Pre visit review using our clinic review tool, if applicable. No additional management support is needed unless otherwise documented below in the visit note.  Continue to take 5 mg daily EXCEPT take 7.5 mg on Mondays and Thursdays. Re-check in two weeks.  

## 2021-04-04 NOTE — Patient Instructions (Signed)
Diet for Irritable Bowel Syndrome When you have irritable bowel syndrome (IBS), it is very important to eat the foods and follow the eating habits that are best for your condition. IBS may cause various symptoms such as pain in the abdomen, constipation, or diarrhea. Choosing the right foods can help to ease the discomfort from these symptoms. Work with your health care provider and diet and nutrition specialist (dietitian) to find the eating plan that will help to control your symptoms. What are tips for following this plan?  Keep a food diary. This will help you identify foods that cause symptoms. Write down: ? What you eat and when you eat it. ? What symptoms you have. ? When symptoms occur in relation to your meals, such as "pain in abdomen 2 hours after dinner."  Eat your meals slowly and in a relaxed setting.  Aim to eat 5-6 small meals per day. Do not skip meals.  Drink enough fluid to keep your urine pale yellow.  Ask your health care provider if you should take an over-the-counter probiotic to help restore healthy bacteria in your gut (digestive tract). ? Probiotics are foods that contain good bacteria and yeasts.  Your dietitian may have specific dietary recommendations for you based on your symptoms. He or she may recommend that you: ? Avoid foods that cause symptoms. Talk with your dietitian about other ways to get the same nutrients that are in those problem foods. ? Avoid foods with gluten. Gluten is a protein that is found in rye, wheat, and barley. ? Eat more foods that contain soluble fiber. Examples of foods with high soluble fiber include oats, seeds, and certain fruits and vegetables. Take a fiber supplement if directed by your dietitian. ? Reduce or avoid certain foods called FODMAPs. These are foods that contain carbohydrates that are hard to digest. Ask your doctor which foods contain these carbohydrates.      What foods are not recommended? The following are some  foods and drinks that may make your symptoms worse:  Fatty foods, such as french fries.  Foods that contain gluten, such as pasta and cereal.  Dairy products, such as milk, cheese, and ice cream.  Chocolate.  Alcohol.  Products with caffeine, such as coffee.  Carbonated drinks, such as soda.  Foods that are high in FODMAPs. These include certain fruits and vegetables.  Products with sweeteners such as honey, high fructose corn syrup, sorbitol, and mannitol. The items listed above may not be a complete list of foods and beverages you should avoid. Contact a dietitian for more information.   What foods are good sources of fiber? Your health care provider or dietitian may recommend that you eat more foods that contain fiber. Fiber can help to reduce constipation and other IBS symptoms. Add foods with fiber to your diet a little at a time so your body can get used to them. Too much fiber at one time might cause gas and swelling of your abdomen. The following are some foods that are good sources of fiber:  Berries, such as raspberries, strawberries, and blueberries.  Tomatoes.  Carrots.  Brown rice.  Oats.  Seeds, such as chia and pumpkin seeds. The items listed above may not be a complete list of recommended sources of fiber. Contact your dietitian for more options. Where to find more information  International Foundation for Functional Gastrointestinal Disorders: www.iffgd.[employer] of Diabetes and Digestive and Kidney Diseases: [employer] Summary  When you have irritable bowel syndrome (  IBS), it is very important to eat the foods and follow the eating habits that are best for your condition.  IBS may cause various symptoms such as pain in the abdomen, constipation, or diarrhea.  Choosing the right foods can help to ease the discomfort that comes from symptoms.  Keep a food diary. This will help you identify foods that cause symptoms.  Your health  care provider or diet and nutrition specialist (dietitian) may recommend that you eat more foods that contain fiber. This information is not intended to replace advice given to you by your health care provider. Make sure you discuss any questions you have with your health care provider. Document Revised: 07/21/2020 Document Reviewed: 07/21/2020 Elsevier Patient Education  2021 [employer].

## 2021-04-05 NOTE — Progress Notes (Signed)
noted 

## 2021-04-05 NOTE — Telephone Encounter (Signed)
error 

## 2021-04-14 DIAGNOSIS — H26492 Other secondary cataract, left eye: Secondary | ICD-10-CM | POA: Diagnosis not present

## 2021-04-17 DIAGNOSIS — Z86711 Personal history of pulmonary embolism: Secondary | ICD-10-CM | POA: Diagnosis not present

## 2021-04-17 DIAGNOSIS — Z86718 Personal history of other venous thrombosis and embolism: Secondary | ICD-10-CM | POA: Diagnosis not present

## 2021-04-17 LAB — PROTIME-INR: INR: 2.3 — AB (ref 0.9–1.1)

## 2021-04-17 LAB — POCT INR: INR: 2.3 (ref 2.0–3.0)

## 2021-04-18 ENCOUNTER — Other Ambulatory Visit: Payer: Self-pay

## 2021-04-18 ENCOUNTER — Ambulatory Visit (INDEPENDENT_AMBULATORY_CARE_PROVIDER_SITE_OTHER): Payer: Medicare Other | Admitting: Dermatology

## 2021-04-18 ENCOUNTER — Ambulatory Visit (INDEPENDENT_AMBULATORY_CARE_PROVIDER_SITE_OTHER): Payer: Medicare Other

## 2021-04-18 DIAGNOSIS — L304 Erythema intertrigo: Secondary | ICD-10-CM | POA: Diagnosis not present

## 2021-04-18 DIAGNOSIS — Z7901 Long term (current) use of anticoagulants: Secondary | ICD-10-CM | POA: Diagnosis not present

## 2021-04-18 NOTE — Progress Notes (Signed)
   Follow-Up Visit   Subjective  Becky Farrell is a 87 y.o. female who presents for the following: Follow-up (Patient here for 2 week follow-up intertrigo of the inframammary. She hasn't used any prescription creams since last visit, but states that the rash has improved. She has econazole nitrate cream at home, but has not picked up ketoconazole 2% cream yet. ).  She feels that she had a allergic reaction to the econazole last time she used it.   The following portions of the chart were reviewed this encounter and updated as appropriate:       Review of Systems:  No other skin or systemic complaints except as noted in HPI or Assessment and Plan.  Objective  Well appearing patient in no apparent distress; mood and affect are within normal limits.  A focused examination was performed including face, inframammary. Relevant physical exam findings are noted in the Assessment and Plan.  Objective  Inframammary: Mild erythema of the left inframammary.   Assessment & Plan  Erythema intertrigo Inframammary  Intertrigo is a chronic recurrent rash that occurs in skin fold areas that may be associated with friction; heat; moisture; yeast; fungus; and bacteria.  It is exacerbated by increased movement / activity; sweating; and higher atmospheric temperature.  Improving.   Patient will put econazole nitrate aside for now.  Start ketoconazole 2% cream qd/bid as needed for flares. Pt has Rx at the pharmacy.  Other Related Medications ketoconazole (NIZORAL) 2 % cream  Return as scheduled.   IJamesetta Orleans, CMA, am acting as scribe for Brendolyn Patty, MD .  Documentation: I have reviewed the above documentation for accuracy and completeness, and I agree with the above.  Brendolyn Patty MD

## 2021-04-18 NOTE — Patient Instructions (Addendum)
Pre visit review using our clinic review tool, if applicable. No additional management support is needed unless otherwise documented below in the visit note.  Continue to take 5 mg daily EXCEPT take 7.5 mg on Mondays and Thursdays. Re-check in four weeks per patient request.

## 2021-04-18 NOTE — Patient Instructions (Addendum)
Intertrigo is a chronic recurrent rash that occurs in skin fold areas that may be associated with friction; heat; moisture; yeast; fungus; and bacteria.  It is exacerbated by increased movement / activity; sweating; and higher atmospheric temperature.  Ketoconazole 2% Cream - Apply under breasts once a day, increasing to twice a day with flares.  If you have any questions or concerns for your doctor, please call our main line at [phone] and press option 4 to reach your doctor's medical assistant. If no one answers, please leave a voicemail as directed and we will return your call as soon as possible. Messages left after 4 pm will be answered the following business day.   You may also send [place] a message via [place]. We typically respond to MyChart messages within 1-2 business days.  For prescription refills, please ask your pharmacy to contact our office. Our fax number is [phone].  If you have an urgent issue when the clinic is closed that cannot wait until the next business day, you can page your doctor at the number below.    Please note that while we do our best to be available for urgent issues outside of office hours, we are not available 24/7.   If you have an urgent issue and are unable to reach [place], you may choose to seek medical care at your doctor's office, retail clinic, urgent care center, or emergency room.  If you have a medical emergency, please immediately call 911 or go to the emergency department.  Pager Numbers  - Dr. Nehemiah Massed: [phone]  - Dr. Laurence Ferrari: [phone]  - Dr. Nicole Kindred: [phone]  In the event of inclement weather, please call our main line at [phone] for an update on the status of any delays or closures.  Dermatology Medication Tips: Please keep the boxes that topical medications come in in order to help keep track of the instructions about where and how to use these. Pharmacies typically print the medication instructions only on the boxes  and not directly on the medication tubes.   If your medication is too expensive, please contact our office at [phone] option 4 or send [place] a message through [place].   We are unable to tell what your co-pay for medications will be in advance as this is different depending on your insurance coverage. However, we may be able to find a substitute medication at lower cost or fill out paperwork to get insurance to cover a needed medication.   If a prior authorization is required to get your medication covered by your insurance company, please allow [place] 1-2 business days to complete this process.  Drug prices often vary depending on where the prescription is filled and some pharmacies may offer cheaper prices.  The website www.goodrx.com contains coupons for medications through different pharmacies. The prices here do not account for what the cost may be with help from insurance (it may be cheaper with your insurance), but the website can give you the price if you did not use any insurance.  - You can print the associated coupon and take it with your prescription to the pharmacy.  - You may also stop by our office during regular business hours and pick up a GoodRx coupon card.  - If you need your prescription sent electronically to a different pharmacy, notify our office through [facility] or by phone at [phone] option 4.

## 2021-05-04 DIAGNOSIS — H26491 Other secondary cataract, right eye: Secondary | ICD-10-CM | POA: Diagnosis not present

## 2021-05-08 ENCOUNTER — Other Ambulatory Visit: Payer: Self-pay

## 2021-05-08 ENCOUNTER — Ambulatory Visit (INDEPENDENT_AMBULATORY_CARE_PROVIDER_SITE_OTHER): Payer: Medicare Other | Admitting: Internal Medicine

## 2021-05-08 ENCOUNTER — Encounter: Payer: Self-pay | Admitting: Internal Medicine

## 2021-05-08 VITALS — BP 126/68 | HR 70 | Temp 97.1°F | Resp 17 | Ht 62.0 in | Wt 134.8 lb

## 2021-05-08 DIAGNOSIS — Z86718 Personal history of other venous thrombosis and embolism: Secondary | ICD-10-CM

## 2021-05-08 DIAGNOSIS — M810 Age-related osteoporosis without current pathological fracture: Secondary | ICD-10-CM | POA: Diagnosis not present

## 2021-05-08 DIAGNOSIS — G459 Transient cerebral ischemic attack, unspecified: Secondary | ICD-10-CM | POA: Diagnosis not present

## 2021-05-08 DIAGNOSIS — Z23 Encounter for immunization: Secondary | ICD-10-CM

## 2021-05-08 DIAGNOSIS — Z170 Estrogen receptor positive status [ER+]: Secondary | ICD-10-CM | POA: Diagnosis not present

## 2021-05-08 DIAGNOSIS — C50211 Malignant neoplasm of upper-inner quadrant of right female breast: Secondary | ICD-10-CM

## 2021-05-08 DIAGNOSIS — Z86711 Personal history of pulmonary embolism: Secondary | ICD-10-CM | POA: Diagnosis not present

## 2021-05-08 DIAGNOSIS — Z0000 Encounter for general adult medical examination without abnormal findings: Secondary | ICD-10-CM | POA: Diagnosis not present

## 2021-05-08 DIAGNOSIS — R569 Unspecified convulsions: Secondary | ICD-10-CM | POA: Diagnosis not present

## 2021-05-08 DIAGNOSIS — I10 Essential (primary) hypertension: Secondary | ICD-10-CM

## 2021-05-08 NOTE — Patient Instructions (Signed)

## 2021-05-08 NOTE — Assessment & Plan Note (Signed)
Continue Prolia Calcium reviewed

## 2021-05-08 NOTE — Assessment & Plan Note (Signed)
Managed on Coumadin She will continue to follow with the coumadin clinic

## 2021-05-08 NOTE — Assessment & Plan Note (Signed)
In remission Continue Letrozole She will continue to follow with oncology

## 2021-05-08 NOTE — Assessment & Plan Note (Signed)
Controlled on Atenolol, HCTZ and Lisinopril Reinforced DASH diet Kidney function reviewed

## 2021-05-08 NOTE — Addendum Note (Signed)
Addended by: Wilson Singer on: 05/08/2021 02:39 PM   Modules accepted: Orders

## 2021-05-08 NOTE — Assessment & Plan Note (Signed)
Continue Atenolol, HCTZ, Lisinopril and Aspirin Not on statin given age

## 2021-05-08 NOTE — Assessment & Plan Note (Signed)
None off seizure meds Will monitor

## 2021-05-08 NOTE — Progress Notes (Signed)
HPI:  Pt presents to the clinic today for her subsequent annual Medicare Wellness Exam. She is also due to follow up chronic conditoins.  Hx of Right Breast Cancer: In remission, s/p lumpectomy and radiation seed implantation. She is taking Letrozole as prescribed. She follsow wit honcology. Mammogram from 08/2019 reviewed.  Hx of TIA: No residual effect. She is taking Atenolol, HCTZ, Lisinopril and ASA as prescribed. She does not follow with neurology.  Seizures: Remote history, none recently. She is not currently taking any antiseizure medication. She does not follow with neurology.  Hx of DVT/PE: On lifelong Coumadin. She follows with the [facility].  Osteoporosis: Managed on Prolia injections. Bone density from 07/2018 reviewed.  HTN: Her BP today is 126/68. She is taking Atenolol, HCTZ and Lisinopril as prescribed. ECG from 09/2018 reviewed.  Past Medical History:  Diagnosis Date  . Colon polyps   . Family history of breast cancer   . History of pulmonary embolism 2011  . Hx of melanoma of skin 2003   Right leg  . Hypertension   . Melanoma ([place]) 2003   right lower leg. [place]ated in [place].  Marland Kitchen Personal history of radiation therapy   . Seizures ([place]) 1993   no meds since (approx) 2014  . Vertigo    x1. R/T perforated ear drum prior to 2013    Current Outpatient Medications  Medication Sig Dispense Refill  . aspirin 81 MG tablet Take 81 mg by mouth daily.    . Cholecalciferol (VITAMIN D3) 2000 UNITS TABS Take 2,000 Units by mouth daily.     Marland Kitchen denosumab (PROLIA) 60 MG/ML SOSY injection Inject 60 mg into the skin every 6 (six) months.    . hydrochlorothiazide (MICROZIDE) 12.5 MG capsule TAKE 1 CAPSULE BY MOUTH EVERY DAY 90 capsule 1  . ketoconazole (NIZORAL) 2 % cream Apply to rash under breasts 2 times a day until rash improved. 60 g 2  . letrozole (FEMARA) 2.5 MG tablet TAKE 1 TABLET BY MOUTH EVERY DAY 90 tablet 3  . lisinopril (ZESTRIL) 10 MG tablet TAKE 1 TABLET BY MOUTH  EVERY DAY 90 tablet 0  . metoprolol tartrate (LOPRESSOR) 25 MG tablet Take 1 tablet (25 mg total) by mouth 2 (two) times daily. 180 tablet 1  . polyethylene glycol (MIRALAX / GLYCOLAX) packet Take 17 g by mouth every other day.    . warfarin (COUMADIN) 5 MG tablet TAKE ONE TABLET BY MOUTH DAILY EXCEPT TAKE 7.5MG  ON MONDAYS AND THURSDAYS OR AS DIRECTED BY COUMADIN CLINIC 105 tablet 1  . warfarin (COUMADIN) 7.5 MG tablet Take one tablet, 7.5mg ,  by mouth daily except take no tablet on Wednesdays or as directed by coumadin clinic. 70 tablet 1  . Wheat Dextrin (BENEFIBER PO) Take 2 each by mouth See admin instructions. Mix 2 teaspoons and drink 3 times daily     No current facility-administered medications for this visit.    Allergies  Allergen Reactions  . Clobetasol Other (See Comments)    vertigo  . Clobetasol Propionate Other (See Comments)    This is ointment form UNSPECIFIED REACTION   . Phenergan [Promethazine Hcl] Other (See Comments)    Rapid heart rate  . Dextromethorphan Palpitations and Other (See Comments)    promethazine with DM, rapid hearbeat  . Zithromax [Azithromycin] Palpitations and Other (See Comments)    Per OSH report, pt unable to recall Rapid heart rate    Family History  Problem Relation Age of Onset  . Heart disease Mother   .  Aortic aneurysm Father   . Breast cancer Daughter 35  . Breast cancer Niece 15    Social History   Socioeconomic History  . Marital status: Married    Spouse name: Not on file  . Number of children: Not on file  . Years of education: Not on file  . Highest education level: Not on file  Occupational History  . Not on file  Tobacco Use  . Smoking status: Never Smoker  . Smokeless tobacco: Never Used  Vaping Use  . Vaping Use: Never used  Substance and Sexual Activity  . Alcohol use: No  . Drug use: Never  . Sexual activity: Never  Other Topics Concern  . Not on file  Social History Narrative   Recently moved from [place]  to [facility].   Daughter and grandchildren live in [place].   Has other children in [place].   Retired Pharmacist, hospital.      Husband has been recovering from Prostate CA.   Social Determinants of Health   Financial Resource Strain: Not on file  Food Insecurity: Not on file  Transportation Needs: Not on file  Physical Activity: Not on file  Stress: Not on file  Social Connections: Not on file  Intimate Partner Violence: Not on file    Hospitiliaztions: None                Flu: 08/2020             Tetanus: 05/2011             Pneumovax: 12/2000             Prevnar: 12/2016             Covid: Moderna x 2             Zostavax: 09/2012             Shingrix: never             Mammogram: 08/2020             Pap Smear: no longer screening             Bone Density: 08/2020             Colon Screening: no longer screening             Eye Doctor: annually             Dental Exam: annually              Providers:               PCP: [name], NP             Oncologist: Dr. Gerrit Friends             Podiatry: Dr. Prudence Davidson     I have personally reviewed and have noted:  1. The patient's medical and social history 2. Their use of alcohol, tobacco or illicit drugs 3. Their current medications and supplements 4. The patient's functional ability including ADL's, fall risks, home safety risks and hearing or visual impairment. 5. Diet and physical activities 6. Evidence for depression or mood disorder  Subjective:   Review of Systems:   Constitutional: Pt reports fatigue. Denies fever, malaise, headache or abrupt weight changes.  HEENT: Denies eye pain, eye redness, ear pain, ringing in the ears, wax buildup, runny nose, nasal congestion, bloody nose, or sore throat. Respiratory: Denies difficulty breathing, shortness of breath, cough or sputum production.  Cardiovascular: Denies chest pain, chest tightness, palpitations or swelling in the hands or feet.  Gastrointestinal: Denies  abdominal pain, bloating, constipation, diarrhea or blood in the stool.  GU: Denies urgency, frequency, pain with urination, burning sensation, blood in urine, odor or discharge. Musculoskeletal: Denies decrease in range of motion, difficulty with gait, muscle pain or joint pain and swelling.  Skin: Pt reports blister to top of left foot. Denies redness, rashes, or ulcercations.  Neurological: Denies dizziness, difficulty with memory, difficulty with speech or problems with balance and coordination.  Psych: Denies anxiety, depression, SI/HI.  No other specific complaints in a complete review of systems (except as listed in HPI above).  Objective:  PE:  BP 126/68 (BP Location: Right Arm, Patient Position: Sitting, Cuff Size: Normal)   Pulse 70   Temp (!) 97.1 F (36.2 C) (Temporal)   Resp 17   Ht 5\' 2"  (1.575 m)   Wt 134 lb 12.8 oz (61.1 kg)   SpO2 99%   BMI 24.66 kg/m   Wt Readings from Last 3 Encounters:  03/17/21 134 lb 11.2 oz (61.1 kg)  03/10/21 137 lb (62.1 kg)  02/21/21 137 lb (62.1 kg)    General: Appears her stated age, well developed, well nourished in NAD. Skin: Warm, dry and intact. Blister noted to dorsal aspect left foot. HEENT: Head: normal shape and size; Eyes: sclera white and EOMs intact, stye noted of right upper eyelid;  Neck: Neck supple, trachea midline. No masses, lumps or thyromegaly present.  Cardiovascular: Normal rate and rhythm. S1,S2 noted.  No murmur, rubs or gallops noted. No JVD or BLE edema. No carotid bruits noted. Pulmonary/Chest: Normal effort and positive vesicular breath sounds. No respiratory distress. No wheezes, rales or ronchi noted.  Abdomen: Soft and nontender. Normal bowel sounds. No distention or masses noted. Liver, spleen and kidneys non palpable. Musculoskeletal:  Strength 5/5 BUE/BLE. No difficulty with gait. Neurological: Alert and oriented. Cranial nerves II-XII grossly intact. Coordination normal.  Psychiatric: Mood and affect  normal. Behavior is normal. Judgment and thought content normal.     BMET    Component Value Date/Time   NA 136 01/11/2021 1331   NA 140 04/22/2013 0237   K 3.7 01/11/2021 1331   K 3.6 04/22/2013 0237   CL 100 01/11/2021 1331   CL 106 04/22/2013 0237   CO2 26 01/11/2021 1331   CO2 29 04/22/2013 0237   GLUCOSE 103 (H) 01/11/2021 1331   GLUCOSE 88 04/22/2013 0237   BUN 23 01/11/2021 1331   BUN 26 (H) 04/22/2013 0237   CREATININE 0.96 01/11/2021 1331   CREATININE 0.74 01/01/2014 1556   CALCIUM 9.4 01/11/2021 1331   CALCIUM 9.7 04/22/2013 0237   GFRNONAA 58 (L) 01/11/2021 1331   GFRNONAA >60 04/22/2013 0237   GFRAA >60 08/10/2020 1025   GFRAA >60 04/22/2013 0237    Lipid Panel     Component Value Date/Time   CHOL 236 (H) 04/26/2020 1503   TRIG 80.0 04/26/2020 1503   HDL 73.10 04/26/2020 1503   CHOLHDL 3 04/26/2020 1503   VLDL 16.0 04/26/2020 1503   LDLCALC 147 (H) 04/26/2020 1503    CBC    Component Value Date/Time   WBC 5.2 01/04/2021 1056   RBC 4.12 01/04/2021 1056   HGB 13.0 01/04/2021 1056   HGB 13.8 05/05/2014 1218   HCT 39.4 01/04/2021 1056   HCT 36.4 05/12/2014 0628   PLT 184 01/04/2021 1056   PLT 163 05/05/2014 1218   MCV 95.6 01/04/2021  1056   MCV 97 05/05/2014 1218   MCH 31.6 01/04/2021 1056   MCHC 33.0 01/04/2021 1056   RDW 13.9 01/04/2021 1056   RDW 13.6 05/05/2014 1218   LYMPHSABS 1.1 01/04/2021 1056   MONOABS 0.6 01/04/2021 1056   EOSABS 0.1 01/04/2021 1056   BASOSABS 0.0 01/04/2021 1056    Hgb A1C Lab Results  Component Value Date   HGBA1C 5.6 01/06/2021      Assessment and Plan:   Medicare Annual Wellness Visit:  Diet: She does eat meat. She consumes fruits and veggies daily. She tries to avoid fried foods. She drinks mostly coffee and water. Physical activity: Walking Depression/mood screen: Negative, PHQ 9 score of 0 Hearing: Intact to whispered voice Visual acuity: Grossly normal, performs annual eye exam  ADLs:  Capable Fall risk: None Home safety: Good Cognitive evaluation: Intact to orientation, naming, recall and repetition EOL planning: Adv directives, DNR/ I agree   Preventative Medicine: Encouraged her to get a flu shot in the fall. Tetanus today. Pneumovax, prevnar, covid and zostovax UTD. She declines shingrix. Mammogram and bone density UTD. She no longer wants to screen for cervical or colon cancer. Encouraged her to consume a balanced diet and exercise regimen. Advised her to see an eye doctor and dentist annually. Labs reviewed. Due dates for screening exams given to pt as part of her AVS.   Next appointment: 1 year, Medicare Wellness Exam   [name], NP This visit occurred during the SARS-CoV-2 public health emergency.  Safety protocols were in place, including screening questions prior to the visit, additional usage of staff PPE, and extensive cleaning of exam room while observing appropriate contact time as indicated for disinfecting solutions.

## 2021-05-15 ENCOUNTER — Ambulatory Visit (INDEPENDENT_AMBULATORY_CARE_PROVIDER_SITE_OTHER): Payer: Medicare Other

## 2021-05-15 DIAGNOSIS — Z7901 Long term (current) use of anticoagulants: Secondary | ICD-10-CM | POA: Diagnosis not present

## 2021-05-15 DIAGNOSIS — Z86711 Personal history of pulmonary embolism: Secondary | ICD-10-CM | POA: Diagnosis not present

## 2021-05-15 DIAGNOSIS — Z86718 Personal history of other venous thrombosis and embolism: Secondary | ICD-10-CM | POA: Diagnosis not present

## 2021-05-15 LAB — POCT INR: INR: 2.3 (ref 2.0–3.0)

## 2021-05-15 NOTE — Patient Instructions (Addendum)
Pre visit review using our clinic review tool, if applicable. No additional management support is needed unless otherwise documented below in the visit note.  Continue to take 5 mg daily EXCEPT take 7.5 mg on Mondays and Thursdays. Re-check in four weeks, 06/12/21.

## 2021-05-16 ENCOUNTER — Other Ambulatory Visit: Payer: Self-pay | Admitting: Internal Medicine

## 2021-05-21 ENCOUNTER — Other Ambulatory Visit: Payer: Self-pay | Admitting: Internal Medicine

## 2021-05-21 NOTE — Telephone Encounter (Signed)
Last OV addressing issue was 05/08/21. Filled for 6 months. Requested Prescriptions  Pending Prescriptions Disp Refills  . lisinopril (ZESTRIL) 10 MG tablet [Pharmacy Med Name: LISINOPRIL 10 MG TABLET] 90 tablet 1    Sig: TAKE 1 TABLET BY MOUTH EVERY DAY     Cardiovascular:  ACE Inhibitors Failed - 05/21/2021  1:51 PM      Failed - Valid encounter within last 6 months    Recent Outpatient Visits          1 month ago Change in bowel habits   [facility], Coralie Keens, NP      Future Appointments            In 2 weeks Furth, Cadence H, PA-C [place], [facility]           Passed - Cr in normal range and within 180 days    Creat  Date Value Ref Range Status  01/01/2014 0.74 0.50 - 1.10 mg/dL Final   Creatinine, Ser  Date Value Ref Range Status  01/11/2021 0.96 0.44 - 1.00 mg/dL Final         Passed - K in normal range and within 180 days    Potassium  Date Value Ref Range Status  01/11/2021 3.7 3.5 - 5.1 mmol/L Final  04/22/2013 3.6 3.5 - 5.1 mmol/L Final         Passed - Patient is not pregnant      Passed - Last BP in normal range    BP Readings from Last 1 Encounters:  05/08/21 126/68

## 2021-06-02 ENCOUNTER — Other Ambulatory Visit: Payer: Self-pay | Admitting: Internal Medicine

## 2021-06-02 NOTE — Telephone Encounter (Signed)
   Notes to clinic: medication requested not on list has been d/c  Requested Prescriptions  Pending Prescriptions Disp Refills   atenolol (TENORMIN) 25 MG tablet [Pharmacy Med Name: ATENOLOL 25 MG TABLET] 90 tablet 0    Sig: TAKE 1 TABLET BY MOUTH EVERY DAY      There is no refill protocol information for this order

## 2021-06-08 NOTE — Progress Notes (Signed)
Cardiology Office Note:    Date:  06/09/2021   ID:  Becky Farrell, DOB 05-16-1934, MRN [id number]  PCP:  Jearld Fenton, NP  [facility] Cardiologist:  Kathlyn Sacramento, MD  [facility] Electrophysiologist:  None   Referring MD: Jearld Fenton, NP   Chief Complaint: 3 month follow-up  History of Present Illness:    Becky Farrell is a 87 y.o. female with a hx of PVC, SVT, palpitations, dizziness, PE on warfarin, osteoporosis, hypertension, right breast cancer status postlumpectomy and radiation, mild aortic stenosis presents for follow-up.  Her and her husband reside at [facility].  Seen by Dr. Cindee Lame 02/01/2021 reported mild exertional dyspnea and PVCs.  Murmur was noted on exam.  ZIO and echo were recommended.  Her ZIO showed dominantly normal sinus rhythm with occasional PACs with a 2.7 burden, PVCs with a 2.7 burden, 35 runs of SVT fastest 5 beats with 187 bpm and longest 16.5 seconds with a rate of 110 bpm.  Echo showed LVEF 50 to 55%, no wall motion abnormality, mildly elevated PASP, bilateral atrial mildly dilated, mild MR, mild AS.  Last seen 03/10/2021 and overall feeling well with occasional palpitations.  Atenolol was transitioned to metoprolol.  Today,the patient reports she has claudication symptoms. Says she has had ABIs in the past. Reports pain with walking in her legs. Also noticed some slow healing sores in the past and feet are cold at night. Pulses good on exam. BP is high. She says she has white coat syndrome. She takes it at home at times, 120-140/70-80s. Heart rate is 60-70s. No chest pain, sob, Lle, orthopnea, pnd. She does have a history of lightheadedness and dizziness. She has fallen in the past from this. 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   Past Medical History:  Diagnosis Date   Colon polyps    Family history of breast cancer    History of pulmonary embolism 2011   Hx of melanoma of skin 2003   Right leg   Hypertension    Melanoma ([place]) 2003    right lower leg. Treated in [place].   Personal history of radiation therapy    Seizures ([place]) 1993   no meds since (approx) 2014   Vertigo    x1. R/T perforated ear drum prior to 2013    Past Surgical History:  Procedure Laterality Date   ABDOMINAL HYSTERECTOMY  05/2014   BREAST BIOPSY Right 08/20/2018   invasive mammary carcinoma/DCIS    BREAST LUMPECTOMY Right 10/10/2018   BREAST LUMPECTOMY WITH RADIOACTIVE SEED LOCALIZATION Right 10/10/2018   Procedure: BREAST LUMPECTOMY WITH RADIOACTIVE SEED LOCALIZATION;  Surgeon: Jovita Kussmaul, MD;  Location: [facility];  Service: General;  Laterality: Right;   CATARACT EXTRACTION W/PHACO Right 01/06/2018   Procedure: CATARACT EXTRACTION PHACO AND INTRAOCULAR LENS PLACEMENT ([place]) RIGHT;  Surgeon: Leandrew Koyanagi, MD;  Location: [facility];  Service: Ophthalmology;  Laterality: Right;   CATARACT EXTRACTION W/PHACO Left 01/29/2018   Procedure: CATARACT EXTRACTION PHACO AND INTRAOCULAR LENS PLACEMENT ([place]) LEFT;  Surgeon: Leandrew Koyanagi, MD;  Location: [facility];  Service: Ophthalmology;  Laterality: Left;   left hand surgery  05/2011   s/p fall   OTHER SURGICAL HISTORY  2003   Removal of lymphoma from melonoma 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     Current Medications: No outpatient medications have been marked as taking for the 06/09/21 encounter (Appointment) with Kathlen Mody, Alam Guterrez H, PA-C.     Allergies:   Clobetasol, Clobetasol  propionate, Phenergan [promethazine hcl], Dextromethorphan, and Zithromax [azithromycin]   Social History   Socioeconomic History   Marital status: Married    Spouse name: Not on file   Number of children: Not on file   Years of education: Not on file   Highest education level: Not on file  Occupational History   Not on file  Tobacco Use   Smoking status: Never   Smokeless tobacco: Never  Vaping Use   Vaping Use: Never used  Substance and Sexual Activity   Alcohol use: No   Drug use: Never   Sexual  activity: Never  Other Topics Concern   Not on file  Social History Narrative   Recently moved from [place] to [facility].   Daughter and grandchildren live in [place].   Has other children in [place].   Retired Pharmacist, hospital.      Husband has been recovering from Prostate CA.   Social Determinants of Health   Financial Resource Strain: Not on file  Food Insecurity: Not on file  Transportation Needs: Not on file  Physical Activity: Not on file  Stress: Not on file  Social Connections: Not on file     Family History: The patient's family history includes Aortic aneurysm in her father; Breast cancer (age of onset: 49) in her niece; Breast cancer (age of onset: 12) in her daughter; Heart disease in her mother.  ROS:   Please see the history of present illness.     All other systems reviewed and are negative.  EKGs/Labs/Other Studies Reviewed:    The following studies were reviewed today:  Heart monitor Patch Wear Time:  13 days and 22 hours (2022-03-10T15:39:45-0500 to 2022-03-24T15:04:14-0400)   Patient had a min HR of 44 bpm, max HR of 187 bpm, and avg HR of 69 bpm. Predominant underlying rhythm was Sinus Rhythm. 35 Supraventricular Tachycardia runs occurred, the run with the fastest interval lasting 5 beats with a max rate of 187 bpm, the longest lasting 16.5 secs with an avg rate of 110 bpm. Supraventricular Tachycardia was detected within +/- 45 seconds of symptomatic patient event(s). Occasional PACs with a burden of 2.7% and occasional PVCs with a burden of 2.1%.    Echo 03/02/2021 1. Left ventricular ejection fraction, by estimation, is 50 to 55%. The  left ventricle has low normal function. The left ventricle has no regional  wall motion abnormalities. Left ventricular diastolic parameters are  indeterminate.   2. Right ventricular systolic function is low normal. The right  ventricular size is normal. There is mildly elevated pulmonary artery  systolic pressure.   3.  Left atrial size was mildly dilated.   4. Right atrial size was mildly dilated.   5. The mitral valve is degenerative. Mild mitral valve regurgitation.   6. The aortic valve is tricuspid. Aortic valve regurgitation is mild.  Mild aortic valve sclerosis is present, with no evidence of aortic valve  stenosis.   7. The inferior vena cava is normal in size with greater than 50%  respiratory variability, suggesting right atrial pressure of 3 mmHg.   EKG:  EKG is ordered today.  The ekg ordered today demonstrates NSR, PAC, 63bpm, moderate LVH, borderline LAD, poor R wave progression  Recent Labs: 01/04/2021: Hemoglobin 13.0; Platelets 184 01/06/2021: Magnesium 2.0; TSH 1.24 01/11/2021: ALT 38; BUN 23; Creatinine, Ser 0.96; Potassium 3.7; Sodium 136  Recent Lipid Panel    Component Value Date/Time   CHOL 236 (H) 04/26/2020 1503   TRIG 80.0 04/26/2020 1503  HDL 73.10 04/26/2020 1503   CHOLHDL 3 04/26/2020 1503   VLDL 16.0 04/26/2020 1503   LDLCALC 147 (H) 04/26/2020 1503   LDLDIRECT 158.5 11/04/2012 1105    Physical Exam:    VS:  There were no vitals taken for this visit.    Wt Readings from Last 3 Encounters:  05/08/21 134 lb 12.8 oz (61.1 kg)  03/17/21 134 lb 11.2 oz (61.1 kg)  03/10/21 137 lb (62.1 kg)     GEN:  Well nourished, well developed in no acute distress HEENT: Normal NECK: No JVD; No carotid bruits LYMPHATICS: No lymphadenopathy CARDIAC: RRR, no murmurs, rubs, gallops RESPIRATORY:  Clear to auscultation without rales, wheezing or rhonchi  ABDOMEN: Soft, non-tender, non-distended MUSCULOSKELETAL:  No edema; No deformity  SKIN: Warm and dry NEUROLOGIC:  Alert and oriented x 3 PSYCHIATRIC:  Normal affect   ASSESSMENT:    1. PVC (premature ventricular contraction)   2. SVT (supraventricular tachycardia) ([place])   3. Murmur   4. Mild aortic stenosis   5. Essential hypertension    PLAN:    In order of problems listed above:  PVC/PAC/PSVT By recent heart monitor.  She is fairely asymptomatic. Atenolol previouly changes to metoprolol.   Mild AS By recent echo normal LVEF and mild AS. Can follow with serial echo.   Chronic anticoagulation History of PE, continue warfarin  Hypertension BP elevated today, although has known white coat syndrome. Bps at home 120-140s/60-70s. Continue Lopressor, lisinopril, and HCTZ  Leg pain Reports bilateral leg pain when she walks, also cold feet at night. Says she has had ABIs in the past. Pulses good bilaterally. I will order bilateral ABIs. Continue Aspirin. Can consider updated lipid profile at follow-up.   Disposition: Follow up 3-4 months with APP/MD   Signed, Yisroel Mullendore Ninfa Meeker, PA-C  06/09/2021 8:22 AM    [facility]

## 2021-06-09 ENCOUNTER — Ambulatory Visit (INDEPENDENT_AMBULATORY_CARE_PROVIDER_SITE_OTHER): Payer: Medicare Other | Admitting: Medical

## 2021-06-09 ENCOUNTER — Other Ambulatory Visit: Payer: Self-pay

## 2021-06-09 ENCOUNTER — Encounter: Payer: Self-pay | Admitting: Medical

## 2021-06-09 VITALS — BP 162/92 | HR 63 | Ht 62.0 in | Wt 135.0 lb

## 2021-06-09 DIAGNOSIS — I739 Peripheral vascular disease, unspecified: Secondary | ICD-10-CM

## 2021-06-09 DIAGNOSIS — I350 Nonrheumatic aortic (valve) stenosis: Secondary | ICD-10-CM | POA: Diagnosis not present

## 2021-06-09 DIAGNOSIS — I493 Ventricular premature depolarization: Secondary | ICD-10-CM

## 2021-06-09 DIAGNOSIS — M79605 Pain in left leg: Secondary | ICD-10-CM | POA: Diagnosis not present

## 2021-06-09 DIAGNOSIS — M79604 Pain in right leg: Secondary | ICD-10-CM

## 2021-06-09 DIAGNOSIS — I10 Essential (primary) hypertension: Secondary | ICD-10-CM | POA: Diagnosis not present

## 2021-06-09 DIAGNOSIS — R011 Cardiac murmur, unspecified: Secondary | ICD-10-CM | POA: Diagnosis not present

## 2021-06-09 DIAGNOSIS — I471 Supraventricular tachycardia: Secondary | ICD-10-CM

## 2021-06-09 NOTE — Patient Instructions (Signed)
Medication Instructions:  - Your physician recommends that you continue on your current medications as directed. Please refer to the Current Medication list given to you today.  *If you need a refill on your cardiac medications before your next appointment, please call your pharmacy*   Lab Work: - none ordered  If you have labs (blood work) drawn today and your tests are completely normal, you will receive your results only by: [place] (if you have MyChart) OR A paper copy in the mail If you have any lab test that is abnormal or we need to change your treatment, we will call you to review the results.   Testing/Procedures: - Your physician has requested that you have an ankle brachial index (ABI). During this test an ultrasound and blood pressure cuff are used to evaluate the arteries that supply the arms and legs with blood. Allow thirty minutes for this exam. There are no restrictions or special instructions.  - Your physician has requested that you have a lower extremity arterial duplex. This test is an ultrasound of the arteries in the legs. It looks at arterial blood flow in the legs. Allow one hour for Lower Arterial scans. There are no restrictions or special instructions    Follow-Up: At [facility], you and your health needs are our priority.  As part of our continuing mission to provide you with exceptional heart care, we have created designated Provider Care Teams.  These Care Teams include your primary Cardiologist (physician) and Advanced Practice Providers (APPs -  Physician Assistants and Nurse Practitioners) who all work together to provide you with the care you need, when you need it.  We recommend signing up for the patient portal called "MyChart".  Sign up information is provided on this After Visit Summary.  MyChart is used to connect with patients for Virtual Visits (Telemedicine).  Patients are able to view lab/test results, encounter notes, upcoming  appointments, etc.  Non-urgent messages can be sent to your provider as well.   To learn more about what you can do with MyChart, go to [email or web address].    Your next appointment:   3-4 month(s)  The format for your next appointment:   In Person  Provider:   You may see Kathlyn Sacramento, MD or one of the following Advanced Practice Providers on your designated Care Team:   Murray Hodgkins, NP Christell Faith, PA-C Marrianne Mood, PA-C Cadence Kathlen Mody, [place]   Other Instructions N/a

## 2021-06-12 ENCOUNTER — Ambulatory Visit (INDEPENDENT_AMBULATORY_CARE_PROVIDER_SITE_OTHER): Payer: Medicare Other

## 2021-06-12 DIAGNOSIS — Z7901 Long term (current) use of anticoagulants: Secondary | ICD-10-CM

## 2021-06-12 DIAGNOSIS — Z86718 Personal history of other venous thrombosis and embolism: Secondary | ICD-10-CM | POA: Diagnosis not present

## 2021-06-12 LAB — POCT INR: INR: 2.3 (ref 2.0–3.0)

## 2021-06-12 NOTE — Patient Instructions (Addendum)
Pre visit review using our clinic review tool, if applicable. No additional management support is needed unless otherwise documented below in the visit note.  Continue to take 5 mg daily EXCEPT take 7.5 mg on Mondays and Thursdays. Re-check in four weeks, 06/12/21.

## 2021-06-14 NOTE — Progress Notes (Signed)
Agree. Thanks

## 2021-06-16 DIAGNOSIS — H7202 Central perforation of tympanic membrane, left ear: Secondary | ICD-10-CM | POA: Diagnosis not present

## 2021-06-16 DIAGNOSIS — H6123 Impacted cerumen, bilateral: Secondary | ICD-10-CM | POA: Diagnosis not present

## 2021-06-27 ENCOUNTER — Emergency Department
Admission: EM | Admit: 2021-06-27 | Discharge: 2021-06-27 | Disposition: A | Discharge status: Home / Self Care | Payer: Medicare Other | Attending: Emergency Medicine | Admitting: Emergency Medicine

## 2021-06-27 ENCOUNTER — Other Ambulatory Visit: Payer: Self-pay

## 2021-06-27 ENCOUNTER — Encounter: Payer: Self-pay | Admitting: Emergency Medicine

## 2021-06-27 DIAGNOSIS — I10 Essential (primary) hypertension: Secondary | ICD-10-CM | POA: Insufficient documentation

## 2021-06-27 DIAGNOSIS — Z79899 Other long term (current) drug therapy: Secondary | ICD-10-CM | POA: Diagnosis not present

## 2021-06-27 DIAGNOSIS — Z7901 Long term (current) use of anticoagulants: Secondary | ICD-10-CM | POA: Insufficient documentation

## 2021-06-27 DIAGNOSIS — Z7982 Long term (current) use of aspirin: Secondary | ICD-10-CM | POA: Diagnosis not present

## 2021-06-27 DIAGNOSIS — S61217A Laceration without foreign body of left little finger without damage to nail, initial encounter: Secondary | ICD-10-CM | POA: Insufficient documentation

## 2021-06-27 DIAGNOSIS — Z853 Personal history of malignant neoplasm of breast: Secondary | ICD-10-CM | POA: Insufficient documentation

## 2021-06-27 DIAGNOSIS — W268XXA Contact with other sharp object(s), not elsewhere classified, initial encounter: Secondary | ICD-10-CM | POA: Diagnosis not present

## 2021-06-27 DIAGNOSIS — Z85820 Personal history of malignant melanoma of skin: Secondary | ICD-10-CM | POA: Insufficient documentation

## 2021-06-27 DIAGNOSIS — S61412A Laceration without foreign body of left hand, initial encounter: Secondary | ICD-10-CM

## 2021-06-27 DIAGNOSIS — S6992XA Unspecified injury of left wrist, hand and finger(s), initial encounter: Secondary | ICD-10-CM | POA: Diagnosis present

## 2021-06-27 NOTE — ED Triage Notes (Signed)
Patient ambulatory to triage with steady gait, without difficulty or distress noted; pt reports cutting left 5th digit pad on reynolds wrap at 630pm; pt reports difficulty controlling bleeding; currently on coumadin; small lac noted with scant bleeding; gauze dressing applied

## 2021-06-27 NOTE — ED Provider Notes (Signed)
[facility] Emergency Department Provider Note  ____________________________________________   Event Date/Time   First MD Initiated Contact with Patient 06/27/21 0404     (approximate)  I have reviewed the triage vital signs and the nursing notes.   HISTORY  Chief Complaint Laceration   HPI Becky Farrell is a 87 y.o. female with a past medical history of PE on Coumadin, seizure disorder, HTN, melanoma and colonic polyps who presents for assessment of a cut to sustained to the tip of her left finger on wrapper for some spareribs that she was preparing earlier this evening.  He states he has been oozing since she cut it.  She denies any other injuries or cuts.  She is otherwise been in usual state health without any recent fevers, chills, cough, nausea, vomiting, [place] dysuria, rash or any other recent injuries or falls.  She states she has had a tetanus in the last 5 years.  No other acute concerns at this time.         Past Medical History:  Diagnosis Date   Colon polyps    Family history of breast cancer    History of pulmonary embolism 2011   Hx of melanoma of skin 2003   Right leg   Hypertension    Melanoma ([place]) 2003   right lower leg. [place]ated [place].   Personal history of radiation therapy    Seizures ([place]) 1993   no meds since (approx) 2014   Vertigo    x1. R/T perforated ear drum prior to 2013    Patient Active Problem List   Diagnosis Date Noted   Seizures ([place]) 05/08/2021   History of pulmonary embolus (PE) 05/08/2021   Carcinoma of upper-inner quadrant of right breast in female, estrogen receptor positive ([place]) 09/06/2018   TIA (transient ischemic attack) 07/06/2017   Osteoporosis 01/29/2013   HTN, goal below 140/90    History of DVT (deep vein thrombosis) 05/21/2011    Past Surgical History:  Procedure Laterality Date   ABDOMINAL HYSTERECTOMY  05/2014   BREAST BIOPSY Right 08/20/2018   invasive mammary carcinoma/DCIS     BREAST LUMPECTOMY Right 10/10/2018   BREAST LUMPECTOMY WITH RADIOACTIVE SEED LOCALIZATION Right 10/10/2018   Procedure: BREAST LUMPECTOMY WITH RADIOACTIVE SEED LOCALIZATION;  Surgeon: Jovita Kussmaul, MD;  Location: [place];  Service: General;  Laterality: Right;   CATARACT EXTRACTION W/PHACO Right 01/06/2018   Procedure: CATARACT EXTRACTION PHACO AND INTRAOCULAR LENS PLACEMENT ([place]) RIGHT;  Surgeon: Leandrew Koyanagi, MD;  Location: [place];  Service: Ophthalmology;  Laterality: Right;   CATARACT EXTRACTION W/PHACO Left 01/29/2018   Procedure: CATARACT EXTRACTION PHACO AND INTRAOCULAR LENS PLACEMENT ([place]) LEFT;  Surgeon: Leandrew Koyanagi, MD;  Location: [place];  Service: Ophthalmology;  Laterality: Left;   left hand surgery  05/2011   s/p fall   OTHER SURGICAL HISTORY  2003   Removal of lymphoma from melonoma 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     Prior to Admission medications   Medication Sig Start Date End Date Taking? Authorizing Provider  aspirin 81 MG tablet Take 81 mg by mouth daily.    [provider]  Cholecalciferol (VITAMIN D3) 2000 UNITS TABS Take 2,000 Units by mouth daily.     [provider]  denosumab (PROLIA) 60 MG/ML SOSY injection Inject 60 mg into the skin every 6 (six) months.    [provider]  hydrochlorothiazide (MICROZIDE) 12.5 MG capsule TAKE 1 CAPSULE BY MOUTH EVERY DAY 03/03/21  Jearld Fenton, NP  ketoconazole (NIZORAL) 2 % cream Apply to rash under breasts 2 times a day until rash improved. 04/03/21   Brendolyn Patty, MD  letrozole [facility]) 2.5 MG tablet TAKE 1 TABLET BY MOUTH EVERY DAY 07/28/20   Verlon Au, NP  lisinopril (ZESTRIL) 10 MG tablet TAKE 1 TABLET BY MOUTH EVERY DAY 05/21/21   Jearld Fenton, NP  metoprolol tartrate (LOPRESSOR) 25 MG tablet Take 1 tablet (25 mg total) by mouth 2 (two) times daily. 03/10/21 06/01/22  Loel Dubonnet, NP  polyethylene glycol (MIRALAX / GLYCOLAX) packet Take 17 g by mouth every other  day.    [provider]  warfarin (COUMADIN) 5 MG tablet TAKE ONE TABLET BY MOUTH DAILY EXCEPT TAKE 7.'5MG'$  ON MONDAYS AND THURSDAYS OR AS DIRECTED BY COUMADIN CLINIC 02/21/21   Jearld Fenton, NP  warfarin (COUMADIN) 7.5 MG tablet Take one tablet, 7.'5mg'$ ,  by mouth daily except take no tablet on Wednesdays or as directed by coumadin clinic. 02/21/21   Jearld Fenton, NP  Wheat Dextrin (BENEFIBER PO) Take 2 each by mouth See admin instructions. Mix 2 teaspoons and drink 3 times daily    [provider]    Allergies Clobetasol, Clobetasol propionate, Phenergan [promethazine hcl], Dextromethorphan, and Zithromax [azithromycin]  Family History  Problem Relation Age of Onset   Heart disease Mother    Aortic aneurysm Father    Breast cancer Daughter 67   Breast cancer Niece 57    Social History Social History   Tobacco Use   Smoking status: Never   Smokeless tobacco: Never  Vaping Use   Vaping Use: Never used  Substance Use Topics   Alcohol use: No   Drug use: Never    Review of Systems  Review of Systems  Constitutional:  Negative for chills and fever.  HENT:  Negative for sore throat.   Eyes:  Negative for pain.  Respiratory:  Negative for cough and stridor.   Cardiovascular:  Negative for chest pain.  Gastrointestinal:  Negative for vomiting.  Genitourinary:  Negative for dysuria.  Musculoskeletal:  Positive for myalgias (tip of L 5th digit).  Skin:  Negative for rash.  Neurological:  Negative for seizures, loss of consciousness and headaches.  Psychiatric/Behavioral:  Negative for suicidal ideas.   All other systems reviewed and are negative.    ____________________________________________   PHYSICAL EXAM:  VITAL SIGNS: ED Triage Vitals  Enc Vitals Group     BP 06/27/21 0402 (!) 180/100     Pulse Rate 06/27/21 0402 82     Resp 06/27/21 0402 18     Temp 06/27/21 0402 98 F (36.7 C)     Temp Source 06/27/21 0402 Oral     SpO2 06/27/21 0402 98 %      Weight 06/27/21 0400 134 lb (60.8 kg)     Height 06/27/21 0400 '5\' 2"'$  (1.575 m)     Head Circumference --      Peak Flow --      Pain Score 06/27/21 0359 0     Pain Loc --      Pain Edu? --      Excl. in [place]? --    Vitals:   06/27/21 0402  BP: (!) 180/100  Pulse: 82  Resp: 18  Temp: 98 F (36.7 C)  SpO2: 98%   Physical Exam Vitals and nursing note reviewed.  Constitutional:      General: She is not in acute distress.  Appearance: She is well-developed.  HENT:     Head: Normocephalic and atraumatic.     Right Ear: External ear normal.     Left Ear: External ear normal.     Nose: Nose normal.  Eyes:     Conjunctiva/sclera: Conjunctivae normal.  Cardiovascular:     Rate and Rhythm: Normal rate and regular rhythm.     Heart sounds: No murmur heard. Pulmonary:     Effort: Pulmonary effort is normal. No respiratory distress.  Abdominal:     General: There is no distension.     Palpations: Abdomen is soft.  Musculoskeletal:     Cervical back: Neck supple.  Skin:    General: Skin is warm and dry.  Neurological:     General: No focal deficit present.     Mental Status: She is alert.  Psychiatric:        Mood and Affect: Mood normal.    Approximately 0.5 linear laceration over the tuft of the ventral aspect of the left digit.  No nailbed involvement or dorsal involvement.  It does not cross the distal interphalangeal joint.  Patient is able to flex and extend at the interphalangeal joint.  No other trauma evident on the hand.  Sensation intact in the distribution of the radial and median nerves.  2+ radial pulse. ____________________________________________   LABS (all labs ordered are listed, but only abnormal results are displayed)  Labs Reviewed - No data to display ____________________________________________  EKG   ____________________________________________  RADIOLOGY  ED MD interpretation:    Official radiology report(s): No results  found.  ____________________________________________   PROCEDURES  Procedure(s) performed (including Critical Care):  Marland KitchenMarland KitchenLaceration Repair  Date/Time: 06/27/2021 4:21 AM Performed by: Lucrezia Starch, MD Authorized by: Lucrezia Starch, MD   Consent:    Consent obtained:  Verbal   Consent given by:  Patient   Risks, benefits, and alternatives were discussed: yes     Risks discussed:  Pain and infection Universal protocol:    Patient identity confirmed:  Verbally with patient Laceration details:    Location:  Finger   Finger location:  L small finger   Length (cm):  0.3 Exploration:    Hemostasis achieved with:  Direct pressure   Imaging outcome: foreign body not noted     Contaminated: no   Treatment:    Irrigation solution:  Tap water   Irrigation method:  Tap   Visualized foreign bodies/material removed: no   Skin repair:    Repair method:  Sutures   Suture size:  6-0   Suture material:  Nylon   Suture technique:  Simple interrupted   Number of sutures:  4 Approximation:    Approximation:  Close Repair type:    Repair type:  Simple Post-procedure details:    Procedure completion:  Tolerated well, no immediate complications   ____________________________________________   INITIAL IMPRESSION / ASSESSMENT AND PLAN / ED COURSE      Patient presents with [place] to history exam for assessment of some persistent oozing from a small cut she sustained a tuft of her left fifth digit shortly prior to arrival.  Treated clean the wound with warm soapy water prior to arrival.  Her tetanus is up-to-date.  On arrival she is hypertensive with otherwise stable vital signs on room air.  She has a small wound that is oozing without any other evidence to the digit or otherwise about the hand.  Wound repaired with procedure note above.  Stasis achieved.  Do not believe patient suffering from significant clinical hemorrhage or requires check of her INR hemoglobin and given absence of any  other acute concerns or findings on exam to suggest other significant injury I think she is stable for discharge with outpatient follow-up.  Discharged stable condition.  Strict return precautions advised and discussed        ____________________________________________   FINAL CLINICAL IMPRESSION(S) / ED DIAGNOSES  Final diagnoses:  Laceration of left hand without foreign body, initial encounter    Medications - No data to display   ED Discharge Orders     None        Note:  This document was prepared using Dragon voice recognition software and may include unintentional dictation errors.    Lucrezia Starch, MD 06/27/21 [phone]

## 2021-06-29 ENCOUNTER — Encounter: Payer: Self-pay | Admitting: Podiatry

## 2021-06-29 ENCOUNTER — Other Ambulatory Visit: Payer: Self-pay

## 2021-06-29 ENCOUNTER — Ambulatory Visit (INDEPENDENT_AMBULATORY_CARE_PROVIDER_SITE_OTHER): Payer: Medicare Other | Admitting: Podiatry

## 2021-06-29 DIAGNOSIS — D689 Coagulation defect, unspecified: Secondary | ICD-10-CM | POA: Diagnosis not present

## 2021-06-29 DIAGNOSIS — B351 Tinea unguium: Secondary | ICD-10-CM | POA: Diagnosis not present

## 2021-06-29 DIAGNOSIS — M79609 Pain in unspecified limb: Secondary | ICD-10-CM

## 2021-06-29 NOTE — Progress Notes (Signed)
This patient returns to my office for at risk foot care.  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  This patient is unable to cut nails herself since the patient cannot reach her nails.These nails are painful walking and wearing shoes.  This patient presents for at risk foot care today.  General Appearance  Alert, conversant and in no acute stress.  Vascular  Dorsalis pedis and posterior tibial  pulses are palpable  bilaterally.  Capillary return is within normal limits  bilaterally. Temperature is within normal limits  bilaterally.  Neurologic  Senn-Weinstein monofilament wire test within normal limits  bilaterally. Muscle power within normal limits bilaterally.  Nails Thick disfigured discolored nails with subungual debris  from hallux to fifth toes bilaterally. No evidence of bacterial infection or drainage bilaterally.  Orthopedic  No limitations of motion  feet .  No crepitus or effusions noted.  No bony pathology or digital deformities noted.  Skin  normotropic skin with no porokeratosis noted bilaterally.  No signs of infections or ulcers noted.     Onychomycosis  Pain in right toes  Pain in left toes  Consent was obtained for treatment procedures.   Mechanical debridement of nails 1-5  bilaterally performed with a nail nipper.  Filed with dremel without incident.    Return office visit    3 months                  Told patient to return for periodic foot care and evaluation due to potential at risk complications.   Gardiner Barefoot DPM

## 2021-07-07 ENCOUNTER — Other Ambulatory Visit: Payer: Self-pay

## 2021-07-07 DIAGNOSIS — M810 Age-related osteoporosis without current pathological fracture: Secondary | ICD-10-CM

## 2021-07-07 DIAGNOSIS — C50211 Malignant neoplasm of upper-inner quadrant of right female breast: Secondary | ICD-10-CM

## 2021-07-11 DIAGNOSIS — Z86718 Personal history of other venous thrombosis and embolism: Secondary | ICD-10-CM | POA: Diagnosis not present

## 2021-07-11 DIAGNOSIS — Z7901 Long term (current) use of anticoagulants: Secondary | ICD-10-CM | POA: Diagnosis not present

## 2021-07-11 DIAGNOSIS — Z86711 Personal history of pulmonary embolism: Secondary | ICD-10-CM | POA: Diagnosis not present

## 2021-07-12 ENCOUNTER — Inpatient Hospital Stay: Payer: Medicare Other

## 2021-07-12 ENCOUNTER — Ambulatory Visit (INDEPENDENT_AMBULATORY_CARE_PROVIDER_SITE_OTHER): Payer: Medicare Other

## 2021-07-12 ENCOUNTER — Inpatient Hospital Stay: Payer: Medicare Other | Attending: Oncology

## 2021-07-12 ENCOUNTER — Encounter: Payer: Self-pay | Admitting: Oncology

## 2021-07-12 ENCOUNTER — Ambulatory Visit: Payer: Medicare Other | Admitting: Oncology

## 2021-07-12 ENCOUNTER — Other Ambulatory Visit: Payer: Self-pay

## 2021-07-12 ENCOUNTER — Ambulatory Visit: Payer: Medicare Other

## 2021-07-12 ENCOUNTER — Inpatient Hospital Stay (HOSPITAL_BASED_OUTPATIENT_CLINIC_OR_DEPARTMENT_OTHER): Payer: Medicare Other | Admitting: Oncology

## 2021-07-12 ENCOUNTER — Other Ambulatory Visit: Payer: Medicare Other

## 2021-07-12 VITALS — BP 130/70 | HR 69 | Temp 98.5°F | Resp 18 | Ht 63.98 in | Wt 133.7 lb

## 2021-07-12 DIAGNOSIS — Z170 Estrogen receptor positive status [ER+]: Secondary | ICD-10-CM | POA: Diagnosis not present

## 2021-07-12 DIAGNOSIS — Z79811 Long term (current) use of aromatase inhibitors: Secondary | ICD-10-CM

## 2021-07-12 DIAGNOSIS — M810 Age-related osteoporosis without current pathological fracture: Secondary | ICD-10-CM | POA: Insufficient documentation

## 2021-07-12 DIAGNOSIS — Z7901 Long term (current) use of anticoagulants: Secondary | ICD-10-CM

## 2021-07-12 DIAGNOSIS — Z923 Personal history of irradiation: Secondary | ICD-10-CM | POA: Insufficient documentation

## 2021-07-12 DIAGNOSIS — C50211 Malignant neoplasm of upper-inner quadrant of right female breast: Secondary | ICD-10-CM | POA: Insufficient documentation

## 2021-07-12 DIAGNOSIS — Z86711 Personal history of pulmonary embolism: Secondary | ICD-10-CM | POA: Diagnosis not present

## 2021-07-12 LAB — POCT INR: INR: 2.3 (ref 2.0–3.0)

## 2021-07-12 LAB — CBC WITH DIFFERENTIAL/PLATELET
Abs Immature Granulocytes: 0.02 10*3/uL (ref 0.00–0.07)
Basophils Absolute: 0 10*3/uL (ref 0.0–0.1)
Basophils Relative: 0 %
Eosinophils Absolute: 0.1 10*3/uL (ref 0.0–0.5)
Eosinophils Relative: 1 %
HCT: 38.5 % (ref 36.0–46.0)
Hemoglobin: 13 g/dL (ref 12.0–15.0)
Immature Granulocytes: 0 %
Lymphocytes Relative: 19 %
Lymphs Abs: 1.1 10*3/uL (ref 0.7–4.0)
MCH: 32.6 pg (ref 26.0–34.0)
MCHC: 33.8 g/dL (ref 30.0–36.0)
MCV: 96.5 fL (ref 80.0–100.0)
Monocytes Absolute: 0.4 10*3/uL (ref 0.1–1.0)
Monocytes Relative: 7 %
Neutro Abs: 3.9 10*3/uL (ref 1.7–7.7)
Neutrophils Relative %: 73 %
Platelets: 183 10*3/uL (ref 150–400)
RBC: 3.99 MIL/uL (ref 3.87–5.11)
RDW: 12.6 % (ref 11.5–15.5)
WBC: 5.4 10*3/uL (ref 4.0–10.5)
nRBC: 0 % (ref 0.0–0.2)

## 2021-07-12 LAB — COMPREHENSIVE METABOLIC PANEL
ALT: 13 U/L (ref 0–44)
AST: 21 U/L (ref 15–41)
Albumin: 3.9 g/dL (ref 3.5–5.0)
Alkaline Phosphatase: 46 U/L (ref 38–126)
Anion gap: 7 (ref 5–15)
BUN: 20 mg/dL (ref 8–23)
CO2: 25 mmol/L (ref 22–32)
Calcium: 9.4 mg/dL (ref 8.9–10.3)
Chloride: 102 mmol/L (ref 98–111)
Creatinine, Ser: 0.76 mg/dL (ref 0.44–1.00)
GFR, Estimated: >60 mL/min (ref >60)
Glucose, Bld: 159 mg/dL — ABNORMAL HIGH (ref 70–99)
Potassium: 3.9 mmol/L (ref 3.5–5.1)
Sodium: 134 mmol/L — ABNORMAL LOW (ref 135–145)
Total Bilirubin: 0.5 mg/dL (ref 0.3–1.2)
Total Protein: 6.8 g/dL (ref 6.5–8.1)

## 2021-07-12 MED ORDER — DENOSUMAB 60 MG/ML ~~LOC~~ SOSY
60.0000 mg | PREFILLED_SYRINGE | Freq: Once | SUBCUTANEOUS | Status: AC
  Administered 2021-07-12: 60 mg via SUBCUTANEOUS
  Filled 2021-07-12: qty 1

## 2021-07-12 NOTE — Progress Notes (Signed)
[facility]  [address], [address] [place], [place] [address] Phone: [phone]  Fax: [phone]   Clinic Day:  07/12/2021  Referring physician: Lorre Munroe, NP  Chief Complaint: Becky Farrell is a 87 y.o. female presents for follow up of stage I right breast cancer and osteoporosis- on prolia  PERTINENT ONCOLOGY HISTORY Patient previously followed up by Dr.Corcoran, patient switched care to me on 07/12/21 Extensive medical record review was performed by me  stage T1aNx right breast cancer s/p lumpectomy on 10/10/2018. Pathology revealed a 0.4 cm grade I invasive ductal carcinoma with mucinous features.  There was intermediate grade DCIS.  Margins were uninvolved (1.0 cm posterior margin).  There were fibrocystic changes including apocrine metaplasia.  Tumor was ER + (100%), PR + (90%), and Her2/neu-.  Ki67 was 10%.  Pathologic stage was T1aNx.  CA27.29 was 14.1 on 09/08/2018.    12/16/2018 - 02/03/2019.She received radiation. She received 5040 cGy in 28 fractions with a scar boost of another 1400 cGy using electron beam.   06/09/2019.  Patient is started on letrozole.  #Osteoporosis 07/31/2018 DEXA revealed osteoporosis with a T-score of -3.4 in the right forearm radius and -1.9 in the right femoral neck.   08/09/2020 DEXA revealed osteoporosis with a T-score of -3.2 in the forearm radius.  Right femoral neck revealed osteopenia with a T-score of -2.0.   07/07/2019 patient started Prolia every 6 months.  #History of pulmonary embolism x2.  Initial event occurred in 2011.  Recurrent pulmonary embolism after holding anticoagulation for 1 month for planned colonoscopy.  Patient is currently on lifelong anticoagulation with Coumadin.  Managed by primary care provider.  #Family history of breast cancer-[daughter and niece].  [facility] genetic testing was negative.  #Patient has history of seizure and previously on Tegretol and is currently off.  She has  not had any recent seizure/spells she follows up with neurology Dr. Malvin Johns. chronic ataxia/sensory loss/difficulty walking.  Neurology note was reviewed.     . INTERVAL HISTORY Becky Farrell is a 87 y.o. female who has above history reviewed by me today presents for follow up visit for management of history of stage I right breast cancer, osteoporosis Problems and complaints are listed below: Today patient reports no breast concerns. She has bilateral screening mammogram scheduled on 08/17/2021. No new complaints.  Continues to have chronic balancing problem.  Past Medical History:  Diagnosis Date   Colon polyps    Family history of breast cancer    History of pulmonary embolism 2011   Hx of melanoma of skin 2003   Right leg   Hypertension    Melanoma ([facility]) 2003   right lower leg. [place]ated in [place].   Personal history of radiation therapy    Seizures ([facility]) 1993   no meds since (approx) 2014   Vertigo    x1. R/T perforated ear drum prior to 2013    Past Surgical History:  Procedure Laterality Date   ABDOMINAL HYSTERECTOMY  05/2014   BREAST BIOPSY Right 08/20/2018   invasive mammary carcinoma/DCIS    BREAST LUMPECTOMY Right 10/10/2018   BREAST LUMPECTOMY WITH RADIOACTIVE SEED LOCALIZATION Right 10/10/2018   Procedure: BREAST LUMPECTOMY WITH RADIOACTIVE SEED LOCALIZATION;  Surgeon: Griselda Miner, MD;  Location: [facility] OR;  Service: General;  Laterality: Right;   CATARACT EXTRACTION W/PHACO Right 01/06/2018   Procedure: CATARACT EXTRACTION PHACO AND INTRAOCULAR LENS PLACEMENT ([place]) RIGHT;  Surgeon: Lockie Mola, MD;  [facility];  Service: Ophthalmology;  Laterality: Right;   CATARACT EXTRACTION W/PHACO Left 01/29/2018   Procedure: CATARACT EXTRACTION PHACO AND INTRAOCULAR LENS PLACEMENT ([place]) LEFT;  Surgeon: Leandrew Koyanagi, MD;  Location: [place];  Service: Ophthalmology;  Laterality: Left;   left hand surgery  05/2011   s/p fall   OTHER  SURGICAL HISTORY  2003   Removal of lymphoma from melonoma 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     Family History  Problem Relation Age of Onset   Heart disease Mother    Aortic aneurysm Father    Breast cancer Daughter 33   Breast cancer Niece 54    Social History:  reports that she has never smoked. She has never used smokeless tobacco. She reports that she does not drink alcohol and does not use drugs.  Patient moved from [place] to [place].  Her daughter and grandchildren live in [place].  She has other children in [place].   She is a retired Pharmacist, hospital. 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   Allergies:  Allergies  Allergen Reactions   Clobetasol Other (See Comments)    vertigo   Clobetasol Propionate Other (See Comments)    This is ointment form UNSPECIFIED REACTION    Phenergan [Promethazine Hcl] Other (See Comments)    Rapid heart rate   Dextromethorphan Palpitations and Other (See Comments)    promethazine with DM, rapid hearbeat   Zithromax [Azithromycin] Palpitations and Other (See Comments)    Per OSH report, pt unable to recall Rapid heart rate    Current Medications: Current Outpatient Medications  Medication Sig Dispense Refill   aspirin 81 MG tablet Take 81 mg by mouth daily.     Cholecalciferol (VITAMIN D3) 2000 UNITS TABS Take 2,000 Units by mouth daily.      denosumab (PROLIA) 60 MG/ML SOSY injection Inject 60 mg into the skin every 6 (six) months.     hydrochlorothiazide (MICROZIDE) 12.5 MG capsule TAKE 1 CAPSULE BY MOUTH EVERY DAY 90 capsule 1   letrozole (FEMARA) 2.5 MG tablet TAKE 1 TABLET BY MOUTH EVERY DAY 90 tablet 3   lisinopril (ZESTRIL) 10 MG tablet TAKE 1 TABLET BY MOUTH EVERY DAY 90 tablet 1   metoprolol tartrate (LOPRESSOR) 25 MG tablet Take 1 tablet (25 mg total) by mouth 2 (two) times daily. 180 tablet 1   polyethylene glycol (MIRALAX / GLYCOLAX) packet Take 17 g by mouth every other day.     warfarin (COUMADIN) 5 MG tablet TAKE ONE TABLET BY MOUTH DAILY EXCEPT  TAKE 7.$Remov'5MG'VCLvuI$  ON MONDAYS AND THURSDAYS OR AS DIRECTED BY COUMADIN CLINIC 105 tablet 1   warfarin (COUMADIN) 7.5 MG tablet Take one tablet, 7.$RemoveBefor'5mg'[id number]$ ,  by mouth daily except take no tablet on Wednesdays or as directed by coumadin clinic. 70 tablet 1   Wheat Dextrin (BENEFIBER PO) Take 2 each by mouth See admin instructions. Mix 2 teaspoons and drink 3 times daily     ketoconazole (NIZORAL) 2 % cream Apply to rash under breasts 2 times a day until rash improved. (Patient not taking: Reported on 07/12/2021) 60 g 2   No current facility-administered medications for this visit.   Review of Systems  Constitutional:  Negative for chills, diaphoresis, fever, malaise/fatigue and weight loss (up 1 lb).  HENT:  Positive for hearing loss. Negative for congestion, ear discharge, ear pain, nosebleeds, sinus pain, sore throat and tinnitus.   Eyes: Negative.  Negative for blurred vision, double vision and photophobia.  Respiratory:  Negative for cough, hemoptysis, sputum production and shortness of breath.  Recurrent PE x 2.  Cardiovascular: Negative.  Negative for chest pain, palpitations, orthopnea, leg swelling and PND.  Gastrointestinal: Negative.  Negative for abdominal pain, blood in stool, constipation, diarrhea, heartburn, melena, nausea and vomiting.  Genitourinary: Negative.  Negative for dysuria, frequency, hematuria and urgency.  Musculoskeletal:  Negative for back pain, falls, joint pain, myalgias and neck pain.       Osteoporosis.  Skin: Negative.  Negative for itching and rash.  Neurological:  Positive for dizziness and tingling (hands and sometimes feet). Negative for tremors, sensory change, speech change, focal weakness, seizures (history), weakness and headaches.       Chronic balance issues.  Endo/Heme/Allergies:  Does not bruise/bleed easily (on Coumadin).  Psychiatric/Behavioral: Negative.  Negative for depression and memory loss. The patient is not nervous/anxious and does not have  insomnia.   All other systems reviewed and are negative. Performance status (ECOG): 1  Vitals Blood pressure 130/70, pulse 69, temperature 98.5 F (36.9 C), temperature source Tympanic, resp. rate 18, height 5' 3.98" (1.625 m), weight 133 lb 11.2 oz (60.6 kg), SpO2 99 %.   Physical Exam Vitals and nursing note reviewed.  Constitutional:      General: She is not in acute distress.    Appearance: She is well-developed. She is not diaphoretic.  HENT:     Head: Normocephalic and atraumatic.  Eyes:     General: No scleral icterus.    Conjunctiva/sclera: Conjunctivae normal.  Neck:     Vascular: No JVD.  Cardiovascular:     Rate and Rhythm: Normal rate.     Heart sounds: Normal heart sounds. No murmur heard. Pulmonary:     Effort: Pulmonary effort is normal. No respiratory distress.     Breath sounds: Normal breath sounds. No wheezing or rales.  Musculoskeletal:        General: No swelling or tenderness.  Skin:    General: Skin is warm and dry.  Neurological:     Mental Status: She is alert and oriented to person, place, and time.  Psychiatric:        Mood and Affect: Mood normal.   Breast exam was performed in seated and lying down position. Right lumpectomy with a well-healed surgical scar. No evidence of any palpable masses in bilateral breasts.  Bo palpable axillary adenopathy bilaterally.    Appointment on 07/12/2021  Component Date Value Ref Range Status   WBC 07/12/2021 5.4  4.0 - 10.5 K/uL Final   RBC 07/12/2021 3.99  3.87 - 5.11 MIL/uL Final   Hemoglobin 07/12/2021 13.0  12.0 - 15.0 g/dL Final   HCT 07/12/2021 38.5  36.0 - 46.0 % Final   MCV 07/12/2021 96.5  80.0 - 100.0 fL Final   MCH 07/12/2021 32.6  26.0 - 34.0 pg Final   MCHC 07/12/2021 33.8  30.0 - 36.0 g/dL Final   RDW 07/12/2021 12.6  11.5 - 15.5 % Final   Platelets 07/12/2021 183  150 - 400 K/uL Final   nRBC 07/12/2021 0.0  0.0 - 0.2 % Final   Neutrophils Relative % 07/12/2021 73  % Final   Neutro Abs  07/12/2021 3.9  1.7 - 7.7 K/uL Final   Lymphocytes Relative 07/12/2021 19  % Final   Lymphs Abs 07/12/2021 1.1  0.7 - 4.0 K/uL Final   Monocytes Relative 07/12/2021 7  % Final   Monocytes Absolute 07/12/2021 0.4  0.1 - 1.0 K/uL Final   Eosinophils Relative 07/12/2021 1  % Final   Eosinophils Absolute 07/12/2021 0.1  0.0 - 0.5 K/uL Final   Basophils Relative 07/12/2021 0  % Final   Basophils Absolute 07/12/2021 0.0  0.0 - 0.1 K/uL Final   Immature Granulocytes 07/12/2021 0  % Final   Abs Immature Granulocytes 07/12/2021 0.02  0.00 - 0.07 K/uL Final   Performed at [facility], [address] [place], [place] [address]  Anti-coag visit on 07/12/2021  Component Date Value Ref Range Status   INR 07/12/2021 2.3  2.0 - 3.0 Final    Assessment and Plan 1. Carcinoma of upper-inner quadrant of right breast in female, estrogen receptor positive ([place])   2. Age-related osteoporosis without current pathological fracture   3. Aromatase inhibitor use   4. Chronic anticoagulation     # Stage T1aNx right breast cancer [ invasive ductal carcinoma with mucinous features] -08/2018  s/p surgery and radiation Labs are reviewed and discussed with patient. Continue Letrozole. Discussed with patient of recommendation of 5 years if she tolerates.  Clinically she has no signs of disease recurrence. .   Bilateral mammogram on 08/09/2020 revealed no evidence of recurrent disease.  Due for annual mammogram in Sept.2022.  CA 27.29 has been followed up in the past. Not clear clinic utility of tumor marker in her case.  Will check annually in stead of every visit.   # Osteoporosis Recommend patient to continue calcium and vitamin D supplementation. . Proceed with Prolia today.   # History of recurrent PE, continue long term anticoagulation with coumadin. Follows up with coumadin clinic.   # RTC in 6 months for MD assess, labs (CBC with diff, CMP), and Prolia.  I discussed the assessment and treatment  plan with the patient. The patient was provided an opportunity to ask questions and all were answered. The patient agreed with the plan and demonstrated an understanding of the instructions. The patient was advised to call back if the symptoms worsen or if the condition fails to improve as anticipated.   We spent sufficient time to discuss many aspect of care, questions were answered to patient's satisfaction. A total of 40 minutes was spent on this visit.  With 15 minutes spent reviewing image findings, pathology reports, 20 minutes counseling the patient on the diagnosis, treatment plan, side effects of the treatment, management of symptoms.  Additional 5 minutes was spent on answering patient's questions.  Earlie Server, MD, PhD Hematology Oncology [place] [facility] 07/12/2021

## 2021-07-12 NOTE — Patient Instructions (Addendum)
Pre visit review using our clinic review tool, if applicable. No additional management support is needed unless otherwise documented below in the visit note.  Continue to take 5 mg daily EXCEPT take 7.5 mg on Mondays and Thursdays. Re-check in four weeks, 08/08/21.

## 2021-07-13 LAB — CANCER ANTIGEN 27.29: CA 27.29: 17.4 U/mL (ref 0.0–38.6)

## 2021-08-01 ENCOUNTER — Ambulatory Visit (INDEPENDENT_AMBULATORY_CARE_PROVIDER_SITE_OTHER): Payer: Medicare Other

## 2021-08-01 ENCOUNTER — Other Ambulatory Visit: Payer: Self-pay

## 2021-08-01 DIAGNOSIS — I739 Peripheral vascular disease, unspecified: Secondary | ICD-10-CM

## 2021-08-10 DIAGNOSIS — Z86711 Personal history of pulmonary embolism: Secondary | ICD-10-CM | POA: Diagnosis not present

## 2021-08-10 DIAGNOSIS — Z86718 Personal history of other venous thrombosis and embolism: Secondary | ICD-10-CM | POA: Diagnosis not present

## 2021-08-10 LAB — POCT INR: INR: 2.3 (ref 2.0–3.0)

## 2021-08-11 ENCOUNTER — Ambulatory Visit (INDEPENDENT_AMBULATORY_CARE_PROVIDER_SITE_OTHER): Payer: Medicare Other

## 2021-08-11 ENCOUNTER — Other Ambulatory Visit: Payer: Self-pay | Admitting: Internal Medicine

## 2021-08-11 DIAGNOSIS — Z7901 Long term (current) use of anticoagulants: Secondary | ICD-10-CM

## 2021-08-11 NOTE — Patient Instructions (Addendum)
Pre visit review using our clinic review tool, if applicable. No additional management support is needed unless otherwise documented below in the visit note.  Continue to take 5 mg daily EXCEPT take 7.5 mg on Mondays and Thursdays. Re-check in four weeks,09/05/21.

## 2021-08-12 NOTE — Telephone Encounter (Signed)
Requested medications are due for refill today.  yes  Requested medications are on the active medications list.  yes  Last refill. 02/21/2021  Future visit scheduled.   no  Notes to clinic.  No protocol

## 2021-08-13 NOTE — Progress Notes (Signed)
Agree. Thanks

## 2021-08-17 ENCOUNTER — Ambulatory Visit
Admission: RE | Admit: 2021-08-17 | Discharge: 2021-08-17 | Disposition: A | Discharge status: Home / Self Care | Payer: Medicare Other | Source: Ambulatory Visit | Attending: Hematology and Oncology | Admitting: Hematology and Oncology

## 2021-08-17 ENCOUNTER — Other Ambulatory Visit: Payer: Self-pay

## 2021-08-17 DIAGNOSIS — Z1231 Encounter for screening mammogram for malignant neoplasm of breast: Secondary | ICD-10-CM | POA: Diagnosis not present

## 2021-08-23 ENCOUNTER — Telehealth: Payer: Self-pay

## 2021-08-23 NOTE — Telephone Encounter (Signed)
Pt LVM requesting INR and PTT results because she did not write them in her book the last time she was called. She also wanted to verify the date she was to be rechecked. Advised pt of results and retesting. Pt verbalized understanding.

## 2021-08-25 ENCOUNTER — Other Ambulatory Visit: Payer: Self-pay | Admitting: Nurse Practitioner

## 2021-08-25 ENCOUNTER — Other Ambulatory Visit: Payer: Self-pay | Admitting: Internal Medicine

## 2021-08-25 ENCOUNTER — Other Ambulatory Visit: Payer: Self-pay

## 2021-08-25 ENCOUNTER — Other Ambulatory Visit: Payer: Self-pay | Admitting: Family

## 2021-08-25 DIAGNOSIS — Z170 Estrogen receptor positive status [ER+]: Secondary | ICD-10-CM

## 2021-08-25 DIAGNOSIS — Z7901 Long term (current) use of anticoagulants: Secondary | ICD-10-CM

## 2021-08-25 MED ORDER — WARFARIN SODIUM 7.5 MG PO TABS
ORAL_TABLET | ORAL | 1 refills | Status: DC
Start: 2021-08-25 — End: 2022-03-20

## 2021-08-25 NOTE — Telephone Encounter (Signed)
Pt has been compliant with coumadin management. Tried to send in coumadin but unable to selectively sign so ordered coumadin in an orders only encounter.   Still need microzide filled. Will send to the [facility] pool for consideration for refill.  Coumadin can be denied since it was already sent in.

## 2021-08-25 NOTE — Telephone Encounter (Signed)
Rx(s) sent to pharmacy electronically.  

## 2021-08-25 NOTE — Progress Notes (Signed)
Sent in coumadin

## 2021-09-01 ENCOUNTER — Encounter: Payer: Self-pay | Admitting: Hematology and Oncology

## 2021-09-07 ENCOUNTER — Ambulatory Visit (INDEPENDENT_AMBULATORY_CARE_PROVIDER_SITE_OTHER): Payer: Medicare Other

## 2021-09-07 DIAGNOSIS — D802 Selective deficiency of immunoglobulin A [IgA]: Secondary | ICD-10-CM | POA: Diagnosis not present

## 2021-09-07 DIAGNOSIS — Z7901 Long term (current) use of anticoagulants: Secondary | ICD-10-CM | POA: Diagnosis not present

## 2021-09-07 DIAGNOSIS — Z86711 Personal history of pulmonary embolism: Secondary | ICD-10-CM | POA: Diagnosis not present

## 2021-09-07 DIAGNOSIS — Z86718 Personal history of other venous thrombosis and embolism: Secondary | ICD-10-CM | POA: Diagnosis not present

## 2021-09-07 LAB — POCT INR: INR: 2.5 (ref 2.0–3.0)

## 2021-09-07 NOTE — Patient Instructions (Addendum)
Pre visit review using our clinic review tool, if applicable. No additional management support is needed unless otherwise documented below in the visit note.  Fax with INR result from yesterday of 2.5 was received today. PTT was 23.4 Continue to take 5 mg daily EXCEPT take 7.5 mg on Mondays and Thursdays. Re-check in four weeks,10/02/21.

## 2021-09-14 DIAGNOSIS — Z23 Encounter for immunization: Secondary | ICD-10-CM | POA: Diagnosis not present

## 2021-09-19 NOTE — Progress Notes (Signed)
Cardiology Office Note:    Date:  09/20/2021   ID:  Becky Farrell, DOB 1934-07-20, MRN [id number]  PCP:  Jearld Fenton, NP  [facility] Cardiologist:  Kathlyn Sacramento, MD  [facility] Electrophysiologist:  None   Referring MD: Jearld Fenton, NP   Chief Complaint: 3-4 month follow-up  History of Present Illness:    Becky Farrell is a 87 y.o. female with a hx of PVC, SVT, palpitations, dizziness, PE on warfarin, osteoporosis, hypertension, right breast cancer status postlumpectomy and radiation, mild aortic stenosis presents for follow-up.  Her and her husband reside at [facility].   Seen by Dr. Cindee Lame 02/01/2021 reported mild exertional dyspnea and PVCs.  Murmur was noted on exam.  ZIO and echo were recommended.  Her ZIO showed dominantly normal sinus rhythm with occasional PACs with a 2.7 burden, PVCs with a 2.7 burden, 35 runs of SVT fastest 5 beats with 187 bpm and longest 16.5 seconds with a rate of 110 bpm.  Echo showed LVEF 50 to 55%, no wall motion abnormality, mildly elevated PASP, bilateral atrial mildly dilated, mild MR, mild AS.   Seen 03/10/2021 and overall feeling well with occasional palpitations.  Atenolol was transitioned to metoprolol  Last seen 06/09/21 and reported claudication symptoms. B/l ABIS showed no significant disease bilaterally.   Today, ABIs were dicussed. Has general imbalance with some dizziness. She is willing to go to therapy for this, will discuss with PCP. BP and heart rate good today. Unsure where the imbalance comes from. Can occur with changing positions, but not always. NO chest pain, sob, orthopnea, pnd. She has some intermittent dependent edema.   Past Medical History:  Diagnosis Date   Colon polyps    Family history of breast cancer    History of pulmonary embolism 2011   Hx of melanoma of skin 2003   Right leg   Hypertension    Melanoma ([place]) 2003   right [place] leg. [place].   Personal history of radiation therapy     Seizures ([place]) 1993   no meds since (approx) 2014   Vertigo    x1. R/T perforated ear drum prior to 2013    Past Surgical History:  Procedure Laterality Date   ABDOMINAL HYSTERECTOMY  05/2014   BREAST BIOPSY Right 08/20/2018   invasive mammary carcinoma/DCIS    BREAST LUMPECTOMY Right 10/10/2018   BREAST LUMPECTOMY WITH RADIOACTIVE SEED LOCALIZATION Right 10/10/2018   Procedure: BREAST LUMPECTOMY WITH RADIOACTIVE SEED LOCALIZATION;  Surgeon: Jovita Kussmaul, MD;  Location: [place];  Service: General;  Laterality: Right;   CATARACT EXTRACTION W/PHACO Right 01/06/2018   Procedure: CATARACT EXTRACTION PHACO AND INTRAOCULAR LENS PLACEMENT ([place]) RIGHT;  Surgeon: Leandrew Koyanagi, MD;  Location: [place];  Service: Ophthalmology;  Laterality: Right;   CATARACT EXTRACTION W/PHACO Left 01/29/2018   Procedure: CATARACT EXTRACTION PHACO AND INTRAOCULAR LENS PLACEMENT ([place]) LEFT;  Surgeon: Leandrew Koyanagi, MD;  Location: [place];  Service: Ophthalmology;  Laterality: Left;   left hand surgery  05/2011   s/p fall   OTHER SURGICAL HISTORY  2003   Removal of lymphoma from melonoma 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     Current Medications: Current Meds  Medication Sig   aspirin 81 MG tablet Take 81 mg by mouth daily.   Cholecalciferol (VITAMIN D3) 2000 UNITS TABS Take 2,000 Units by mouth daily.    denosumab (PROLIA) 60 MG/ML SOSY injection Inject 60 mg into the skin every 6 (six) months.  hydrochlorothiazide (MICROZIDE) 12.5 MG capsule TAKE 1 CAPSULE BY MOUTH EVERY DAY   ketoconazole (NIZORAL) 2 % cream Apply to rash under breasts 2 times a day until rash improved.   letrozole (FEMARA) 2.5 MG tablet TAKE 1 TABLET BY MOUTH EVERY DAY   lisinopril (ZESTRIL) 10 MG tablet TAKE 1 TABLET BY MOUTH EVERY DAY   metoprolol tartrate (LOPRESSOR) 25 MG tablet TAKE 1 TABLET BY MOUTH TWICE A DAY   polyethylene glycol (MIRALAX / GLYCOLAX) packet Take 17 g by mouth every other day.   warfarin (COUMADIN) 5  MG tablet TAKE ONE TABLET BY MOUTH DAILY EXCEPT TAKE 7.5MG  ON MONDAYS AND THURSDAYS OR AS DIRECTED BY COUMADIN CLINIC   warfarin (COUMADIN) 7.5 MG tablet Take one tablet, 7.5mg ,  by mouth on Mondays and Thursday or as directed by anticoagulation clinic.   warfarin (COUMADIN) 7.5 MG tablet Take one tablet, 7.5mg ,  by mouth on Mondays and Thursdays or as directed by anticoagulation clinic.   Wheat Dextrin (BENEFIBER PO) Take 2 each by mouth See admin instructions. Mix 2 teaspoons and drink 3 times daily     Allergies:   Clobetasol, Clobetasol propionate, Phenergan [promethazine hcl], Dextromethorphan, and Zithromax [azithromycin]   Social History   Socioeconomic History   Marital status: Married    Spouse name: Not on file   Number of children: Not on file   Years of education: Not on file   Highest education level: Not on file  Occupational History   Not on file  Tobacco Use   Smoking status: Never   Smokeless tobacco: Never  Vaping Use   Vaping Use: Never used  Substance and Sexual Activity   Alcohol use: No   Drug use: Never   Sexual activity: Never  Other Topics Concern   Not on file  Social History Narrative   Recently moved from [place] to [facility].   Daughter and grandchildren live in [place].   Has other children in [place].   Retired Pharmacist, hospital.      Husband has been recovering from Prostate CA.   Social Determinants of Health   Financial Resource Strain: Not on file  Food Insecurity: Not on file  Transportation Needs: Not on file  Physical Activity: Not on file  Stress: Not on file  Social Connections: Not on file     Family History: The patient's family history includes Aortic aneurysm in her father; Breast cancer (age of onset: 61) in her niece; Breast cancer (age of onset: 17) in her daughter; Heart disease in her mother.  ROS:   Please see the history of present illness.     All other systems reviewed and are negative.  EKGs/Labs/Other Studies Reviewed:     The following studies were reviewed today:  Echo 01/2021  1. Left ventricular ejection fraction, by estimation, is 50 to 55%. The  left ventricle has low normal function. The left ventricle has no regional  wall motion abnormalities. Left ventricular diastolic parameters are  indeterminate.   2. Right ventricular systolic function is low normal. The right  ventricular size is normal. There is mildly elevated pulmonary artery  systolic pressure.   3. Left atrial size was mildly dilated.   4. Right atrial size was mildly dilated.   5. The mitral valve is degenerative. Mild mitral valve regurgitation.   6. The aortic valve is tricuspid. Aortic valve regurgitation is mild.  Mild aortic valve sclerosis is present, with no evidence of aortic valve  stenosis.   7. The inferior  vena cava is normal in size with greater than 50%  respiratory variability, suggesting right atrial pressure of 3 mmHg.   Heart monitor 01/2021 Patient had a min HR of 44 bpm, max HR of 187 bpm, and avg HR of 69 bpm.  Predominant underlying rhythm was Sinus Rhythm.  35 Supraventricular Tachycardia runs occurred, the run with the fastest interval lasting 5 beats with a max rate of 187 bpm, the longest lasting 16.5 secs with an avg rate of 110 bpm. Supraventricular Tachycardia was detected within +/- 45 seconds of symptomatic patient event(s). Occasional PACs with a burden of 2.7% and occasional PVCs with a burden of 2.1%.    ABIs 07/2021 Summary:  Right: Resting right ankle-brachial index is within normal range. No  evidence of significant right lower extremity arterial disease. The right  toe-brachial index is normal.   Left: Resting left ankle-brachial index is within normal range. No  evidence of significant left lower extremity arterial disease. The left  toe-brachial index is normal.   EKG:  EKG is  ordered today.  The ekg ordered today demonstrates NSR, 64bpm, nonspecific T wave changes  Recent  Labs: 01/06/2021: Magnesium 2.0; TSH 1.24 07/12/2021: ALT 13; BUN 20; Creatinine, Ser 0.76; Hemoglobin 13.0; Platelets 183; Potassium 3.9; Sodium 134  Recent Lipid Panel    Component Value Date/Time   CHOL 236 (H) 04/26/2020 1503   TRIG 80.0 04/26/2020 1503   HDL 73.10 04/26/2020 1503   CHOLHDL 3 04/26/2020 1503   VLDL 16.0 04/26/2020 1503   LDLCALC 147 (H) 04/26/2020 1503   LDLDIRECT 158.5 11/04/2012 1105     Physical Exam:    VS:  BP 114/80 (BP Location: Left Arm, Patient Position: Sitting, Cuff Size: Normal)   Pulse 64   Ht 5\' 2"  (1.575 m)   Wt 133 lb (60.3 kg)   SpO2 98%   BMI 24.33 kg/m     Wt Readings from Last 3 Encounters:  09/20/21 133 lb (60.3 kg)  07/12/21 133 lb 11.2 oz (60.6 kg)  06/27/21 134 lb (60.8 kg)     GEN:  Well nourished, well developed in no acute distress HEENT: Normal NECK: No JVD; No carotid bruits LYMPHATICS: No lymphadenopathy CARDIAC: RRR, no murmurs, rubs, gallops RESPIRATORY:  Clear to auscultation without rales, wheezing or rhonchi  ABDOMEN: Soft, non-tender, non-distended MUSCULOSKELETAL:  No edema; No deformity  SKIN: Warm and dry NEUROLOGIC:  Alert and oriented x 3 PSYCHIATRIC:  Normal affect   ASSESSMENT:    1. Dizziness   2. Murmur   3. Mild aortic stenosis   4. Essential hypertension   5. SVT (supraventricular tachycardia) ([place])   6. PVC (premature ventricular contraction)   7. Pain in both lower extremities    PLAN:    In order of problems listed above:  Dizziness/Imbalance Reports general imbalance and some dizziness. Dizziness sometimes worse with changing positions. BP today is good. Orthostatics negative in the office, but does have some dizziness from laying to sitting. She will see her PCP and wants to request a referral to therapy for balance. Recommend good hydration. She is on HCTZ, lisinopril, and Lopressor. If dizziness continues consider stopping HCTZ.   PVC/PAC/PSVT Seen on heart monitor 03/2021. Atenolol  previouly changes to metoprolol, this is controlling symptoms.    Mild AS By echo normal LVEF and mild AS. Can follow with serial echo.    Chronic anticoagulation History of PE, continue warfarin   Hypertension Bp good today. Continue Lopressor, lisinopril, and HCTZ  Leg pain ABIs without significant disease. She denies any further claudication symptoms. Continue Aspirin.    Disposition: Follow up in 6 month(s) with MD/APP     Signed, Abir Eroh Ninfa Meeker, PA-C  09/20/2021 4:28 PM    [facility]

## 2021-09-20 ENCOUNTER — Encounter: Payer: Self-pay | Admitting: Medical

## 2021-09-20 ENCOUNTER — Other Ambulatory Visit: Payer: Self-pay

## 2021-09-20 ENCOUNTER — Ambulatory Visit (INDEPENDENT_AMBULATORY_CARE_PROVIDER_SITE_OTHER): Payer: Medicare Other | Admitting: Medical

## 2021-09-20 VITALS — BP 114/80 | HR 64 | Ht 62.0 in | Wt 133.0 lb

## 2021-09-20 DIAGNOSIS — I10 Essential (primary) hypertension: Secondary | ICD-10-CM

## 2021-09-20 DIAGNOSIS — M79605 Pain in left leg: Secondary | ICD-10-CM | POA: Diagnosis not present

## 2021-09-20 DIAGNOSIS — I471 Supraventricular tachycardia: Secondary | ICD-10-CM | POA: Diagnosis not present

## 2021-09-20 DIAGNOSIS — I493 Ventricular premature depolarization: Secondary | ICD-10-CM | POA: Diagnosis not present

## 2021-09-20 DIAGNOSIS — R42 Dizziness and giddiness: Secondary | ICD-10-CM

## 2021-09-20 DIAGNOSIS — I350 Nonrheumatic aortic (valve) stenosis: Secondary | ICD-10-CM

## 2021-09-20 DIAGNOSIS — R011 Cardiac murmur, unspecified: Secondary | ICD-10-CM

## 2021-09-20 DIAGNOSIS — M79604 Pain in right leg: Secondary | ICD-10-CM | POA: Diagnosis not present

## 2021-09-20 NOTE — Patient Instructions (Signed)
Medication Instructions:  No changes at this time.  *If you need a refill on your cardiac medications before your next appointment, please call your pharmacy*   Lab Work: None  If you have labs (blood work) drawn today and your tests are completely normal, you will receive your results only by: MyChart Message (if you have MyChart) OR A paper copy in the mail If you have any lab test that is abnormal or we need to change your treatment, we will call you to review the results.   Testing/Procedures: None   Follow-Up: At [facility], you and your health needs are our priority.  As part of our continuing mission to provide you with exceptional heart care, we have created designated Provider Care Teams.  These Care Teams include your primary Cardiologist (physician) and Advanced Practice Providers (APPs -  Physician Assistants and Nurse Practitioners) who all work together to provide you with the care you need, when you need it.   Your next appointment:   6 month(s)  The format for your next appointment:   In Person  Provider:   You may see Muhammad Arida, MD or one of the following Advanced Practice Providers on your designated Care Team:   Christopher Berge, NP Ryan Dunn, PA-C Jacquelyn Visser, PA-C Cadence Furth, PA-C 

## 2021-09-28 ENCOUNTER — Ambulatory Visit (INDEPENDENT_AMBULATORY_CARE_PROVIDER_SITE_OTHER): Payer: Medicare Other | Admitting: Podiatry

## 2021-09-28 ENCOUNTER — Other Ambulatory Visit: Payer: Self-pay

## 2021-09-28 ENCOUNTER — Encounter: Payer: Self-pay | Admitting: Podiatry

## 2021-09-28 DIAGNOSIS — D689 Coagulation defect, unspecified: Secondary | ICD-10-CM | POA: Diagnosis not present

## 2021-09-28 DIAGNOSIS — B351 Tinea unguium: Secondary | ICD-10-CM | POA: Diagnosis not present

## 2021-09-28 DIAGNOSIS — M79609 Pain in unspecified limb: Secondary | ICD-10-CM | POA: Diagnosis not present

## 2021-09-28 NOTE — Progress Notes (Signed)
This patient returns to my office for at risk foot care.  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  This patient is unable to cut nails herself since the patient cannot reach her nails.These nails are painful walking and wearing shoes.  This patient presents for at risk foot care today.  General Appearance  Alert, conversant and in no acute stress.  Vascular  Dorsalis pedis and posterior tibial  pulses are palpable  bilaterally.  Capillary return is within normal limits  bilaterally. Temperature is within normal limits  bilaterally.  Neurologic  Senn-Weinstein monofilament wire test within normal limits  bilaterally. Muscle power within normal limits bilaterally.  Nails Thick disfigured discolored nails with subungual debris  from hallux to fifth toes bilaterally. No evidence of bacterial infection or drainage bilaterally.  Orthopedic  No limitations of motion  feet .  No crepitus or effusions noted.  No bony pathology or digital deformities noted.  Skin  normotropic skin with no porokeratosis noted bilaterally.  No signs of infections or ulcers noted.     Onychomycosis  Pain in right toes  Pain in left toes  Consent was obtained for treatment procedures.   Mechanical debridement of nails 1-5  bilaterally performed with a nail nipper.  Filed with dremel without incident.    Return office visit    3 months                  Told patient to return for periodic foot care and evaluation due to potential at risk complications.   Gardiner Barefoot DPM

## 2021-10-02 DIAGNOSIS — Z7901 Long term (current) use of anticoagulants: Secondary | ICD-10-CM | POA: Diagnosis not present

## 2021-10-02 DIAGNOSIS — Z86718 Personal history of other venous thrombosis and embolism: Secondary | ICD-10-CM | POA: Diagnosis not present

## 2021-10-02 DIAGNOSIS — Z86711 Personal history of pulmonary embolism: Secondary | ICD-10-CM | POA: Diagnosis not present

## 2021-10-02 DIAGNOSIS — D802 Selective deficiency of immunoglobulin A [IgA]: Secondary | ICD-10-CM | POA: Diagnosis not present

## 2021-10-02 LAB — POCT INR: INR: 1.7 — AB (ref 2.0–3.0)

## 2021-10-04 ENCOUNTER — Ambulatory Visit (INDEPENDENT_AMBULATORY_CARE_PROVIDER_SITE_OTHER): Payer: Medicare Other

## 2021-10-04 DIAGNOSIS — Z7901 Long term (current) use of anticoagulants: Secondary | ICD-10-CM

## 2021-10-04 NOTE — Progress Notes (Signed)
Increase dose today to 7.5mg  and then continue to take 5 mg daily EXCEPT take 7.5 mg on Mondays and Thursdays. Re-check in four weeks,10/30/21.

## 2021-10-04 NOTE — Patient Instructions (Addendum)
Pre visit review using our clinic review tool, if applicable. No additional management support is needed unless otherwise documented below in the visit note.  Increase dose today to 7.5mg  and then continue to take 5 mg daily EXCEPT take 7.5 mg on Mondays and Thursdays. Re-check in four weeks,10/30/21.

## 2021-10-09 ENCOUNTER — Other Ambulatory Visit: Payer: Self-pay

## 2021-10-09 ENCOUNTER — Ambulatory Visit (INDEPENDENT_AMBULATORY_CARE_PROVIDER_SITE_OTHER): Payer: Medicare Other | Admitting: Internal Medicine

## 2021-10-09 ENCOUNTER — Encounter: Payer: Self-pay | Admitting: Internal Medicine

## 2021-10-09 VITALS — BP 138/80 | HR 74 | Temp 97.3°F | Resp 17 | Ht 62.0 in | Wt 135.4 lb

## 2021-10-09 DIAGNOSIS — R202 Paresthesia of skin: Secondary | ICD-10-CM | POA: Diagnosis not present

## 2021-10-09 DIAGNOSIS — E7800 Pure hypercholesterolemia, unspecified: Secondary | ICD-10-CM

## 2021-10-09 DIAGNOSIS — M810 Age-related osteoporosis without current pathological fracture: Secondary | ICD-10-CM

## 2021-10-09 DIAGNOSIS — E559 Vitamin D deficiency, unspecified: Secondary | ICD-10-CM | POA: Diagnosis not present

## 2021-10-09 DIAGNOSIS — R7309 Other abnormal glucose: Secondary | ICD-10-CM | POA: Diagnosis not present

## 2021-10-09 DIAGNOSIS — R2689 Other abnormalities of gait and mobility: Secondary | ICD-10-CM | POA: Diagnosis not present

## 2021-10-09 DIAGNOSIS — Z86718 Personal history of other venous thrombosis and embolism: Secondary | ICD-10-CM

## 2021-10-09 DIAGNOSIS — Z86711 Personal history of pulmonary embolism: Secondary | ICD-10-CM | POA: Diagnosis not present

## 2021-10-09 DIAGNOSIS — I10 Essential (primary) hypertension: Secondary | ICD-10-CM

## 2021-10-09 DIAGNOSIS — G459 Transient cerebral ischemic attack, unspecified: Secondary | ICD-10-CM | POA: Diagnosis not present

## 2021-10-09 NOTE — Progress Notes (Signed)
Subjective:    Patient ID: Becky Farrell, female    DOB: 11-09-1934, 87 y.o.   MRN: [id number]  HPI  Pt presents to the clinic for 6 month follow up of chronic conditions.   HLD with Hx of TIA: Her last LDL was 147, triglycerides 80, 04/2020. No residual effect. She is taking Atenolol, HCTZ, Lisinopril and ASA as prescribed. She does not follow with neurology.  Hx of DVT/PE: On lifelong Coumadin. She no longer follows with the Coumadin Clinic.  Osteoporosis: Managed with Prolia injections. Bone density from 08/2020 reviewed.  HTN: Her BP today is 138/80. She is taking Atenolol, HCTZ, Lisinopril as prescribed.  Cardiology was want to see if she could come off the HCTZ as they feel like this may be related to her persistent dizziness.  ECG from 09/2018 reviewed.  Dizziness: She reports this is interfering with her gait and balance.  She would like a referral to physical therapy at [facility] for further evaluation.  Review of Systems     Past Medical History:  Diagnosis Date   Colon polyps    Family history of breast cancer    History of pulmonary embolism 2011   Hx of melanoma of skin 2003   Right leg   Hypertension    Melanoma ([place]) 2003   right lower leg. [place] in [place].   Personal history of radiation therapy    Seizures ([place]) 1993   no meds since (approx) 2014   Vertigo    x1. R/T perforated ear drum prior to 2013    Current Outpatient Medications  Medication Sig Dispense Refill   aspirin 81 MG tablet Take 81 mg by mouth daily.     Cholecalciferol (VITAMIN D3) 2000 UNITS TABS Take 2,000 Units by mouth daily.      denosumab (PROLIA) 60 MG/ML SOSY injection Inject 60 mg into the skin every 6 (six) months.     hydrochlorothiazide (MICROZIDE) 12.5 MG capsule TAKE 1 CAPSULE BY MOUTH EVERY DAY 90 capsule 1   ketoconazole (NIZORAL) 2 % cream Apply to rash under breasts 2 times a day until rash improved. 60 g 2   letrozole (FEMARA) 2.5 MG tablet TAKE 1 TABLET BY MOUTH EVERY  DAY 90 tablet 3   lisinopril (ZESTRIL) 10 MG tablet TAKE 1 TABLET BY MOUTH EVERY DAY 90 tablet 1   metoprolol tartrate (LOPRESSOR) 25 MG tablet TAKE 1 TABLET BY MOUTH TWICE A DAY 180 tablet 2   polyethylene glycol (MIRALAX / GLYCOLAX) packet Take 17 g by mouth every other day.     warfarin (COUMADIN) 5 MG tablet TAKE ONE TABLET BY MOUTH DAILY EXCEPT TAKE 7.5MG  ON MONDAYS AND THURSDAYS OR AS DIRECTED BY COUMADIN CLINIC 105 tablet 1   warfarin (COUMADIN) 7.5 MG tablet Take one tablet, 7.5mg ,  by mouth on Mondays and Thursday or as directed by anticoagulation clinic. 70 tablet 1   warfarin (COUMADIN) 7.5 MG tablet Take one tablet, 7.5mg ,  by mouth on Mondays and Thursdays or as directed by anticoagulation clinic. 70 tablet 1   Wheat Dextrin (BENEFIBER PO) Take 2 each by mouth See admin instructions. Mix 2 teaspoons and drink 3 times daily     No current facility-administered medications for this visit.    Allergies  Allergen Reactions   Clobetasol Other (See Comments)    vertigo   Clobetasol Propionate Other (See Comments)    This is ointment form UNSPECIFIED REACTION    Phenergan [Promethazine Hcl] Other (See Comments)  Rapid heart rate   Dextromethorphan Palpitations and Other (See Comments)    promethazine with DM, rapid hearbeat   Zithromax [Azithromycin] Palpitations and Other (See Comments)    Per OSH report, pt unable to recall Rapid heart rate    Family History  Problem Relation Age of Onset   Heart disease Mother    Aortic aneurysm Father    Breast cancer Daughter 33   Breast cancer Niece 70    Social History   Socioeconomic History   Marital status: Married    Spouse name: Not on file   Number of children: Not on file   Years of education: Not on file   Highest education level: Not on file  Occupational History   Not on file  Tobacco Use   Smoking status: Never   Smokeless tobacco: Never  Vaping Use   Vaping Use: Never used  Substance and Sexual Activity    Alcohol use: No   Drug use: Never   Sexual activity: Never  Other Topics Concern   Not on file  Social History Narrative   Recently moved from [place] to [facility].   Daughter and grandchildren live in [place].   Has other children in [place].   Retired Pharmacist, hospital.      Husband has been recovering from Prostate CA.   Social Determinants of Health   Financial Resource Strain: Not on file  Food Insecurity: Not on file  Transportation Needs: Not on file  Physical Activity: Not on file  Stress: Not on file  Social Connections: Not on file  Intimate Partner Violence: Not on file     Constitutional: Denies fever, malaise, fatigue, headache or abrupt weight changes.  HEENT: Denies eye pain, eye redness, ear pain, ringing in the ears, wax buildup, runny nose, nasal congestion, bloody nose, or sore throat. Respiratory: Denies difficulty breathing, shortness of breath, cough or sputum production.   Cardiovascular: Denies chest pain, chest tightness, palpitations or swelling in the hands or feet.  Gastrointestinal: Denies abdominal pain, bloating, constipation, diarrhea or blood in the stool.  GU: Denies urgency, frequency, pain with urination, burning sensation, blood in urine, odor or discharge. Musculoskeletal: Patient reports difficulty with gait.  Denies decrease in range of motion, muscle pain or joint pain and swelling.  Skin: Denies redness, rashes, lesions or ulcercations.  Neurological: Patient reports difficulty with balance, dizziness, paresthesias of upper and lower extremities.  Denies difficulty with memory, difficulty with speech or problems with coordination.  Psych: Denies anxiety, depression, SI/HI.  No other specific complaints in a complete review of systems (except as listed in HPI above).  Objective:   Physical Exam  BP 138/80 (BP Location: Left Arm, Patient Position: Sitting, Cuff Size: Normal)   Pulse 74   Temp (!) 97.3 F (36.3 C) (Temporal)   Resp 17   Ht 5'  2" (1.575 m)   Wt 135 lb 6.4 oz (61.4 kg)   SpO2 99%   BMI 24.76 kg/m   Wt Readings from Last 3 Encounters:  09/20/21 133 lb (60.3 kg)  07/12/21 133 lb 11.2 oz (60.6 kg)  06/27/21 134 lb (60.8 kg)    General: Appears her stated age, well developed, well nourished in NAD. Skin: Warm, dry and intact.  HEENT: Head: normal shape and size; Eyes: EOMs intact;  Cardiovascular: Normal rate and rhythm. S1,S2 noted.  No murmur, rubs or gallops noted. No JVD or BLE edema. No carotid bruits noted. Pulmonary/Chest: Normal effort and positive vesicular breath sounds. No respiratory  distress. No wheezes, rales or ronchi noted.  Musculoskeletal: Gait slow and slightly unsteady without use of assistive device. Neurological: Alert and oriented.  Psychiatric: Mood and affect normal. Behavior is normal. Judgment and thought content normal.    BMET    Component Value Date/Time   NA 134 (L) 07/12/2021 1305   NA 140 04/22/2013 0237   K 3.9 07/12/2021 1305   K 3.6 04/22/2013 0237   CL 102 07/12/2021 1305   CL 106 04/22/2013 0237   CO2 25 07/12/2021 1305   CO2 29 04/22/2013 0237   GLUCOSE 159 (H) 07/12/2021 1305   GLUCOSE 88 04/22/2013 0237   BUN 20 07/12/2021 1305   BUN 26 (H) 04/22/2013 0237   CREATININE 0.76 07/12/2021 1305   CREATININE 0.74 01/01/2014 1556   CALCIUM 9.4 07/12/2021 1305   CALCIUM 9.7 04/22/2013 0237   GFRNONAA >60 07/12/2021 1305   GFRNONAA >60 04/22/2013 0237   GFRAA >60 08/10/2020 1025   GFRAA >60 04/22/2013 0237    Lipid Panel     Component Value Date/Time   CHOL 236 (H) 04/26/2020 1503   TRIG 80.0 04/26/2020 1503   HDL 73.10 04/26/2020 1503   CHOLHDL 3 04/26/2020 1503   VLDL 16.0 04/26/2020 1503   LDLCALC 147 (H) 04/26/2020 1503    CBC    Component Value Date/Time   WBC 5.4 07/12/2021 1305   RBC 3.99 07/12/2021 1305   HGB 13.0 07/12/2021 1305   HGB 13.8 05/05/2014 1218   HCT 38.5 07/12/2021 1305   HCT 36.4 05/12/2014 0628   PLT 183 07/12/2021 1305    PLT 163 05/05/2014 1218   MCV 96.5 07/12/2021 1305   MCV 97 05/05/2014 1218   MCH 32.6 07/12/2021 1305   MCHC 33.8 07/12/2021 1305   RDW 12.6 07/12/2021 1305   RDW 13.6 05/05/2014 1218   LYMPHSABS 1.1 07/12/2021 1305   MONOABS 0.4 07/12/2021 1305   EOSABS 0.1 07/12/2021 1305   BASOSABS 0.0 07/12/2021 1305    Hgb A1C Lab Results  Component Value Date   HGBA1C 5.6 01/06/2021            Assessment & Plan:   Difficulty with Gait, Difficulty with Balance secondary to Dizziness:  Referral to PT for further evaluation treatment Will DC HCTZ per cardiology recommendation  Paresthesia of upper and lower extremities:  We will check TSH,, A1c vitamin D and B12  RTC in 6 months for your Medicare wellness exam [name], NP This visit occurred during the SARS-CoV-2 public health emergency.  Safety protocols were in place, including screening questions prior to the visit, additional usage of staff PPE, and extensive cleaning of exam room while observing appropriate contact time as indicated for disinfecting solutions.

## 2021-10-10 LAB — HEMOGLOBIN A1C
Hgb A1c MFr Bld: 5.3 % of total Hgb (ref <5.7)
Mean Plasma Glucose: 105 mg/dL
eAG (mmol/L): 5.8 mmol/L

## 2021-10-10 LAB — VITAMIN B12: Vitamin B-12: 298 pg/mL (ref 200–1100)

## 2021-10-10 LAB — LIPID PANEL
Cholesterol: 215 mg/dL — ABNORMAL HIGH (ref <200)
HDL: 78 mg/dL (ref >=50)
LDL Cholesterol (Calc): 119 mg/dL (calc) — ABNORMAL HIGH
Non-HDL Cholesterol (Calc): 137 mg/dL (calc) — ABNORMAL HIGH (ref <130)
Total CHOL/HDL Ratio: 2.8 (calc) (ref <5.0)
Triglycerides: 82 mg/dL (ref <150)

## 2021-10-10 LAB — TSH: TSH: 1.11 mIU/L (ref 0.40–4.50)

## 2021-10-10 LAB — VITAMIN D 25 HYDROXY (VIT D DEFICIENCY, FRACTURES): Vit D, 25-Hydroxy: 61 ng/mL (ref 30–100)

## 2021-10-13 DIAGNOSIS — H7202 Central perforation of tympanic membrane, left ear: Secondary | ICD-10-CM | POA: Diagnosis not present

## 2021-10-13 DIAGNOSIS — H6123 Impacted cerumen, bilateral: Secondary | ICD-10-CM | POA: Diagnosis not present

## 2021-10-13 DIAGNOSIS — E7800 Pure hypercholesterolemia, unspecified: Secondary | ICD-10-CM | POA: Insufficient documentation

## 2021-10-13 DIAGNOSIS — H90A32 Mixed conductive and sensorineural hearing loss, unilateral, left ear with restricted hearing on the contralateral side: Secondary | ICD-10-CM | POA: Diagnosis not present

## 2021-10-13 NOTE — Assessment & Plan Note (Signed)
Discuss changing her Coumadin to Eliquis or Xarelto, she is hesitant to do this at this time

## 2021-10-13 NOTE — Patient Instructions (Signed)

## 2021-10-13 NOTE — Assessment & Plan Note (Signed)
Continue Prolia injections Continue daily weightbearing exercise

## 2021-10-13 NOTE — Assessment & Plan Note (Addendum)
Lipid profile today Encourage low-fat diet

## 2021-10-13 NOTE — Assessment & Plan Note (Signed)
No residual effect Currently not on statin therapy Encouraged her to consume a low-fat diet Continue aspirin

## 2021-10-13 NOTE — Assessment & Plan Note (Signed)
Will D/C HCTZ per cardiology recommendation Continue Atenolol and Lisinopril Reinforced DASH diet

## 2021-10-16 ENCOUNTER — Other Ambulatory Visit: Payer: Self-pay

## 2021-10-16 ENCOUNTER — Ambulatory Visit: Payer: Self-pay | Admitting: *Deleted

## 2021-10-16 ENCOUNTER — Encounter: Payer: Self-pay | Admitting: Internal Medicine

## 2021-10-16 ENCOUNTER — Ambulatory Visit (INDEPENDENT_AMBULATORY_CARE_PROVIDER_SITE_OTHER): Payer: Medicare Other | Admitting: Internal Medicine

## 2021-10-16 VITALS — BP 163/84 | HR 84 | Temp 97.8°F | Resp 17 | Ht 62.0 in | Wt 135.8 lb

## 2021-10-16 DIAGNOSIS — M7989 Other specified soft tissue disorders: Secondary | ICD-10-CM | POA: Diagnosis not present

## 2021-10-16 DIAGNOSIS — M79602 Pain in left arm: Secondary | ICD-10-CM

## 2021-10-16 DIAGNOSIS — M2540 Effusion, unspecified joint: Secondary | ICD-10-CM

## 2021-10-16 MED ORDER — PREDNISONE 10 MG PO TABS: ORAL_TABLET | ORAL | 0 refills | Status: DC

## 2021-10-16 MED ORDER — METHYLPREDNISOLONE ACETATE 80 MG/ML IJ SUSP
80.0000 mg | Freq: Once | INTRAMUSCULAR | Status: AC
  Administered 2021-10-16: 80 mg via INTRAMUSCULAR

## 2021-10-16 NOTE — Progress Notes (Signed)
Subjective:    Patient ID: Becky Farrell, female    DOB: [birth year], 87 y.o.   MRN: [id number]  HPI  Pt presents to the clinic today with c/o pain and swelling of her left hand. She noticed this 3 days ago. The swelling started in her knuckles and has extended into her forearm and wrist. She reports some redness but denies warmth. She describes the pain as sharp and shooting, radiating up to her elbow. She reports associated weakness but denies numbness or tingling. She denies any fall or injury to the area. She stopped HCTZ 1 week ago and is not sure if this is a contributing factor. She has put her arm in a sling with minimal relief of symptoms.  Review of Systems  Past Medical History:  Diagnosis Date   Colon polyps    Family history of breast cancer    History of pulmonary embolism 2011   Hx of melanoma of skin 2003   Right leg   Hypertension    Melanoma ([place]) 2003   right lower leg. Treated in [place].   Personal history of radiation therapy    Seizures ([place]) 1993   no meds since (approx) 2014   Vertigo    x1. R/T perforated ear drum prior to 2013    Current Outpatient Medications  Medication Sig Dispense Refill   aspirin 81 MG tablet Take 81 mg by mouth daily.     Cholecalciferol (VITAMIN D3) 2000 UNITS TABS Take 2,000 Units by mouth daily.      denosumab (PROLIA) 60 MG/ML SOSY injection Inject 60 mg into the skin every 6 (six) months.     ketoconazole (NIZORAL) 2 % cream Apply to rash under breasts 2 times a day until rash improved. 60 g 2   letrozole (FEMARA) 2.5 MG tablet TAKE 1 TABLET BY MOUTH EVERY DAY 90 tablet 3   lisinopril (ZESTRIL) 10 MG tablet TAKE 1 TABLET BY MOUTH EVERY DAY 90 tablet 1   metoprolol tartrate (LOPRESSOR) 25 MG tablet TAKE 1 TABLET BY MOUTH TWICE A DAY 180 tablet 2   polyethylene glycol (MIRALAX / GLYCOLAX) packet Take 17 g by mouth every other day.     warfarin (COUMADIN) 5 MG tablet TAKE ONE TABLET BY MOUTH DAILY EXCEPT TAKE 7.5MG  ON MONDAYS AND  THURSDAYS OR AS DIRECTED BY COUMADIN CLINIC 105 tablet 1   warfarin (COUMADIN) 7.5 MG tablet Take one tablet, 7.5mg ,  by mouth on Mondays and Thursday or as directed by anticoagulation clinic. 70 tablet 1   warfarin (COUMADIN) 7.5 MG tablet Take one tablet, 7.5mg ,  by mouth on Mondays and Thursdays or as directed by anticoagulation clinic. 70 tablet 1   Wheat Dextrin (BENEFIBER PO) Take 2 each by mouth See admin instructions. Mix 2 teaspoons and drink 3 times daily     No current facility-administered medications for this visit.    Allergies  Allergen Reactions   Clobetasol Other (See Comments)    vertigo   Clobetasol Propionate Other (See Comments)    This is ointment form UNSPECIFIED REACTION    Phenergan [Promethazine Hcl] Other (See Comments)    Rapid heart rate   Dextromethorphan Palpitations and Other (See Comments)    promethazine with DM, rapid hearbeat   Zithromax [Azithromycin] Palpitations and Other (See Comments)    Per OSH report, pt unable to recall Rapid heart rate    Family History  Problem Relation Age of Onset   Heart disease Mother    Aortic aneurysm  Father    Breast cancer Daughter 56   Breast cancer Niece 62    Social History   Socioeconomic History   Marital status: Married    Spouse name: Not on file   Number of children: Not on file   Years of education: Not on file   Highest education level: Not on file  Occupational History   Not on file  Tobacco Use   Smoking status: Never   Smokeless tobacco: Never  Vaping Use   Vaping Use: Never used  Substance and Sexual Activity   Alcohol use: No   Drug use: Never   Sexual activity: Never  Other Topics Concern   Not on file  Social History Narrative   Recently moved from [place] to [facility].   Daughter and grandchildren live in [place].   Has other children in [place].   Retired Pharmacist, hospital.      Husband has been recovering from Prostate CA.   Social Determinants of Health   Financial Resource  Strain: Not on file  Food Insecurity: Not on file  Transportation Needs: Not on file  Physical Activity: Not on file  Stress: Not on file  Social Connections: Not on file  Intimate Partner Violence: Not on file     Constitutional: Denies fever, malaise, fatigue, headache or abrupt weight changes.  Respiratory: Denies difficulty breathing, shortness of breath, cough or sputum production.   Cardiovascular: Denies chest pain, chest tightness, palpitations or swelling in the hands or feet.  Musculoskeletal: Pt reports pain and swelling of her left arm. Denies difficulty with gait, muscle pain.  Skin: Denies redness, rashes, lesions or ulcercations.  Neurological: Denies numbness, tingling, problems with balance and coordination.    No other specific complaints in a complete review of systems (except as listed in HPI above).     Objective:   Physical Exam BP (!) 163/84 (BP Location: Right Arm, Patient Position: Sitting, Cuff Size: Normal)   Pulse 84   Temp 97.8 F (36.6 C) (Temporal)   Resp 17   Ht 5\' 2"  (1.575 m)   Wt 135 lb 12.8 oz (61.6 kg)   SpO2 100%   BMI 24.84 kg/m   Wt Readings from Last 3 Encounters:  10/09/21 135 lb 6.4 oz (61.4 kg)  09/20/21 133 lb (60.3 kg)  07/12/21 133 lb 11.2 oz (60.6 kg)    General: Appears her stated age, well developed, well nourished in NAD. Skin: Warm, dry and intact. Redness noted over there 2nd/3rd knuckles left hand. Cardiovascular: Normal rate and rhythm. S1,S2 noted.  No murmur, rubs or gallops noted.  Pulmonary/Chest: Normal effort and positive vesicular breath sounds. No respiratory distress. No wheezes, rales or ronchi noted.  Musculoskeletal: Normal flexion and rotation of the left elbow. Decreased extension secondary to pain. Decreased flexion, extension and rotation of the left wrist. 1+ swelling noted of the left hand and wrist. Hand grips unequal R>L. Neurological: Alert and oriented.   BMET    Component Value Date/Time    NA 134 (L) 07/12/2021 1305   NA 140 04/22/2013 0237   K 3.9 07/12/2021 1305   K 3.6 04/22/2013 0237   CL 102 07/12/2021 1305   CL 106 04/22/2013 0237   CO2 25 07/12/2021 1305   CO2 29 04/22/2013 0237   GLUCOSE 159 (H) 07/12/2021 1305   GLUCOSE 88 04/22/2013 0237   BUN 20 07/12/2021 1305   BUN 26 (H) 04/22/2013 0237   CREATININE 0.76 07/12/2021 1305   CREATININE 0.74 01/01/2014 1556  CALCIUM 9.4 07/12/2021 1305   CALCIUM 9.7 04/22/2013 0237   GFRNONAA >60 07/12/2021 1305   GFRNONAA >60 04/22/2013 0237   GFRAA >60 08/10/2020 1025   GFRAA >60 04/22/2013 0237    Lipid Panel     Component Value Date/Time   CHOL 215 (H) 10/09/2021 1406   TRIG 82 10/09/2021 1406   HDL 78 10/09/2021 1406   CHOLHDL 2.8 10/09/2021 1406   VLDL 16.0 04/26/2020 1503   LDLCALC 119 (H) 10/09/2021 1406    CBC    Component Value Date/Time   WBC 5.4 07/12/2021 1305   RBC 3.99 07/12/2021 1305   HGB 13.0 07/12/2021 1305   HGB 13.8 05/05/2014 1218   HCT 38.5 07/12/2021 1305   HCT 36.4 05/12/2014 0628   PLT 183 07/12/2021 1305   PLT 163 05/05/2014 1218   MCV 96.5 07/12/2021 1305   MCV 97 05/05/2014 1218   MCH 32.6 07/12/2021 1305   MCHC 33.8 07/12/2021 1305   RDW 12.6 07/12/2021 1305   RDW 13.6 05/05/2014 1218   LYMPHSABS 1.1 07/12/2021 1305   MONOABS 0.4 07/12/2021 1305   EOSABS 0.1 07/12/2021 1305   BASOSABS 0.0 07/12/2021 1305    Hgb A1C Lab Results  Component Value Date   HGBA1C 5.3 10/09/2021            Assessment & Plan:   Pain and Swelling of Left Hand/Wrist:  80 mg Depo Medrol IM today RX for Pred Taper x 6 days Encouraged elevation  Return precautions discussed  [name], NP This visit occurred during the SARS-CoV-2 public health emergency.  Safety protocols were in place, including screening questions prior to the visit, additional usage of staff PPE, and extensive cleaning of exam room while observing appropriate contact time as indicated for disinfecting  solutions.

## 2021-10-16 NOTE — Telephone Encounter (Signed)
Left arm swelling over the weekend. HCTZ was discontinued one week ago. Moderate amount of swelling and some pain. Denies SOB/fever. Stated she may have aggravated the arm messing with blinds at some point recently. Requesting appointment before noon. Call was disconnected by system. Tried to call her back 3x, left VM appointment scheduled for 3:40p today with [name].       Reason for Disposition  MODERATE arm swelling (e.g., puffiness or swollen feeling of entire arm)  Answer Assessment - Initial Assessment Questions 1. ONSET: "When did the swelling start?" (e.g., minutes, hours, days)     THursday 2. LOCATION: "What part of the arm is swollen?"  "Are both arms swollen or just one arm?"     All left arm 3. SEVERITY: "How bad is the swelling?" (e.g., localized; mild, moderate, severe)   - LOCALIZED: Small area of puffiness or swelling on just one arm   - JOINT SWELLING: Swelling of one joint   - MILD: Puffiness or swelling of hand   - MODERATE: Puffiness or swollen feeling of entire arm    - SEVRE: All of arm looks swollen; pitting edema     A lot of swelling 4. REDNESS: "Does the swelling look red or infected?"     fingers 5. PAIN: "Is the swelling painful to touch?" If Yes, ask: "How painful is it?"   (Scale 1-10; mild, moderate or severe)      6. FEVER: "Do you have a fever?" If Yes, ask: "What is it, how was it measured, and when did it start?"      no 7. CAUSE: "What do you think is causing the arm swelling?"     Off HCTZ 8. MEDICAL HISTORY: "Do you have a history of heart failure, kidney disease, liver failure, or cancer?"     yes 9. RECURRENT SYMPTOM: "Have you had arm swelling before?" If Yes, ask: "When was the last time?" "What happened that time?"     no 10. OTHER SYMPTOMS: "Do you have any other symptoms?" (e.g., chest pain, difficulty breathing)       no 11. PREGNANCY: "Is there any chance you are pregnant?" "When was your last menstrual period?"       na  Protocols  used: Arm Swelling and Edema-A-AH

## 2021-10-17 ENCOUNTER — Ambulatory Visit: Payer: Self-pay

## 2021-10-17 ENCOUNTER — Encounter: Payer: Self-pay | Admitting: Internal Medicine

## 2021-10-17 NOTE — Patient Instructions (Signed)
Arthritis Arthritis means joint pain. It can also mean joint disease. A joint is a place where bones come together. There are more than 100 types of arthritis. What are the causes? This condition may be caused by: Wear and tear of a joint. This is the most common cause. A lot of acid in the blood, which leads to pain in the joint (gout). Pain and swelling (inflammation) in a joint. Infection of a joint. Injuries in the joint. A reaction to medicines (allergy). In some cases, the cause may not be known. What are the signs or symptoms? Symptoms of this condition include: Redness at a joint. Swelling at a joint. Stiffness at a joint. Warmth coming from the joint. A fever. A feeling of being sick. How is this treated? This condition may be treated with: Treating the cause, if it is known. Rest. Raising (elevating) the joint. Putting cold or hot packs on the joint. Medicines to treat symptoms and reduce pain and swelling. Shots of medicines (cortisone) into the joint. You may also be told to make changes in your life, such as doing exercises and losing weight. Follow these instructions at home: Medicines Take over-the-counter and prescription medicines only as told by your doctor. Do not take aspirin for pain if your doctor says that you may have gout. Activity Rest your joint if your doctor tells you to. Avoid activities that make the pain worse. Exercise your joint regularly as told by your doctor. Try doing exercises like: Swimming. Water aerobics. Biking. Walking. Managing pain, stiffness, and swelling   If told, put ice on the affected area. Put ice in a plastic bag. Place a towel between your skin and the bag. Leave the ice on for 20 minutes, 2-3 times per day. If your joint is swollen, raise (elevate) it above the level of your heart if told by your doctor. If your joint feels stiff in the morning, try taking a warm shower. If told, put heat on the affected area. Do  this as often as told by your doctor. Use the heat source that your doctor recommends, such as a moist heat pack or a heating pad. If you have diabetes, do not apply heat without asking your doctor. To apply heat: Place a towel between your skin and the heat source. Leave the heat on for 20-30 minutes. Remove the heat if your skin turns bright red. This is very important if you are unable to feel pain, heat, or cold. You may have a greater risk of getting burned. General instructions Do not use any products that contain nicotine or tobacco, such as cigarettes, e-cigarettes, and chewing tobacco. If you need help quitting, ask your doctor. Keep all follow-up visits as told by your doctor. This is important. Contact a doctor if: The pain gets worse. You have a fever. Get help right away if: You have very bad pain in your joint. You have swelling in your joint. Your joint is red. Many joints become painful and swollen. You have very bad back pain. Your leg is very weak. You cannot control your pee (urine) or poop (stool). Summary Arthritis means joint pain. It can also mean joint disease. A joint is a place where bones come together. The most common cause of this condition is wear and tear of a joint. Symptoms of this condition include redness, swelling, or stiffness of the joint. This condition is treated with rest, raising the joint, medicines, and putting cold or hot packs on the joint. Follow your  doctor's instructions about medicines, activity, exercises, and other home care treatments. This information is not intended to replace advice given to you by your health care provider. Make sure you discuss any questions you have with your health care provider. Document Revised: 10/27/2018 Document Reviewed: 10/27/2018 Elsevier Patient Education  2022 [employer].

## 2021-10-17 NOTE — Telephone Encounter (Signed)
  I left a very detail message on the patient vm with the instruction on how to take the prednisone.

## 2021-10-17 NOTE — Telephone Encounter (Signed)
Pt called and stated that she would like some clinical advice regarding medication that she is starting this morning. Prednisone.     Pt. Asking if it is safe to take all her medications with the Prednisone, "especially my aspirin, Vit D, Mirilax and Bene fiber." Please advise pt.     Answer Assessment - Initial Assessment Questions 1. NAME of MEDICATION: "What medicine are you calling about?"     Prednisone 2. QUESTION: "What is your question?" (e.g., double dose of medicine, side effect)     Can I take my OTC 3. PRESCRIBING HCP: "Who prescribed it?" Reason: if prescribed by specialist, call should be referred to that group.     [place] 4. SYMPTOMS: "Do you have any symptoms?"     N/a 5. SEVERITY: If symptoms are present, ask "Are they mild, moderate or severe?"     N/a 6. PREGNANCY:  "Is there any chance that you are pregnant?" "When was your last menstrual period?"     No  Protocols used: Medication Question Call-A-AH

## 2021-10-18 DIAGNOSIS — R2689 Other abnormalities of gait and mobility: Secondary | ICD-10-CM | POA: Diagnosis not present

## 2021-10-18 DIAGNOSIS — M6281 Muscle weakness (generalized): Secondary | ICD-10-CM | POA: Diagnosis not present

## 2021-10-18 DIAGNOSIS — R278 Other lack of coordination: Secondary | ICD-10-CM | POA: Diagnosis not present

## 2021-10-19 ENCOUNTER — Telehealth: Payer: Self-pay | Admitting: Internal Medicine

## 2021-10-19 NOTE — Telephone Encounter (Signed)
Pt is calling to report that she has started the predniSONE (DELTASONE) 10 MG tablet [[id number]] Pt is on day 5 today. Pt reports the medication is working. The swelling has gone down.  Pt reports "she is singing a song." Please advise  CB- [phone]

## 2021-10-20 DIAGNOSIS — R2689 Other abnormalities of gait and mobility: Secondary | ICD-10-CM | POA: Diagnosis not present

## 2021-10-20 DIAGNOSIS — R278 Other lack of coordination: Secondary | ICD-10-CM | POA: Diagnosis not present

## 2021-10-20 DIAGNOSIS — M6281 Muscle weakness (generalized): Secondary | ICD-10-CM | POA: Diagnosis not present

## 2021-10-20 NOTE — Telephone Encounter (Signed)
That is great to hear.  I am glad it has helped.

## 2021-10-23 DIAGNOSIS — R2689 Other abnormalities of gait and mobility: Secondary | ICD-10-CM | POA: Diagnosis not present

## 2021-10-23 DIAGNOSIS — R278 Other lack of coordination: Secondary | ICD-10-CM | POA: Diagnosis not present

## 2021-10-23 DIAGNOSIS — M6281 Muscle weakness (generalized): Secondary | ICD-10-CM | POA: Diagnosis not present

## 2021-10-25 DIAGNOSIS — R278 Other lack of coordination: Secondary | ICD-10-CM | POA: Diagnosis not present

## 2021-10-25 DIAGNOSIS — R2689 Other abnormalities of gait and mobility: Secondary | ICD-10-CM | POA: Diagnosis not present

## 2021-10-25 DIAGNOSIS — M6281 Muscle weakness (generalized): Secondary | ICD-10-CM | POA: Diagnosis not present

## 2021-10-27 DIAGNOSIS — R2689 Other abnormalities of gait and mobility: Secondary | ICD-10-CM | POA: Diagnosis not present

## 2021-10-27 DIAGNOSIS — R278 Other lack of coordination: Secondary | ICD-10-CM | POA: Diagnosis not present

## 2021-10-27 DIAGNOSIS — M6281 Muscle weakness (generalized): Secondary | ICD-10-CM | POA: Diagnosis not present

## 2021-10-30 ENCOUNTER — Telehealth: Payer: Self-pay

## 2021-10-30 ENCOUNTER — Ambulatory Visit (INDEPENDENT_AMBULATORY_CARE_PROVIDER_SITE_OTHER): Payer: Medicare Other

## 2021-10-30 DIAGNOSIS — Z86718 Personal history of other venous thrombosis and embolism: Secondary | ICD-10-CM | POA: Diagnosis not present

## 2021-10-30 DIAGNOSIS — R2689 Other abnormalities of gait and mobility: Secondary | ICD-10-CM | POA: Diagnosis not present

## 2021-10-30 DIAGNOSIS — M6281 Muscle weakness (generalized): Secondary | ICD-10-CM | POA: Diagnosis not present

## 2021-10-30 DIAGNOSIS — R278 Other lack of coordination: Secondary | ICD-10-CM | POA: Diagnosis not present

## 2021-10-30 DIAGNOSIS — Z86711 Personal history of pulmonary embolism: Secondary | ICD-10-CM | POA: Diagnosis not present

## 2021-10-30 DIAGNOSIS — Z7901 Long term (current) use of anticoagulants: Secondary | ICD-10-CM

## 2021-10-30 LAB — POCT INR: INR: 2.6 (ref 2.0–3.0)

## 2021-10-30 NOTE — Progress Notes (Signed)
Continue to take 5 mg daily EXCEPT take 7.5 mg on Mondays and Thursdays. Re-check in four weeks,11/30/21.

## 2021-10-30 NOTE — Patient Instructions (Addendum)
Pre visit review using our clinic review tool, if applicable. No additional management support is needed unless otherwise documented below in the visit note.  Continue to take 5 mg daily EXCEPT take 7.5 mg on Mondays and Thursdays. Re-check in four weeks,11/30/21.

## 2021-10-30 NOTE — Telephone Encounter (Signed)
Noticed in pts chart that she has followed [name], NP, to her new clinic which is with [facility] and not within [place]. This coumadin nurse is only able to take care of pts that have a provider in [place]. Explained this to pt and advised this nurse would send a msg to [facility] to f/u with her concerning managing her warfarin going forward. Advised pt of dosing instructions for today's INR result.  Pt verbalized understanding and was very appreciative.

## 2021-10-31 NOTE — Telephone Encounter (Signed)
Appt scheduled for 11/07/2021

## 2021-10-31 NOTE — Telephone Encounter (Signed)
Can you call patient and have her set up an appointment to discuss her Coumadin.

## 2021-11-01 DIAGNOSIS — M6281 Muscle weakness (generalized): Secondary | ICD-10-CM | POA: Diagnosis not present

## 2021-11-01 DIAGNOSIS — R2689 Other abnormalities of gait and mobility: Secondary | ICD-10-CM | POA: Diagnosis not present

## 2021-11-01 DIAGNOSIS — R278 Other lack of coordination: Secondary | ICD-10-CM | POA: Diagnosis not present

## 2021-11-03 ENCOUNTER — Ambulatory Visit (INDEPENDENT_AMBULATORY_CARE_PROVIDER_SITE_OTHER): Payer: Medicare Other | Admitting: Internal Medicine

## 2021-11-03 ENCOUNTER — Encounter: Payer: Self-pay | Admitting: Internal Medicine

## 2021-11-03 DIAGNOSIS — J069 Acute upper respiratory infection, unspecified: Secondary | ICD-10-CM | POA: Diagnosis not present

## 2021-11-03 NOTE — Patient Instructions (Signed)

## 2021-11-03 NOTE — Progress Notes (Signed)
Virtual Visit via Telephone Note  I connected with Becky Farrell on 11/03/21 at  4:00 PM EST by telephone and verified that I am speaking with the correct person using two identifiers.  Location: Patient: Home Provider: Office  Patients participating in this Telephone visit: Becky Silversmith, NP and Becky Farrell   I discussed the limitations, risks, security and privacy concerns of performing an evaluation and management service by telephone and the availability of in person appointments. I also discussed with the patient that there may be a patient responsible charge related to this service. The patient expressed understanding and agreed to proceed.   History of Present Illness:  Pt reports fatigue, runny nose, cough and chest congestion. This started yesterday. The cough is productive of clear mucous. She denies headaches, nasal congestion, ear pain, sore throat, shortness of breath, chest pain, nausea, vomiting or diarrhea. She denies fever, chills or body aches. She has used Management consultant. She has not had sick contacts diagnosed with covid or flu that she is aware of. She has not tested for covid at this time.  Past Medical History:  Diagnosis Date   Colon polyps    Family history of breast cancer    History of pulmonary embolism 2011   Hx of melanoma of skin 2003   Right leg   Hypertension    Melanoma ([place]) 2003   right lower leg. [place] in [place].   Personal history of radiation therapy    Seizures ([place]) 1993   no meds since (approx) 2014   Vertigo    x1. R/T perforated ear drum prior to 2013    Current Outpatient Medications  Medication Sig Dispense Refill   aspirin 81 MG tablet Take 81 mg by mouth daily.     Cholecalciferol (VITAMIN D3) 2000 UNITS TABS Take 2,000 Units by mouth daily.      denosumab (PROLIA) 60 MG/ML SOSY injection Inject 60 mg into the skin every 6 (six) months.     ketoconazole (NIZORAL) 2 % cream Apply to rash under breasts 2 times a day until rash  improved. 60 g 2   letrozole (FEMARA) 2.5 MG tablet TAKE 1 TABLET BY MOUTH EVERY DAY 90 tablet 3   lisinopril (ZESTRIL) 10 MG tablet TAKE 1 TABLET BY MOUTH EVERY DAY 90 tablet 1   metoprolol tartrate (LOPRESSOR) 25 MG tablet TAKE 1 TABLET BY MOUTH TWICE A DAY 180 tablet 2   polyethylene glycol (MIRALAX / GLYCOLAX) packet Take 17 g by mouth every other day.     predniSONE (DELTASONE) 10 MG tablet Take 6 tabs on day 1, 5 tabs on day 2, 4 tabs on day 3, 3 tabs on day 4, 2 tabs on day 5, 1 tab on day 6 21 tablet 0   warfarin (COUMADIN) 5 MG tablet TAKE ONE TABLET BY MOUTH DAILY EXCEPT TAKE 7.5MG  ON MONDAYS AND THURSDAYS OR AS DIRECTED BY COUMADIN CLINIC 105 tablet 1   warfarin (COUMADIN) 7.5 MG tablet Take one tablet, 7.5mg ,  by mouth on Mondays and Thursday or as directed by anticoagulation clinic. 70 tablet 1   warfarin (COUMADIN) 7.5 MG tablet Take one tablet, 7.5mg ,  by mouth on Mondays and Thursdays or as directed by anticoagulation clinic. 70 tablet 1   Wheat Dextrin (BENEFIBER PO) Take 2 each by mouth See admin instructions. Mix 2 teaspoons and drink 3 times daily     No current facility-administered medications for this visit.    Allergies  Allergen Reactions  Clobetasol Other (See Comments)    vertigo   Clobetasol Propionate Other (See Comments)    This is ointment form UNSPECIFIED REACTION    Phenergan [Promethazine Hcl] Other (See Comments)    Rapid heart rate   Dextromethorphan Palpitations and Other (See Comments)    promethazine with DM, rapid hearbeat   Zithromax [Azithromycin] Palpitations and Other (See Comments)    Per OSH report, pt unable to recall Rapid heart rate    Family History  Problem Relation Age of Onset   Heart disease Mother    Aortic aneurysm Father    Breast cancer Daughter 17   Breast cancer Niece 20    Social History   Socioeconomic History   Marital status: Married    Spouse name: Not on file   Number of children: Not on file   Years of  education: Not on file   Highest education level: Not on file  Occupational History   Not on file  Tobacco Use   Smoking status: Never   Smokeless tobacco: Never  Vaping Use   Vaping Use: Never used  Substance and Sexual Activity   Alcohol use: No   Drug use: Never   Sexual activity: Never  Other Topics Concern   Not on file  Social History Narrative   Recently moved from [place] to [facility]   Daughter and grandchildren live in [place].   Has other children in [place].   Retired Pharmacist, hospital.      Husband has been recovering from Prostate CA.   Social Determinants of Health   Financial Resource Strain: Not on file  Food Insecurity: Not on file  Transportation Needs: Not on file  Physical Activity: Not on file  Stress: Not on file  Social Connections: Not on file  Intimate Partner Violence: Not on file     Constitutional: Pt reports fatigue. Denies fever, malaise, headache or abrupt weight changes.  HEENT: Pt reports runny nose. Denies eye pain, eye redness, ear pain, ringing in the ears, wax buildup, nasal congestion, bloody nose, or sore throat. Respiratory: Pt reports cough, shortness of breath. Denies difficulty breathing.   Cardiovascular: Denies chest pain, chest tightness, palpitations or swelling in the hands or feet.  Gastrointestinal: Denies abdominal pain, bloating, constipation, diarrhea or blood in the stool.   No other specific complaints in a complete review of systems (except as listed in HPI above).    Observations/Objective:   Wt Readings from Last 3 Encounters:  10/16/21 135 lb 12.8 oz (61.6 kg)  10/09/21 135 lb 6.4 oz (61.4 kg)  09/20/21 133 lb (60.3 kg)    General: In NAD. HEENT: Nose: no congestion noted; Throat/Mouth: hoarseness noted Pulmonary/Chest: Normal effort. No respiratory distress.  Neurological: Alert and oriented.   BMET    Component Value Date/Time   NA 134 (L) 07/12/2021 1305   NA 140 04/22/2013 0237   K 3.9 07/12/2021  1305   K 3.6 04/22/2013 0237   CL 102 07/12/2021 1305   CL 106 04/22/2013 0237   CO2 25 07/12/2021 1305   CO2 29 04/22/2013 0237   GLUCOSE 159 (H) 07/12/2021 1305   GLUCOSE 88 04/22/2013 0237   BUN 20 07/12/2021 1305   BUN 26 (H) 04/22/2013 0237   CREATININE 0.76 07/12/2021 1305   CREATININE 0.74 01/01/2014 1556   CALCIUM 9.4 07/12/2021 1305   CALCIUM 9.7 04/22/2013 0237   GFRNONAA >60 07/12/2021 1305   GFRNONAA >60 04/22/2013 0237   GFRAA >60 08/10/2020 1025   GFRAA >  60 04/22/2013 0237    Lipid Panel     Component Value Date/Time   CHOL 215 (H) 10/09/2021 1406   TRIG 82 10/09/2021 1406   HDL 78 10/09/2021 1406   CHOLHDL 2.8 10/09/2021 1406   VLDL 16.0 04/26/2020 1503   LDLCALC 119 (H) 10/09/2021 1406    CBC    Component Value Date/Time   WBC 5.4 07/12/2021 1305   RBC 3.99 07/12/2021 1305   HGB 13.0 07/12/2021 1305   HGB 13.8 05/05/2014 1218   HCT 38.5 07/12/2021 1305   HCT 36.4 05/12/2014 0628   PLT 183 07/12/2021 1305   PLT 163 05/05/2014 1218   MCV 96.5 07/12/2021 1305   MCV 97 05/05/2014 1218   MCH 32.6 07/12/2021 1305   MCHC 33.8 07/12/2021 1305   RDW 12.6 07/12/2021 1305   RDW 13.6 05/05/2014 1218   LYMPHSABS 1.1 07/12/2021 1305   MONOABS 0.4 07/12/2021 1305   EOSABS 0.1 07/12/2021 1305   BASOSABS 0.0 07/12/2021 1305    Hgb A1C Lab Results  Component Value Date   HGBA1C 5.3 10/09/2021       Assessment and Plan:  Viral URI with Cough:  Advised her to get tested for flu and COVID on Sunday if symptoms persist or worsen Start Mucinex 600 mg every 12 hours x3 days Start Delsym OTC as needed for cough Encourage rest and fluids  Notify me for new or worsening symptoms Follow Up Instructions:    I discussed the assessment and treatment plan with the patient. The patient was provided an opportunity to ask questions and all were answered. The patient agreed with the plan and demonstrated an understanding of the instructions.   The patient was  advised to call back or seek an in-person evaluation if the symptoms worsen or if the condition fails to improve as anticipated.  I provided 6:15 minutes of non-face-to-face time during this encounter.   Becky Silversmith, NP

## 2021-11-06 ENCOUNTER — Telehealth: Payer: Self-pay | Admitting: Internal Medicine

## 2021-11-06 NOTE — Telephone Encounter (Signed)
Pt is calling to report an update that she was advised to take Musinex and deslum. However, the patient in 2013 Pt took dextromethorphan and had a reaction. Pt hesitated in taking the medication. Pt took hot showers and the cough has continued. And in her chest. Pt is wanting a check of her lungs.  Pt is wanting Becky Farrell to check her lungs. Please advise CB-  [phone]

## 2021-11-06 NOTE — Telephone Encounter (Signed)
Patient calling in to reach [place] she states that [place] gave her a call and asked her to follow up before 5 today, she states that she had a visit with the independent nurse and these are her levels  Negative Covid  test Temp- 98.5 Pulse- 68 Blood pressure 160/92 Oxygen: 98% And she has congestion in her chest  Requesting a call back at [phone]

## 2021-11-07 ENCOUNTER — Ambulatory Visit: Payer: Medicare Other | Admitting: Internal Medicine

## 2021-11-07 NOTE — Telephone Encounter (Signed)
Pt. Calling back. Wants to be sure "Ms. Becky Farrell got my message yesterday. No one ever called me back." States she is "fine, I'm functional." Verified her appointment for 11/09/21.

## 2021-11-08 DIAGNOSIS — R278 Other lack of coordination: Secondary | ICD-10-CM | POA: Diagnosis not present

## 2021-11-08 DIAGNOSIS — R2689 Other abnormalities of gait and mobility: Secondary | ICD-10-CM | POA: Diagnosis not present

## 2021-11-08 DIAGNOSIS — M6281 Muscle weakness (generalized): Secondary | ICD-10-CM | POA: Diagnosis not present

## 2021-11-08 NOTE — Telephone Encounter (Signed)
I will recall receiving a message about t Ms. Mehan yesterday

## 2021-11-08 NOTE — Telephone Encounter (Signed)
The pt was notified that [name] seen her message and will discuss her concern on tomorrow at her appointment.

## 2021-11-09 ENCOUNTER — Encounter: Payer: Self-pay | Admitting: Internal Medicine

## 2021-11-09 ENCOUNTER — Ambulatory Visit (INDEPENDENT_AMBULATORY_CARE_PROVIDER_SITE_OTHER): Payer: Medicare Other | Admitting: Internal Medicine

## 2021-11-09 ENCOUNTER — Other Ambulatory Visit: Payer: Self-pay

## 2021-11-09 VITALS — BP 170/78 | HR 72 | Temp 97.9°F | Resp 18 | Ht 62.0 in | Wt 128.2 lb

## 2021-11-09 DIAGNOSIS — Z86718 Personal history of other venous thrombosis and embolism: Secondary | ICD-10-CM | POA: Diagnosis not present

## 2021-11-09 DIAGNOSIS — I10 Essential (primary) hypertension: Secondary | ICD-10-CM | POA: Diagnosis not present

## 2021-11-09 DIAGNOSIS — Z7901 Long term (current) use of anticoagulants: Secondary | ICD-10-CM

## 2021-11-09 DIAGNOSIS — Z86711 Personal history of pulmonary embolism: Secondary | ICD-10-CM

## 2021-11-09 NOTE — Progress Notes (Signed)
Subjective:    Patient ID: Becky Farrell, female    DOB: 1934/10/02, 87 y.o.   MRN: [id number]  HPI  Pt presents to the clinic today to discuss her Coumadin. She is taking this given her history of DVT and PE. She does not have any mechanical heart valve. She reports she has continued to take Coumadin because her INR has always been well controlled on this and it is affordable. She denies s/s of bleeding.  She also wants her blood pressure checked today. She reports it has been labile since stopping HCTZ. Her BP today is 166/85. She continues to take Lisinopril and Metoprolol.  Review of Systems     Past Medical History:  Diagnosis Date   Colon polyps    Family history of breast cancer    History of pulmonary embolism 2011   Hx of melanoma of skin 2003   Right leg   Hypertension    Melanoma ([place]) 2003   right lower leg. [place] in [place].   Personal history of radiation therapy    Seizures ([place]) 1993   no meds since (approx) 2014   Vertigo    x1. R/T perforated ear drum prior to 2013    Current Outpatient Medications  Medication Sig Dispense Refill   aspirin 81 MG tablet Take 81 mg by mouth daily.     Cholecalciferol (VITAMIN D3) 2000 UNITS TABS Take 2,000 Units by mouth daily.      denosumab (PROLIA) 60 MG/ML SOSY injection Inject 60 mg into the skin every 6 (six) months.     ketoconazole (NIZORAL) 2 % cream Apply to rash under breasts 2 times a day until rash improved. 60 g 2   letrozole (FEMARA) 2.5 MG tablet TAKE 1 TABLET BY MOUTH EVERY DAY 90 tablet 3   lisinopril (ZESTRIL) 10 MG tablet TAKE 1 TABLET BY MOUTH EVERY DAY 90 tablet 1   metoprolol tartrate (LOPRESSOR) 25 MG tablet TAKE 1 TABLET BY MOUTH TWICE A DAY 180 tablet 2   polyethylene glycol (MIRALAX / GLYCOLAX) packet Take 17 g by mouth every other day.     predniSONE (DELTASONE) 10 MG tablet Take 6 tabs on day 1, 5 tabs on day 2, 4 tabs on day 3, 3 tabs on day 4, 2 tabs on day 5, 1 tab on day 6 21 tablet 0    warfarin (COUMADIN) 5 MG tablet TAKE ONE TABLET BY MOUTH DAILY EXCEPT TAKE 7.5MG  ON MONDAYS AND THURSDAYS OR AS DIRECTED BY COUMADIN CLINIC 105 tablet 1   warfarin (COUMADIN) 7.5 MG tablet Take one tablet, 7.5mg ,  by mouth on Mondays and Thursday or as directed by anticoagulation clinic. 70 tablet 1   warfarin (COUMADIN) 7.5 MG tablet Take one tablet, 7.5mg ,  by mouth on Mondays and Thursdays or as directed by anticoagulation clinic. 70 tablet 1   Wheat Dextrin (BENEFIBER PO) Take 2 each by mouth See admin instructions. Mix 2 teaspoons and drink 3 times daily     No current facility-administered medications for this visit.    Allergies  Allergen Reactions   Clobetasol Other (See Comments)    vertigo   Clobetasol Propionate Other (See Comments)    This is ointment form UNSPECIFIED REACTION    Phenergan [Promethazine Hcl] Other (See Comments)    Rapid heart rate   Dextromethorphan Palpitations and Other (See Comments)    promethazine with DM, rapid hearbeat   Zithromax [Azithromycin] Palpitations and Other (See Comments)    Per OSH report,  pt unable to recall Rapid heart rate    Family History  Problem Relation Age of Onset   Heart disease Mother    Aortic aneurysm Father    Breast cancer Daughter 44   Breast cancer Niece 57    Social History   Socioeconomic History   Marital status: Married    Spouse name: Not on file   Number of children: Not on file   Years of education: Not on file   Highest education level: Not on file  Occupational History   Not on file  Tobacco Use   Smoking status: Never   Smokeless tobacco: Never  Vaping Use   Vaping Use: Never used  Substance and Sexual Activity   Alcohol use: No   Drug use: Never   Sexual activity: Never  Other Topics Concern   Not on file  Social History Narrative   Recently moved from [place] to [facility].   Daughter and grandchildren live in [place].   Has other children in [place].   Retired Pharmacist, hospital.       Husband has been recovering from Prostate CA.   Social Determinants of Health   Financial Resource Strain: Not on file  Food Insecurity: Not on file  Transportation Needs: Not on file  Physical Activity: Not on file  Stress: Not on file  Social Connections: Not on file  Intimate Partner Violence: Not on file     Constitutional: Denies fever, malaise, fatigue, headache or abrupt weight changes.  HEENT: Denies eye pain, eye redness, ear pain, ringing in the ears, wax buildup, runny nose, nasal congestion, bloody nose, or sore throat. Respiratory: Pt reports cough. Denies difficulty breathing, shortness of breath, or sputum production.   Cardiovascular: Denies chest pain, chest tightness, palpitations or swelling in the hands or feet.  Gastrointestinal: Denies abdominal pain, bloating, constipation, diarrhea or blood in the stool.  GU: Denies urgency, frequency, pain with urination, burning sensation, blood in urine, odor or discharge. Skin: Denies redness, rashes, lesions or ulcercations.   No other specific complaints in a complete review of systems (except as listed in HPI above).  Objective:   Physical Exam BP (!) 170/78 (BP Location: Left Arm, Patient Position: Sitting, Cuff Size: Normal)   Pulse 72   Temp 97.9 F (36.6 C) (Temporal)   Resp 18   Ht 5\' 2"  (1.575 m)   Wt 128 lb 3.2 oz (58.2 kg)   SpO2 99%   BMI 23.45 kg/m   Wt Readings from Last 3 Encounters:  10/16/21 135 lb 12.8 oz (61.6 kg)  10/09/21 135 lb 6.4 oz (61.4 kg)  09/20/21 133 lb (60.3 kg)    General: Appears her stated age, well developed, well nourished in NAD. Skin: No bruising noted. Cardiovascular: Normal rate and rhythm. S1,S2 noted.  No murmur, rubs or gallops noted.  Pulmonary/Chest: Normal effort and positive vesicular breath sounds. No respiratory distress. No wheezes, rales or ronchi noted.  Musculoskeletal: No difficulty with gait.  Neurological: Alert and oriented.   BMET    Component  Value Date/Time   NA 134 (L) 07/12/2021 1305   NA 140 04/22/2013 0237   K 3.9 07/12/2021 1305   K 3.6 04/22/2013 0237   CL 102 07/12/2021 1305   CL 106 04/22/2013 0237   CO2 25 07/12/2021 1305   CO2 29 04/22/2013 0237   GLUCOSE 159 (H) 07/12/2021 1305   GLUCOSE 88 04/22/2013 0237   BUN 20 07/12/2021 1305   BUN 26 (H) 04/22/2013 4034  CREATININE 0.76 07/12/2021 1305   CREATININE 0.74 01/01/2014 1556   CALCIUM 9.4 07/12/2021 1305   CALCIUM 9.7 04/22/2013 0237   GFRNONAA >60 07/12/2021 1305   GFRNONAA >60 04/22/2013 0237   GFRAA >60 08/10/2020 1025   GFRAA >60 04/22/2013 0237    Lipid Panel     Component Value Date/Time   CHOL 215 (H) 10/09/2021 1406   TRIG 82 10/09/2021 1406   HDL 78 10/09/2021 1406   CHOLHDL 2.8 10/09/2021 1406   VLDL 16.0 04/26/2020 1503   LDLCALC 119 (H) 10/09/2021 1406    CBC    Component Value Date/Time   WBC 5.4 07/12/2021 1305   RBC 3.99 07/12/2021 1305   HGB 13.0 07/12/2021 1305   HGB 13.8 05/05/2014 1218   HCT 38.5 07/12/2021 1305   HCT 36.4 05/12/2014 0628   PLT 183 07/12/2021 1305   PLT 163 05/05/2014 1218   MCV 96.5 07/12/2021 1305   MCV 97 05/05/2014 1218   MCH 32.6 07/12/2021 1305   MCHC 33.8 07/12/2021 1305   RDW 12.6 07/12/2021 1305   RDW 13.6 05/05/2014 1218   LYMPHSABS 1.1 07/12/2021 1305   MONOABS 0.4 07/12/2021 1305   EOSABS 0.1 07/12/2021 1305   BASOSABS 0.0 07/12/2021 1305    Hgb A1C Lab Results  Component Value Date   HGBA1C 5.3 10/09/2021           Assessment & Plan:   Encounter for Anticoagulation Counseling, Hx of DVT/PE:  Discussed the need to transition from Coumadin as we do not have a coumadin clinic and Ravinia can no longer see her She is hesitant to do this and wants to know if the [place] nurse at [facility] could check her INR there- will reach out to them to discuss this Her other option would be to come here monthly or to [place] for INR draws.   [name], NP This visit occurred during  the SARS-CoV-2 public health emergency.  Safety protocols were in place, including screening questions prior to the visit, additional usage of staff PPE, and extensive cleaning of exam room while observing appropriate contact time as indicated for disinfecting solutions.

## 2021-11-10 DIAGNOSIS — R2689 Other abnormalities of gait and mobility: Secondary | ICD-10-CM | POA: Diagnosis not present

## 2021-11-10 DIAGNOSIS — M6281 Muscle weakness (generalized): Secondary | ICD-10-CM | POA: Diagnosis not present

## 2021-11-10 DIAGNOSIS — R278 Other lack of coordination: Secondary | ICD-10-CM | POA: Diagnosis not present

## 2021-11-13 ENCOUNTER — Encounter: Payer: Self-pay | Admitting: Internal Medicine

## 2021-11-13 NOTE — Assessment & Plan Note (Signed)
Elevated today Will have her monitor at home or at the [place] clinic Reinforced DASH diet Continue Lisinopril and Metoprolol for now

## 2021-11-13 NOTE — Patient Instructions (Signed)
Warfarin Information Warfarin is a blood thinner (anticoagulant). Anticoagulants help to prevent the formation of blood clots or keep them from getting bigger. Your health care provider will monitor the anticoagulation effect of warfarin closely and will adjust your medicine as needed. Who should use warfarin? Warfarin is prescribed for people who have blood clots, or who are at risk for developing harmful blood clots, such as people who: Have mechanical heart valves. Have irregular heart rhythms (atrial fibrillation). Have certain clotting disorders. Have had blood clots in the past or are currently receiving treatment for them. This includes people who have had a stroke, blood clots in the lungs (pulmonary embolism, or PE), or blood clots in the legs (deep vein thrombosis,or DVT). How is warfarin taken? Warfarin is taken by mouth (orally). Warfarin tablets come in different strengths. The strength is printed on the tablet, and each strength is a different color. If you get a new prescription and the color of your tablet is different than usual, tell your pharmacist or health care provider immediately. Take warfarin exactly as told by your health care provider, at the same time every day. Doing this helps you avoid bleeding or blood clots that could result in serious injury, pain, or disability. Contact your health care provider if a dose is forgotten or missed. Do not change or take additional dosesto make up for missed or accidental extra doses. What blood tests do I need while taking warfarin? Warfarin is a medicine that needs to be closely monitored with blood tests. It is very important to keep all lab visits and follow-up visits with your health care provider. These tests measure the blood's ability to clot and are called prothrombin tests (PT)or international normalized ratio (INR) tests. These tests can be done with a finger stick or a blood draw. What does the INR test result mean? The PT  test results will be reported as the INR. Your health care provider will tell you your target INR range. If your INR is not in your target range, your health care provider may adjust your dosage. If your INR is above your target range, there is a risk of bleeding. Your dosage of warfarin may need to be decreased. If your INR is below your target range, there is a risk of clotting. Your dosage of warfarin may need to be increased. How often is the INR test needed? When you first start warfarin, you will usually have your INR checked every few days until the health care provider determines the correct dosage of warfarin. After you have reached your target INR, your INR will be tested less often. However, you will need to have your INR checked at least once every 4-6 weeks while you take warfarin. Some people may be able to use home monitoring to check their INR. Ask your health care provider if this applies to you. What are the side effects of warfarin? Too much warfarin can cause bleeding or hemorrhage in any part of the body, such as: Bleeding from the gums. Unexplained bruises or bruises that get larger. A nosebleed that is not easily stopped. Bleeding in the brain (hemorrhagic stroke). Coughing up or vomiting blood. Blood in the urine or stools. Warfarin may also cause: Skin rash or irritations. Nausea that does not go away. Severe pain in the back or joints. Painful toes that turn blue or purple (purple toe syndrome). Painful ulcers that do not go away (skin necrosis). What precautions do I need to take while using warfarin? Wear   a medical alert bracelet or carry a card that lists what medicines you take. Make sure that all health care providers, including your dentist, know you are taking warfarin. Avoid situations that cause bleeding by: Using a softer toothbrush. Flossing with waxed floss. Shaving with an electric razor, not with a blade. Limiting your use of sharp  objects. Avoiding activities that put you at risk for injury, such as contact sports. What do I need to know about warfarin and pregnancy or breastfeeding? If you are taking warfarin and you become pregnant, or plan to become pregnant, contact your health care provider right away. Though warfarin has been associated with birth defects, it can be used in some cases after weighing risks to mother and baby. If you plan to breastfeed while taking warfarin, talk with your health care provider first. What do I need to know about warfarin and alcohol or drug use? Do not drink alcohol if: Your health care provider tells you not to drink. You are pregnant, may be pregnant, or are planning to become pregnant. If you drink alcohol: Limit how much you have to: 0-1 drink a day for women. 0-2 drinks a day for men. Know how much alcohol is in your drink. In the [place] one drink equals one 12 oz bottle of beer (355 mL), one 5 oz glass of wine (148 mL), or one 1 oz glass of hard liquor (44 mL). If you change the amount of alcohol that you drink, tell your health care provider. Your warfarin dosage may need to be changed. Do not use any products that contain nicotine or tobacco. These products include cigarettes, chewing tobacco, and vaping devices, such as e-cigarettes. If you need help quitting, ask your health care provider. If you use nicotine or tobacco products and change the amount that you use, tell your health care provider. Your warfarin dosage may need to be changed. Avoid drug use while taking warfarin. The effects of drugs on warfarin are not known. What do I need to know about warfarin and other medicines or supplements? Many prescription and over-the-counter medicines can interfere with warfarin. Talk with your health care provider or your pharmacist before starting or stopping any new medicines. This includes vitamins, herbs, supplements, and pain medicines. Some common over-the-counter medicines  that may increase the risk of dangerous bleeding while taking warfarin include: Aspirin. NSAIDs, such as ibuprofen or naproxen. Vitamin E. Fish oils. What do I need to know about warfarin and my diet? Vitamin K decreases the effect of warfarin, and it is found in many foods. Eat a consistent amount of foods that contain vitamin K. For example, you may decide to eat 2 servings of vitamin K-containing foods each day. It is important to maintain a normal, balanced diet while taking warfarin. Avoid major changes in your diet. If you are going to change your diet, talk with your health care provider before making changes. Your health care provider may recommend that you work with a dietitian. Contact a health care provider if you: Miss a dose. Take an extra dose. Plan to have any kind of surgery or procedure. Ask whether you should stop taking warfarin or change your dose before your surgery. Are unable to take your medicine due to nausea, vomiting, or diarrhea. Have any major changes in your diet, or you plan to make major changes in your diet. Start or stop any over-the-counter medicine, prescription medicine, herbal supplement, or dietary supplement. Become pregnant, plan to become pregnant, or think you may   be pregnant. Have menstrual periods that are heavier than usual. Have unusual bruising. Get help right away if you: Have signs of an allergic reaction, such as: Swelling of the lips, face, tongue, mouth, or throat. Rash or itchy, red, swollen areas of skin (hives). Trouble breathing. Chest tightness. Fall or have an accident, especially if you hit your head. Have signs that your blood is too thin, such as: Blood in your urine. Your urine may look reddish, pinkish, or tea-colored. Blood in your stool. Your stool may be black or bright red. Coughing up or vomiting blood. The blood may be bright red, or it may look like coffee grounds. Bleeding that does not stop after applying pressure  to the area for 30 minutes. Have signs of a blood clot in your leg or arm, such as: Pain or swelling in your leg or arm. Skin that is red or warm to the touch on your arm or leg. Have signs of blood in your lung, such as: Shortness of breath or difficulty breathing. Chest pain. Unexplained fever. Have any symptoms of a stroke. "BE FAST" is an easy way to remember the main warning signs of a stroke: B - Balance. Signs are dizziness, sudden trouble walking, or loss of balance. E - Eyes. Signs are trouble seeing or a sudden change in vision. F - Face. Signs are sudden weakness or numbness of the face, or the face or eyelid drooping on one side. A - Arms. Signs are weakness or numbness in an arm. This happens suddenly and usually on one side of the body. S - Speech. Signs are sudden trouble speaking, slurred speech, or trouble understanding what people say. T - Time. Time to call emergency services. Write down what time symptoms started. Have other signs of a stroke, such as: A sudden, severe headache with no known cause. Nausea or vomiting. Seizure. Have other signs of a reaction to warfarin, such as: Purple or blue toes. Skin ulcers that do not go away. These symptoms may represent a serious problem that is an emergency. Do not wait to see if the symptoms will go away. Get medical help right away. Call your local emergency services (911 in the [place] Do not drive yourself to the hospital. Summary Warfarin is a medicine that thins blood. It is used to prevent or treat blood clots. You must be monitored closely while on this medicine. Keep all follow-up visits. Make sure that you know your target INR range and your warfarin dosage. Wear or carry identification that says you are taking warfarin. Take warfarin at the same time every day. Call your health care provider if you miss a dose or if you take an extra dose. Do not change the dosage of warfarin on your own. Know the signs and symptoms  of blood clots, bleeding, and a stroke. Know when to get emergency medical help. This information is not intended to replace advice given to you by your health care provider. Make sure you discuss any questions you have with your health care provider. Document Revised: 02/08/2021 Document Reviewed: 02/08/2021 Elsevier Patient Education  2022 Elsevier Inc.  

## 2021-11-14 DIAGNOSIS — M6281 Muscle weakness (generalized): Secondary | ICD-10-CM | POA: Diagnosis not present

## 2021-11-14 DIAGNOSIS — R278 Other lack of coordination: Secondary | ICD-10-CM | POA: Diagnosis not present

## 2021-11-14 DIAGNOSIS — R2689 Other abnormalities of gait and mobility: Secondary | ICD-10-CM | POA: Diagnosis not present

## 2021-11-15 DIAGNOSIS — R2689 Other abnormalities of gait and mobility: Secondary | ICD-10-CM | POA: Diagnosis not present

## 2021-11-15 DIAGNOSIS — R278 Other lack of coordination: Secondary | ICD-10-CM | POA: Diagnosis not present

## 2021-11-15 DIAGNOSIS — M6281 Muscle weakness (generalized): Secondary | ICD-10-CM | POA: Diagnosis not present

## 2021-11-17 DIAGNOSIS — R278 Other lack of coordination: Secondary | ICD-10-CM | POA: Diagnosis not present

## 2021-11-17 DIAGNOSIS — R2689 Other abnormalities of gait and mobility: Secondary | ICD-10-CM | POA: Diagnosis not present

## 2021-11-17 DIAGNOSIS — M6281 Muscle weakness (generalized): Secondary | ICD-10-CM | POA: Diagnosis not present

## 2021-11-20 ENCOUNTER — Other Ambulatory Visit: Payer: Self-pay | Admitting: Internal Medicine

## 2021-11-20 NOTE — Telephone Encounter (Signed)
Requested Prescriptions  Pending Prescriptions Disp Refills   lisinopril (ZESTRIL) 10 MG tablet [Pharmacy Med Name: LISINOPRIL 10 MG TABLET] 90 tablet 1    Sig: TAKE 1 TABLET BY MOUTH EVERY DAY     Cardiovascular:  ACE Inhibitors Failed - 11/20/2021  8:25 AM      Failed - Last BP in normal range    BP Readings from Last 1 Encounters:  11/09/21 (!) 170/78         Passed - Cr in normal range and within 180 days    Creat  Date Value Ref Range Status  01/01/2014 0.74 0.50 - 1.10 mg/dL Final   Creatinine, Ser  Date Value Ref Range Status  07/12/2021 0.76 0.44 - 1.00 mg/dL Final         Passed - K in normal range and within 180 days    Potassium  Date Value Ref Range Status  07/12/2021 3.9 3.5 - 5.1 mmol/L Final  04/22/2013 3.6 3.5 - 5.1 mmol/L Final         Passed - Patient is not pregnant      Passed - Valid encounter within last 6 months    Recent Outpatient Visits          1 week ago HTN, goal below 140/90   [facility] [place], Coralie Keens, NP   2 weeks ago Viral URI with cough   [facility] [place], [place], [place]   1 month ago Pain of left arm   [facility] [place], Coralie Keens, NP   1 month ago Balance problem   [place], NP   7 months ago Change in bowel habits   [facility] [place], Coralie Keens, NP

## 2021-11-21 DIAGNOSIS — R2689 Other abnormalities of gait and mobility: Secondary | ICD-10-CM | POA: Diagnosis not present

## 2021-11-21 DIAGNOSIS — R278 Other lack of coordination: Secondary | ICD-10-CM | POA: Diagnosis not present

## 2021-11-21 DIAGNOSIS — M6281 Muscle weakness (generalized): Secondary | ICD-10-CM | POA: Diagnosis not present

## 2021-11-22 DIAGNOSIS — R2689 Other abnormalities of gait and mobility: Secondary | ICD-10-CM | POA: Diagnosis not present

## 2021-11-22 DIAGNOSIS — M6281 Muscle weakness (generalized): Secondary | ICD-10-CM | POA: Diagnosis not present

## 2021-11-22 DIAGNOSIS — R278 Other lack of coordination: Secondary | ICD-10-CM | POA: Diagnosis not present

## 2021-11-24 ENCOUNTER — Ambulatory Visit: Payer: Self-pay

## 2021-11-24 DIAGNOSIS — M6281 Muscle weakness (generalized): Secondary | ICD-10-CM | POA: Diagnosis not present

## 2021-11-24 DIAGNOSIS — R2689 Other abnormalities of gait and mobility: Secondary | ICD-10-CM | POA: Diagnosis not present

## 2021-11-24 DIAGNOSIS — R278 Other lack of coordination: Secondary | ICD-10-CM | POA: Diagnosis not present

## 2021-11-24 NOTE — Telephone Encounter (Signed)
Copied from [place] [id number]. Topic: General - Other >> Nov 24, 2021  9:36 AM Yvette Rack wrote: Reason for CRM: Pt stated she was returning call to [facility]. Pt requests call back. Cb# [phone]

## 2021-11-24 NOTE — Telephone Encounter (Signed)
I spoke with the patient and informed her that we do not have any appt available. I recommend that the patient seek care at the [facility] at [place]. Pt agreed with triage advice.

## 2021-11-24 NOTE — Telephone Encounter (Signed)
°  Chief Complaint: Left hand and arm pain Symptoms: Pain, "gout." Frequency: Started yesterday Pertinent Negatives: Patient denies swelling or pain Disposition: [] ED /[] Urgent Care (no appt availability in office) / [] Appointment(In office/virtual)/ []  [place] [facility]/ [] Home Care/ [] Refused Recommended Disposition  Additional Notes: Pt. Requests advise from PCP. Please advise.    Answer Assessment - Initial Assessment Questions 1. ONSET: "When did the pain start?"     Yesterday  2. LOCATION: "Where is the pain located?"     Left arm, hand 3. PAIN: "How bad is the pain?" (Scale 1-10; or mild, moderate, severe)   - MILD (1-3): doesn't interfere with normal activities   - MODERATE (4-7): interferes with normal activities (e.g., work or school) or awakens from sleep   - SEVERE (8-10): excruciating pain, unable to do any normal activities, unable to hold a cup of water     5 4. WORK OR EXERCISE: "Has there been any recent work or exercise that involved this part of the body?"     No 5. CAUSE: "What do you think is causing the arm pain?"     Gout 6. OTHER SYMPTOMS: "Do you have any other symptoms?" (e.g., neck pain, swelling, rash, fever, numbness, weakness)     No 7. PREGNANCY: "Is there any chance you are pregnant?" "When was your last menstrual period?"     No  Protocols used: Arm Pain-A-AH

## 2021-11-24 NOTE — Telephone Encounter (Signed)
Attempted to contact the patient, no answer. I left a detail message on his voicemail.

## 2021-11-28 DIAGNOSIS — R2689 Other abnormalities of gait and mobility: Secondary | ICD-10-CM | POA: Diagnosis not present

## 2021-11-28 DIAGNOSIS — M6281 Muscle weakness (generalized): Secondary | ICD-10-CM | POA: Diagnosis not present

## 2021-11-28 DIAGNOSIS — R278 Other lack of coordination: Secondary | ICD-10-CM | POA: Diagnosis not present

## 2021-11-29 DIAGNOSIS — R278 Other lack of coordination: Secondary | ICD-10-CM | POA: Diagnosis not present

## 2021-11-29 DIAGNOSIS — M6281 Muscle weakness (generalized): Secondary | ICD-10-CM | POA: Diagnosis not present

## 2021-11-29 DIAGNOSIS — R2689 Other abnormalities of gait and mobility: Secondary | ICD-10-CM | POA: Diagnosis not present

## 2021-11-30 ENCOUNTER — Telehealth: Payer: Self-pay | Admitting: Internal Medicine

## 2021-11-30 DIAGNOSIS — D802 Selective deficiency of immunoglobulin A [IgA]: Secondary | ICD-10-CM | POA: Diagnosis not present

## 2021-11-30 DIAGNOSIS — Z7901 Long term (current) use of anticoagulants: Secondary | ICD-10-CM | POA: Diagnosis not present

## 2021-11-30 DIAGNOSIS — Z86718 Personal history of other venous thrombosis and embolism: Secondary | ICD-10-CM | POA: Diagnosis not present

## 2021-11-30 DIAGNOSIS — Z86711 Personal history of pulmonary embolism: Secondary | ICD-10-CM | POA: Diagnosis not present

## 2021-11-30 LAB — PROTIME-INR: Protime: 32.2 — AB (ref 10.0–13.8)

## 2021-11-30 LAB — POCT INR: INR: 3.4 — AB (ref 0.9–1.1)

## 2021-11-30 NOTE — Telephone Encounter (Signed)
Please call patient.  I have reached out to [place] at [facility] [address].  As far as her blood pressure, I would like to increase her lisinopril to 20 mg daily.  Her INR level is too high 3.4 that was checked today.  We need to change her Coumadin to 7.5 mg tab on Wednesday and 5 mg all other days.  She will need to follow-up with Sharyn Lull or Delane Ginger in 2 weeks for repeat blood pressure and INR check

## 2021-12-01 DIAGNOSIS — M6281 Muscle weakness (generalized): Secondary | ICD-10-CM | POA: Diagnosis not present

## 2021-12-01 DIAGNOSIS — R2689 Other abnormalities of gait and mobility: Secondary | ICD-10-CM | POA: Diagnosis not present

## 2021-12-01 DIAGNOSIS — R278 Other lack of coordination: Secondary | ICD-10-CM | POA: Diagnosis not present

## 2021-12-01 NOTE — Telephone Encounter (Signed)
Attempted to contact the patient again, no answer.

## 2021-12-01 NOTE — Telephone Encounter (Signed)
Pt called back and wanted to speak directly with [facility], please advise.

## 2021-12-01 NOTE — Telephone Encounter (Signed)
I called the patient and notified her of her INR results. After several minutes on the phone with her trying to explain her results. She verbalize understanding, but would like to know what her Prothrombin result is prior to making any changes to her Coumadin. Please advise

## 2021-12-01 NOTE — Telephone Encounter (Signed)
Her INR is 3.4.  Her INR goal is 2-3.

## 2021-12-05 NOTE — Progress Notes (Addendum)
Opened encounter to remove INR reminders to the [place] pool.  Pt is now being managed by her PCP, [name].

## 2021-12-06 DIAGNOSIS — M6281 Muscle weakness (generalized): Secondary | ICD-10-CM | POA: Diagnosis not present

## 2021-12-06 DIAGNOSIS — R278 Other lack of coordination: Secondary | ICD-10-CM | POA: Diagnosis not present

## 2021-12-06 DIAGNOSIS — R2689 Other abnormalities of gait and mobility: Secondary | ICD-10-CM | POA: Diagnosis not present

## 2021-12-08 DIAGNOSIS — R278 Other lack of coordination: Secondary | ICD-10-CM | POA: Diagnosis not present

## 2021-12-08 DIAGNOSIS — R2689 Other abnormalities of gait and mobility: Secondary | ICD-10-CM | POA: Diagnosis not present

## 2021-12-08 DIAGNOSIS — M6281 Muscle weakness (generalized): Secondary | ICD-10-CM | POA: Diagnosis not present

## 2021-12-11 DIAGNOSIS — R278 Other lack of coordination: Secondary | ICD-10-CM | POA: Diagnosis not present

## 2021-12-11 DIAGNOSIS — M6281 Muscle weakness (generalized): Secondary | ICD-10-CM | POA: Diagnosis not present

## 2021-12-11 DIAGNOSIS — R2689 Other abnormalities of gait and mobility: Secondary | ICD-10-CM | POA: Diagnosis not present

## 2021-12-13 DIAGNOSIS — M6281 Muscle weakness (generalized): Secondary | ICD-10-CM | POA: Diagnosis not present

## 2021-12-13 DIAGNOSIS — R2689 Other abnormalities of gait and mobility: Secondary | ICD-10-CM | POA: Diagnosis not present

## 2021-12-13 DIAGNOSIS — R278 Other lack of coordination: Secondary | ICD-10-CM | POA: Diagnosis not present

## 2021-12-14 DIAGNOSIS — Z86718 Personal history of other venous thrombosis and embolism: Secondary | ICD-10-CM | POA: Diagnosis not present

## 2021-12-14 DIAGNOSIS — Z7901 Long term (current) use of anticoagulants: Secondary | ICD-10-CM | POA: Diagnosis not present

## 2021-12-14 DIAGNOSIS — Z86711 Personal history of pulmonary embolism: Secondary | ICD-10-CM | POA: Diagnosis not present

## 2021-12-14 DIAGNOSIS — D802 Selective deficiency of immunoglobulin A [IgA]: Secondary | ICD-10-CM | POA: Diagnosis not present

## 2021-12-18 ENCOUNTER — Telehealth: Payer: Self-pay | Admitting: Internal Medicine

## 2021-12-18 NOTE — Telephone Encounter (Signed)
Becky Farrell calling from [facility] is calling to ask was the fax received with the lab results. [name] is request a call back. Does medication adjusted?  Lab Results- INR 2.5, PT 24.9  Please advise CB- [phone]

## 2021-12-19 NOTE — Telephone Encounter (Signed)
Pt called and notified of her lab results. She verbalize understanding, no questions or concerns

## 2022-01-02 ENCOUNTER — Encounter: Payer: Self-pay | Admitting: Internal Medicine

## 2022-01-02 ENCOUNTER — Ambulatory Visit (INDEPENDENT_AMBULATORY_CARE_PROVIDER_SITE_OTHER): Payer: Medicare Other | Admitting: Internal Medicine

## 2022-01-02 ENCOUNTER — Other Ambulatory Visit: Payer: Self-pay

## 2022-01-02 VITALS — BP 166/88 | HR 69 | Temp 96.9°F | Wt 131.0 lb

## 2022-01-02 DIAGNOSIS — Z86711 Personal history of pulmonary embolism: Secondary | ICD-10-CM | POA: Diagnosis not present

## 2022-01-02 DIAGNOSIS — Z7901 Long term (current) use of anticoagulants: Secondary | ICD-10-CM | POA: Diagnosis not present

## 2022-01-02 DIAGNOSIS — I160 Hypertensive urgency: Secondary | ICD-10-CM | POA: Diagnosis not present

## 2022-01-02 DIAGNOSIS — Z86718 Personal history of other venous thrombosis and embolism: Secondary | ICD-10-CM | POA: Diagnosis not present

## 2022-01-02 MED ORDER — LISINOPRIL 20 MG PO TABS: 20.0000 mg | ORAL_TABLET | Freq: Every day | ORAL | 3 refills | Status: DC

## 2022-01-02 NOTE — Progress Notes (Signed)
Subjective:    Patient ID: Becky Farrell, female    DOB: May 22, 1934, 88 y.o.   MRN: [id number]  HPI  Patient presents to clinic today for follow-up of her PT/INR.  Her last level was 24.9/2.5, 12/16/2021.  She is taking Coumadin as prescribed.  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  She has declined switching to Eliquis or Xarelto in the past.  Of note, her BP today is 196/107. She is taking Lisinopril and Metoprolol as prescribed. She is not checking her blood pressure at home.  She denies headaches, dizziness, vision changes, chest pain or shortness of breath.  She feels like her blood pressure is elevated secondary to stress and whitecoat hypertension.   Review of Systems     Past Medical History:  Diagnosis Date   Colon polyps    Family history of breast cancer    History of pulmonary embolism 2011   Hx of melanoma of skin 2003   Right leg   Hypertension    Melanoma ([place]) 2003   right lower leg. Treated in [place].   Personal history of radiation therapy    Seizures ([place]) 1993   no meds since (approx) 2014   Vertigo    x1. R/T perforated ear drum prior to 2013    Current Outpatient Medications  Medication Sig Dispense Refill   aspirin 81 MG tablet Take 81 mg by mouth daily.     Cholecalciferol (VITAMIN D3) 2000 UNITS TABS Take 2,000 Units by mouth daily.      denosumab (PROLIA) 60 MG/ML SOSY injection Inject 60 mg into the skin every 6 (six) months.     ketoconazole (NIZORAL) 2 % cream Apply to rash under breasts 2 times a day until rash improved. 60 g 2   letrozole (FEMARA) 2.5 MG tablet TAKE 1 TABLET BY MOUTH EVERY DAY 90 tablet 3   lisinopril (ZESTRIL) 10 MG tablet Take 2 tablets (20 mg total) by mouth daily. 90 tablet 1   metoprolol tartrate (LOPRESSOR) 25 MG tablet TAKE 1 TABLET BY MOUTH TWICE A DAY 180 tablet 2   polyethylene glycol (MIRALAX / GLYCOLAX) packet Take 17 g by mouth every other day.     warfarin (COUMADIN) 5 MG tablet TAKE ONE TABLET BY MOUTH DAILY  EXCEPT TAKE 7.5MG  ON MONDAYS AND THURSDAYS OR AS DIRECTED BY COUMADIN CLINIC 105 tablet 1   warfarin (COUMADIN) 7.5 MG tablet Take one tablet, 7.5mg ,  by mouth on Mondays and Thursday or as directed by anticoagulation clinic. 70 tablet 1   warfarin (COUMADIN) 7.5 MG tablet Take one tablet, 7.5mg ,  by mouth on Mondays and Thursdays or as directed by anticoagulation clinic. 70 tablet 1   Wheat Dextrin (BENEFIBER PO) Take 2 each by mouth See admin instructions. Mix 2 teaspoons and drink 3 times daily     No current facility-administered medications for this visit.    Allergies  Allergen Reactions   Clobetasol Other (See Comments)    vertigo   Clobetasol Propionate Other (See Comments)    This is ointment form UNSPECIFIED REACTION    Phenergan [Promethazine Hcl] Other (See Comments)    Rapid heart rate   Dextromethorphan Palpitations and Other (See Comments)    promethazine with DM, rapid hearbeat   Zithromax [Azithromycin] Palpitations and Other (See Comments)    Per OSH report, pt unable to recall Rapid heart rate    Family History  Problem Relation Age of Onset   Heart disease Mother  Aortic aneurysm Father    Breast cancer Daughter 85   Breast cancer Niece 48    Social History   Socioeconomic History   Marital status: Married    Spouse name: Not on file   Number of children: Not on file   Years of education: Not on file   Highest education level: Not on file  Occupational History   Not on file  Tobacco Use   Smoking status: Never   Smokeless tobacco: Never  Vaping Use   Vaping Use: Never used  Substance and Sexual Activity   Alcohol use: No   Drug use: Never   Sexual activity: Never  Other Topics Concern   Not on file  Social History Narrative   Recently moved from [place] to [facility].   Daughter and grandchildren live in [place].   Has other children in [place].   Retired Pharmacist, hospital.      Husband has been recovering from Prostate CA.   Social Determinants  of Health   Financial Resource Strain: Not on file  Food Insecurity: Not on file  Transportation Needs: Not on file  Physical Activity: Not on file  Stress: Not on file  Social Connections: Not on file  Intimate Partner Violence: Not on file     Constitutional: Denies fever, malaise, fatigue, headache or abrupt weight changes.  Respiratory: Denies difficulty breathing, shortness of breath, cough or sputum production.   Cardiovascular: Denies chest pain, chest tightness, palpitations or swelling in the hands or feet.  Neurological: Denies dizziness, difficulty with memory, difficulty with speech or problems with balance and coordination.  Psych: Patient reports stress.  Denies anxiety, depression, SI/HI.  No other specific complaints in a complete review of systems (except as listed in HPI above).  Objective:   Physical Exam  BP (!) 200/102 (BP Location: Left Arm, Patient Position: Sitting, Cuff Size: Normal)    Pulse 69    Temp (!) 96.9 F (36.1 C) (Temporal)    Wt 131 lb (59.4 kg)    SpO2 100%    BMI 23.96 kg/m   Wt Readings from Last 3 Encounters:  11/09/21 128 lb 3.2 oz (58.2 kg)  10/16/21 135 lb 12.8 oz (61.6 kg)  10/09/21 135 lb 6.4 oz (61.4 kg)    General: Appears her stated age, well developed, well nourished in NAD. HEENT: Head: normal shape and size; Eyes: sclera white and EOMs intact;  Cardiovascular: Normal rate and rhythm. S1,S2 noted.  No murmur, rubs or gallops noted. No JVD or BLE edema.  Pulmonary/Chest: Normal effort and positive vesicular breath sounds. No respiratory distress. No wheezes, rales or ronchi noted.  Musculoskeletal:  No difficulty with gait.  Neurological: Alert and oriented.  Mild cognitive impairment. Psychiatric: Mood and affect normal.  Anxious appearing. Judgment and thought content normal.   BMET    Component Value Date/Time   NA 134 (L) 07/12/2021 1305   NA 140 04/22/2013 0237   K 3.9 07/12/2021 1305   K 3.6 04/22/2013 0237   CL  102 07/12/2021 1305   CL 106 04/22/2013 0237   CO2 25 07/12/2021 1305   CO2 29 04/22/2013 0237   GLUCOSE 159 (H) 07/12/2021 1305   GLUCOSE 88 04/22/2013 0237   BUN 20 07/12/2021 1305   BUN 26 (H) 04/22/2013 0237   CREATININE 0.76 07/12/2021 1305   CREATININE 0.74 01/01/2014 1556   CALCIUM 9.4 07/12/2021 1305   CALCIUM 9.7 04/22/2013 0237   GFRNONAA >60 07/12/2021 1305   GFRNONAA >60 04/22/2013  0237   GFRAA >60 08/10/2020 1025   GFRAA >60 04/22/2013 0237    Lipid Panel     Component Value Date/Time   CHOL 215 (H) 10/09/2021 1406   TRIG 82 10/09/2021 1406   HDL 78 10/09/2021 1406   CHOLHDL 2.8 10/09/2021 1406   VLDL 16.0 04/26/2020 1503   LDLCALC 119 (H) 10/09/2021 1406    CBC    Component Value Date/Time   WBC 5.4 07/12/2021 1305   RBC 3.99 07/12/2021 1305   HGB 13.0 07/12/2021 1305   HGB 13.8 05/05/2014 1218   HCT 38.5 07/12/2021 1305   HCT 36.4 05/12/2014 0628   PLT 183 07/12/2021 1305   PLT 163 05/05/2014 1218   MCV 96.5 07/12/2021 1305   MCV 97 05/05/2014 1218   MCH 32.6 07/12/2021 1305   MCHC 33.8 07/12/2021 1305   RDW 12.6 07/12/2021 1305   RDW 13.6 05/05/2014 1218   LYMPHSABS 1.1 07/12/2021 1305   MONOABS 0.4 07/12/2021 1305   EOSABS 0.1 07/12/2021 1305   BASOSABS 0.0 07/12/2021 1305    Hgb A1C Lab Results  Component Value Date   HGBA1C 5.3 10/09/2021           Assessment & Plan:   Hypertensive Urgency:  Clonidine 0.1 mg p.o. x1 Manual repeat 166/68 Will increase Lisnopril to 20 mg daily Have AL nurse check BP M-W-F Reinforced DASH diet Discussed stress reduction techniques   Chronic Anticoagulation, History of DVT/PE:  She is still hesitant to switch to Eliquis or Xarelto at this time Will continue Coumadin as this  [name], NP This visit occurred during the SARS-CoV-2 public health emergency.  Safety protocols were in place, including screening questions prior to the visit, additional usage of staff PPE, and extensive  cleaning of exam room while observing appropriate contact time as indicated for disinfecting solutions.

## 2022-01-02 NOTE — Patient Instructions (Signed)

## 2022-01-03 ENCOUNTER — Ambulatory Visit: Payer: Self-pay | Admitting: *Deleted

## 2022-01-03 NOTE — Telephone Encounter (Signed)
Summary: medication question   Patient called states dosae on lisinopril was increased to 20 mg so she wants to knkow should she take 2 of the 10 mg she has left or start taking the 20 mg. Please call back      Patient advised she may finish out her 10 mg tablets by taking 2 pills to equal her 20 mg new dosing. Patient states she inderstands. Reason for Disposition  Caller has medicine question only, adult not sick, AND triager answers question  Answer Assessment - Initial Assessment Questions 1. NAME of MEDICATION: "What medicine are you calling about?"     Lisinopril 10mg - to 20 mg 2. QUESTION: "What is your question?" (e.g., double dose of medicine, side effect)     Can patient finish out her 10 mg tablets by taking 2 pills to equal new 20 mg dosing- advised yes 3. PRESCRIBING HCP: "Who prescribed it?" Reason: if prescribed by specialist, call should be referred to that group.     PCP  Protocols used: Medication Question Call-A-AH

## 2022-01-04 ENCOUNTER — Other Ambulatory Visit: Payer: Self-pay

## 2022-01-04 ENCOUNTER — Encounter: Payer: Self-pay | Admitting: Podiatry

## 2022-01-04 ENCOUNTER — Ambulatory Visit (INDEPENDENT_AMBULATORY_CARE_PROVIDER_SITE_OTHER): Payer: Medicare Other | Admitting: Podiatry

## 2022-01-04 DIAGNOSIS — B351 Tinea unguium: Secondary | ICD-10-CM | POA: Diagnosis not present

## 2022-01-04 DIAGNOSIS — D689 Coagulation defect, unspecified: Secondary | ICD-10-CM | POA: Diagnosis not present

## 2022-01-04 DIAGNOSIS — M79609 Pain in unspecified limb: Secondary | ICD-10-CM | POA: Diagnosis not present

## 2022-01-04 NOTE — Progress Notes (Signed)
This patient returns to my office for at risk foot care.  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  This patient is unable to cut nails herself since the patient cannot reach her nails.These nails are painful walking and wearing shoes.  This patient presents for at risk foot care today.  General Appearance  Alert, conversant and in no acute stress.  Vascular  Dorsalis pedis and posterior tibial  pulses are palpable  bilaterally.  Capillary return is within normal limits  bilaterally. Temperature is within normal limits  bilaterally.  Neurologic  Senn-Weinstein monofilament wire test within normal limits  bilaterally. Muscle power within normal limits bilaterally.  Nails Thick disfigured discolored nails with subungual debris  from hallux to fifth toes bilaterally. No evidence of bacterial infection or drainage bilaterally.  Orthopedic  No limitations of motion  feet .  No crepitus or effusions noted.  No bony pathology or digital deformities noted.  Skin  normotropic skin with no porokeratosis noted bilaterally.  No signs of infections or ulcers noted.     Onychomycosis  Pain in right toes  Pain in left toes  Consent was obtained for treatment procedures.   Mechanical debridement of nails 1-5  bilaterally performed with a nail nipper.  Filed with dremel without incident.    Return office visit    3 months                  Told patient to return for periodic foot care and evaluation due to potential at risk complications.   Gardiner Barefoot DPM

## 2022-01-08 ENCOUNTER — Other Ambulatory Visit: Payer: Self-pay | Admitting: *Deleted

## 2022-01-08 DIAGNOSIS — C50211 Malignant neoplasm of upper-inner quadrant of right female breast: Secondary | ICD-10-CM

## 2022-01-08 DIAGNOSIS — Z170 Estrogen receptor positive status [ER+]: Secondary | ICD-10-CM

## 2022-01-12 ENCOUNTER — Telehealth: Payer: Self-pay | Admitting: Internal Medicine

## 2022-01-12 NOTE — Telephone Encounter (Signed)
Becky Farrell from [facility] stated pt did not come in for PT/INR check. Pt stated that it was not on her calendar and that she would discuss it with her PCP at a January 16, 2022 appointment.

## 2022-01-12 NOTE — Telephone Encounter (Signed)
noted 

## 2022-01-15 ENCOUNTER — Inpatient Hospital Stay: Payer: Medicare Other

## 2022-01-15 ENCOUNTER — Inpatient Hospital Stay: Payer: Medicare Other | Admitting: Nurse Practitioner

## 2022-01-15 ENCOUNTER — Inpatient Hospital Stay: Payer: Medicare Other | Admitting: Oncology

## 2022-01-16 ENCOUNTER — Other Ambulatory Visit: Payer: Self-pay

## 2022-01-16 ENCOUNTER — Ambulatory Visit (INDEPENDENT_AMBULATORY_CARE_PROVIDER_SITE_OTHER): Payer: Medicare Other | Admitting: Internal Medicine

## 2022-01-16 DIAGNOSIS — I10 Essential (primary) hypertension: Secondary | ICD-10-CM

## 2022-01-16 NOTE — Progress Notes (Signed)
Subjective:    Patient ID: Becky Farrell, female    DOB: 1934-05-16, 88 y.o.   MRN: [id number]  HPI  Patient presents the clinic today for 2-week follow-up of HTN.  At her last visit, her lisinopril was increased to 20 mg daily.  She has been monitoring her blood pressure at home and it has been running between 175-102 systolic.  Her BP today is 159/94.  ECG from 09/2021 reviewed.   Review of Systems     Past Medical History:  Diagnosis Date   Colon polyps    Family history of breast cancer    History of pulmonary embolism 2011   Hx of melanoma of skin 2003   Right leg   Hypertension    Melanoma ([place]) 2003   right lower leg. Treated in [place].   Personal history of radiation therapy    Seizures ([place]) 1993   no meds since (approx) 2014   Vertigo    x1. R/T perforated ear drum prior to 2013    Current Outpatient Medications  Medication Sig Dispense Refill   aspirin 81 MG tablet Take 81 mg by mouth daily.     Cholecalciferol (VITAMIN D3) 2000 UNITS TABS Take 2,000 Units by mouth daily.      denosumab (PROLIA) 60 MG/ML SOSY injection Inject 60 mg into the skin every 6 (six) months.     ketoconazole (NIZORAL) 2 % cream Apply to rash under breasts 2 times a day until rash improved. 60 g 2   letrozole (FEMARA) 2.5 MG tablet TAKE 1 TABLET BY MOUTH EVERY DAY 90 tablet 3   lisinopril (ZESTRIL) 20 MG tablet Take 1 tablet (20 mg total) by mouth daily. 90 tablet 3   metoprolol tartrate (LOPRESSOR) 25 MG tablet TAKE 1 TABLET BY MOUTH TWICE A DAY 180 tablet 2   polyethylene glycol (MIRALAX / GLYCOLAX) packet Take 17 g by mouth every other day.     vitamin B-12 (CYANOCOBALAMIN) 1000 MCG tablet Take 1,000 mcg by mouth daily.     warfarin (COUMADIN) 5 MG tablet TAKE ONE TABLET BY MOUTH DAILY EXCEPT TAKE 7.5MG  ON MONDAYS AND THURSDAYS OR AS DIRECTED BY COUMADIN CLINIC 105 tablet 1   warfarin (COUMADIN) 7.5 MG tablet Take one tablet, 7.5mg ,  by mouth on Mondays and Thursday or as directed by  anticoagulation clinic. 70 tablet 1   warfarin (COUMADIN) 7.5 MG tablet Take one tablet, 7.5mg ,  by mouth on Mondays and Thursdays or as directed by anticoagulation clinic. 70 tablet 1   Wheat Dextrin (BENEFIBER PO) Take 2 each by mouth every other day.     No current facility-administered medications for this visit.    Allergies  Allergen Reactions   Clobetasol Other (See Comments)    vertigo   Clobetasol Propionate Other (See Comments)    This is ointment form UNSPECIFIED REACTION    Phenergan [Promethazine Hcl] Other (See Comments)    Rapid heart rate   Dextromethorphan Palpitations and Other (See Comments)    promethazine with DM, rapid hearbeat   Zithromax [Azithromycin] Palpitations and Other (See Comments)    Per OSH report, pt unable to recall Rapid heart rate    Family History  Problem Relation Age of Onset   Heart disease Mother    Aortic aneurysm Father    Breast cancer Daughter 70   Breast cancer Niece 13    Social History   Socioeconomic History   Marital status: Married    Spouse name: Not on file  Number of children: Not on file   Years of education: Not on file   Highest education level: Not on file  Occupational History   Not on file  Tobacco Use   Smoking status: Never   Smokeless tobacco: Never  Vaping Use   Vaping Use: Never used  Substance and Sexual Activity   Alcohol use: No   Drug use: Never   Sexual activity: Never  Other Topics Concern   Not on file  Social History Narrative   Recently moved from [place] to [facility].   Daughter and grandchildren live in [place].   Has other children in [place].   Retired Pharmacist, hospital.      Husband has been recovering from Prostate CA.   Social Determinants of Health   Financial Resource Strain: Not on file  Food Insecurity: Not on file  Transportation Needs: Not on file  Physical Activity: Not on file  Stress: Not on file  Social Connections: Not on file  Intimate Partner Violence: Not on file      Constitutional: Denies fever, malaise, fatigue, headache or abrupt weight changes.  Respiratory: Denies difficulty breathing, shortness of breath, cough or sputum production.   Cardiovascular: Denies chest pain, chest tightness, palpitations or swelling in the hands or feet.  Musculoskeletal: Denies decrease in range of motion, difficulty with gait, muscle pain or joint pain and swelling.  Neurological: Patient does have some difficulty with memory.  Denies dizziness, difficulty with speech or problems with balance and coordination.  Psych: Patient reports stress.  Denies anxiety, depression, SI/HI.  No other specific complaints in a complete review of systems (except as listed in HPI above).  Objective:   Physical Exam  BP (!) 156/80 (BP Location: Left Arm, Patient Position: Sitting, Cuff Size: Normal)    Pulse 66    Resp 15    Ht 5\' 2"  (1.575 m)    Wt 131 lb (59.4 kg)    SpO2 99%    BMI 23.96 kg/m   Wt Readings from Last 3 Encounters:  01/02/22 131 lb (59.4 kg)  11/09/21 128 lb 3.2 oz (58.2 kg)  10/16/21 135 lb 12.8 oz (61.6 kg)    General: Appears her stated age, well developed, well nourished in NAD. HEENT: Head: normal shape and size; Eyes: sclera white and EOMs intact; Cardiovascular: Normal rate and rhythm. S1,S2 noted.  No murmur, rubs or gallops noted. No JVD or BLE edema.  Pulmonary/Chest: Normal effort and positive vesicular breath sounds. No respiratory distress. No wheezes, rales or ronchi noted.  Musculoskeletal: Gait slow and steady. Neurological: Alert and oriented. Coordination normal.    BMET    Component Value Date/Time   NA 134 (L) 07/12/2021 1305   NA 140 04/22/2013 0237   K 3.9 07/12/2021 1305   K 3.6 04/22/2013 0237   CL 102 07/12/2021 1305   CL 106 04/22/2013 0237   CO2 25 07/12/2021 1305   CO2 29 04/22/2013 0237   GLUCOSE 159 (H) 07/12/2021 1305   GLUCOSE 88 04/22/2013 0237   BUN 20 07/12/2021 1305   BUN 26 (H) 04/22/2013 0237   CREATININE  0.76 07/12/2021 1305   CREATININE 0.74 01/01/2014 1556   CALCIUM 9.4 07/12/2021 1305   CALCIUM 9.7 04/22/2013 0237   GFRNONAA >60 07/12/2021 1305   GFRNONAA >60 04/22/2013 0237   GFRAA >60 08/10/2020 1025   GFRAA >60 04/22/2013 0237    Lipid Panel     Component Value Date/Time   CHOL 215 (H) 10/09/2021 1406  TRIG 82 10/09/2021 1406   HDL 78 10/09/2021 1406   CHOLHDL 2.8 10/09/2021 1406   VLDL 16.0 04/26/2020 1503   LDLCALC 119 (H) 10/09/2021 1406    CBC    Component Value Date/Time   WBC 5.4 07/12/2021 1305   RBC 3.99 07/12/2021 1305   HGB 13.0 07/12/2021 1305   HGB 13.8 05/05/2014 1218   HCT 38.5 07/12/2021 1305   HCT 36.4 05/12/2014 0628   PLT 183 07/12/2021 1305   PLT 163 05/05/2014 1218   MCV 96.5 07/12/2021 1305   MCV 97 05/05/2014 1218   MCH 32.6 07/12/2021 1305   MCHC 33.8 07/12/2021 1305   RDW 12.6 07/12/2021 1305   RDW 13.6 05/05/2014 1218   LYMPHSABS 1.1 07/12/2021 1305   MONOABS 0.4 07/12/2021 1305   EOSABS 0.1 07/12/2021 1305   BASOSABS 0.0 07/12/2021 1305    Hgb A1C Lab Results  Component Value Date   HGBA1C 5.3 10/09/2021            Assessment & Plan:    [name], NP This visit occurred during the SARS-CoV-2 public health emergency.  Safety protocols were in place, including screening questions prior to the visit, additional usage of staff PPE, and extensive cleaning of exam room while observing appropriate contact time as indicated for disinfecting solutions.

## 2022-01-18 ENCOUNTER — Encounter: Payer: Self-pay | Admitting: Internal Medicine

## 2022-01-18 NOTE — Assessment & Plan Note (Signed)
Improved but still not at goal We will increase Lisinopril to 30 mg daily Reinforced DASH diet We will have independent living nurse check blood pressure 3 times a week Update me with blood pressure readings in 2 weeks

## 2022-01-18 NOTE — Patient Instructions (Signed)
How to Take Your Blood Pressure ?Blood pressure measures how strongly your blood is pressing against the walls of your arteries. Arteries are blood vessels that carry blood from your heart throughout your body. You can take your blood pressure at home with a machine. ?You may need to check your blood pressure at home: ?To check if you have high blood pressure (hypertension). ?To check your blood pressure over time. ?To make sure your blood pressure medicine is working. ?Supplies needed: ?Blood pressure machine, or monitor. ?Dining room chair to sit in. ?Table or desk. ?Small notebook. ?Pencil or pen. ?How to prepare ?Avoid these things for 30 minutes before checking your blood pressure: ?Having drinks with caffeine in them, such as coffee or tea. ?Drinking alcohol. ?Eating. ?Smoking. ?Exercising. ?Do these things five minutes before checking your blood pressure: ?Go to the bathroom and pee (urinate). ?Sit in a dining chair. Do not sit on a soft couch or an armchair. ?Be quiet. Do not talk. ?How to take your blood pressure ?Follow the instructions that came with your machine. If you have a digital blood pressure monitor, these may be the instructions: ?Sit up straight. ?Place your feet on the floor. Do not cross your ankles or legs. ?Rest your left arm at the level of your heart. You may rest it on a table, desk, or chair. ?Pull up your shirt sleeve. ?Wrap the blood pressure cuff around the upper part of your left arm. The cuff should be 1 inch (2.5 cm) above your elbow. It is best to wrap the cuff around bare skin. ?Fit the cuff snugly around your arm. You should be able to place only one finger between the cuff and your arm. ?Place the cord so that it rests in the bend of your elbow. ?Press the power button. ?Sit quietly while the cuff fills with air and loses air. ?Write down the numbers on the screen. ?Wait 2-3 minutes and then repeat steps 1-10. ?What do the numbers mean? ?Two numbers make up your blood  pressure. The first number is called systolic pressure. The second is called diastolic pressure. An example of a blood pressure reading is "120 over 80" (or 120/80). ?If you are an adult and do not have a medical condition, use this guide to find out if your blood pressure is normal: ?Normal ?First number: below 120. ?Second number: below 80. ?Elevated ?First number: 120-129. ?Second number: below 80. ?Hypertension stage 1 ?First number: 130-139. ?Second number: 80-89. ?Hypertension stage 2 ?First number: 140 or above. ?Second number: 90 or above. ?Your blood pressure is above normal even if only the first or only the second number is above normal. ?Follow these instructions at home: ?Medicines ?Take over-the-counter and prescription medicines only as told by your doctor. ?Tell your doctor if your medicine is causing side effects. ?General instructions ?Check your blood pressure as often as your doctor tells you to. ?Check your blood pressure at the same time every day. ?Take your monitor to your next doctor's appointment. Your doctor will: ?Make sure you are using it correctly. ?Make sure it is working right. ?Understand what your blood pressure numbers should be. ?Keep all follow-up visits as told by your doctor. This is important. ?General tips ?You will need a blood pressure machine, or monitor. Your doctor can suggest a monitor. You can buy one at a drugstore or online. When choosing one: ?Choose one with an arm cuff. ?Choose one that wraps around your upper arm. Only one finger should fit between   your arm and the cuff. ?Do not choose one that measures your blood pressure from your wrist or finger. ?Where to find more information ?American Heart Association: www.heart.org ?Contact a doctor if: ?Your blood pressure keeps being high. ?Your blood pressure is suddenly low. ?Get help right away if: ?Your first blood pressure number is higher than 180. ?Your second blood pressure number is higher than  120. ?Summary ?Check your blood pressure at the same time every day. ?Avoid caffeine, alcohol, smoking, and exercise for 30 minutes before checking your blood pressure. ?Make sure you understand what your blood pressure numbers should be. ?This information is not intended to replace advice given to you by your health care provider. Make sure you discuss any questions you have with your health care provider. ?Document Revised: 09/28/2020 Document Reviewed: 11/13/2019 ?Elsevier Patient Education ? 2022 [employer] ? ?

## 2022-01-22 ENCOUNTER — Other Ambulatory Visit: Payer: Self-pay

## 2022-01-22 ENCOUNTER — Encounter: Payer: Self-pay | Admitting: Nurse Practitioner

## 2022-01-22 ENCOUNTER — Inpatient Hospital Stay: Payer: Medicare Other | Attending: Oncology | Admitting: Nurse Practitioner

## 2022-01-22 ENCOUNTER — Inpatient Hospital Stay: Payer: Medicare Other

## 2022-01-22 VITALS — BP 153/87 | Temp 98.3°F | Wt 130.4 lb

## 2022-01-22 DIAGNOSIS — Z7901 Long term (current) use of anticoagulants: Secondary | ICD-10-CM | POA: Diagnosis not present

## 2022-01-22 DIAGNOSIS — Z86711 Personal history of pulmonary embolism: Secondary | ICD-10-CM | POA: Insufficient documentation

## 2022-01-22 DIAGNOSIS — Z853 Personal history of malignant neoplasm of breast: Secondary | ICD-10-CM

## 2022-01-22 DIAGNOSIS — Z5181 Encounter for therapeutic drug level monitoring: Secondary | ICD-10-CM

## 2022-01-22 DIAGNOSIS — C50211 Malignant neoplasm of upper-inner quadrant of right female breast: Secondary | ICD-10-CM | POA: Insufficient documentation

## 2022-01-22 DIAGNOSIS — Z923 Personal history of irradiation: Secondary | ICD-10-CM | POA: Diagnosis not present

## 2022-01-22 DIAGNOSIS — Z79811 Long term (current) use of aromatase inhibitors: Secondary | ICD-10-CM

## 2022-01-22 DIAGNOSIS — M810 Age-related osteoporosis without current pathological fracture: Secondary | ICD-10-CM | POA: Diagnosis not present

## 2022-01-22 DIAGNOSIS — Z08 Encounter for follow-up examination after completed treatment for malignant neoplasm: Secondary | ICD-10-CM

## 2022-01-22 LAB — CBC WITH DIFFERENTIAL/PLATELET
Abs Immature Granulocytes: 0.02 10*3/uL (ref 0.00–0.07)
Basophils Absolute: 0 10*3/uL (ref 0.0–0.1)
Basophils Relative: 0 %
Eosinophils Absolute: 0.2 10*3/uL (ref 0.0–0.5)
Eosinophils Relative: 3 %
HCT: 40.1 % (ref 36.0–46.0)
Hemoglobin: 13.1 g/dL (ref 12.0–15.0)
Immature Granulocytes: 0 %
Lymphocytes Relative: 16 %
Lymphs Abs: 0.9 10*3/uL (ref 0.7–4.0)
MCH: 31.7 pg (ref 26.0–34.0)
MCHC: 32.7 g/dL (ref 30.0–36.0)
MCV: 97.1 fL (ref 80.0–100.0)
Monocytes Absolute: 0.5 10*3/uL (ref 0.1–1.0)
Monocytes Relative: 8 %
Neutro Abs: 4 10*3/uL (ref 1.7–7.7)
Neutrophils Relative %: 73 %
Platelets: 186 10*3/uL (ref 150–400)
RBC: 4.13 MIL/uL (ref 3.87–5.11)
RDW: 13.4 % (ref 11.5–15.5)
WBC: 5.6 10*3/uL (ref 4.0–10.5)
nRBC: 0 % (ref 0.0–0.2)

## 2022-01-22 LAB — COMPREHENSIVE METABOLIC PANEL
ALT: 15 U/L (ref 0–44)
AST: 19 U/L (ref 15–41)
Albumin: 4.1 g/dL (ref 3.5–5.0)
Alkaline Phosphatase: 63 U/L (ref 38–126)
Anion gap: 6 (ref 5–15)
BUN: 18 mg/dL (ref 8–23)
CO2: 27 mmol/L (ref 22–32)
Calcium: 9.5 mg/dL (ref 8.9–10.3)
Chloride: 103 mmol/L (ref 98–111)
Creatinine, Ser: 0.65 mg/dL (ref 0.44–1.00)
GFR, Estimated: >60 mL/min (ref >60)
Glucose, Bld: 99 mg/dL (ref 70–99)
Potassium: 3.6 mmol/L (ref 3.5–5.1)
Sodium: 136 mmol/L (ref 135–145)
Total Bilirubin: 0.3 mg/dL (ref 0.3–1.2)
Total Protein: 7 g/dL (ref 6.5–8.1)

## 2022-01-22 MED ORDER — DENOSUMAB 60 MG/ML ~~LOC~~ SOSY
60.0000 mg | PREFILLED_SYRINGE | Freq: Once | SUBCUTANEOUS | Status: AC
  Administered 2022-01-22: 60 mg via SUBCUTANEOUS
  Filled 2022-01-22: qty 1

## 2022-01-22 NOTE — Progress Notes (Incomplete)
[place] at [facility], [place] Phone: [phone]   Clinic Day:  01/22/2022  Referring physician: Jearld Fenton, NP  Chief Complaint: LATONA KRICHBAUM is a 88 y.o. female presents for follow up of stage I right breast cancer and osteoporosis- on prolia  PERTINENT ONCOLOGY HISTORY Patient previously followed up by Dr.Corcoran, patient switched care to me on 07/12/21 Extensive medical record review was performed by me  Stage T1aNx right breast cancer s/p lumpectomy on 10/10/2018. Pathology revealed a 0.4 cm grade I invasive ductal carcinoma with mucinous features.  There was intermediate grade DCIS.  Margins were uninvolved (1.0 cm posterior margin).  There were fibrocystic changes including apocrine metaplasia.  Tumor was ER + (100%), PR + (90%), and Her2/neu-.  Ki67 was 10%.  Pathologic stage was T1aNx.  CA27.29 was 14.1 on 09/08/2018.    12/16/2018 - 02/03/2019.She received radiation. She received 5040 cGy in 28 fractions with a scar boost of another 1400 cGy using electron beam.   06/09/2019.  Patient is started on letrozole.  #Osteoporosis 07/31/2018 DEXA revealed osteoporosis with a T-score of -3.4 in the right forearm radius and -1.9 in the right femoral neck.   08/09/2020 DEXA revealed osteoporosis with a T-score of -3.2 in the forearm radius.  Right femoral neck revealed osteopenia with a T-score of -2.0.   07/07/2019 patient started Prolia every 6 months.  #History of pulmonary embolism x2.  Initial event occurred in 2011.  Recurrent pulmonary embolism after holding anticoagulation for 1 month for planned colonoscopy.  Patient is currently on lifelong anticoagulation with Coumadin.  Managed by primary care provider.  #Family history of breast cancer-[daughter and niece].  [facility] genetic testing was negative.  #Patient has history of seizure and previously on Tegretol and is currently off.  She has not had any recent seizure/spells she follows  up with neurology Dr. Melrose Nakayama. chronic ataxia/sensory loss/difficulty walking.  Neurology note was reviewed.     INTERVAL HISTORY JOSSILYN BENDA is a 88 y.o. female with above history who returns to clinic for continued surveillance of stage I right breast cancer and consideration of prolia for osteoporosis. She continues to feel well and denies complaints. No new breast lumps, bumps, pain. No specific complaints today.    Past Medical History:  Diagnosis Date   Colon polyps    Family history of breast cancer    History of pulmonary embolism 2011   Hx of melanoma of skin 2003   Right leg   Hypertension    Melanoma ([place]) 2003   right lower leg. Treated in [place].   Personal history of radiation therapy    Seizures ([place]) 1993   no meds since (approx) 2014   Vertigo    x1. R/T perforated ear drum prior to 2013    Past Surgical History:  Procedure Laterality Date   ABDOMINAL HYSTERECTOMY  05/2014   BREAST BIOPSY Right 08/20/2018   invasive mammary carcinoma/DCIS    BREAST LUMPECTOMY Right 10/10/2018   BREAST LUMPECTOMY WITH RADIOACTIVE SEED LOCALIZATION Right 10/10/2018   Procedure: BREAST LUMPECTOMY WITH RADIOACTIVE SEED LOCALIZATION;  Surgeon: Jovita Kussmaul, MD;  Location: [facility];  Service: General;  Laterality: Right;   CATARACT EXTRACTION W/PHACO Right 01/06/2018   Procedure: CATARACT EXTRACTION PHACO AND INTRAOCULAR LENS PLACEMENT ([place]) RIGHT;  Surgeon: Leandrew Koyanagi, MD;  Location: [facility];  Service: Ophthalmology;  Laterality: Right;   CATARACT EXTRACTION W/PHACO Left 01/29/2018   Procedure: CATARACT EXTRACTION PHACO AND INTRAOCULAR LENS PLACEMENT ([facility])  LEFT;  Surgeon: Leandrew Koyanagi, MD;  Location: [facility];  Service: Ophthalmology;  Laterality: Left;   left hand surgery  05/2011   s/p fall   OTHER SURGICAL HISTORY  2003   Removal of lymphoma from melonoma 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     Family History  Problem Relation Age of Onset    Heart disease Mother    Aortic aneurysm Father    Breast cancer Daughter 6   Breast cancer Niece 27    Social History:  reports that she has never smoked. She has never used smokeless tobacco. She reports that she does not drink alcohol and does not use drugs.  Patient moved from [place] to [place].  Her daughter and grandchildren live in [place].  She has other children in [place]. 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   Allergies:  Allergies  Allergen Reactions   Clobetasol Other (See Comments)    vertigo   Clobetasol Propionate Other (See Comments)    This is ointment form UNSPECIFIED REACTION    Phenergan [Promethazine Hcl] Other (See Comments)    Rapid heart rate   Dextromethorphan Palpitations and Other (See Comments)    promethazine with DM, rapid hearbeat   Zithromax [Azithromycin] Palpitations and Other (See Comments)    Per OSH report, pt unable to recall Rapid heart rate    Current Medications: Current Outpatient Medications  Medication Sig Dispense Refill   aspirin 81 MG tablet Take 81 mg by mouth daily.     Cholecalciferol (VITAMIN D3) 2000 UNITS TABS Take 2,000 Units by mouth daily.      denosumab (PROLIA) 60 MG/ML SOSY injection Inject 60 mg into the skin every 6 (six) months.     ketoconazole (NIZORAL) 2 % cream Apply to rash under breasts 2 times a day until rash improved. 60 g 2   letrozole (FEMARA) 2.5 MG tablet TAKE 1 TABLET BY MOUTH EVERY DAY 90 tablet 3   lisinopril (ZESTRIL) 20 MG tablet Take 1.5 tablets (30 mg total) by mouth daily. 135 tablet 3   metoprolol tartrate (LOPRESSOR) 25 MG tablet TAKE 1 TABLET BY MOUTH TWICE A DAY 180 tablet 2   polyethylene glycol (MIRALAX / GLYCOLAX) packet Take 17 g by mouth every other day.     vitamin B-12 (CYANOCOBALAMIN) 1000 MCG tablet Take 1,000 mcg by mouth daily.     warfarin (COUMADIN) 5 MG tablet TAKE ONE TABLET BY MOUTH DAILY EXCEPT TAKE 7.$RemoveBefor'5MG'ShmdKJRMpxVc$  ON MONDAYS AND THURSDAYS OR  AS DIRECTED BY COUMADIN CLINIC 105 tablet 1   warfarin (COUMADIN) 7.5 MG tablet Take one tablet, 7.$RemoveBefor'5mg'BLBrvOGzhbUy$ ,  by mouth on Mondays and Thursday or as directed by anticoagulation clinic. 70 tablet 1   warfarin (COUMADIN) 7.5 MG tablet Take one tablet, 7.$RemoveBefor'5mg'KqdvzUadIdpT$ ,  by mouth on Mondays and Thursdays or as directed by anticoagulation clinic. 70 tablet 1   Wheat Dextrin (BENEFIBER PO) Take 2 each by mouth every other day.     No current facility-administered medications for this visit.   Review of Systems  Constitutional:  Negative for chills, fever, malaise/fatigue and weight loss.  HENT:  Negative for hearing loss, nosebleeds, sore throat and tinnitus.   Eyes:  Negative for blurred vision and double vision.  Respiratory:  Negative for cough, hemoptysis, shortness of breath and wheezing.   Cardiovascular:  Negative for chest pain, palpitations and leg swelling.  Gastrointestinal:  Negative for abdominal pain, blood in stool, constipation, diarrhea, melena, nausea and vomiting.  Genitourinary:  Negative for dysuria and urgency.  Musculoskeletal:  Negative for back pain, falls, joint pain and myalgias.  Skin:  Negative for itching and rash.  Neurological:  Negative for dizziness, tingling, sensory change, loss of consciousness, weakness and headaches.  Endo/Heme/Allergies:  Negative for environmental allergies. Does not bruise/bleed easily.  Psychiatric/Behavioral:  Negative for depression. The patient is not nervous/anxious and does not have insomnia.   Performance status (ECOG): 1  Vitals Blood pressure (!) 153/87, temperature 98.3 F (36.8 C), temperature source Tympanic, weight 130 lb 6.4 oz (59.1 kg).   Physical Exam Vitals reviewed.  Constitutional:      Appearance: She is not ill-appearing.  HENT:     Head: Normocephalic.  Eyes:     General: No scleral icterus. Cardiovascular:     Rate and Rhythm: Normal rate and regular rhythm.  Pulmonary:     Effort: Pulmonary effort is normal. No  respiratory distress.     Breath sounds: Normal breath sounds.  Abdominal:     General: There is no distension.     Tenderness: There is no abdominal tenderness. There is no guarding.  Musculoskeletal:        General: No swelling or deformity.     Cervical back: Neck supple.  Lymphadenopathy:     Cervical: No cervical adenopathy.  Skin:    General: Skin is warm and dry.     Coloration: Skin is not pale.     Findings: No rash.  Neurological:     Mental Status: She is alert and oriented to person, place, and time.  Psychiatric:        Mood and Affect: Mood normal.        Behavior: Behavior normal.  Breast exam - deferred   Appointment on 01/22/2022  Component Date Value Ref Range Status   Sodium 01/22/2022 136  135 - 145 mmol/L Final   Potassium 01/22/2022 3.6  3.5 - 5.1 mmol/L Final   Chloride 01/22/2022 103  98 - 111 mmol/L Final   CO2 01/22/2022 27  22 - 32 mmol/L Final   Glucose, Bld 01/22/2022 99  70 - 99 mg/dL Final   Glucose reference range applies only to samples taken after fasting for at least 8 hours.   BUN 01/22/2022 18  8 - 23 mg/dL Final   Creatinine, Ser 01/22/2022 0.65  0.44 - 1.00 mg/dL Final   Calcium 01/22/2022 9.5  8.9 - 10.3 mg/dL Final   Total Protein 01/22/2022 7.0  6.5 - 8.1 g/dL Final   Albumin 01/22/2022 4.1  3.5 - 5.0 g/dL Final   AST 01/22/2022 19  15 - 41 U/L Final   ALT 01/22/2022 15  0 - 44 U/L Final   Alkaline Phosphatase 01/22/2022 63  38 - 126 U/L Final   Total Bilirubin 01/22/2022 0.3  0.3 - 1.2 mg/dL Final   GFR, Estimated 01/22/2022 >60  >60 mL/min Final   Comment: (NOTE) Calculated using the CKD-EPI Creatinine Equation (2021)    Anion gap 01/22/2022 6  5 - 15 Final   Performed at [facility], [place], [place] [address]   WBC 01/22/2022 5.6  4.0 - 10.5 K/uL Final   RBC 01/22/2022 4.13  3.87 - 5.11 MIL/uL Final   Hemoglobin 01/22/2022 13.1  12.0 - 15.0 g/dL Final   HCT 01/22/2022 40.1  36.0 -  46.0 % Final   MCV 01/22/2022 97.1  80.0 - 100.0 fL Final   MCH 01/22/2022 31.7  26.0 - 34.0 pg Final  MCHC 01/22/2022 32.7  30.0 - 36.0 g/dL Final   RDW 01/22/2022 13.4  11.5 - 15.5 % Final   Platelets 01/22/2022 186  150 - 400 K/uL Final   nRBC 01/22/2022 0.0  0.0 - 0.2 % Final   Neutrophils Relative % 01/22/2022 73  % Final   Neutro Abs 01/22/2022 4.0  1.7 - 7.7 K/uL Final   Lymphocytes Relative 01/22/2022 16  % Final   Lymphs Abs 01/22/2022 0.9  0.7 - 4.0 K/uL Final   Monocytes Relative 01/22/2022 8  % Final   Monocytes Absolute 01/22/2022 0.5  0.1 - 1.0 K/uL Final   Eosinophils Relative 01/22/2022 3  % Final   Eosinophils Absolute 01/22/2022 0.2  0.0 - 0.5 K/uL Final   Basophils Relative 01/22/2022 0  % Final   Basophils Absolute 01/22/2022 0.0  0.0 - 0.1 K/uL Final   Immature Granulocytes 01/22/2022 0  % Final   Abs Immature Granulocytes 01/22/2022 0.02  0.00 - 0.07 K/uL Final   Performed at [facility], [address] [place], [place] [address]    Assessment and Plan No diagnosis found.   Stage T1aNx right breast cancer - pathology consistent with invasive ductal carcinoma with mucinous features.   # Stage T1aNx right breast cancer [ invasive ductal carcinoma with mucinous features] -08/2018  s/p surgery and radiation Labs are reviewed and discussed with patient. Continue Letrozole. Discussed with patient of recommendation of 5 years if she tolerates.  Clinically she has no signs of disease recurrence. .   Bilateral mammogram on 08/09/2020 revealed no evidence of recurrent disease.  Due for annual mammogram in Sept.2022.  CA 27.29 has been followed up in the past. Not clear clinic utility of tumor marker in her case.  Will check annually in stead of every visit.   # Osteoporosis Recommend patient to continue calcium and vitamin D supplementation. . Proceed with Prolia today.   # History of recurrent PE, continue long term anticoagulation  with coumadin. Follows up with coumadin clinic.   # RTC in 6 months for MD assess, labs (CBC with diff, CMP), and Prolia.  I discussed the assessment and treatment plan with the patient. The patient was provided an opportunity to ask questions and all were answered. The patient agreed with the plan and demonstrated an understanding of the instructions. The patient was advised to call back if the symptoms worsen or if the condition fails to improve as anticipated.   We spent sufficient time to discuss many aspect of care, questions were answered to patient's satisfaction. A total of 40 minutes was spent on this visit.  With 15 minutes spent reviewing image findings, pathology reports, 20 minutes counseling the patient on the diagnosis, treatment plan, side effects of the treatment, management of symptoms.  Additional 5 minutes was spent on answering patient's questions.  Earlie Server, MD, PhD Hematology Oncology [place] at [facility] 01/22/2022

## 2022-01-25 DIAGNOSIS — Z86718 Personal history of other venous thrombosis and embolism: Secondary | ICD-10-CM | POA: Diagnosis not present

## 2022-01-25 DIAGNOSIS — D802 Selective deficiency of immunoglobulin A [IgA]: Secondary | ICD-10-CM | POA: Diagnosis not present

## 2022-01-25 DIAGNOSIS — Z86711 Personal history of pulmonary embolism: Secondary | ICD-10-CM | POA: Diagnosis not present

## 2022-01-25 DIAGNOSIS — Z7901 Long term (current) use of anticoagulants: Secondary | ICD-10-CM | POA: Diagnosis not present

## 2022-01-25 LAB — POCT INR: INR: 3.3 — AB (ref 0.9–1.1)

## 2022-01-30 ENCOUNTER — Encounter: Payer: Self-pay | Admitting: Internal Medicine

## 2022-01-31 ENCOUNTER — Telehealth: Payer: Self-pay

## 2022-01-31 NOTE — Telephone Encounter (Signed)
Copied from [place] [id number]. Topic: General - Other ?>> Jan 30, 2022  1:38 PM Valere Dross wrote: ?Reason for CRM: Pt called in stating PCP told her to check on her BP everyday and report that on today. Pt was requesting a call back to let her know those readings. Please advise. ?

## 2022-01-31 NOTE — Telephone Encounter (Signed)
Called pt, these are her recent BP:  ? ? ?01/19/2022 140/90 ?01/22/2022 130/80 ?01/24/2022 140/90 ?01/26/2022 128/86 ?01/29/2022 160/90 and 140/100 ? ?Thanks,  ? ?-Mickel Baas  ?

## 2022-02-01 NOTE — Telephone Encounter (Signed)
A little high on the 27th otherwise reasonable. Will continue to monitor at this time. ?

## 2022-02-01 NOTE — Telephone Encounter (Signed)
LMTCB 02/01/2022.  PEC please advise pt as below when she calls back.  ? ?Thanks,  ? ?-Mickel Baas ?

## 2022-02-02 NOTE — Telephone Encounter (Signed)
Pt returned call, provider comments and instructions given to pt. Questions answered, verbalized understanding. Will continue to monitor BP MWF and call with results. ?

## 2022-02-12 DIAGNOSIS — H6123 Impacted cerumen, bilateral: Secondary | ICD-10-CM | POA: Diagnosis not present

## 2022-02-12 DIAGNOSIS — R42 Dizziness and giddiness: Secondary | ICD-10-CM | POA: Diagnosis not present

## 2022-02-12 DIAGNOSIS — R490 Dysphonia: Secondary | ICD-10-CM | POA: Diagnosis not present

## 2022-02-15 DIAGNOSIS — Z86711 Personal history of pulmonary embolism: Secondary | ICD-10-CM | POA: Diagnosis not present

## 2022-02-15 DIAGNOSIS — D802 Selective deficiency of immunoglobulin A [IgA]: Secondary | ICD-10-CM | POA: Diagnosis not present

## 2022-02-15 DIAGNOSIS — Z86718 Personal history of other venous thrombosis and embolism: Secondary | ICD-10-CM | POA: Diagnosis not present

## 2022-02-15 DIAGNOSIS — Z7901 Long term (current) use of anticoagulants: Secondary | ICD-10-CM | POA: Diagnosis not present

## 2022-02-15 LAB — POCT INR: INR: 2.1 — AB (ref 0.80–1.20)

## 2022-02-16 ENCOUNTER — Telehealth: Payer: Self-pay | Admitting: Internal Medicine

## 2022-02-16 NOTE — Telephone Encounter (Signed)
Sharyn Lull advised.  ? ?Thanks,  ? ?-Mickel Baas  ?

## 2022-02-16 NOTE — Telephone Encounter (Signed)
Left message for Sharyn Lull to call back.    ? ? ?PER Regina:  ? ?Continue current coumadin dose recheck PT/INT in 1 month.   ? ? ?PEC please advise when she calls backs. ? ? ?Thanks,  ? ?-Mickel Baas  ?

## 2022-02-16 NOTE — Telephone Encounter (Signed)
Copied from [place] [id number]. Topic: General - Inquiry ?>> Feb 16, 2022 11:27 AM Oneta Rack wrote: ?CMA faxed over patient INR / PT values on 02/15/2022 to [phone], note if received. On 02/15/2022 patient INR  was 2.1 and PT 20.1 ?

## 2022-02-23 ENCOUNTER — Encounter: Payer: Self-pay | Admitting: Internal Medicine

## 2022-03-13 ENCOUNTER — Encounter: Payer: Medicare Other | Admitting: Dermatology

## 2022-03-19 DIAGNOSIS — Z86711 Personal history of pulmonary embolism: Secondary | ICD-10-CM | POA: Diagnosis not present

## 2022-03-19 DIAGNOSIS — D802 Selective deficiency of immunoglobulin A [IgA]: Secondary | ICD-10-CM | POA: Diagnosis not present

## 2022-03-19 DIAGNOSIS — Z86718 Personal history of other venous thrombosis and embolism: Secondary | ICD-10-CM | POA: Diagnosis not present

## 2022-03-19 DIAGNOSIS — Z7901 Long term (current) use of anticoagulants: Secondary | ICD-10-CM | POA: Diagnosis not present

## 2022-03-19 LAB — POCT INR: INR: 2 — AB (ref 0.80–1.20)

## 2022-03-20 ENCOUNTER — Telehealth: Payer: Self-pay

## 2022-03-20 ENCOUNTER — Ambulatory Visit (INDEPENDENT_AMBULATORY_CARE_PROVIDER_SITE_OTHER): Payer: Medicare Other | Admitting: Dermatology

## 2022-03-20 DIAGNOSIS — L719 Rosacea, unspecified: Secondary | ICD-10-CM

## 2022-03-20 DIAGNOSIS — L304 Erythema intertrigo: Secondary | ICD-10-CM

## 2022-03-20 DIAGNOSIS — I8393 Asymptomatic varicose veins of bilateral lower extremities: Secondary | ICD-10-CM | POA: Diagnosis not present

## 2022-03-20 DIAGNOSIS — H0012 Chalazion right lower eyelid: Secondary | ICD-10-CM

## 2022-03-20 DIAGNOSIS — L578 Other skin changes due to chronic exposure to nonionizing radiation: Secondary | ICD-10-CM | POA: Diagnosis not present

## 2022-03-20 DIAGNOSIS — L570 Actinic keratosis: Secondary | ICD-10-CM

## 2022-03-20 DIAGNOSIS — L820 Inflamed seborrheic keratosis: Secondary | ICD-10-CM

## 2022-03-20 DIAGNOSIS — L814 Other melanin hyperpigmentation: Secondary | ICD-10-CM

## 2022-03-20 DIAGNOSIS — Z1283 Encounter for screening for malignant neoplasm of skin: Secondary | ICD-10-CM | POA: Diagnosis not present

## 2022-03-20 DIAGNOSIS — L821 Other seborrheic keratosis: Secondary | ICD-10-CM

## 2022-03-20 DIAGNOSIS — B353 Tinea pedis: Secondary | ICD-10-CM | POA: Diagnosis not present

## 2022-03-20 DIAGNOSIS — B351 Tinea unguium: Secondary | ICD-10-CM | POA: Diagnosis not present

## 2022-03-20 DIAGNOSIS — L720 Epidermal cyst: Secondary | ICD-10-CM | POA: Diagnosis not present

## 2022-03-20 DIAGNOSIS — D229 Melanocytic nevi, unspecified: Secondary | ICD-10-CM

## 2022-03-20 MED ORDER — KETOCONAZOLE 2 % EX CREA: TOPICAL_CREAM | CUTANEOUS | 2 refills | Status: DC

## 2022-03-20 MED ORDER — METRONIDAZOLE 0.75 % EX CREA: TOPICAL_CREAM | CUTANEOUS | 1 refills | Status: DC

## 2022-03-20 NOTE — Patient Instructions (Addendum)
Start ketoconazole 2% cream - Apply to toes and between toes every night for fungus. ? ?Continue ketoconazole 2% cream to rash under breasts once to twice daily as needed.  ? ?Start metronidazole 0.75% cream - Apply to nose every night for rosacea.  ? ?Cryotherapy Aftercare ? ?Wash gently with soap and water everyday.   ?Apply Vaseline and Band-Aid daily until healed.  ? ?Rosacea ? ?What is rosacea? ?Rosacea (say: ro-zay-sha) is a common skin disease that usually begins as a trend of flushing or blushing easily.  As rosacea progresses, a persistent redness in the center of the face will develop and may gradually spread beyond the nose and cheeks to the forehead and chin.  In some cases, the ears, chest, and back could be affected.  Rosacea may appear as tiny blood vessels or small red bumps that occur in crops.  Frequently they can contain pus, and are called ?pustules?.  If the bumps do not contain pus, they are referred to as ?papules?.  Rarely, in prolonged, untreated cases of rosacea, the oil glands of the nose and cheeks may become permanently enlarged.  This is called rhinophyma, and is seen more frequently in men. ? ?Signs and Risks ?In its beginning stages, rosacea tends to come and go, which makes it difficult to recognize.  It can start as intermittent flushing of the face.  Eventually, blood vessels may become permanently visible.  Pustules and papules can appear, but can be mistaken for adult acne.  People of all races, ages, genders and ethnic groups are at risk of developing rosacea.  However, it is more common in women (especially around menopause) and adults with fair skin between the ages of 58 and 45. ? ?Treatment ?Dermatologists typically recommend a combination of treatments to effectively manage rosacea.  Treatment can improve symptoms and may stop the progression of the rosacea.  Treatment may involve both topical and oral medications.  The tetracycline antibiotics are often used for their  anti-inflammatory effect; however, because of the possibility of developing antibiotic resistance, they should not be used long term at full dose.  For dilated blood vessels the options include electrodessication (uses electric current through a small needle), laser treatment, and cosmetics to hide the redness.   ?With all forms of treatment, improvement is a slow process, and patients may not see any results for the first 3-4 weeks.  It is very important to avoid the sun and other triggers.  Patients must wear sunscreen daily. ? ?Skin Care Instructions: ?Cleanse the skin with a mild soap such as CeraVe cleanser, Cetaphil cleanser, or Dove soap once or twice daily as needed. ?Moisturize with Eucerin Redness Relief Daily Perfecting Lotion (has a subtle green tint), CeraVe Moisturizing Cream, or Oil of Olay Daily Moisturizer with sunscreen every morning and/or night as recommended. ?Makeup should be ?non-comedogenic? (won?t clog pores) and be labeled ?for sensitive skin?Kermit Balo choices for cosmetics are: Neutrogena, Almay, and Physician?s Formula.  Any product with a green tint tends to offset a red complexion. ?If your eyes are dry and irritated, use artificial tears 2-3 times per day and cleanse the eyelids daily with baby shampoo.  Have your eyes examined at least every 2 years.  Be sure to tell your eye doctor that you have rosacea. ?Alcoholic beverages tend to cause flushing of the skin, and may make rosacea worse. ?Always wear sunscreen, protect your skin from extreme hot and cold temperatures, and avoid spicy foods, hot drinks, and mechanical irritation such as rubbing,  scrubbing, or massaging the face.  Avoid harsh skin cleansers, cleansing masks, astringents, and exfoliation. If a particular product burns or makes your face feel tight, then it is likely to flare your rosacea. ?If you are having difficulty finding a sunscreen that you can tolerate, you may try switching to a chemical-free sunscreen.  These are  ones whose active ingredient is zinc oxide or titanium dioxide only.  They should also be fragrance free, non-comedogenic, and labeled for sensitive skin. ?Rosacea triggers may vary from person to person.  There are a variety of foods that have been reported to trigger rosacea.  Some patients find that keeping a diary of what they were doing when they flared helps them avoid triggers. ? ? ?If You Need Anything After Your Visit ? ?If you have any questions or concerns for your doctor, please call our main line at [phone] and press option 4 to reach your doctor's medical assistant. If no one answers, please leave a voicemail as directed and we will return your call as soon as possible. Messages left after 4 pm will be answered the following business day.  ? ?You may also send [place] a message via MyChart. We typically respond to MyChart messages within 1-2 business days. ? ?For prescription refills, please ask your pharmacy to contact our office. Our fax number is [phone]. ? ?If you have an urgent issue when the clinic is closed that cannot wait until the next business day, you can page your doctor at the number below.   ? ?Please note that while we do our best to be available for urgent issues outside of office hours, we are not available 24/7.  ? ?If you have an urgent issue and are unable to reach [place], you may choose to seek medical care at your doctor's office, retail clinic, urgent care center, or emergency room. ? ?If you have a medical emergency, please immediately call 911 or go to the emergency department. ? ?Pager Numbers ? ?- Dr. Nehemiah Massed: [phone] ? ?- Dr. Laurence Ferrari: [phone] ? ?- Dr. Nicole Kindred: [phone] ? ?In the event of inclement weather, please call our main line at [phone] for an update on the status of any delays or closures. ? ?Dermatology Medication Tips: ?Please keep the boxes that topical medications come in in order to help keep track of the instructions about where and how to use  these. Pharmacies typically print the medication instructions only on the boxes and not directly on the medication tubes.  ? ?If your medication is too expensive, please contact our office at [phone] option 4 or send [place] a message through [place].  ? ?We are unable to tell what your co-pay for medications will be in advance as this is different depending on your insurance coverage. However, we may be able to find a substitute medication at lower cost or fill out paperwork to get insurance to cover a needed medication.  ? ?If a prior authorization is required to get your medication covered by your insurance company, please allow [place] 1-2 business days to complete this process. ? ?Drug prices often vary depending on where the prescription is filled and some pharmacies may offer cheaper prices. ? ?The website www.goodrx.com contains coupons for medications through different pharmacies. The prices here do not account for what the cost may be with help from insurance (it may be cheaper with your insurance), but the website can give you the price if you did not use any insurance.  ?- You can print the  associated coupon and take it with your prescription to the pharmacy.  ?- You may also stop by our office during regular business hours and pick up a GoodRx coupon card.  ?- If you need your prescription sent electronically to a different pharmacy, notify our office through [facility] or by phone at [phone] option 4. ? ? ? ? ?Si Usted Necesita Algo Despu?s de Su Visita ? ?Tambi?n puede enviarnos un mensaje a trav?s de MyChart. Por lo general respondemos a los mensajes de MyChart en el transcurso de 1 a 2 d?as h?biles. ? ?Para renovar recetas, por favor pida a su farmacia que se ponga en contacto con nuestra oficina. Nuestro n?mero de fax es el [phone]. ? ?Si tiene un asunto urgente cuando la cl?nica est? cerrada y que no puede esperar hasta el siguiente d?a h?bil, puede llamar/localizar a su doctor(a) al  n?mero que aparece a continuaci?n.  ? ?Por favor, tenga en cuenta que aunque hacemos todo lo posible para estar disponibles para asuntos urgentes fuera del horario de oficina, no estamos disponibles las

## 2022-03-20 NOTE — Telephone Encounter (Signed)
[name], RN Indepent Living Coordinator calling to report lab results from yesterday. INR 2.0, PT 19.3. She states she also faxed the report with confirmation it was sent. I advised I will pass this along to provider to f/up.  ?

## 2022-03-20 NOTE — Telephone Encounter (Signed)
Call independent living RN. Repeat PT/INR in 1 month ?

## 2022-03-20 NOTE — Progress Notes (Signed)
? ?Follow-Up Visit ?  ?Subjective  ?Becky Farrell is a 88 y.o. female who presents for the following: Annual Exam. ? ?The patient presents for Total-Body Skin Exam (TBSE) for skin cancer screening and mole check.  The patient has spots, moles and lesions to be evaluated, some may be new or changing. Some are irritated (Face, chest). She has intertrigo of the inframammary, clear today, but worsens in the summer months. She uses ketoconazole 2% cream. She has a history of melanoma of the right lower leg treated in 2003. ? ?The following portions of the chart were reviewed this encounter and updated as appropriate:  ?  ?  ? ?Review of Systems:  No other skin or systemic complaints except as noted in HPI or Assessment and Plan. ? ?Objective  ?Well appearing patient in no apparent distress; mood and affect are within normal limits. ? ?A full examination was performed including scalp, head, eyes, ears, nose, lips, neck, chest, axillae, abdomen, back, buttocks, bilateral upper extremities, bilateral lower extremities, hands, feet, fingers, toes, fingernails, and toenails. All findings within normal limits unless otherwise noted below. ? ?Inframammary ?Clear today.  ? ?left chin at jaw x 1,  chest x 4 (5), right medial ankle (not treated) ?Keratotic papules of the chest, left chin;  keratotic tan flat papule of the right medial ankle ? ?L nasal tip ?Pink scaly macules ? ?Nose ?Erythema with pink papules on the nose ? ?Right Lower Mid Eyelid ?Erythematous papule ? ? ? ? ?toenails, feet ?100% Toenail dystrophy (yellow thickened nails) with scaling between toes. ? ?right lateral eye ?White subcutaneous nodule.  ? ? ? ?Assessment & Plan  ?Skin cancer screening performed today. ? ?Actinic Damage ?- chronic, secondary to cumulative UV radiation exposure/sun exposure over time ?- diffuse scaly erythematous macules with underlying dyspigmentation ?- Recommend daily broad spectrum sunscreen SPF 30+ to sun-exposed areas, reapply  every 2 hours as needed.  ?- Recommend staying in the shade or wearing long sleeves, sun glasses (UVA+UVB protection) and wide brim hats (4-inch brim around the entire circumference of the hat). ?- Call for new or changing lesions. ? ?Lentigines ?- Scattered tan macules ?- Due to sun exposure ?- Benign-appering, observe ?- Recommend daily broad spectrum sunscreen SPF 30+ to sun-exposed areas, reapply every 2 hours as needed. ?- Call for any changes ? ?Seborrheic Keratoses ?- Stuck-on, waxy, tan-brown papules and/or plaques  ?- Benign-appearing ?- Discussed benign etiology and prognosis. ?- Observe ?- Call for any changes ? ?Melanocytic Nevi ?- Tan-brown and/or pink-flesh-colored symmetric macules and papules ?- Benign appearing on exam today ?- Observation ?- Call clinic for new or changing moles ?- Recommend daily use of broad spectrum spf 30+ sunscreen to sun-exposed areas.  ? ?Erythema intertrigo ?Inframammary ? ?Intertrigo is a chronic recurrent rash that occurs in skin fold areas that may be associated with friction; heat; moisture; yeast; fungus; and bacteria.  It is exacerbated by increased movement / activity; sweating; and higher atmospheric temperature. ? ?Clear today. ? ?Continue ketoconazole 2% cream Apply qd/bid AA inframammary prn. ? ?ketoconazole (NIZORAL) 2 % cream - Inframammary ?Apply to rash under breasts 2 times a day until rash improved. ? ?Inflamed seborrheic keratosis (6) ?left chin at jaw x 1,  chest x 4 (5); right medial ankle (not treated) ? ?Destruction of lesion - left chin at jaw x 1,  chest x 4 ? ?Destruction method: cryotherapy   ?Informed consent: discussed and consent obtained   ?Lesion destroyed using liquid nitrogen: Yes   ?  Region frozen until ice ball extended beyond lesion: Yes   ?Outcome: patient tolerated procedure well with no complications   ?Post-procedure details: wound care instructions given   ?Additional details:  Prior to procedure, discussed risks of blister formation,  small wound, skin dyspigmentation, or rare scar following cryotherapy. Recommend Vaseline ointment to treated areas while healing. ? ? ?AK (actinic keratosis) ?L nasal tip ? ?Actinic keratoses are precancerous spots that appear secondary to cumulative UV radiation exposure/sun exposure over time. They are chronic with expected duration over 1 year. A portion of actinic keratoses will progress to squamous cell carcinoma of the skin. It is not possible to reliably predict which spots will progress to skin cancer and so treatment is recommended to prevent development of skin cancer. ? ?Recommend daily broad spectrum sunscreen SPF 30+ to sun-exposed areas, reapply every 2 hours as needed.  ?Recommend staying in the shade or wearing long sleeves, sun glasses (UVA+UVB protection) and wide brim hats (4-inch brim around the entire circumference of the hat). ?Call for new or changing lesions. ? ?Destruction of lesion - L nasal tip ? ?Destruction method: cryotherapy   ?Informed consent: discussed and consent obtained   ?Lesion destroyed using liquid nitrogen: Yes   ?Region frozen until ice ball extended beyond lesion: Yes   ?Outcome: patient tolerated procedure well with no complications   ?Post-procedure details: wound care instructions given   ?Additional details:  Prior to procedure, discussed risks of blister formation, small wound, skin dyspigmentation, or rare scar following cryotherapy. Recommend Vaseline ointment to treated areas while healing. ? ? ?Rosacea ?Nose ? ?Chronic and persistent condition with duration or expected duration over one year. Condition is bothersome/symptomatic for patient. Currently flared.  ? ?Rosacea is a chronic progressive skin condition usually affecting the face of adults, causing redness and/or acne bumps. It is treatable but not curable. It sometimes affects the eyes (ocular rosacea) as well. It may respond to topical and/or systemic medication and can flare with stress, sun exposure,  alcohol, exercise and some foods.  Daily application of broad spectrum spf 30+ sunscreen to face is recommended to reduce flares. ? ?Start metronidazole 0.75% cream Apply to nose qhs dsp 45g 1Rf. ? ?metroNIDAZOLE (METROCREAM) 0.75 % cream - Nose ?Apply to nose every night for rosacea. ? ?Chalazion of right lower eyelid ?Right Lower Mid Eyelid ? ?If not clearing with warm compresses, consider doxycycline treatment. Patient defers oral antibiotic today and will discuss with Dr Wallace Going at her next visit. Consider biopsy if not clearing with treatment.  ? ?Onychomycosis ?toenails, feet ? ?With Tinea Pedis, chronic condition ? ?Start ketoconazole 2% cream Apply to feet and between toes qhs.  Also to toenails to prevent spread to skin ? ?Avoid wearing socks to bed ? ?Epidermal cyst ?right lateral eye ? ?Benign-appearing. Exam most consistent with an epidermal inclusion cyst. Discussed that a cyst is a benign growth that can grow over time and sometimes get irritated or inflamed. Recommend observation if it is not bothersome. Discussed option of surgical excision to remove it if it is growing, symptomatic, or other changes noted. Please call for new or changing lesions so they can be evaluated. ? ? ? ?Varicose Veins/Spider Veins ?- Dilated blue, purple or red veins at the lower extremities ?- Reassured ?- Smaller vessels can be treated by sclerotherapy (a procedure to inject a medicine into the veins to make them disappear) if desired, but the treatment is not covered by insurance. Larger vessels may be covered if symptomatic and  we would refer to vascular surgeon if treatment desired. ? ?Return in about 1 year (around 03/21/2023) for TBSE, Hx melanoma. ? ?I, Jamesetta Orleans, CMA, am acting as scribe for Brendolyn Patty, MD . ? ?Documentation: I have reviewed the above documentation for accuracy and completeness, and I agree with the above. ? ?Brendolyn Patty MD  ? ?

## 2022-03-20 NOTE — Telephone Encounter (Signed)
LMTCB 03/20/2022.  [name] at [phone])  PEC Please advise when she calls back.  ? ?Thanks,  ? ?-Mickel Baas  ?

## 2022-03-21 ENCOUNTER — Encounter: Payer: Self-pay | Admitting: Internal Medicine

## 2022-03-21 NOTE — Telephone Encounter (Signed)
Attempted to call Janci Minor, RN: ?Left message to call office for provider response ?

## 2022-03-21 NOTE — Telephone Encounter (Signed)
Janci Minor,RN returned call-  ?Advised of provider request: Call independent living RN. Repeat PT/INR in 1 month ? ?Advised if patient needs instruction or changes in dosing- please advise patient- she is in independent living and adjust her medications herself. ?

## 2022-03-22 ENCOUNTER — Ambulatory Visit
Admission: RE | Admit: 2022-03-22 | Discharge: 2022-03-22 | Disposition: A | Discharge status: Home / Self Care | Payer: Medicare Other | Source: Ambulatory Visit | Attending: Radiation Oncology | Admitting: Radiation Oncology

## 2022-03-22 ENCOUNTER — Telehealth: Payer: Self-pay | Admitting: *Deleted

## 2022-03-22 ENCOUNTER — Encounter: Payer: Self-pay | Admitting: Radiation Oncology

## 2022-03-22 VITALS — BP 163/90 | HR 61 | Temp 97.2°F | Resp 18 | Ht 63.0 in | Wt 131.7 lb

## 2022-03-22 DIAGNOSIS — C50211 Malignant neoplasm of upper-inner quadrant of right female breast: Secondary | ICD-10-CM

## 2022-03-22 DIAGNOSIS — Z923 Personal history of irradiation: Secondary | ICD-10-CM | POA: Insufficient documentation

## 2022-03-22 DIAGNOSIS — Z79811 Long term (current) use of aromatase inhibitors: Secondary | ICD-10-CM | POA: Insufficient documentation

## 2022-03-22 DIAGNOSIS — Z08 Encounter for follow-up examination after completed treatment for malignant neoplasm: Secondary | ICD-10-CM | POA: Diagnosis not present

## 2022-03-22 DIAGNOSIS — Z170 Estrogen receptor positive status [ER+]: Secondary | ICD-10-CM | POA: Insufficient documentation

## 2022-03-22 DIAGNOSIS — Z853 Personal history of malignant neoplasm of breast: Secondary | ICD-10-CM | POA: Diagnosis not present

## 2022-03-22 NOTE — Telephone Encounter (Signed)
Patient called for her lab results: ?Informed per notes-03/20/22 ?Mabe, Lynnea Maizes, RN ?  ?11:43 AM ?Note ?  ?[name], RN Indepent Living Coordinator calling to report lab results from yesterday. INR 2.0, PT 19.3.   ?  ?Patient inquired about her Coumadin dosing- unable to find changes from last visit 01/16/22 in chart. Patient states she was directed to take 5 mg daily- no varying dose. Patient advised I would alert provider and  verify that is correct. Please call her back after provider review  ?

## 2022-03-22 NOTE — Progress Notes (Signed)
Radiation Oncology ?Follow up Note ? ?Name: Becky Farrell   ?Date:   03/22/2022 ?MRN:  [id number] ?DOB: 05/05/1934  ? ? ?This 88 y.o. female presents to the clinic today for 3-year follow-up status post whole breast radiation to her right breast for stage I ER/PR positive invasive mammary carcinoma. ? ?REFERRING PROVIDER: Jearld Fenton, NP ? ?HPI: Patient is an 88 year old female now out 3 years having pleated whole breast radiation to her right breast for stage I ER/PR positive invasive mammary carcinoma.  She is doing well.  She specifically denies breast tenderness cough or bone pain.  She has multiple medical comorbidities including dizziness and heart palpitations.  She is currently on letrozole tolerating it well without side effect..  She had mammograms back in September which I have reviewed were BI-RADS 1 benign. ? ?COMPLICATIONS OF TREATMENT: none ? ?FOLLOW UP COMPLIANCE: keeps appointments  ? ?PHYSICAL EXAM:  ?BP (!) 163/90 (BP Location: Left Arm, Patient Position: Sitting)   Pulse 61   Temp (!) 97.2 ?F (36.2 ?C) (Tympanic)   Resp 18   Ht '5\' 3"'$  (1.6 m)   Wt 131 lb 11.2 oz (59.7 kg)   BMI 23.33 kg/m?  ?Lungs are clear to A&P cardiac examination essentially unremarkable with regular rate and rhythm. No dominant mass or nodularity is noted in either breast in 2 positions examined. Incision is well-healed. No axillary or supraclavicular adenopathy is appreciated. Cosmetic result is excellent.  Patient has had some skin lesions frozen over the past week.  Well-developed well-nourished patient in NAD. HEENT reveals PERLA, EOMI, discs not visualized.  Oral cavity is clear. No oral mucosal lesions are identified. Neck is clear without evidence of cervical or supraclavicular adenopathy. Lungs are clear to A&P. Cardiac examination is essentially unremarkable with regular rate and rhythm without murmur rub or thrill. Abdomen is benign with no organomegaly or masses noted. Motor sensory and DTR levels are  equal and symmetric in the upper and lower extremities. Cranial nerves II through XII are grossly intact. Proprioception is intact. No peripheral adenopathy or edema is identified. No motor or sensory levels are noted. Crude visual fields are within normal range. ? ?RADIOLOGY RESULTS: Mammograms reviewed compatible with above-stated findings ? ?PLAN: Present time patient is doing well with no evidence of disease now 3 years out.  Based on her age mobility and other comorbidity issues I am turning follow-up care over to medical oncology.  I would be happy to reevaluate the patient anytime should further treatment be indicated. ? ?I would like to take this opportunity to thank you for allowing me to participate in the care of your patient.. ?  ? Noreene Filbert, MD ? ?

## 2022-03-23 NOTE — Telephone Encounter (Signed)
Continue Coumadin 5 mg daily. Repeat PT/INR in 1 month ?

## 2022-04-02 ENCOUNTER — Other Ambulatory Visit: Payer: Self-pay

## 2022-04-02 MED ORDER — LISINOPRIL 30 MG PO TABS: 30.0000 mg | ORAL_TABLET | Freq: Every day | ORAL | 1 refills | Status: DC

## 2022-04-05 ENCOUNTER — Encounter: Payer: Self-pay | Admitting: Oncology

## 2022-04-05 ENCOUNTER — Encounter: Payer: Self-pay | Admitting: Podiatry

## 2022-04-05 ENCOUNTER — Other Ambulatory Visit: Payer: Self-pay | Admitting: Internal Medicine

## 2022-04-05 ENCOUNTER — Ambulatory Visit (INDEPENDENT_AMBULATORY_CARE_PROVIDER_SITE_OTHER): Payer: Medicare Other | Admitting: Podiatry

## 2022-04-05 DIAGNOSIS — D689 Coagulation defect, unspecified: Secondary | ICD-10-CM

## 2022-04-05 DIAGNOSIS — B351 Tinea unguium: Secondary | ICD-10-CM | POA: Diagnosis not present

## 2022-04-05 DIAGNOSIS — M79609 Pain in unspecified limb: Secondary | ICD-10-CM | POA: Diagnosis not present

## 2022-04-05 NOTE — Progress Notes (Signed)
This patient returns to my office for at risk foot care.  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  This patient is unable to cut nails herself since the patient cannot reach her nails.These nails are painful walking and wearing shoes.  This patient presents for at risk foot care today.  Patient was given ketoconazole by dermatologist and she see great improvement in her feet.  General Appearance  Alert, conversant and in no acute stress.  Vascular  Dorsalis pedis and posterior tibial  pulses are palpable  bilaterally.  Capillary return is within normal limits  bilaterally. Temperature is within normal limits  bilaterally.  Neurologic  Senn-Weinstein monofilament wire test within normal limits  bilaterally. Muscle power within normal limits bilaterally.  Nails Thick disfigured discolored nails with subungual debris  from hallux to fifth toes bilaterally. No evidence of bacterial infection or drainage bilaterally.  Orthopedic  No limitations of motion  feet .  No crepitus or effusions noted.  No bony pathology or digital deformities noted.  Skin  normotropic skin with no porokeratosis noted bilaterally.  No signs of infections or ulcers noted.     Onychomycosis  Pain in right toes  Pain in left toes  Consent was obtained for treatment procedures.   Mechanical debridement of nails 1-5  bilaterally performed with a nail nipper.  Filed with dremel without incident.    Return office visit    3 months                  Told patient to return for periodic foot care and evaluation due to potential at risk complications.   Jabori Henegar DPM  

## 2022-04-19 ENCOUNTER — Encounter: Payer: Self-pay | Admitting: Internal Medicine

## 2022-04-19 DIAGNOSIS — Z86718 Personal history of other venous thrombosis and embolism: Secondary | ICD-10-CM | POA: Diagnosis not present

## 2022-04-19 DIAGNOSIS — Z86711 Personal history of pulmonary embolism: Secondary | ICD-10-CM | POA: Diagnosis not present

## 2022-04-19 DIAGNOSIS — Z7901 Long term (current) use of anticoagulants: Secondary | ICD-10-CM | POA: Diagnosis not present

## 2022-04-19 DIAGNOSIS — D802 Selective deficiency of immunoglobulin A [IgA]: Secondary | ICD-10-CM | POA: Diagnosis not present

## 2022-04-19 LAB — PROTIME-INR: INR: 1.8 — AB (ref 0.80–1.20)

## 2022-04-26 ENCOUNTER — Other Ambulatory Visit: Payer: Self-pay | Admitting: Family

## 2022-04-26 NOTE — Telephone Encounter (Signed)
Rx refill sent to pharmacy. 

## 2022-04-27 ENCOUNTER — Encounter: Payer: Self-pay | Admitting: Oncology

## 2022-04-27 NOTE — Telephone Encounter (Signed)
Signing encounter, see note 01/05/21

## 2022-05-03 ENCOUNTER — Encounter: Payer: Self-pay | Admitting: Oncology

## 2022-05-03 DIAGNOSIS — Z7901 Long term (current) use of anticoagulants: Secondary | ICD-10-CM | POA: Diagnosis not present

## 2022-05-03 DIAGNOSIS — Z86711 Personal history of pulmonary embolism: Secondary | ICD-10-CM | POA: Diagnosis not present

## 2022-05-03 DIAGNOSIS — Z86718 Personal history of other venous thrombosis and embolism: Secondary | ICD-10-CM | POA: Diagnosis not present

## 2022-05-03 DIAGNOSIS — D802 Selective deficiency of immunoglobulin A [IgA]: Secondary | ICD-10-CM | POA: Diagnosis not present

## 2022-05-03 LAB — POCT INR: INR: 2.2 — AB (ref <=1.20)

## 2022-05-03 NOTE — Telephone Encounter (Signed)
Signing encounter, see note on 01/04/21 

## 2022-05-07 ENCOUNTER — Telehealth: Payer: Self-pay

## 2022-05-07 NOTE — Telephone Encounter (Signed)
Copied from [facility]. Topic: General - Other >> May 07, 2022  3:20 PM Becky Farrell wrote: Reason for CRM: The patient has called to request that orders for their lab work on 05/17/22 at 7:30 AM be cancelled  (the appointment was not scheduled at [facility])  The patient would like to speak with Farrell member of staff about their PT and INR levels when they're available   The patient shares that they would like to speak with Farrell member of staff when possible

## 2022-05-07 NOTE — Telephone Encounter (Signed)
Left message for Jancey RN from [facility] to call back about pt's PT/INR.   Per [name]:   No dose change for warfarin Repeat INR in 1 month.   I also tried calling pt but she did not answer.    PEC please advise when they call back.   Thanks,   -Mickel Baas

## 2022-05-07 NOTE — Telephone Encounter (Signed)
Advised Ms. Puerta of lab results.  I'm still waiting for [facility] to call back.   PEC please advise when they call.   Thanks,   -Mickel Baas

## 2022-05-23 ENCOUNTER — Other Ambulatory Visit: Payer: Self-pay | Admitting: Internal Medicine

## 2022-05-23 DIAGNOSIS — Z1231 Encounter for screening mammogram for malignant neoplasm of breast: Secondary | ICD-10-CM

## 2022-05-29 ENCOUNTER — Ambulatory Visit: Payer: Self-pay

## 2022-05-29 NOTE — Telephone Encounter (Signed)
Pt was treated for Gout in January /feb / pt is having a flair up in her arm and is asking a nurse to call her so she can know what to do next/ she doesn't want to take any medication / please advise    Chief Complaint: Left arm pain Symptoms: Above Frequency: Few days ago Pertinent Negatives: Patient denies swelling Disposition: [] ED /[] Urgent Care (no appt availability in office) / [x] Appointment(In office/virtual)/ []  [facility]/ [] Home Care/ [] Refused Recommended Disposition /[]  Mobile Bus/ []  Follow-up with PCP Additional Notes: Instructed to call back if symptoms worsen.  Reason for Disposition  [1] MODERATE pain (e.g., interferes with normal activities) AND [2] present > 3 days  Answer Assessment - Initial Assessment Questions 1. ONSET: "When did the pain start?"     A few days ago 2. LOCATION: "Where is the pain located?"     Left arm at elbow 3. PAIN: "How bad is the pain?" (Scale 1-10; or mild, moderate, severe)   - MILD (1-3): doesn't interfere with normal activities   - MODERATE (4-7): interferes with normal activities (e.g., work or school) or awakens from sleep   - SEVERE (8-10): excruciating pain, unable to do any normal activities, unable to hold a cup of water     Severe 4. WORK OR EXERCISE: "Has there been any recent work or exercise that involved this part of the body?"     No 5. CAUSE: "What do you think is causing the arm pain?"     Gout 6. OTHER SYMPTOMS: "Do you have any other symptoms?" (e.g., neck pain, swelling, rash, fever, numbness, weakness)     Weakness to arm 7. PREGNANCY: "Is there any chance you are pregnant?" "When was your last menstrual period?"     No  Protocols used: Arm Pain-A-AH

## 2022-05-31 ENCOUNTER — Ambulatory Visit: Payer: Medicare Other | Admitting: Internal Medicine

## 2022-06-04 DIAGNOSIS — D802 Selective deficiency of immunoglobulin A [IgA]: Secondary | ICD-10-CM | POA: Diagnosis not present

## 2022-06-04 LAB — PROTIME-INR: INR: 2.5 — AB (ref 0.80–1.20)

## 2022-06-13 ENCOUNTER — Ambulatory Visit (INDEPENDENT_AMBULATORY_CARE_PROVIDER_SITE_OTHER): Payer: Medicare Other | Admitting: Medical

## 2022-06-13 ENCOUNTER — Encounter: Payer: Self-pay | Admitting: Medical

## 2022-06-13 VITALS — BP 142/90 | HR 72 | Ht 62.0 in | Wt 128.0 lb

## 2022-06-13 DIAGNOSIS — M79604 Pain in right leg: Secondary | ICD-10-CM | POA: Diagnosis not present

## 2022-06-13 DIAGNOSIS — R2681 Unsteadiness on feet: Secondary | ICD-10-CM | POA: Diagnosis not present

## 2022-06-13 DIAGNOSIS — I350 Nonrheumatic aortic (valve) stenosis: Secondary | ICD-10-CM | POA: Diagnosis not present

## 2022-06-13 DIAGNOSIS — M79605 Pain in left leg: Secondary | ICD-10-CM

## 2022-06-13 DIAGNOSIS — I471 Supraventricular tachycardia: Secondary | ICD-10-CM

## 2022-06-13 DIAGNOSIS — I493 Ventricular premature depolarization: Secondary | ICD-10-CM

## 2022-06-13 DIAGNOSIS — I10 Essential (primary) hypertension: Secondary | ICD-10-CM

## 2022-06-13 NOTE — Patient Instructions (Signed)
Medication Instructions:  Your physician recommends that you continue on your current medications as directed. Please refer to the Current Medication list given to you today.  *If you need a refill on your cardiac medications before your next appointment, please call your pharmacy*   Lab Work: None ordered If you have labs (blood work) drawn today and your tests are completely normal, you will receive your results only by: MyChart Message (if you have MyChart) OR A paper copy in the mail If you have any lab test that is abnormal or we need to change your treatment, we will call you to review the results.   Testing/Procedures: None ordered   Follow-Up: At [facility], you and your health needs are our priority.  As part of our continuing mission to provide you with exceptional heart care, we have created designated Provider Care Teams.  These Care Teams include your primary Cardiologist (physician) and Advanced Practice Providers (APPs -  Physician Assistants and Nurse Practitioners) who all work together to provide you with the care you need, when you need it.  We recommend signing up for the patient portal called "MyChart".  Sign up information is provided on this After Visit Summary.  MyChart is used to connect with patients for Virtual Visits (Telemedicine).  Patients are able to view lab/test results, encounter notes, upcoming appointments, etc.  Non-urgent messages can be sent to your provider as well.   To learn more about what you can do with MyChart, go to [email or web address]://[email or web address].com.    Your next appointment:   Your physician wants you to follow-up in: 1 year You will receive a reminder letter in the mail two months in advance. If you don't receive a letter, please call our office to schedule the follow-up appointment.   The format for your next appointment:   In Person  Provider:   You may see Muhammad Arida, MD or one of the following Advanced Practice Providers on your  designated Care Team:   Christopher Berge, NP Ryan Dunn, PA-C Cadence Furth, PA-C   Other Instructions N/A  Important Information About Sugar       

## 2022-06-13 NOTE — Progress Notes (Signed)
Cardiology Office Note:    Date:  06/13/2022   ID:  Becky Farrell, DOB 1934-05-31, MRN [id number]  PCP:  Becky Fenton, NP  [facility] Cardiologist:  Becky Sacramento, MD  [facility] Electrophysiologist:  None   Referring MD: Becky Fenton, NP   Chief Complaint: 6 month follow-up  History of Present Illness:    Becky Farrell is a 88 y.o. female with a hx of  PVCs, SVT, palpitations, dizziness, PE on warfarin, osteoporosis, hypertension, right breast cancer status postlumpectomy and radiation, mild aortic stenosis presents for follow-up.  Her and her husband reside at [place].   Seen by Dr. Cindee Farrell 02/01/2021 reported mild exertional dyspnea and PVCs.  Murmur was noted on exam.  ZIO and echo were recommended.  Her ZIO showed dominantly normal sinus rhythm with occasional PACs with a 2.7 burden, PVCs with a 2.7 burden, 35 runs of SVT fastest 5 beats with 187 bpm and longest 16.5 seconds with a rate of 110 bpm.  Echo showed LVEF 50 to 55%, no wall motion abnormality, mildly elevated PASP, bilateral atrial mildly dilated, mild MR, mild AS.   Seen 03/10/2021 and overall feeling well with occasional palpitations.  Atenolol was transitioned to metoprolol   Seen 06/09/21 and reported claudication symptoms. B/l ABIS showed no significant disease bilaterally.   Last seen 09/2021 and she reported dizziness. Orthostatics were negative. No changes were made.   Today, the patient still has some balance issues. By the end of the day, head is very uneasy. She has apt with Dr. Richardson [facility] for inner ear issues. May have Vertigo. She hasn't fallen. She doesn't feel much dizziness or lightheadedness, just feels off balance. No chest pain, SOB, LLE, orthopnea, pnd or palpitations.   Past Medical History:  Diagnosis Date   Colon polyps    Family history of breast cancer    History of pulmonary embolism 2011   Hx of melanoma of skin 2003   Right leg   Hypertension    Melanoma ([place]) 2003   right  lower leg. [place]ated in [place].   Personal history of radiation therapy    Seizures ([place]) 1993   no meds since (approx) 2014   Vertigo    x1. R/T perforated ear drum prior to 2013    Past Surgical History:  Procedure Laterality Date   ABDOMINAL HYSTERECTOMY  05/2014   BREAST BIOPSY Right 08/20/2018   invasive mammary carcinoma/DCIS    BREAST LUMPECTOMY Right 10/10/2018   BREAST LUMPECTOMY WITH RADIOACTIVE SEED LOCALIZATION Right 10/10/2018   Procedure: BREAST LUMPECTOMY WITH RADIOACTIVE SEED LOCALIZATION;  Surgeon: Becky Kussmaul, MD;  Location: [place];  Service: General;  Laterality: Right;   CATARACT EXTRACTION W/PHACO Right 01/06/2018   Procedure: CATARACT EXTRACTION PHACO AND INTRAOCULAR LENS PLACEMENT ([place]) RIGHT;  Surgeon: Becky Koyanagi, MD;  Location: [place];  Service: Ophthalmology;  Laterality: Right;   CATARACT EXTRACTION W/PHACO Left 01/29/2018   Procedure: CATARACT EXTRACTION PHACO AND INTRAOCULAR LENS PLACEMENT ([place]) LEFT;  Surgeon: Becky Koyanagi, MD;  Location: [place];  Service: Ophthalmology;  Laterality: Left;   left hand surgery  05/2011   s/p fall   OTHER SURGICAL HISTORY  2003   Removal of lymphoma from melonoma 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     Current Medications: Current Meds  Medication Sig   aspirin 81 MG tablet Take 81 mg by mouth daily.   Cholecalciferol (VITAMIN D3) 2000 UNITS TABS Take 2,000 Units by mouth daily.    denosumab (  PROLIA) 60 MG/ML SOSY injection Inject 60 mg into the skin every 6 (six) months.   ketoconazole (NIZORAL) 2 % cream Apply to rash under breasts 2 times a day until rash improved.   letrozole (FEMARA) 2.5 MG tablet TAKE 1 TABLET BY MOUTH EVERY DAY   lisinopril (ZESTRIL) 30 MG tablet Take 1 tablet (30 mg total) by mouth daily.   metoprolol tartrate (LOPRESSOR) 25 MG tablet TAKE 1 TABLET BY MOUTH TWICE A DAY   metroNIDAZOLE (METROCREAM) 0.75 % cream Apply to nose every night for rosacea.   polyethylene glycol (MIRALAX  / GLYCOLAX) packet Take 17 g by mouth every other day.   vitamin B-12 (CYANOCOBALAMIN) 1000 MCG tablet Take 1,000 mcg by mouth daily.   warfarin (COUMADIN) 5 MG tablet TAKE ONE TABLET BY MOUTH DAILY EXCEPT TAKE 7.'5MG'$  ON MONDAYS AND THURSDAYS OR AS DIRECTED BY COUMADIN CLINIC   Wheat Dextrin (BENEFIBER PO) Take 2 each by mouth every other day.     Allergies:   Clobetasol, Clobetasol propionate, Phenergan [promethazine hcl], Dextromethorphan, and Zithromax [azithromycin]   Social History   Socioeconomic History   Marital status: Married    Spouse name: Not on file   Number of children: Not on file   Years of education: Not on file   Highest education level: Not on file  Occupational History   Not on file  Tobacco Use   Smoking status: Never   Smokeless tobacco: Never  Vaping Use   Vaping Use: Never used  Substance and Sexual Activity   Alcohol use: No   Drug use: Never   Sexual activity: Never  Other Topics Concern   Not on file  Social History Narrative   Recently moved from [place] to [place].   Daughter and grandchildren live in [place].   Has other children in [place].   Retired Pharmacist, [place].      Husband has been recovering from Prostate CA.   Social Determinants of Health   Financial Resource Strain: Not on file  Food Insecurity: Not on file  Transportation Needs: Not on file  Physical Activity: Not on file  Stress: Not on file  Social Connections: Not on file     Family History: The patient's family history includes Aortic aneurysm in her father; Breast cancer (age of onset: 63) in her niece; Breast cancer (age of onset: 9) in her daughter; Heart disease in her mother.  ROS:   Please see the history of present illness.     All other systems reviewed and are negative.  EKGs/Labs/Other Studies Reviewed:    The following studies were reviewed today:   Echo 01/2021  1. Left ventricular ejection fraction, by estimation, is 50 to 55%. The  left ventricle has  low normal function. The left ventricle has no regional  wall motion abnormalities. Left ventricular diastolic parameters are  indeterminate.   2. Right ventricular systolic function is low normal. The right  ventricular size is normal. There is mildly elevated pulmonary artery  systolic pressure.   3. Left atrial size was mildly dilated.   4. Right atrial size was mildly dilated.   5. The mitral valve is degenerative. Mild mitral valve regurgitation.   6. The aortic valve is tricuspid. Aortic valve regurgitation is mild.  Mild aortic valve sclerosis is present, with no evidence of aortic valve  stenosis.   7. The inferior vena cava is normal in size with greater than 50%  respiratory variability, suggesting right atrial pressure of 3 mmHg.  Heart monitor 01/2021 Patient had a min HR of 44 bpm, max HR of 187 bpm, and avg HR of 69 bpm.  Predominant underlying rhythm was Sinus Rhythm.  35 Supraventricular Tachycardia runs occurred, the run with the fastest interval lasting 5 beats with a max rate of 187 bpm, the longest lasting 16.5 secs with an avg rate of 110 bpm. Supraventricular Tachycardia was detected within +/- 45 seconds of symptomatic patient event(s). Occasional PACs with a burden of 2.7% and occasional PVCs with a burden of 2.1%.     ABIs 07/2021 Summary:  Right: Resting right ankle-brachial index is within normal range. No  evidence of significant right lower extremity arterial disease. The right  toe-brachial index is normal.   Left: Resting left ankle-brachial index is within normal range. No  evidence of significant left lower extremity arterial disease. The left  toe-brachial index is normal.   EKG:  EKG is  ordered today.  The ekg ordered today demonstrates NSR 72bpm, sinus arrhythmia, no changes  Recent Labs: 10/09/2021: TSH 1.11 01/22/2022: ALT 15; BUN 18; Creatinine, Ser 0.65; Hemoglobin 13.1; Platelets 186; Potassium 3.6; Sodium 136  Recent Lipid Panel     Component Value Date/Time   CHOL 215 (H) 10/09/2021 1406   TRIG 82 10/09/2021 1406   HDL 78 10/09/2021 1406   CHOLHDL 2.8 10/09/2021 1406   VLDL 16.0 04/26/2020 1503   LDLCALC 119 (H) 10/09/2021 1406   LDLDIRECT 158.5 11/04/2012 1105    Physical Exam:    VS:  BP (!) 142/90 (BP Location: Left Arm, Patient Position: Sitting, Cuff Size: Normal)   Pulse 72   Ht '5\' 2"'$  (1.575 m)   Wt 128 lb (58.1 kg)   SpO2 97%   BMI 23.41 kg/m     Wt Readings from Last 3 Encounters:  06/13/22 128 lb (58.1 kg)  03/22/22 131 lb 11.2 oz (59.7 kg)  01/22/22 130 lb 6.4 oz (59.1 kg)     GEN:  Well nourished, well developed in no acute distress HEENT: Normal NECK: No JVD; No carotid bruits LYMPHATICS: No lymphadenopathy CARDIAC: RRR, + murmur, no rubs, gallops RESPIRATORY:  Clear to auscultation without rales, wheezing or rhonchi  ABDOMEN: Soft, non-tender, non-distended MUSCULOSKELETAL:  No edema; No deformity  SKIN: Warm and dry NEUROLOGIC:  Alert and oriented x 3 PSYCHIATRIC:  Normal affect   ASSESSMENT:    1. Gait instability   2. HTN, goal below 140/90   3. PVC (premature ventricular contraction)   4. SVT (supraventricular tachycardia) ([place])   5. Mild aortic stenosis   6. Essential hypertension   7. Pain in both lower extremities    PLAN:    In order of problems listed above:  Vertigo/Gait instability She reports persistent gait instability. She did PT in January, which helped her balance. She may have vertigo, so she has an appointment with Dr. Richardson [facility] later this month and will have further inner ear testing. She is very careful when she walks as to not fall. BP is reasonable today. Continue Lopressor and Lisinopril.   PVCs/PACs/pSVT Seen on a heart monitor in 03/2021. She denies palpitations.   Mild AS Echo in 2022 showed LVEF 50-55%, with mild AS. No signs of decompensation. Can follow with serial echocardiograms.  Chronic anticoagulation Continue warfarin for h/o PE.    HTN BP is good today. Continue current medications.   Leg pain This is improved. Prior ABIs with no significant disease.   Disposition: Follow up in 1 year(s) with MD/APP  Signed, Maikol Grassia Ninfa Meeker, PA-C  06/13/2022 4:00 PM    [facility]

## 2022-06-14 DIAGNOSIS — R42 Dizziness and giddiness: Secondary | ICD-10-CM | POA: Diagnosis not present

## 2022-06-14 DIAGNOSIS — H7202 Central perforation of tympanic membrane, left ear: Secondary | ICD-10-CM | POA: Diagnosis not present

## 2022-06-14 DIAGNOSIS — H6123 Impacted cerumen, bilateral: Secondary | ICD-10-CM | POA: Diagnosis not present

## 2022-06-15 DIAGNOSIS — H0100A Unspecified blepharitis right eye, upper and lower eyelids: Secondary | ICD-10-CM | POA: Diagnosis not present

## 2022-06-15 DIAGNOSIS — H0015 Chalazion left lower eyelid: Secondary | ICD-10-CM | POA: Diagnosis not present

## 2022-06-15 DIAGNOSIS — H0100B Unspecified blepharitis left eye, upper and lower eyelids: Secondary | ICD-10-CM | POA: Diagnosis not present

## 2022-06-15 DIAGNOSIS — H0012 Chalazion right lower eyelid: Secondary | ICD-10-CM | POA: Diagnosis not present

## 2022-06-19 ENCOUNTER — Ambulatory Visit: Payer: Medicare Other | Admitting: Internal Medicine

## 2022-06-19 NOTE — Progress Notes (Deleted)
Subjective:    Patient ID: Becky [facility], female    DOB: [birth year], 88 y.o.   MRN: [id number]  HPI  Patient presents to clinic today with complaint of left arm pain.  This started.  She describes the pain as.  Review of Systems  Past Medical History:  Diagnosis Date   Colon polyps    Family history of breast cancer    History of pulmonary embolism 2011   Hx of melanoma of skin 2003   Right leg   Hypertension    Melanoma ([place]) 2003   right lower leg. [place] in [place].   Personal history of radiation therapy    Seizures ([place]) 1993   no meds since (approx) 2014   Vertigo    x1. R/T perforated ear drum prior to 2013    Current Outpatient Medications  Medication Sig Dispense Refill   aspirin 81 MG tablet Take 81 mg by mouth daily.     Cholecalciferol (VITAMIN D3) 2000 UNITS TABS Take 2,000 Units by mouth daily.      denosumab (PROLIA) 60 MG/ML SOSY injection Inject 60 mg into the skin every 6 (six) months.     ketoconazole (NIZORAL) 2 % cream Apply to rash under breasts 2 times a day until rash improved. 60 g 2   letrozole (FEMARA) 2.5 MG tablet TAKE 1 TABLET BY MOUTH EVERY DAY 90 tablet 3   lisinopril (ZESTRIL) 30 MG tablet Take 1 tablet (30 mg total) by mouth daily. 90 tablet 1   metoprolol tartrate (LOPRESSOR) 25 MG tablet TAKE 1 TABLET BY MOUTH TWICE A DAY 180 tablet 1   metroNIDAZOLE (METROCREAM) 0.75 % cream Apply to nose every night for rosacea. 45 g 1   polyethylene glycol (MIRALAX / GLYCOLAX) packet Take 17 g by mouth every other day.     vitamin B-12 (CYANOCOBALAMIN) 1000 MCG tablet Take 1,000 mcg by mouth daily.     warfarin (COUMADIN) 5 MG tablet TAKE ONE TABLET BY MOUTH DAILY EXCEPT TAKE 7.'5MG'$  ON MONDAYS AND THURSDAYS OR AS DIRECTED BY COUMADIN CLINIC 105 tablet 1   Wheat Dextrin (BENEFIBER PO) Take 2 each by mouth every other day.     No current facility-administered medications for this visit.    Allergies  Allergen Reactions   Clobetasol Other (See  Comments)    vertigo   Clobetasol Propionate Other (See Comments)    This is ointment form UNSPECIFIED REACTION    Phenergan [Promethazine Hcl] Other (See Comments)    Rapid heart rate   Dextromethorphan Palpitations and Other (See Comments)    promethazine with DM, rapid hearbeat   Zithromax [Azithromycin] Palpitations and Other (See Comments)    Per OSH report, pt unable to recall Rapid heart rate    Family History  Problem Relation Age of Onset   Heart disease Mother    Aortic aneurysm Father    Breast cancer Daughter 40   Breast cancer Niece 51    Social History   Socioeconomic History   Marital status: Married    Spouse name: Not on file   Number of children: Not on file   Years of education: Not on file   Highest education level: Not on file  Occupational History   Not on file  Tobacco Use   Smoking status: Never   Smokeless tobacco: Never  Vaping Use   Vaping Use: Never used  Substance and Sexual Activity   Alcohol use: No   Drug use: Never   Sexual activity:  Never  Other Topics Concern   Not on file  Social History Narrative   Recently moved from [place] to [facility].   Daughter and grandchildren live in [place].   Has other children in [place].   Retired Pharmacist, hospital.      Husband has been recovering from Prostate CA.   Social Determinants of Health   Financial Resource Strain: Not on file  Food Insecurity: Not on file  Transportation Needs: Not on file  Physical Activity: Not on file  Stress: Not on file  Social Connections: Not on file  Intimate Partner Violence: Not on file     Constitutional: Denies fever, malaise, fatigue, headache or abrupt weight changes.  HEENT: Denies eye pain, eye redness, ear pain, ringing in the ears, wax buildup, runny nose, nasal congestion, bloody nose, or sore throat. Respiratory: Denies difficulty breathing, shortness of breath, cough or sputum production.   Cardiovascular: Denies chest pain, chest tightness,  palpitations or swelling in the hands or feet.  Gastrointestinal: Denies abdominal pain, bloating, constipation, diarrhea or blood in the stool.  GU: Denies urgency, frequency, pain with urination, burning sensation, blood in urine, odor or discharge. Musculoskeletal: Patient reports left arm pain.  Denies decrease in range of motion, difficulty with gait, or joint swelling.  Skin: Denies redness, rashes, lesions or ulcercations.  Neurological: Denies dizziness, difficulty with memory, difficulty with speech or problems with balance and coordination.  Psych: Denies anxiety, depression, SI/HI.  No other specific complaints in a complete review of systems (except as listed in HPI above).     Objective:   Physical Exam   There were no vitals taken for this visit. Wt Readings from Last 3 Encounters:  06/13/22 128 lb (58.1 kg)  03/22/22 131 lb 11.2 oz (59.7 kg)  01/22/22 130 lb 6.4 oz (59.1 kg)    General: Appears their stated age, well developed, well nourished in NAD. Skin: Warm, dry and intact. No rashes, lesions or ulcerations noted. HEENT: Head: normal shape and size; Eyes: sclera white, no icterus, conjunctiva pink, PERRLA and EOMs intact; Ears: Tm's gray and intact, normal light reflex; Nose: mucosa pink and moist, septum midline; Throat/Mouth: Teeth present, mucosa pink and moist, no exudate, lesions or ulcerations noted.  Neck:  Neck supple, trachea midline. No masses, lumps or thyromegaly present.  Cardiovascular: Normal rate and rhythm. S1,S2 noted.  No murmur, rubs or gallops noted. No JVD or BLE edema. No carotid bruits noted. Pulmonary/Chest: Normal effort and positive vesicular breath sounds. No respiratory distress. No wheezes, rales or ronchi noted.  Abdomen: Soft and nontender. Normal bowel sounds. No distention or masses noted. Liver, spleen and kidneys non palpable. Musculoskeletal: Normal range of motion. No signs of joint swelling. No difficulty with gait.   Neurological: Alert and oriented. Cranial nerves II-XII grossly intact. Coordination normal.  Psychiatric: Mood and affect normal. Behavior is normal. Judgment and thought content normal.    BMET    Component Value Date/Time   NA 136 01/22/2022 1414   NA 140 04/22/2013 0237   K 3.6 01/22/2022 1414   K 3.6 04/22/2013 0237   CL 103 01/22/2022 1414   CL 106 04/22/2013 0237   CO2 27 01/22/2022 1414   CO2 29 04/22/2013 0237   GLUCOSE 99 01/22/2022 1414   GLUCOSE 88 04/22/2013 0237   BUN 18 01/22/2022 1414   BUN 26 (H) 04/22/2013 0237   CREATININE 0.65 01/22/2022 1414   CREATININE 0.74 01/01/2014 1556   CALCIUM 9.5 01/22/2022 1414   CALCIUM  9.7 04/22/2013 0237   GFRNONAA >60 01/22/2022 1414   GFRNONAA >60 04/22/2013 0237   GFRAA >60 08/10/2020 1025   GFRAA >60 04/22/2013 0237    Lipid Panel     Component Value Date/Time   CHOL 215 (H) 10/09/2021 1406   TRIG 82 10/09/2021 1406   HDL 78 10/09/2021 1406   CHOLHDL 2.8 10/09/2021 1406   VLDL 16.0 04/26/2020 1503   LDLCALC 119 (H) 10/09/2021 1406    CBC    Component Value Date/Time   WBC 5.6 01/22/2022 1414   RBC 4.13 01/22/2022 1414   HGB 13.1 01/22/2022 1414   HGB 13.8 05/05/2014 1218   HCT 40.1 01/22/2022 1414   HCT 36.4 05/12/2014 0628   PLT 186 01/22/2022 1414   PLT 163 05/05/2014 1218   MCV 97.1 01/22/2022 1414   MCV 97 05/05/2014 1218   MCH 31.7 01/22/2022 1414   MCHC 32.7 01/22/2022 1414   RDW 13.4 01/22/2022 1414   RDW 13.6 05/05/2014 1218   LYMPHSABS 0.9 01/22/2022 1414   MONOABS 0.5 01/22/2022 1414   EOSABS 0.2 01/22/2022 1414   BASOSABS 0.0 01/22/2022 1414    Hgb A1C Lab Results  Component Value Date   HGBA1C 5.3 10/09/2021           Assessment & Plan:     RTC in 1 month for follow-up of chronic conditions [name], NP

## 2022-06-21 ENCOUNTER — Telehealth: Payer: Self-pay | Admitting: Internal Medicine

## 2022-06-21 NOTE — Telephone Encounter (Signed)
Left message for patient to call back and schedule the Medicare Annual Wellness Visit (AWV) virtually or by telephone.  Last AWV 05/08/21  Please schedule at anytime with [facility].   Any questions, please call me at [phone]

## 2022-06-29 ENCOUNTER — Ambulatory Visit: Payer: Medicare Other

## 2022-07-01 ENCOUNTER — Emergency Department
Admission: EM | Admit: 2022-07-01 | Discharge: 2022-07-02 | Disposition: A | Discharge status: Home / Self Care | Payer: Medicare Other | Attending: Emergency Medicine | Admitting: Emergency Medicine

## 2022-07-01 ENCOUNTER — Emergency Department: Payer: Medicare Other

## 2022-07-01 ENCOUNTER — Other Ambulatory Visit: Payer: Self-pay

## 2022-07-01 DIAGNOSIS — R29810 Facial weakness: Secondary | ICD-10-CM | POA: Insufficient documentation

## 2022-07-01 DIAGNOSIS — R269 Unspecified abnormalities of gait and mobility: Secondary | ICD-10-CM | POA: Insufficient documentation

## 2022-07-01 DIAGNOSIS — I771 Stricture of artery: Secondary | ICD-10-CM | POA: Diagnosis not present

## 2022-07-01 DIAGNOSIS — I616 Nontraumatic intracerebral hemorrhage, multiple localized: Secondary | ICD-10-CM | POA: Diagnosis not present

## 2022-07-01 DIAGNOSIS — M47812 Spondylosis without myelopathy or radiculopathy, cervical region: Secondary | ICD-10-CM | POA: Diagnosis not present

## 2022-07-01 DIAGNOSIS — R42 Dizziness and giddiness: Secondary | ICD-10-CM

## 2022-07-01 DIAGNOSIS — R29818 Other symptoms and signs involving the nervous system: Secondary | ICD-10-CM | POA: Diagnosis not present

## 2022-07-01 DIAGNOSIS — I10 Essential (primary) hypertension: Secondary | ICD-10-CM | POA: Diagnosis not present

## 2022-07-01 DIAGNOSIS — Z85820 Personal history of malignant melanoma of skin: Secondary | ICD-10-CM | POA: Insufficient documentation

## 2022-07-01 DIAGNOSIS — I6523 Occlusion and stenosis of bilateral carotid arteries: Secondary | ICD-10-CM | POA: Diagnosis not present

## 2022-07-01 DIAGNOSIS — Z7901 Long term (current) use of anticoagulants: Secondary | ICD-10-CM | POA: Insufficient documentation

## 2022-07-01 DIAGNOSIS — I6782 Cerebral ischemia: Secondary | ICD-10-CM | POA: Diagnosis not present

## 2022-07-01 DIAGNOSIS — R2681 Unsteadiness on feet: Secondary | ICD-10-CM

## 2022-07-01 DIAGNOSIS — Z853 Personal history of malignant neoplasm of breast: Secondary | ICD-10-CM | POA: Diagnosis not present

## 2022-07-01 LAB — CBC
HCT: 41.3 % (ref 36.0–46.0)
Hemoglobin: 13.5 g/dL (ref 12.0–15.0)
MCH: 31.2 pg (ref 26.0–34.0)
MCHC: 32.7 g/dL (ref 30.0–36.0)
MCV: 95.4 fL (ref 80.0–100.0)
Platelets: 191 10*3/uL (ref 150–400)
RBC: 4.33 MIL/uL (ref 3.87–5.11)
RDW: 13.2 % (ref 11.5–15.5)
WBC: 5.6 10*3/uL (ref 4.0–10.5)
nRBC: 0 % (ref 0.0–0.2)

## 2022-07-01 LAB — COMPREHENSIVE METABOLIC PANEL
ALT: 15 U/L (ref 0–44)
AST: 23 U/L (ref 15–41)
Albumin: 4.1 g/dL (ref 3.5–5.0)
Alkaline Phosphatase: 67 U/L (ref 38–126)
Anion gap: 9 (ref 5–15)
BUN: 17 mg/dL (ref 8–23)
CO2: 24 mmol/L (ref 22–32)
Calcium: 9.4 mg/dL (ref 8.9–10.3)
Chloride: 105 mmol/L (ref 98–111)
Creatinine, Ser: 0.74 mg/dL (ref 0.44–1.00)
GFR, Estimated: >60 mL/min (ref >60)
Glucose, Bld: 115 mg/dL — ABNORMAL HIGH (ref 70–99)
Potassium: 3.6 mmol/L (ref 3.5–5.1)
Sodium: 138 mmol/L (ref 135–145)
Total Bilirubin: 0.8 mg/dL (ref 0.3–1.2)
Total Protein: 7.1 g/dL (ref 6.5–8.1)

## 2022-07-01 LAB — PROTIME-INR
INR: 2.5 — ABNORMAL HIGH (ref 0.8–1.2)
Prothrombin Time: 26.9 seconds — ABNORMAL HIGH (ref 11.4–15.2)

## 2022-07-01 LAB — TSH: TSH: 1.366 u[IU]/mL (ref 0.350–4.500)

## 2022-07-01 LAB — TROPONIN I (HIGH SENSITIVITY): Troponin I (High Sensitivity): 6 ng/L (ref <18)

## 2022-07-01 NOTE — ED Provider Notes (Signed)
[facility] Provider Note    Event Date/Time   First MD Initiated Contact with Patient 07/01/22 2130     (approximate)   History   Dizziness   HPI  Becky [facility] is a 88 y.o. female with a past medical history of a PE on warfarin, history of PVCs and SVT, HTN, osteoporosis, breast cancer status postlumpectomy and radiation, aortic stenosis and chronic balance issues followed by ENT for remote left-sided tympanic membrane rupture who presents for evaluation of high blood pressure and "swimmy headedness" she noticed sometime earlier this afternoon.  Is not exactly clear when the symptoms started.  He thinks maybe her dizziness was slightly worse than usual and she is having little more difficulty walking than usual although has been dealing with difficulty walking for several months and was referred by PCP for PT.  She denies any LOC, headache, visual changes, earache, sore throat, cough, chest pain, shortness of breath, back pain, abdominal pain, vomiting or diarrhea.  She states it feels like her lips are very dry. no recent tobacco abuse EtOH use illicit drugs.  She states her blood pressures typically of systolics in the 973Z.  She is compliant with her medications including lisinopril and metoprolol.    Past Medical History:  Diagnosis Date   Colon polyps    Family history of breast cancer    History of pulmonary embolism 2011   Hx of melanoma of skin 2003   Right leg   Hypertension    Melanoma ([place]) 2003   right lower leg. [place]ated in [place].   Personal history of radiation therapy    Seizures ([place]) 1993   no meds since (approx) 2014   Vertigo    x1. R/T perforated ear drum prior to 2013     Physical Exam  Triage Vital Signs: ED Triage Vitals  Enc Vitals Group     BP 07/01/22 2053 (!) 209/116     Pulse Rate 07/01/22 2053 100     Resp 07/01/22 2053 16     Temp 07/01/22 2053 97.7 F (36.5 C)     Temp Source 07/01/22 2053 Oral     SpO2 07/01/22  2053 97 %     Weight 07/01/22 2053 129 lb (58.5 kg)     Height 07/01/22 2053 '5\' 2"'$  (1.575 m)     Head Circumference --      Peak Flow --      Pain Score 07/01/22 2056 0     Pain Loc --      Pain Edu? --      Excl. in [place]? --     Most recent vital signs: Vitals:   07/01/22 2053  BP: (!) 209/116  Pulse: 100  Resp: 16  Temp: 97.7 F (36.5 C)  SpO2: 97%    General: Awake, no distress.  CV:  Good peripheral perfusion.  Resp:  Normal effort.  Abd:  No distention.  Other:  There appears to be a very subtle left-sided facial droop which on further questioning both patient and husband at bedside state is chronic.  Cranial nerves II through XII are otherwise grossly intact.  She is symmetric strength in upper and lower extremities.  No pronator drift or finger dysmetria.  Sensation is intact light touch all extremities.  Patient is very steady on trial of ambulation.  She is able to ambulate unassisted more than 100 steps.   ED Results / Procedures / Treatments  Labs (all labs ordered are listed,  but only abnormal results are displayed) Labs Reviewed  PROTIME-INR - Abnormal; Notable for the following components:      Result Value   Prothrombin Time 26.9 (*)    INR 2.5 (*)    All other components within normal limits  CBC  COMPREHENSIVE METABOLIC PANEL  URINALYSIS, COMPLETE (UACMP) WITH MICROSCOPIC  TSH  TROPONIN I (HIGH SENSITIVITY)     EKG  EKG is remarkable for sinus rhythm with ventricular rate 99, borderline left axis deviation, unremarkable intervals with some artifact in lead II with nonspecific change in lead III and without other clear evidence of acute ischemia or significant arrhythmia.   RADIOLOGY *** {You MUST document your own interpretation of imaging, as well as the fact that you reviewed the radiologist's report!:1}   PROCEDURES:  Critical Care performed: {CriticalCareYesNo:19197::"Yes, see critical care procedure note(s)","No"}  Procedures {If the  patient is on the monitor, remove the brackets and asterisks. Otherwise delete the sentence below:1} {**The patient is on the cardiac monitor to evaluate for evidence of arrhythmia and/or significant heart rate changes.**}   MEDICATIONS ORDERED IN ED: Medications - No data to display   IMPRESSION / MDM / [place] / ED COURSE  I reviewed the triage vital signs and the nursing notes. Patient's presentation is most consistent with acute presentation with potential threat to life or bodily function.                               Differential diagnosis includes, but is not limited to press, CVA, hypertensive emergency, metabolic derangements,  arrhythmia.  EKG is remarkable for sinus rhythm with ventricular rate 99, borderline left axis deviation, unremarkable intervals with some artifact in lead II with nonspecific change in lead III and without other clear evidence of acute ischemia or significant arrhythmia.      FINAL CLINICAL IMPRESSION(S) / ED DIAGNOSES   Final diagnoses:  None     Rx / DC Orders   ED Discharge Orders     None        Note:  This document was prepared using Dragon voice recognition software and may include unintentional dictation errors.

## 2022-07-01 NOTE — ED Triage Notes (Signed)
Pt states she is here for dizziness for several weeks, off balance for several weeks and dry mouth today. Pt states she called the "EMT" and they checked her blood pressure and it was high. Pt denies chest pain, is HOH.

## 2022-07-02 ENCOUNTER — Emergency Department: Payer: Medicare Other

## 2022-07-02 DIAGNOSIS — I6782 Cerebral ischemia: Secondary | ICD-10-CM | POA: Diagnosis not present

## 2022-07-02 DIAGNOSIS — R29818 Other symptoms and signs involving the nervous system: Secondary | ICD-10-CM | POA: Diagnosis not present

## 2022-07-02 DIAGNOSIS — I616 Nontraumatic intracerebral hemorrhage, multiple localized: Secondary | ICD-10-CM | POA: Diagnosis not present

## 2022-07-02 DIAGNOSIS — I771 Stricture of artery: Secondary | ICD-10-CM | POA: Diagnosis not present

## 2022-07-02 DIAGNOSIS — M47812 Spondylosis without myelopathy or radiculopathy, cervical region: Secondary | ICD-10-CM | POA: Diagnosis not present

## 2022-07-02 DIAGNOSIS — I10 Essential (primary) hypertension: Secondary | ICD-10-CM | POA: Diagnosis not present

## 2022-07-02 DIAGNOSIS — I6523 Occlusion and stenosis of bilateral carotid arteries: Secondary | ICD-10-CM | POA: Diagnosis not present

## 2022-07-02 LAB — URINALYSIS, COMPLETE (UACMP) WITH MICROSCOPIC
Bacteria, UA: NONE SEEN
Bilirubin Urine: NEGATIVE
Glucose, UA: NEGATIVE mg/dL
Hgb urine dipstick: NEGATIVE
Ketones, ur: NEGATIVE mg/dL
Leukocytes,Ua: NEGATIVE
Nitrite: NEGATIVE
Protein, ur: NEGATIVE mg/dL
Specific Gravity, Urine: 1.023 (ref 1.005–1.030)
Squamous Epithelial / HPF: NONE SEEN (ref 0–5)
pH: 8 (ref 5.0–8.0)

## 2022-07-02 MED ORDER — IOHEXOL 350 MG/ML SOLN
75.0000 mL | Freq: Once | INTRAVENOUS | Status: AC | PRN
  Administered 2022-07-02: 75 mL via INTRAVENOUS

## 2022-07-02 NOTE — ED Provider Notes (Signed)
Patient's MRI and CT angio without any acute intracranial process.  Patient's blood pressure did improve here in the emergency department.  Patient was able to ambulate with what she states is her baseline balance issue.  At this time I had a discussion with the patient.  She stated she did feel comfortable going home.  I think this is reasonable.  She does state that she can get her blood pressure and vital signs checked Monday Wednesday and Friday.  Additionally she does have a follow-up appointment already scheduled with her primary care provider in 4 days.   Nance Pear, MD 07/02/22 [phone]

## 2022-07-02 NOTE — ED Notes (Signed)
Patient transported to MRI 

## 2022-07-02 NOTE — Discharge Instructions (Signed)
Please seek medical attention for any high fevers, chest pain, shortness of breath, change in behavior, persistent vomiting, bloody stool or any other new or concerning symptoms.  

## 2022-07-02 NOTE — ED Notes (Signed)
Pt presents to ED with c/o dizziness. Pt states the dizziness has been going on for about a couple of weeks and has not been better. Pt denies chest pain, SOB, headache, nor blurry vision. Pt is A&Ox4 and speech is clear at this time.

## 2022-07-02 NOTE — ED Notes (Signed)
Pt ambulated around room without any assistance. Pt c/o dizziness upon getting up and feeling lightheaded while ambulated. Pt able to ambulate without assistance.

## 2022-07-05 ENCOUNTER — Encounter: Payer: Self-pay | Admitting: Internal Medicine

## 2022-07-05 ENCOUNTER — Telehealth: Payer: Self-pay

## 2022-07-05 DIAGNOSIS — Z7901 Long term (current) use of anticoagulants: Secondary | ICD-10-CM | POA: Diagnosis not present

## 2022-07-05 DIAGNOSIS — Z86718 Personal history of other venous thrombosis and embolism: Secondary | ICD-10-CM | POA: Diagnosis not present

## 2022-07-05 DIAGNOSIS — Z86711 Personal history of pulmonary embolism: Secondary | ICD-10-CM | POA: Diagnosis not present

## 2022-07-05 DIAGNOSIS — D802 Selective deficiency of immunoglobulin A [IgA]: Secondary | ICD-10-CM | POA: Diagnosis not present

## 2022-07-05 LAB — PROTIME-INR

## 2022-07-05 NOTE — Telephone Encounter (Signed)
Janci with [facility] calling with lab results from ED visit. INR - 3.3  pt - 31.0 .

## 2022-07-06 ENCOUNTER — Encounter: Payer: Self-pay | Admitting: Internal Medicine

## 2022-07-06 ENCOUNTER — Ambulatory Visit (INDEPENDENT_AMBULATORY_CARE_PROVIDER_SITE_OTHER): Payer: Medicare Other | Admitting: Internal Medicine

## 2022-07-06 VITALS — BP 138/84 | HR 63 | Temp 98.0°F | Wt 127.0 lb

## 2022-07-06 DIAGNOSIS — R42 Dizziness and giddiness: Secondary | ICD-10-CM | POA: Diagnosis not present

## 2022-07-06 DIAGNOSIS — R2689 Other abnormalities of gait and mobility: Secondary | ICD-10-CM | POA: Diagnosis not present

## 2022-07-06 DIAGNOSIS — I6529 Occlusion and stenosis of unspecified carotid artery: Secondary | ICD-10-CM | POA: Insufficient documentation

## 2022-07-06 NOTE — Patient Instructions (Signed)
How to Use a Cane  Canes are used to help with walking. Using a cane makes you more stable, reduces pain, and eases strain on certain muscle groups. There are various kinds of canes. Most have either a single point, four points (quad cane), or three points at the bottom. The best kind of cane for you depends on what you need it for. People with arthritis generally do well with a single-point cane. People who have certain neurological conditions, such as people who have had a stroke, may do better with a quad cane because it allows them to put more weight on it (support more of their body weight). How to choose a cane that fits It is important to use a cane that fits properly. A cane fits properly if the top of the cane comes to your wrist joint when you are standing upright with your arm relaxed at your side. How to use your cane Hold your cane in the hand opposite the injured or weaker side. Always move the cane and the foot of the weaker side with each other (in unison). Walking Put as much weight on the cane as necessary to make walking comfortable, stable, and smooth. Stand tall with good posture and look ahead, not down at your feet. Hold the cane about 2 inches (5 cm) in front or to the side of you. Each time you take a step with your injured leg, move the cane at the same time to help balance you. Going up steps Step first with your stronger foot. Move the cane and the weaker foot up the step at the same time. Always hold the railing with your free hand. Going down steps Step down first with the cane and your weaker foot. Then follow with your stronger foot. Always hold the railing with your free hand. Safety tips for home Take these steps to make your home safer when you are walking with a cane: Be familiar with your home environment. Have sturdy handrails in your bathrooms and hallways. Wear nonslip, comfortable, well-fitting shoes. Use night-lights in the dark. Keep floor  surfaces clean and dry. Keep high-traffic areas uncluttered. Remove any rugs, cords, or loose objects from the floor. Contact a health care provider if: You still feel unsteady on your feet while using the cane. You develop new pain, such as pain in your back, shoulder, wrist, or hip. You develop any numbness or tingling. Get help right away if: You fall. Summary Using a cane makes you more stable, reduces pain, and eases strain on certain muscle groups. A cane fits properly if the top of the cane comes to your wrist joint when you are standing upright with your arm relaxed at your side. The best kind of cane for you depends on what you need it for. Hold your cane in the hand opposite the injured or weaker side. Always move the cane and the foot of the weaker side with each other (in unison). This information is not intended to replace advice given to you by your health care provider. Make sure you discuss any questions you have with your health care provider. Document Revised: 09/06/2020 Document Reviewed: 09/06/2020 Elsevier Patient Education  2023 Elsevier Inc.  

## 2022-07-06 NOTE — Progress Notes (Signed)
Subjective:    Patient ID: Becky Farrell, female    DOB: 28-Dec-1933, 88 y.o.   MRN: [id number]  HPI  Patient presents to clinic today for ER follow-up.  She presented to the ER 7/30 with complaint of dizziness and difficulty with balance.  ECG showed normal sinus rhythm.  Labs were unremarkable.  CT head showed:  IMPRESSION: 1. No acute intracranial abnormality. 2. Mild-to-moderate chronic microvascular ischemic disease.  CTA brain showed:  IMPRESSION: 1. Negative CTA for large vessel occlusion or other emergent finding. 2. Mild atheromatous change about the carotid bifurcations and carotid siphons without hemodynamically significant stenosis. 3. Diffuse tortuosity of the major arterial vasculature of the head and neck, suggesting chronic underlying hypertension. 4. Mild interlobular septal thickening within the visualized lungs, suggesting mild pulmonary interstitial congestion/edema.  MRI brain showed:  IMPRESSION: Chronic atrophic and ischemic changes without acute abnormality.   She was discharged and advised to follow-up with her PCP.  Since that time, she reports intermittent dizziness. She does wakeup feeling dizzy at times. The dizziness is not worse with position changes. She has been seen by ENT and cardiology in the past.  Review of Systems     Past Medical History:  Diagnosis Date   Colon polyps    Family history of breast cancer    History of pulmonary embolism 2011   Hx of melanoma of skin 2003   Right leg   Hypertension    Melanoma ([place]) 2003   right lower leg. Treated in [place].   Personal history of radiation therapy    Seizures ([place]) 1993   no meds since (approx) 2014   Vertigo    x1. R/T perforated ear drum prior to 2013    Current Outpatient Medications  Medication Sig Dispense Refill   aspirin 81 MG tablet Take 81 mg by mouth daily.     Cholecalciferol (VITAMIN D3) 2000 UNITS TABS Take 2,000 Units by mouth daily.      denosumab (PROLIA) 60 MG/ML  SOSY injection Inject 60 mg into the skin every 6 (six) months.     ketoconazole (NIZORAL) 2 % cream Apply to rash under breasts 2 times a day until rash improved. 60 g 2   letrozole (FEMARA) 2.5 MG tablet TAKE 1 TABLET BY MOUTH EVERY DAY 90 tablet 3   lisinopril (ZESTRIL) 30 MG tablet Take 1 tablet (30 mg total) by mouth daily. 90 tablet 1   metoprolol tartrate (LOPRESSOR) 25 MG tablet TAKE 1 TABLET BY MOUTH TWICE A DAY 180 tablet 1   metroNIDAZOLE (METROCREAM) 0.75 % cream Apply to nose every night for rosacea. 45 g 1   neomycin-polymyxin b-dexamethasone (MAXITROL) 3.5-10000-0.1 OINT SMARTSIG:1 sparingly In Eye(s) Every Night     polyethylene glycol (MIRALAX / GLYCOLAX) packet Take 17 g by mouth every other day.     vitamin B-12 (CYANOCOBALAMIN) 1000 MCG tablet Take 1,000 mcg by mouth daily.     warfarin (COUMADIN) 5 MG tablet TAKE ONE TABLET BY MOUTH DAILY EXCEPT TAKE 7.'5MG'$  ON MONDAYS AND THURSDAYS OR AS DIRECTED BY COUMADIN CLINIC 105 tablet 1   Wheat Dextrin (BENEFIBER PO) Take 2 each by mouth every other day.     No current facility-administered medications for this visit.    Allergies  Allergen Reactions   Clobetasol Other (See Comments)    vertigo   Clobetasol Propionate Other (See Comments)    This is ointment form UNSPECIFIED REACTION    Phenergan [Promethazine Hcl] Other (See Comments)  Rapid heart rate   Dextromethorphan Palpitations and Other (See Comments)    promethazine with DM, rapid hearbeat   Zithromax [Azithromycin] Palpitations and Other (See Comments)    Per OSH report, pt unable to recall Rapid heart rate    Family History  Problem Relation Age of Onset   Heart disease Mother    Aortic aneurysm Father    Breast cancer Daughter 84   Breast cancer Niece 75    Social History   Socioeconomic History   Marital status: Married    Spouse name: Not on file   Number of children: Not on file   Years of education: Not on file   Highest education level: Not  on file  Occupational History   Not on file  Tobacco Use   Smoking status: Never   Smokeless tobacco: Never  Vaping Use   Vaping Use: Never used  Substance and Sexual Activity   Alcohol use: No   Drug use: Never   Sexual activity: Never  Other Topics Concern   Not on file  Social History Narrative   Recently moved from [place] to [facility].   Daughter and grandchildren live in [place].   Has other children in [place].   Retired Pharmacist, hospital.      Husband has been recovering from Prostate CA.   Social Determinants of Health   Financial Resource Strain: Not on file  Food Insecurity: Not on file  Transportation Needs: Not on file  Physical Activity: Not on file  Stress: Not on file  Social Connections: Not on file  Intimate Partner Violence: Not on file     Constitutional: Denies fever, malaise, fatigue, headache or abrupt weight changes.  HEENT: Denies eye pain, eye redness, ear pain, ringing in the ears, wax buildup, runny nose, nasal congestion, bloody nose, or sore throat. Respiratory: Denies difficulty breathing, shortness of breath, cough or sputum production.   Cardiovascular: Denies chest pain, chest tightness, palpitations or swelling in the hands or feet.  Gastrointestinal: Denies abdominal pain, bloating, constipation, diarrhea or blood in the stool.  GU: Denies urgency, frequency, pain with urination, burning sensation, blood in urine, odor or discharge. Musculoskeletal: Pt reports intermittent left low back pain. Denies decrease in range of motion, difficulty with gait, muscle pain or joint swelling.  Skin: Denies redness, rashes, lesions or ulcercations.  Neurological: Pt reports intermittent dizziness,  difficulty with balance. Denies difficulty with memory, difficulty with speech or problems with coordination.  Psych: Denies anxiety, depression, SI/HI.  No other specific complaints in a complete review of systems (except as listed in HPI above).  Objective:    Physical Exam  BP 138/84 (BP Location: Left Arm, Patient Position: Sitting, Cuff Size: Normal)   Pulse 63   Temp 98 F (36.7 C) (Temporal)   Wt 127 lb (57.6 kg)   SpO2 99%   BMI 23.23 kg/m   Wt Readings from Last 3 Encounters:  07/01/22 129 lb (58.5 kg)  06/13/22 128 lb (58.1 kg)  03/22/22 131 lb 11.2 oz (59.7 kg)    General: Appears her stated age, well developed, well nourished in NAD. Skin: Warm, dry and intact. No rashes, lesions or ulcerations noted. HEENT: Head: normal shape and size; Eyes: sclera white, no icterus, conjunctiva pink, PERRLA and EOMs intact; Ears: Tm's gray and intact, normal light reflex;  Cardiovascular: Normal rate and rhythm. S1,S2 noted.  No murmur, rubs or gallops noted. No JVD or BLE edema. No carotid bruits noted. Pulmonary/Chest: Normal effort and positive vesicular  breath sounds. No respiratory distress. No wheezes, rales or ronchi noted.  Musculoskeletal: Gait slow and steady without device. Neurological: Alert and oriented. Coordination normal.    BMET    Component Value Date/Time   NA 138 07/01/2022 2105   NA 140 04/22/2013 0237   K 3.6 07/01/2022 2105   K 3.6 04/22/2013 0237   CL 105 07/01/2022 2105   CL 106 04/22/2013 0237   CO2 24 07/01/2022 2105   CO2 29 04/22/2013 0237   GLUCOSE 115 (H) 07/01/2022 2105   GLUCOSE 88 04/22/2013 0237   BUN 17 07/01/2022 2105   BUN 26 (H) 04/22/2013 0237   CREATININE 0.74 07/01/2022 2105   CREATININE 0.74 01/01/2014 1556   CALCIUM 9.4 07/01/2022 2105   CALCIUM 9.7 04/22/2013 0237   GFRNONAA >60 07/01/2022 2105   GFRNONAA >60 04/22/2013 0237   GFRAA >60 08/10/2020 1025   GFRAA >60 04/22/2013 0237    Lipid Panel     Component Value Date/Time   CHOL 215 (H) 10/09/2021 1406   TRIG 82 10/09/2021 1406   HDL 78 10/09/2021 1406   CHOLHDL 2.8 10/09/2021 1406   VLDL 16.0 04/26/2020 1503   LDLCALC 119 (H) 10/09/2021 1406    CBC    Component Value Date/Time   WBC 5.6 07/01/2022 2105   RBC 4.33  07/01/2022 2105   HGB 13.5 07/01/2022 2105   HGB 13.8 05/05/2014 1218   HCT 41.3 07/01/2022 2105   HCT 36.4 05/12/2014 0628   PLT 191 07/01/2022 2105   PLT 163 05/05/2014 1218   MCV 95.4 07/01/2022 2105   MCV 97 05/05/2014 1218   MCH 31.2 07/01/2022 2105   MCHC 32.7 07/01/2022 2105   RDW 13.2 07/01/2022 2105   RDW 13.6 05/05/2014 1218   LYMPHSABS 0.9 01/22/2022 1414   MONOABS 0.5 01/22/2022 1414   EOSABS 0.2 01/22/2022 1414   BASOSABS 0.0 01/22/2022 1414    Hgb A1C Lab Results  Component Value Date   HGBA1C 5.3 10/09/2021          Assessment & Plan:   ER follow-up for Dizziness, Balance Disorder:  ER notes, labs and imaging reviewed Will continue current meds at this time Encouraged position changes slowly Encouraged adequate water intake  RTC in 3 months for follow-up of chronic conditions [name], NP

## 2022-07-06 NOTE — Telephone Encounter (Signed)
No change in dosing at this time.  Repeat PT and INR in 2 weeks

## 2022-07-09 ENCOUNTER — Telehealth: Payer: Self-pay

## 2022-07-09 NOTE — Telephone Encounter (Signed)
     Patient  visit on 7/30  at [facility] was for dizziness  Have you been able to follow up with your primary care physician?yes  The patient was or was not able to obtain any needed medicine or equipment. na  Are there diet recommendations that you are having difficulty following?na  Patient expresses understanding of discharge instructions and education provided has no other needs at this time.  yes    [facility]  [phone] [address], [place], [place] [address] Phone: [phone] Email: [email or web address]

## 2022-07-11 ENCOUNTER — Other Ambulatory Visit: Payer: Self-pay | Admitting: Dermatology

## 2022-07-11 DIAGNOSIS — L304 Erythema intertrigo: Secondary | ICD-10-CM

## 2022-07-12 ENCOUNTER — Encounter: Payer: Self-pay | Admitting: Podiatry

## 2022-07-12 ENCOUNTER — Ambulatory Visit (INDEPENDENT_AMBULATORY_CARE_PROVIDER_SITE_OTHER): Payer: Medicare Other | Admitting: Podiatry

## 2022-07-12 DIAGNOSIS — M79609 Pain in unspecified limb: Secondary | ICD-10-CM

## 2022-07-12 DIAGNOSIS — B351 Tinea unguium: Secondary | ICD-10-CM | POA: Diagnosis not present

## 2022-07-12 DIAGNOSIS — D689 Coagulation defect, unspecified: Secondary | ICD-10-CM

## 2022-07-12 NOTE — Progress Notes (Signed)
This patient returns to my office for at risk foot care.  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  This patient is unable to cut nails herself since the patient cannot reach her nails.These nails are painful walking and wearing shoes.  This patient presents for at risk foot care today.  Patient was given ketoconazole by dermatologist and she see great improvement in her feet.  General Appearance  Alert, conversant and in no acute stress.  Vascular  Dorsalis pedis and posterior tibial  pulses are palpable  bilaterally.  Capillary return is within normal limits  bilaterally. Temperature is within normal limits  bilaterally.  Neurologic  Senn-Weinstein monofilament wire test within normal limits  bilaterally. Muscle power within normal limits bilaterally.  Nails Thick disfigured discolored nails with subungual debris  from hallux to fifth toes bilaterally. No evidence of bacterial infection or drainage bilaterally.  Orthopedic  No limitations of motion  feet .  No crepitus or effusions noted.  No bony pathology or digital deformities noted.  Skin  normotropic skin with no porokeratosis noted bilaterally.  No signs of infections or ulcers noted.     Onychomycosis  Pain in right toes  Pain in left toes  Consent was obtained for treatment procedures.   Mechanical debridement of nails 1-5  bilaterally performed with a nail nipper.  Filed with dremel without incident.    Return office visit    3 months                  Told patient to return for periodic foot care and evaluation due to potential at risk complications.   Wendie Diskin DPM  

## 2022-07-19 DIAGNOSIS — Z7901 Long term (current) use of anticoagulants: Secondary | ICD-10-CM | POA: Diagnosis not present

## 2022-07-19 DIAGNOSIS — D802 Selective deficiency of immunoglobulin A [IgA]: Secondary | ICD-10-CM | POA: Diagnosis not present

## 2022-07-19 DIAGNOSIS — Z86718 Personal history of other venous thrombosis and embolism: Secondary | ICD-10-CM | POA: Diagnosis not present

## 2022-07-19 DIAGNOSIS — Z86711 Personal history of pulmonary embolism: Secondary | ICD-10-CM | POA: Diagnosis not present

## 2022-07-19 LAB — PROTIME-INR: INR: 2.7 — AB (ref 0.80–1.20)

## 2022-07-20 ENCOUNTER — Other Ambulatory Visit: Payer: Self-pay

## 2022-07-20 DIAGNOSIS — M810 Age-related osteoporosis without current pathological fracture: Secondary | ICD-10-CM

## 2022-07-20 DIAGNOSIS — Z170 Estrogen receptor positive status [ER+]: Secondary | ICD-10-CM

## 2022-07-23 ENCOUNTER — Inpatient Hospital Stay: Payer: Medicare Other | Attending: Oncology

## 2022-07-23 ENCOUNTER — Inpatient Hospital Stay (HOSPITAL_BASED_OUTPATIENT_CLINIC_OR_DEPARTMENT_OTHER): Payer: Medicare Other | Admitting: Oncology

## 2022-07-23 ENCOUNTER — Encounter: Payer: Self-pay | Admitting: Oncology

## 2022-07-23 ENCOUNTER — Inpatient Hospital Stay: Payer: Medicare Other

## 2022-07-23 ENCOUNTER — Other Ambulatory Visit: Payer: Self-pay

## 2022-07-23 ENCOUNTER — Telehealth: Payer: Self-pay

## 2022-07-23 VITALS — BP 171/95 | HR 67 | Temp 97.7°F | Resp 18 | Ht 62.0 in | Wt 130.0 lb

## 2022-07-23 DIAGNOSIS — Z79811 Long term (current) use of aromatase inhibitors: Secondary | ICD-10-CM

## 2022-07-23 DIAGNOSIS — C50211 Malignant neoplasm of upper-inner quadrant of right female breast: Secondary | ICD-10-CM | POA: Diagnosis not present

## 2022-07-23 DIAGNOSIS — Z86711 Personal history of pulmonary embolism: Secondary | ICD-10-CM | POA: Insufficient documentation

## 2022-07-23 DIAGNOSIS — Z7901 Long term (current) use of anticoagulants: Secondary | ICD-10-CM | POA: Diagnosis not present

## 2022-07-23 DIAGNOSIS — Z85820 Personal history of malignant melanoma of skin: Secondary | ICD-10-CM | POA: Diagnosis not present

## 2022-07-23 DIAGNOSIS — Z923 Personal history of irradiation: Secondary | ICD-10-CM | POA: Insufficient documentation

## 2022-07-23 DIAGNOSIS — M810 Age-related osteoporosis without current pathological fracture: Secondary | ICD-10-CM

## 2022-07-23 DIAGNOSIS — Z170 Estrogen receptor positive status [ER+]: Secondary | ICD-10-CM

## 2022-07-23 DIAGNOSIS — Z803 Family history of malignant neoplasm of breast: Secondary | ICD-10-CM | POA: Diagnosis not present

## 2022-07-23 LAB — COMPREHENSIVE METABOLIC PANEL
ALT: 13 U/L (ref 0–44)
AST: 20 U/L (ref 15–41)
Albumin: 3.8 g/dL (ref 3.5–5.0)
Alkaline Phosphatase: 55 U/L (ref 38–126)
Anion gap: 6 (ref 5–15)
BUN: 20 mg/dL (ref 8–23)
CO2: 27 mmol/L (ref 22–32)
Calcium: 9.5 mg/dL (ref 8.9–10.3)
Chloride: 105 mmol/L (ref 98–111)
Creatinine, Ser: 0.75 mg/dL (ref 0.44–1.00)
GFR, Estimated: >60 mL/min (ref >60)
Glucose, Bld: 102 mg/dL — ABNORMAL HIGH (ref 70–99)
Potassium: 4.2 mmol/L (ref 3.5–5.1)
Sodium: 138 mmol/L (ref 135–145)
Total Bilirubin: 0.6 mg/dL (ref 0.3–1.2)
Total Protein: 6.8 g/dL (ref 6.5–8.1)

## 2022-07-23 LAB — CBC WITH DIFFERENTIAL/PLATELET
Abs Immature Granulocytes: 0.02 10*3/uL (ref 0.00–0.07)
Basophils Absolute: 0 10*3/uL (ref 0.0–0.1)
Basophils Relative: 0 %
Eosinophils Absolute: 0.1 10*3/uL (ref 0.0–0.5)
Eosinophils Relative: 2 %
HCT: 39.6 % (ref 36.0–46.0)
Hemoglobin: 13.2 g/dL (ref 12.0–15.0)
Immature Granulocytes: 0 %
Lymphocytes Relative: 19 %
Lymphs Abs: 1.1 10*3/uL (ref 0.7–4.0)
MCH: 31.8 pg (ref 26.0–34.0)
MCHC: 33.3 g/dL (ref 30.0–36.0)
MCV: 95.4 fL (ref 80.0–100.0)
Monocytes Absolute: 0.5 10*3/uL (ref 0.1–1.0)
Monocytes Relative: 9 %
Neutro Abs: 3.9 10*3/uL (ref 1.7–7.7)
Neutrophils Relative %: 70 %
Platelets: 194 10*3/uL (ref 150–400)
RBC: 4.15 MIL/uL (ref 3.87–5.11)
RDW: 13.1 % (ref 11.5–15.5)
WBC: 5.6 10*3/uL (ref 4.0–10.5)
nRBC: 0 % (ref 0.0–0.2)

## 2022-07-23 MED ORDER — DENOSUMAB 60 MG/ML ~~LOC~~ SOSY
60.0000 mg | PREFILLED_SYRINGE | Freq: Once | SUBCUTANEOUS | Status: AC
  Administered 2022-07-23: 60 mg via SUBCUTANEOUS
  Filled 2022-07-23: qty 1

## 2022-07-23 NOTE — Progress Notes (Signed)
Hematology/Oncology Progress note [phone] [phone]      Clinic Day:  07/23/2022  Referring physician: Jearld Fenton, NP  Chief Complaint: Becky Farrell is a 88 y.o. female presents for follow up of stage I right breast cancer and osteoporosis- on prolia  PERTINENT ONCOLOGY HISTORY Patient previously followed up by Dr.Corcoran, patient switched care to me on 07/12/21 Extensive medical record review was performed by me  stage T1aNx right breast cancer s/p lumpectomy on 10/10/2018. Pathology revealed a 0.4 cm grade I invasive ductal carcinoma with mucinous features.  There was intermediate grade DCIS.  Margins were uninvolved (1.0 cm posterior margin).  There were fibrocystic changes including apocrine metaplasia.  Tumor was ER + (100%), PR + (90%), and Her2/neu-.  Ki67 was 10%.  Pathologic stage was T1aNx.  CA27.29 was 14.1 on 09/08/2018.    12/16/2018 - 02/03/2019.She received radiation. She received 5040 cGy in 28 fractions with a scar boost of another 1400 cGy using electron beam.   06/09/2019.  Patient is started on letrozole.  #Osteoporosis 07/31/2018 DEXA revealed osteoporosis with a T-score of -3.4 in the right forearm radius and -1.9 in the right femoral neck.   08/09/2020 DEXA revealed osteoporosis with a T-score of -3.2 in the forearm radius.  Right femoral neck revealed osteopenia with a T-score of -2.0.   07/07/2019 patient started Prolia every 6 months.  #History of pulmonary embolism x2.  Initial event occurred in 2011.  Recurrent pulmonary embolism after holding anticoagulation for 1 month for planned colonoscopy.  Patient is currently on lifelong anticoagulation with Coumadin.  Managed by primary care provider.  #Family history of breast cancer-[daughter and niece].  [facility] genetic testing was negative.  #Patient has history of seizure and previously on Tegretol and is currently off.  She has not had any recent seizure/spells she follows  up with neurology Dr. Melrose Nakayama. chronic ataxia/sensory loss/difficulty walking.  Neurology note was reviewed.     . INTERVAL HISTORY Becky Farrell is a 88 y.o. female who has above history reviewed by me today presents for follow up visit for management of history of stage I right breast cancer, osteoporosis Patient takes letrozole and overall tolerates well.  Manageable side effects. Today patient reports no breast concerns. No new complaints.  Recent ER visit on 7/30/ 2023 due to high blood pressure, headache. 07/01/2022 CT head without contrast showed chronic atrophic and ischemic changes 07/02/2022, CT angio head and neck with and without contrast negative for large vessel occlusion or other emergent findings.  Chronic findings please refer to CT reports. 06/22/2022, MRI brain without contrast showed no acute intracranial abnormality.  Mild to moderate chronic microvascular ischemic disease.  Past Medical History:  Diagnosis Date   Colon polyps    Family history of breast cancer    History of pulmonary embolism 2011   Hx of melanoma of skin 2003   Right leg   Hypertension    Melanoma ([place]) 2003   right lower leg. Treated in [place].   Personal history of radiation therapy    Seizures ([place]) 1993   no meds since (approx) 2014   Vertigo    x1. R/T perforated ear drum prior to 2013    Past Surgical History:  Procedure Laterality Date   ABDOMINAL HYSTERECTOMY  05/2014   BREAST BIOPSY Right 08/20/2018   invasive mammary carcinoma/DCIS    BREAST LUMPECTOMY Right 10/10/2018   BREAST LUMPECTOMY WITH RADIOACTIVE SEED LOCALIZATION Right 10/10/2018   Procedure: BREAST LUMPECTOMY WITH RADIOACTIVE SEED  LOCALIZATION;  Surgeon: Jovita Kussmaul, MD;  Location: [facility];  Service: General;  Laterality: Right;   CATARACT EXTRACTION W/PHACO Right 01/06/2018   Procedure: CATARACT EXTRACTION PHACO AND INTRAOCULAR LENS PLACEMENT ([place]) RIGHT;  Surgeon: Leandrew Koyanagi, MD;  Location: [facility];   Service: Ophthalmology;  Laterality: Right;   CATARACT EXTRACTION W/PHACO Left 01/29/2018   Procedure: CATARACT EXTRACTION PHACO AND INTRAOCULAR LENS PLACEMENT ([place]) LEFT;  Surgeon: Leandrew Koyanagi, MD;  Location: [facility];  Service: Ophthalmology;  Laterality: Left;   left hand surgery  05/2011   s/p fall   OTHER SURGICAL HISTORY  2003   Removal of lymphoma from melonoma 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     Family History  Problem Relation Age of Onset   Heart disease Mother    Aortic aneurysm Father    Breast cancer Daughter 67   Breast cancer Niece 19    Social History:  reports that she has never smoked. She has never used smokeless tobacco. She reports that she does not drink alcohol and does not use drugs.  Patient moved from [place] to [place].  Her daughter and grandchildren live in [place].  She has other children in [place].   She is a retired Pharmacist, hospital. 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   Allergies:  Allergies  Allergen Reactions   Clobetasol Other (See Comments)    vertigo   Clobetasol Propionate Other (See Comments)    This is ointment form UNSPECIFIED REACTION    Phenergan [Promethazine Hcl] Other (See Comments)    Rapid heart rate   Dextromethorphan Palpitations and Other (See Comments)    promethazine with DM, rapid hearbeat   Zithromax [Azithromycin] Palpitations and Other (See Comments)    Per OSH report, pt unable to recall Rapid heart rate    Current Medications: Current Outpatient Medications  Medication Sig Dispense Refill   aspirin 81 MG tablet Take 81 mg by mouth daily.     Cholecalciferol (VITAMIN D3) 2000 UNITS TABS Take 2,000 Units by mouth daily.      denosumab (PROLIA) 60 MG/ML SOSY injection Inject 60 mg into the skin every 6 (six) months.     ketoconazole (NIZORAL) 2 % cream APPLY TO RASH UNDER BREASTS 2 TIMES A DAY UNTIL RASH IMPROVED. 60 g 2   letrozole (FEMARA) 2.5 MG tablet TAKE 1 TABLET BY MOUTH EVERY DAY 90 tablet 3   lisinopril  (ZESTRIL) 30 MG tablet Take 1 tablet (30 mg total) by mouth daily. 90 tablet 1   metoprolol tartrate (LOPRESSOR) 25 MG tablet TAKE 1 TABLET BY MOUTH TWICE A DAY 180 tablet 1   metroNIDAZOLE (METROCREAM) 0.75 % cream Apply to nose every night for rosacea. 45 g 1   neomycin-polymyxin b-dexamethasone (MAXITROL) 3.5-10000-0.1 OINT SMARTSIG:1 sparingly In Eye(s) Every Night     polyethylene glycol (MIRALAX / GLYCOLAX) packet Take 17 g by mouth every other day.     vitamin B-12 (CYANOCOBALAMIN) 1000 MCG tablet Take 1,000 mcg by mouth daily.     warfarin (COUMADIN) 5 MG tablet TAKE ONE TABLET BY MOUTH DAILY EXCEPT TAKE 7.$RemoveBefor'5MG'uKDtfYYggIrA$  ON MONDAYS AND THURSDAYS OR AS DIRECTED BY COUMADIN CLINIC 105 tablet 1   Wheat Dextrin (BENEFIBER PO) Take 2 each by mouth every other day.     No current facility-administered medications for this visit.   Review of Systems  Constitutional:  Negative for chills, diaphoresis, fever, malaise/fatigue and weight loss (up 1 lb).  HENT:  Positive for hearing loss. Negative for congestion, ear discharge,  ear pain, nosebleeds, sinus pain, sore throat and tinnitus.   Eyes: Negative.  Negative for blurred vision, double vision and photophobia.  Respiratory:  Negative for cough, hemoptysis, sputum production and shortness of breath.        Recurrent PE x 2.  Cardiovascular: Negative.  Negative for chest pain, palpitations, orthopnea, leg swelling and PND.  Gastrointestinal: Negative.  Negative for abdominal pain, blood in stool, constipation, diarrhea, heartburn, melena, nausea and vomiting.  Genitourinary: Negative.  Negative for dysuria, frequency, hematuria and urgency.  Musculoskeletal:  Negative for back pain, falls, joint pain, myalgias and neck pain.       Osteoporosis.  Skin: Negative.  Negative for itching and rash.  Neurological:  Positive for dizziness and tingling (hands and sometimes feet). Negative for tremors, sensory change, speech change, focal weakness, seizures  (history), weakness and headaches.       Chronic balance issues.  Endo/Heme/Allergies:  Does not bruise/bleed easily (on Coumadin).  Psychiatric/Behavioral: Negative.  Negative for depression and memory loss. The patient is not nervous/anxious and does not have insomnia.   All other systems reviewed and are negative.  Performance status (ECOG): 1  Vitals Blood pressure (!) 171/95, pulse 67, temperature 97.7 F (36.5 C), resp. rate 18, height $RemoveBe'5\' 2"'UQOLRjwmv$  (1.575 m), weight 130 lb (59 kg).   Physical Exam Vitals and nursing note reviewed.  Constitutional:      General: She is not in acute distress.    Appearance: She is well-developed. She is not diaphoretic.  HENT:     Head: Normocephalic and atraumatic.  Eyes:     General: No scleral icterus.    Conjunctiva/sclera: Conjunctivae normal.  Neck:     Vascular: No JVD.  Cardiovascular:     Rate and Rhythm: Normal rate.     Heart sounds: Normal heart sounds. No murmur heard. Pulmonary:     Effort: Pulmonary effort is normal. No respiratory distress.     Breath sounds: Normal breath sounds. No wheezing or rales.  Musculoskeletal:        General: No swelling or tenderness.  Skin:    General: Skin is warm and dry.  Neurological:     Mental Status: She is alert and oriented to person, place, and time.  Psychiatric:        Mood and Affect: Mood normal.   .    Appointment on 07/23/2022  Component Date Value Ref Range Status   Sodium 07/23/2022 138  135 - 145 mmol/L Final   Potassium 07/23/2022 4.2  3.5 - 5.1 mmol/L Final   Chloride 07/23/2022 105  98 - 111 mmol/L Final   CO2 07/23/2022 27  22 - 32 mmol/L Final   Glucose, Bld 07/23/2022 102 (H)  70 - 99 mg/dL Final   Glucose reference range applies only to samples taken after fasting for at least 8 hours.   BUN 07/23/2022 20  8 - 23 mg/dL Final   Creatinine, Ser 07/23/2022 0.75  0.44 - 1.00 mg/dL Final   Calcium 07/23/2022 9.5  8.9 - 10.3 mg/dL Final   Total Protein 07/23/2022 6.8   6.5 - 8.1 g/dL Final   Albumin 07/23/2022 3.8  3.5 - 5.0 g/dL Final   AST 07/23/2022 20  15 - 41 U/L Final   ALT 07/23/2022 13  0 - 44 U/L Final   Alkaline Phosphatase 07/23/2022 55  38 - 126 U/L Final   Total Bilirubin 07/23/2022 0.6  0.3 - 1.2 mg/dL Final   GFR, Estimated 07/23/2022 >60  >  60 mL/min Final   Comment: (NOTE) Calculated using the CKD-EPI Creatinine Equation (2021)    Anion gap 07/23/2022 6  5 - 15 Final   Performed at [facility], [address] [place], [place] [address]   WBC 07/23/2022 5.6  4.0 - 10.5 K/uL Final   RBC 07/23/2022 4.15  3.87 - 5.11 MIL/uL Final   Hemoglobin 07/23/2022 13.2  12.0 - 15.0 g/dL Final   HCT 07/23/2022 39.6  36.0 - 46.0 % Final   MCV 07/23/2022 95.4  80.0 - 100.0 fL Final   MCH 07/23/2022 31.8  26.0 - 34.0 pg Final   MCHC 07/23/2022 33.3  30.0 - 36.0 g/dL Final   RDW 07/23/2022 13.1  11.5 - 15.5 % Final   Platelets 07/23/2022 194  150 - 400 K/uL Final   nRBC 07/23/2022 0.0  0.0 - 0.2 % Final   Neutrophils Relative % 07/23/2022 70  % Final   Neutro Abs 07/23/2022 3.9  1.7 - 7.7 K/uL Final   Lymphocytes Relative 07/23/2022 19  % Final   Lymphs Abs 07/23/2022 1.1  0.7 - 4.0 K/uL Final   Monocytes Relative 07/23/2022 9  % Final   Monocytes Absolute 07/23/2022 0.5  0.1 - 1.0 K/uL Final   Eosinophils Relative 07/23/2022 2  % Final   Eosinophils Absolute 07/23/2022 0.1  0.0 - 0.5 K/uL Final   Basophils Relative 07/23/2022 0  % Final   Basophils Absolute 07/23/2022 0.0  0.0 - 0.1 K/uL Final   Immature Granulocytes 07/23/2022 0  % Final   Abs Immature Granulocytes 07/23/2022 0.02  0.00 - 0.07 K/uL Final   Performed at [facility], [address] [place], [place] [address]    Assessment and Plan 1. Carcinoma of upper-inner quadrant of right breast in female, estrogen receptor positive ([place])   2. Osteoporosis without current pathological fracture, unspecified osteoporosis type   3. Chronic anticoagulation   4. Aromatase  inhibitor use     # Stage T1aNx right breast cancer [ invasive ductal carcinoma with mucinous features] -08/2018  s/p surgery and radiation Labs were reviewed and discussed with patient.  Continue letrozole 2.5 mg daily.  Plan 5 years if she is able to tolerate-till 08/2023 Continue annual mammogram.-Obtained by primary care provider. CA 27.29 has been followed up in the past. Not clear clinic utility of tumor marker in her case.  .   # Osteoporosis, chronic AI use Recommend to repeat bone density-scheduled in September 2023 Proceed with Prolia today.  Recommend calcium 1200 mg daily supplementation. Patient prefers to increase her calcium rich food intake rather than calcium supplementation.  We had a lengthy discussion of my concern of dietary calcium uptake is most likely not going to be sufficient to meet her daily needs.  Patient prefers to try calcium rich food first.   # History of recurrent PE, continue long term anticoagulation with coumadin. Follows up with coumadin clinic.   # RTC in 6 months for MD assess, labs (CBC with diff, CMP), and Prolia.  I discussed the assessment and treatment plan with the patient. The patient was provided an opportunity to ask questions and all were answered. The patient agreed with the plan and demonstrated an understanding of the instructions. The patient was advised to call back if the symptoms worsen or if the condition fails to improve as anticipated.   We spent sufficient time to discuss many aspect of care, questions were answered to patient's satisfaction.  Earlie Server, MD, PhD Hematology  Oncology 07/23/2022

## 2022-07-23 NOTE — Telephone Encounter (Signed)
Within range.  Continue current Coumadin dose.  Recheck PT/INR in 1 month

## 2022-07-23 NOTE — Addendum Note (Signed)
Addended by: Earlie Server on: 07/23/2022 10:49 PM   Modules accepted: Orders

## 2022-07-23 NOTE — Telephone Encounter (Signed)
LMTCB 07/24/2023.  PEC please advise Bethel Born when she calls back.   Thanks,   -Mickel Baas

## 2022-07-23 NOTE — Telephone Encounter (Signed)
INR 2.7 PT 26.0 Lab results form 07/19/22 Caller was [place] from [employer]. Routing high priority to office.

## 2022-07-24 NOTE — Telephone Encounter (Signed)
Becky Farrell, spoke with her about patient lab results. Verbalize understanding.

## 2022-07-29 ENCOUNTER — Other Ambulatory Visit: Payer: Self-pay | Admitting: Internal Medicine

## 2022-07-29 DIAGNOSIS — Z7901 Long term (current) use of anticoagulants: Secondary | ICD-10-CM

## 2022-07-30 NOTE — Telephone Encounter (Signed)
Requested medication (s) are due for refill today - yes  Requested medication (s) are on the active medication list -yes  Future visit scheduled -yes  Last refill: 08/15/21 #105 1RF  Notes to clinic: requires manual review- provider review   Requested Prescriptions  Pending Prescriptions Disp Refills   warfarin (COUMADIN) 5 MG tablet [Pharmacy Med Name: WARFARIN SODIUM 5 MG TABLET] 105 tablet 1    Sig: TAKE ONE TABLET BY MOUTH DAILY EXCEPT TAKE 7.'5MG'$  ON [place]     Hematology:  Anticoagulants - warfarin Failed - 07/30/2022  2:28 PM      Failed - Manual Review: If patient's warfarin is managed by Anti-Coag team, route request to them. If not, route request to the provider.      Failed - INR in normal range and within 30 days    INR  Date Value Ref Range Status  07/01/2022 2.5 (H) 0.8 - 1.2 Final    Comment:    (NOTE) INR goal varies based on device and disease states. Performed at [facility], [address] [place], [place] [address]   05/10/2014 1.9  Final    Comment:    INR reference interval applies to patients on anticoagulant therapy. A single INR therapeutic range for coumarins is not optimal for all indications; however, the suggested range for most indications is 2.0 - 3.0. Exceptions to the INR Reference Range may include: Prosthetic heart valves, acute myocardial infarction, prevention of myocardial infarction, and combinations of aspirin and anticoagulant. The need for a higher or lower target INR must be assessed individually. Reference: The Pharmacology and Management of the Vitamin K  antagonists: the seventh ACCP Conference on Antithrombotic and Thrombolytic Therapy. CVELF.8101 Sept:126 (3suppl): N9146842. A HCT value >55% may artifactually increase the PT.  In one study,  the increase was an average of 25%. Reference:  "Effect on Routine and Special Coagulation Testing Values of Citrate  Anticoagulant Adjustment in Patients with High HCT Values." American Journal of Clinical Pathology 2006;126:400-405.          Passed - HCT in normal range and within 360 days    HCT  Date Value Ref Range Status  07/23/2022 39.6 36.0 - 46.0 % Final  05/12/2014 36.4 35.0 - 47.0 % Final         Passed - Patient is not pregnant      Passed - Valid encounter within last 3 months    Recent Outpatient Visits           3 weeks ago [facility] [place], [place] W, NP   6 months ago HTN, goal below 140/90   [place], Coralie Keens, NP   6 months ago Hypertensive urgency   [facility] [place], Coralie Keens, NP   8 months ago HTN, goal below 140/90   [facility] [place], Coralie Keens, NP   8 months ago Viral URI with cough   [facility] [place], Coralie Keens, NP       Future Appointments             In 2 months [place] Gunner, Coralie Keens, NP [facility], [place]   In 7 months Brendolyn Patty, MD [place]               Requested Prescriptions  Pending Prescriptions Disp Refills   warfarin (COUMADIN) 5 MG tablet [Pharmacy Med  Name: WARFARIN SODIUM 5 MG TABLET] 105 tablet 1    Sig: TAKE ONE TABLET BY MOUTH DAILY EXCEPT TAKE 7.'5MG'$  ON MONDAYS AND THURSDAYS OR AS DIRECTED BY COUMADIN CLINIC     Hematology:  Anticoagulants - warfarin Failed - 07/30/2022  2:28 PM      Failed - Manual Review: If patient's warfarin is managed by Anti-Coag team, route request to them. If not, route request to the provider.      Failed - INR in normal range and within 30 days    INR  Date Value Ref Range Status  07/01/2022 2.5 (H) 0.8 - 1.2 Final    Comment:    (NOTE) INR goal varies based on device and disease states. Performed at [facility], [address] [place], [place] [address]   05/10/2014 1.9  Final    Comment:    INR reference interval applies to patients on anticoagulant therapy. A  single INR therapeutic range for coumarins is not optimal for all indications; however, the suggested range for most indications is 2.0 - 3.0. Exceptions to the INR Reference Range may include: Prosthetic heart valves, acute myocardial infarction, prevention of myocardial infarction, and combinations of aspirin and anticoagulant. The need for a higher or lower target INR must be assessed individually. Reference: The Pharmacology and Management of the Vitamin K  antagonists: the seventh ACCP Conference on Antithrombotic and Thrombolytic Therapy. ZJQBH.4193 Sept:126 (3suppl): N9146842. A HCT value >55% may artifactually increase the PT.  In one study,  the increase was an average of 25%. Reference:  "Effect on Routine and Special Coagulation Testing Values of Citrate Anticoagulant Adjustment in Patients with High HCT Values." American Journal of Clinical Pathology 2006;126:400-405.          Passed - HCT in normal range and within 360 days    HCT  Date Value Ref Range Status  07/23/2022 39.6 36.0 - 46.0 % Final  05/12/2014 36.4 35.0 - 47.0 % Final         Passed - Patient is not pregnant      Passed - Valid encounter within last 3 months    Recent Outpatient Visits           3 weeks ago [facility] [place], [place] W, NP   6 months ago HTN, goal below 140/90   [place], Coralie Keens, NP   6 months ago Hypertensive urgency   [place], NP   8 months ago HTN, goal below 140/90   [facility] [place], Coralie Keens, NP   8 months ago Viral URI with cough   [facility] [place], Coralie Keens, NP       Future Appointments             In 2 months [place], Coralie Keens, NP [facility], [place]   In 7 months Brendolyn Patty, MD [place]

## 2022-07-31 DIAGNOSIS — H40053 Ocular hypertension, bilateral: Secondary | ICD-10-CM | POA: Diagnosis not present

## 2022-08-16 DIAGNOSIS — Z86718 Personal history of other venous thrombosis and embolism: Secondary | ICD-10-CM | POA: Diagnosis not present

## 2022-08-16 DIAGNOSIS — Z7901 Long term (current) use of anticoagulants: Secondary | ICD-10-CM | POA: Diagnosis not present

## 2022-08-16 DIAGNOSIS — Z86711 Personal history of pulmonary embolism: Secondary | ICD-10-CM | POA: Diagnosis not present

## 2022-08-20 ENCOUNTER — Ambulatory Visit
Admission: RE | Admit: 2022-08-20 | Discharge: 2022-08-20 | Disposition: A | Discharge status: Home / Self Care | Payer: Medicare Other | Source: Ambulatory Visit | Attending: Internal Medicine | Admitting: Internal Medicine

## 2022-08-20 ENCOUNTER — Telehealth: Payer: Self-pay | Admitting: Internal Medicine

## 2022-08-20 DIAGNOSIS — Z1231 Encounter for screening mammogram for malignant neoplasm of breast: Secondary | ICD-10-CM | POA: Insufficient documentation

## 2022-08-20 NOTE — Telephone Encounter (Signed)
Elijia with [facility] is calling to report some labs  INR 2.5  PT 24.1  Ref range 9 to 11.5 seconds  CB@  [phone]

## 2022-08-21 NOTE — Telephone Encounter (Signed)
Elijia advised.   Thanks,   -Mickel Baas

## 2022-08-21 NOTE — Telephone Encounter (Signed)
No change in dose. Recheck PT/INR in 1 month

## 2022-08-30 ENCOUNTER — Ambulatory Visit
Admission: RE | Admit: 2022-08-30 | Discharge: 2022-08-30 | Disposition: A | Discharge status: Home / Self Care | Payer: Medicare Other | Source: Ambulatory Visit | Attending: Oncology | Admitting: Oncology

## 2022-08-30 DIAGNOSIS — C50211 Malignant neoplasm of upper-inner quadrant of right female breast: Secondary | ICD-10-CM | POA: Insufficient documentation

## 2022-08-30 DIAGNOSIS — Z170 Estrogen receptor positive status [ER+]: Secondary | ICD-10-CM | POA: Insufficient documentation

## 2022-08-30 DIAGNOSIS — M810 Age-related osteoporosis without current pathological fracture: Secondary | ICD-10-CM | POA: Insufficient documentation

## 2022-09-05 ENCOUNTER — Other Ambulatory Visit: Payer: Self-pay | Admitting: Family

## 2022-09-05 ENCOUNTER — Other Ambulatory Visit: Payer: Self-pay | Admitting: Nurse Practitioner

## 2022-09-05 ENCOUNTER — Other Ambulatory Visit: Payer: Self-pay | Admitting: Internal Medicine

## 2022-09-05 DIAGNOSIS — C50211 Malignant neoplasm of upper-inner quadrant of right female breast: Secondary | ICD-10-CM

## 2022-09-05 NOTE — Telephone Encounter (Signed)
rx was sent to pharmacy on 04/02/22 #90/1  Requested Prescriptions  Pending Prescriptions Disp Refills  . lisinopril (ZESTRIL) 30 MG tablet [Pharmacy Med Name: LISINOPRIL 30 MG TABLET] 90 tablet 1    Sig: TAKE 1 TABLET BY MOUTH EVERY DAY     Cardiovascular:  ACE Inhibitors Failed - 09/05/2022 10:45 AM      Failed - Last BP in normal range    BP Readings from Last 1 Encounters:  07/23/22 (!) 171/95         Passed - Cr in normal range and within 180 days    Creat  Date Value Ref Range Status  01/01/2014 0.74 0.50 - 1.10 mg/dL Final   Creatinine, Ser  Date Value Ref Range Status  07/23/2022 0.75 0.44 - 1.00 mg/dL Final         Passed - K in normal range and within 180 days    Potassium  Date Value Ref Range Status  07/23/2022 4.2 3.5 - 5.1 mmol/L Final  04/22/2013 3.6 3.5 - 5.1 mmol/L Final         Passed - Patient is not pregnant      Passed - Valid encounter within last 6 months    Recent Outpatient Visits          2 months ago [facility] [place], Coralie Keens, NP   7 months ago HTN, goal below 140/90   [place], Coralie Keens, NP   8 months ago Hypertensive urgency   [place], Coralie Keens, NP   10 months ago HTN, goal below 140/90   [facility] [place], Coralie Keens, NP   10 months ago Viral URI with cough   [facility] [place], Coralie Keens, NP      Future Appointments            In 1 month [place], Coralie Keens, NP [facility], [place]   In 6 months Brendolyn Patty, MD [place]

## 2022-09-05 NOTE — Telephone Encounter (Signed)
Rx request sent to pharmacy.  

## 2022-09-17 ENCOUNTER — Encounter: Payer: Self-pay | Admitting: Oncology

## 2022-09-17 DIAGNOSIS — Z86711 Personal history of pulmonary embolism: Secondary | ICD-10-CM | POA: Diagnosis not present

## 2022-09-17 DIAGNOSIS — Z7901 Long term (current) use of anticoagulants: Secondary | ICD-10-CM | POA: Diagnosis not present

## 2022-09-17 DIAGNOSIS — Z86718 Personal history of other venous thrombosis and embolism: Secondary | ICD-10-CM | POA: Diagnosis not present

## 2022-09-18 DIAGNOSIS — Z23 Encounter for immunization: Secondary | ICD-10-CM | POA: Diagnosis not present

## 2022-09-19 ENCOUNTER — Telehealth: Payer: Self-pay

## 2022-09-19 NOTE — Telephone Encounter (Signed)
No changes in coumadin dosing. Re check INR in 1 month.

## 2022-09-19 NOTE — Telephone Encounter (Signed)
Resending high priority

## 2022-09-19 NOTE — Telephone Encounter (Signed)
INR: 2.2 PT: 21.9  Caller was Elijah from [facility]. Please call [facility] for orders. Sending high priority.

## 2022-09-24 ENCOUNTER — Other Ambulatory Visit: Payer: Self-pay | Admitting: Internal Medicine

## 2022-09-25 NOTE — Telephone Encounter (Signed)
Requested Prescriptions  Pending Prescriptions Disp Refills  . lisinopril (ZESTRIL) 30 MG tablet [Pharmacy Med Name: LISINOPRIL 30 MG TABLET] 90 tablet 0    Sig: TAKE 1 TABLET BY MOUTH EVERY DAY     Cardiovascular:  ACE Inhibitors Failed - 09/24/2022  5:44 PM      Failed - Last BP in normal range    BP Readings from Last 1 Encounters:  07/23/22 (!) 171/95         Passed - Cr in normal range and within 180 days    Creat  Date Value Ref Range Status  01/01/2014 0.74 0.50 - 1.10 mg/dL Final   Creatinine, Ser  Date Value Ref Range Status  07/23/2022 0.75 0.44 - 1.00 mg/dL Final         Passed - K in normal range and within 180 days    Potassium  Date Value Ref Range Status  07/23/2022 4.2 3.5 - 5.1 mmol/L Final  04/22/2013 3.6 3.5 - 5.1 mmol/L Final         Passed - Patient is not pregnant      Passed - Valid encounter within last 6 months    Recent Outpatient Visits          2 months ago [facility] [place], Coralie Keens, NP   8 months ago HTN, goal below 140/90   [place], Coralie Keens, NP   8 months ago Hypertensive urgency   [place], Coralie Keens, NP   10 months ago HTN, goal below 140/90   [facility] [place], Coralie Keens, NP   10 months ago Viral URI with cough   [facility] [place], Coralie Keens, NP      Future Appointments            In 2 weeks Garnette Gunner, Coralie Keens, NP [facility], [place]   In 6 months Brendolyn Patty, MD [place]

## 2022-10-09 ENCOUNTER — Encounter: Payer: Self-pay | Admitting: Internal Medicine

## 2022-10-11 ENCOUNTER — Ambulatory Visit (INDEPENDENT_AMBULATORY_CARE_PROVIDER_SITE_OTHER): Payer: Medicare Other | Admitting: Internal Medicine

## 2022-10-11 ENCOUNTER — Encounter: Payer: Self-pay | Admitting: Internal Medicine

## 2022-10-11 VITALS — BP 124/80 | HR 74 | Temp 96.8°F | Wt 134.0 lb

## 2022-10-11 DIAGNOSIS — I10 Essential (primary) hypertension: Secondary | ICD-10-CM | POA: Diagnosis not present

## 2022-10-11 DIAGNOSIS — Z86711 Personal history of pulmonary embolism: Secondary | ICD-10-CM | POA: Diagnosis not present

## 2022-10-11 DIAGNOSIS — I6523 Occlusion and stenosis of bilateral carotid arteries: Secondary | ICD-10-CM

## 2022-10-11 DIAGNOSIS — G459 Transient cerebral ischemic attack, unspecified: Secondary | ICD-10-CM

## 2022-10-11 DIAGNOSIS — M810 Age-related osteoporosis without current pathological fracture: Secondary | ICD-10-CM | POA: Diagnosis not present

## 2022-10-11 DIAGNOSIS — R32 Unspecified urinary incontinence: Secondary | ICD-10-CM | POA: Insufficient documentation

## 2022-10-11 DIAGNOSIS — Z853 Personal history of malignant neoplasm of breast: Secondary | ICD-10-CM | POA: Insufficient documentation

## 2022-10-11 DIAGNOSIS — Z86718 Personal history of other venous thrombosis and embolism: Secondary | ICD-10-CM | POA: Diagnosis not present

## 2022-10-11 DIAGNOSIS — N3946 Mixed incontinence: Secondary | ICD-10-CM | POA: Diagnosis not present

## 2022-10-11 DIAGNOSIS — E7800 Pure hypercholesterolemia, unspecified: Secondary | ICD-10-CM | POA: Diagnosis not present

## 2022-10-11 NOTE — Patient Instructions (Signed)

## 2022-10-11 NOTE — Assessment & Plan Note (Signed)
Continue Letrozole

## 2022-10-11 NOTE — Assessment & Plan Note (Signed)
Wear pads or depends as needed

## 2022-10-11 NOTE — Progress Notes (Signed)
Subjective:    Patient ID: Becky Farrell, female    DOB: 02/22/1934, 88 y.o.   MRN: [id number]  HPI  Patient presents to clinic today for follow-up of chronic conditions.  HLD with CAD, History of TIA: Her last LDL was 119, triglycerides 82, 10/2021.  She does report intermittent dizziness.  She is taking Metoprolol, Lisinopril and Aspirin as prescribed.  She does not follow with neurology.  History of DVT/PE: On lifelong Coumadin. She gets her PT/INR checked monthly.  Osteoporosis: Managed with Prolia injections.  Bone density from 08/2022 reviewed.  HTN: Her BP today is124/80.  She is taking Metoprolol and Lisinopril as prescribed.  ECG from 06/2022 reviewed.  History of Breast Cancer: In remission. She is taking Letrozole as prescribed. She follows with oncology.  Review of Systems     Past Medical History:  Diagnosis Date   Colon polyps    Family history of breast cancer    History of pulmonary embolism 2011   Hx of melanoma of skin 2003   Right leg   Hypertension    Melanoma ([place]) 2003   right lower leg. Treated in [place].   Personal history of radiation therapy    Seizures ([place]) 1993   no meds since (approx) 2014   Vertigo    x1. R/T perforated ear drum prior to 2013    Current Outpatient Medications  Medication Sig Dispense Refill   aspirin 81 MG tablet Take 81 mg by mouth daily.     Cholecalciferol (VITAMIN D3) 2000 UNITS TABS Take 2,000 Units by mouth daily.      denosumab (PROLIA) 60 MG/ML SOSY injection Inject 60 mg into the skin every 6 (six) months.     ketoconazole (NIZORAL) 2 % cream APPLY TO RASH UNDER BREASTS 2 TIMES A DAY UNTIL RASH IMPROVED. 60 g 2   letrozole (FEMARA) 2.5 MG tablet TAKE 1 TABLET BY MOUTH EVERY DAY 90 tablet 3   lisinopril (ZESTRIL) 30 MG tablet TAKE 1 TABLET BY MOUTH EVERY DAY 90 tablet 0   metoprolol tartrate (LOPRESSOR) 25 MG tablet TAKE 1 TABLET BY MOUTH TWICE A DAY 180 tablet 1   metroNIDAZOLE (METROCREAM) 0.75 % cream Apply to  nose every night for rosacea. 45 g 1   neomycin-polymyxin b-dexamethasone (MAXITROL) 3.5-10000-0.1 OINT SMARTSIG:1 sparingly In Eye(s) Every Night     polyethylene glycol (MIRALAX / GLYCOLAX) packet Take 17 g by mouth every other day.     vitamin B-12 (CYANOCOBALAMIN) 1000 MCG tablet Take 1,000 mcg by mouth daily.     warfarin (COUMADIN) 5 MG tablet TAKE ONE TABLET BY MOUTH DAILY EXCEPT TAKE 7.'5MG'$  ON MONDAYS AND THURSDAYS OR AS DIRECTED BY COUMADIN CLINIC 105 tablet 1   Wheat Dextrin (BENEFIBER PO) Take 2 each by mouth every other day.     No current facility-administered medications for this visit.    Allergies  Allergen Reactions   Clobetasol Other (See Comments)    vertigo   Clobetasol Propionate Other (See Comments)    This is ointment form UNSPECIFIED REACTION    Phenergan [Promethazine Hcl] Other (See Comments)    Rapid heart rate   Dextromethorphan Palpitations and Other (See Comments)    promethazine with DM, rapid hearbeat   Zithromax [Azithromycin] Palpitations and Other (See Comments)    Per OSH report, pt unable to recall Rapid heart rate    Family History  Problem Relation Age of Onset   Heart disease Mother    Aortic aneurysm Father  Breast cancer Daughter 27   Breast cancer Niece 62    Social History   Socioeconomic History   Marital status: Married    Spouse name: Not on file   Number of children: Not on file   Years of education: Not on file   Highest education level: Not on file  Occupational History   Not on file  Tobacco Use   Smoking status: Never   Smokeless tobacco: Never  Vaping Use   Vaping Use: Never used  Substance and Sexual Activity   Alcohol use: No   Drug use: Never   Sexual activity: Never  Other Topics Concern   Not on file  Social History Narrative   Recently moved from [place] to [facility].   Daughter and grandchildren live in [place].   Has other children in [place].   Retired Pharmacist, hospital.      Husband has been recovering  from Prostate CA.   Social Determinants of Health   Financial Resource Strain: Not on file  Food Insecurity: Not on file  Transportation Needs: Not on file  Physical Activity: Not on file  Stress: Not on file  Social Connections: Not on file  Intimate Partner Violence: Not on file     Constitutional: Denies fever, malaise, fatigue, headache or abrupt weight changes.  HEENT: Denies eye pain, eye redness, ear pain, ringing in the ears, wax buildup, runny nose, nasal congestion, bloody nose, or sore throat. Respiratory: Denies difficulty breathing, shortness of breath, cough or sputum production.   Cardiovascular: Denies chest pain, chest tightness, palpitations or swelling in the hands or feet.  Gastrointestinal: Pt reports intermittent diarrhea. Denies abdominal pain, bloating, constipation, or blood in the stool.  GU: Pt reports urinary incontinence. Denies urgency, frequency, pain with urination, burning sensation, blood in urine, odor or discharge. Musculoskeletal: Denies decrease in range of motion, difficulty with gait, muscle pain or joint pain and swelling.  Skin: Denies redness, rashes, lesions or ulcercations.  Neurological: Patient reports intermittent dizziness, difficulty with balance.  Denies difficulty with memory, difficulty with speech or problems with coordination.  Psych: Denies anxiety, depression, SI/HI.  No other specific complaints in a complete review of systems (except as listed in HPI above).  Objective:   Physical Exam  BP 124/80 (BP Location: Left Arm, Patient Position: Sitting, Cuff Size: Normal)   Pulse 74   Temp (!) 96.8 F (36 C) (Temporal)   Wt 134 lb (60.8 kg)   SpO2 98%   BMI 24.51 kg/m   Wt Readings from Last 3 Encounters:  07/23/22 130 lb (59 kg)  07/06/22 127 lb (57.6 kg)  07/01/22 129 lb (58.5 kg)    General: Appears her stated age, well developed, well nourished in NAD. Skin: Warm, dry and intact.  HEENT: Head: normal shape and  size; Eyes: sclera white, no icterus, conjunctiva pink, PERRLA and EOMs intact;  Cardiovascular: Normal rate and rhythm. S1,S2 noted.  Murmur noted. No JVD or BLE edema. No carotid bruits noted. Pulmonary/Chest: Normal effort and positive vesicular breath sounds. No respiratory distress. No wheezes, rales or ronchi noted.  Abdomen: Soft and nontender. Normal bowel sounds.  Musculoskeletal: No difficulty with gait.  Neurological: Alert and oriented. Coordination normal.    BMET    Component Value Date/Time   NA 138 07/23/2022 1305   NA 140 04/22/2013 0237   K 4.2 07/23/2022 1305   K 3.6 04/22/2013 0237   CL 105 07/23/2022 1305   CL 106 04/22/2013 0237   CO2 27  07/23/2022 1305   CO2 29 04/22/2013 0237   GLUCOSE 102 (H) 07/23/2022 1305   GLUCOSE 88 04/22/2013 0237   BUN 20 07/23/2022 1305   BUN 26 (H) 04/22/2013 0237   CREATININE 0.75 07/23/2022 1305   CREATININE 0.74 01/01/2014 1556   CALCIUM 9.5 07/23/2022 1305   CALCIUM 9.7 04/22/2013 0237   GFRNONAA >60 07/23/2022 1305   GFRNONAA >60 04/22/2013 0237   GFRAA >60 08/10/2020 1025   GFRAA >60 04/22/2013 0237    Lipid Panel     Component Value Date/Time   CHOL 215 (H) 10/09/2021 1406   TRIG 82 10/09/2021 1406   HDL 78 10/09/2021 1406   CHOLHDL 2.8 10/09/2021 1406   VLDL 16.0 04/26/2020 1503   LDLCALC 119 (H) 10/09/2021 1406    CBC    Component Value Date/Time   WBC 5.6 07/23/2022 1305   RBC 4.15 07/23/2022 1305   HGB 13.2 07/23/2022 1305   HGB 13.8 05/05/2014 1218   HCT 39.6 07/23/2022 1305   HCT 36.4 05/12/2014 0628   PLT 194 07/23/2022 1305   PLT 163 05/05/2014 1218   MCV 95.4 07/23/2022 1305   MCV 97 05/05/2014 1218   MCH 31.8 07/23/2022 1305   MCHC 33.3 07/23/2022 1305   RDW 13.1 07/23/2022 1305   RDW 13.6 05/05/2014 1218   LYMPHSABS 1.1 07/23/2022 1305   MONOABS 0.5 07/23/2022 1305   EOSABS 0.1 07/23/2022 1305   BASOSABS 0.0 07/23/2022 1305    Hgb A1C Lab Results  Component Value Date   HGBA1C  5.3 10/09/2021           Assessment & Plan:     RTC in 6 months, for your annual exam [name], NP

## 2022-10-11 NOTE — Assessment & Plan Note (Signed)
Continue Coumadin. 

## 2022-10-11 NOTE — Assessment & Plan Note (Signed)
Not on statin therapy Continue Lisinopril, Metoprolol and ASA

## 2022-10-11 NOTE — Assessment & Plan Note (Signed)
Controlled on Lisinopril and Metoprolol CMET today Reinforced DASH diet

## 2022-10-11 NOTE — Assessment & Plan Note (Signed)
CMET and lipid profile today Encouraged her to consume a low fat diet 

## 2022-10-11 NOTE — Assessment & Plan Note (Signed)
Continue Prolia injections.

## 2022-10-12 ENCOUNTER — Ambulatory Visit: Payer: Self-pay

## 2022-10-12 LAB — CBC
HCT: 37.7 % (ref 35.0–45.0)
Hemoglobin: 13.2 g/dL (ref 11.7–15.5)
MCH: 32.4 pg (ref 27.0–33.0)
MCHC: 35 g/dL (ref 32.0–36.0)
MCV: 92.6 fL (ref 80.0–100.0)
MPV: 10.6 fL (ref 7.5–12.5)
Platelets: 180 10*3/uL (ref 140–400)
RBC: 4.07 10*6/uL (ref 3.80–5.10)
RDW: 12.7 % (ref 11.0–15.0)
WBC: 4.8 10*3/uL (ref 3.8–10.8)

## 2022-10-12 LAB — LIPID PANEL
Cholesterol: 204 mg/dL — ABNORMAL HIGH (ref <200)
HDL: 80 mg/dL (ref >=50)
LDL Cholesterol (Calc): 111 mg/dL (calc) — ABNORMAL HIGH
Non-HDL Cholesterol (Calc): 124 mg/dL (calc) (ref <130)
Total CHOL/HDL Ratio: 2.6 (calc) (ref <5.0)
Triglycerides: 44 mg/dL (ref <150)

## 2022-10-12 LAB — COMPLETE METABOLIC PANEL WITH GFR
AG Ratio: 1.6 (calc) (ref 1.0–2.5)
ALT: 11 U/L (ref 6–29)
AST: 17 U/L (ref 10–35)
Albumin: 3.9 g/dL (ref 3.6–5.1)
Alkaline phosphatase (APISO): 51 U/L (ref 37–153)
BUN: 20 mg/dL (ref 7–25)
CO2: 26 mmol/L (ref 20–32)
Calcium: 9.8 mg/dL (ref 8.6–10.4)
Chloride: 106 mmol/L (ref 98–110)
Creat: 0.62 mg/dL (ref 0.60–0.95)
Globulin: 2.4 g/dL (calc) (ref 1.9–3.7)
Glucose, Bld: 94 mg/dL (ref 65–99)
Potassium: 4.5 mmol/L (ref 3.5–5.3)
Sodium: 141 mmol/L (ref 135–146)
Total Bilirubin: 0.5 mg/dL (ref 0.2–1.2)
Total Protein: 6.3 g/dL (ref 6.1–8.1)
eGFR: 87 mL/min/{1.73_m2} (ref >=60)

## 2022-10-12 NOTE — Telephone Encounter (Signed)
Called pt LMOMTCB

## 2022-10-12 NOTE — Telephone Encounter (Signed)
  Chief Complaint: medication advice Symptoms: NA Frequency: today Pertinent Negatives: NA Disposition: '[]'$ ED /'[]'$ Urgent Care (no appt availability in office) / '[]'$ Appointment(In office/virtual)/ '[]'$  [place] [facility]/ '[x]'$ Home Care/ '[]'$ Refused Recommended Disposition /'[]'$ [place] [facility]/ '[]'$  Follow-up with PCP Additional Notes: pt called back, advised it would be ok to take 7.'5mg'$  dose tonight and then resume back on normal schedule for Warfarin. Pt verbalized understanding and no further assistance needed.   Summary: Warfin advice   Pt is calling to report that she forgot to take her warfin last evening. Pt is wanting to know what she take this evening? Please advise         Reason for Disposition  Caller has medicine question only, adult not sick, AND triager answers question  Answer Assessment - Initial Assessment Questions 1. NAME of MEDICINE: "What medicine(s) are you calling about?"     Warfarin 7.'5mg'$  2. QUESTION: "What is your question?" (e.g., double dose of medicine, side effect)     Forgot to take 7.'5mg'$  dose last night, asking if can take this evening  3. PRESCRIBER: "Who prescribed the medicine?" Reason: if prescribed by specialist, call should be referred to that group.     [name], NP 4. SYMPTOMS: "Do you have any symptoms?" If Yes, ask: "What symptoms are you having?"  "How bad are the symptoms (e.g., mild, moderate, severe)     NA  Protocols used: Medication Question Call-A-AH

## 2022-10-17 DIAGNOSIS — H7200 Central perforation of tympanic membrane, unspecified ear: Secondary | ICD-10-CM | POA: Diagnosis not present

## 2022-10-17 DIAGNOSIS — H6123 Impacted cerumen, bilateral: Secondary | ICD-10-CM | POA: Diagnosis not present

## 2022-10-17 DIAGNOSIS — H90A32 Mixed conductive and sensorineural hearing loss, unilateral, left ear with restricted hearing on the contralateral side: Secondary | ICD-10-CM | POA: Diagnosis not present

## 2022-10-19 ENCOUNTER — Other Ambulatory Visit: Payer: Self-pay | Admitting: Internal Medicine

## 2022-10-19 NOTE — Telephone Encounter (Signed)
Unable to refill per protocol, request is too son. Last refill 09/25/24 for 90. Will refuse request.  Requested Prescriptions  Pending Prescriptions Disp Refills   lisinopril (ZESTRIL) 30 MG tablet [Pharmacy Med Name: LISINOPRIL 30 MG TABLET] 90 tablet 0    Sig: TAKE 1 TABLET BY MOUTH EVERY DAY     Cardiovascular:  ACE Inhibitors Passed - 10/19/2022 11:03 AM      Passed - Cr in normal range and within 180 days    Creat  Date Value Ref Range Status  10/11/2022 0.62 0.60 - 0.95 mg/dL Final         Passed - K in normal range and within 180 days    Potassium  Date Value Ref Range Status  10/11/2022 4.5 3.5 - 5.3 mmol/L Final  04/22/2013 3.6 3.5 - 5.1 mmol/L Final         Passed - Patient is not pregnant      Passed - Last BP in normal range    BP Readings from Last 1 Encounters:  10/11/22 124/80         Passed - Valid encounter within last 6 months    Recent Outpatient Visits           1 week ago Bilateral carotid artery stenosis   [place], Coralie Keens, NP   3 months ago [facility] [place], Coralie Keens, NP   9 months ago HTN, goal below 140/90   [place], Coralie Keens, NP   9 months ago Hypertensive urgency   [facility] [place], Coralie Keens, NP   11 months ago HTN, goal below 140/90   [facility] [place], Coralie Keens, NP       Future Appointments             In 5 months Brendolyn Patty, MD [place]   In 5 months [place], Coralie Keens, NP [facility], [facility]

## 2022-10-22 DIAGNOSIS — Z86718 Personal history of other venous thrombosis and embolism: Secondary | ICD-10-CM | POA: Diagnosis not present

## 2022-10-22 DIAGNOSIS — Z7901 Long term (current) use of anticoagulants: Secondary | ICD-10-CM | POA: Diagnosis not present

## 2022-10-22 DIAGNOSIS — Z86711 Personal history of pulmonary embolism: Secondary | ICD-10-CM | POA: Diagnosis not present

## 2022-10-30 ENCOUNTER — Telehealth: Payer: Self-pay

## 2022-10-30 NOTE — Telephone Encounter (Signed)
Continue current dose. Recheck PT/INR in 1 month 

## 2022-10-30 NOTE — Telephone Encounter (Signed)
Left message advising Becky Farrell.   Thanks,   -Nannette Zill  

## 2022-10-30 NOTE — Telephone Encounter (Signed)
Copied from [place] [phone]. Topic: General - Other >> Oct 22, 2022  4:05 PM Penni Bombard wrote: Reason for CRM: [facility] is calling to give lab results for patient  INR 2.0  PT 20.3  CB#  [phone] >> Oct 29, 2022 11:08 AM Penni Bombard wrote: Sydnee Cabal called back from [facility] wanting to know what dose of medication he should give and when do they need to draw labs again.  CB@  [phone]

## 2022-11-01 ENCOUNTER — Encounter: Payer: Self-pay | Admitting: Podiatry

## 2022-11-01 ENCOUNTER — Ambulatory Visit (INDEPENDENT_AMBULATORY_CARE_PROVIDER_SITE_OTHER): Payer: Medicare Other | Admitting: Podiatry

## 2022-11-01 DIAGNOSIS — B351 Tinea unguium: Secondary | ICD-10-CM

## 2022-11-01 DIAGNOSIS — D689 Coagulation defect, unspecified: Secondary | ICD-10-CM

## 2022-11-01 DIAGNOSIS — M79609 Pain in unspecified limb: Secondary | ICD-10-CM

## 2022-11-01 NOTE — Progress Notes (Signed)
This patient returns to my office for at risk foot care.  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  This patient is unable to cut nails herself since the patient cannot reach her nails.These nails are painful walking and wearing shoes.  This patient presents for at risk foot care today.  Patient was given ketoconazole by dermatologist and she see great improvement in her feet.  General Appearance  Alert, conversant and in no acute stress.  Vascular  Dorsalis pedis and posterior tibial  pulses are palpable  bilaterally.  Capillary return is within normal limits  bilaterally. Temperature is within normal limits  bilaterally.  Neurologic  Senn-Weinstein monofilament wire test within normal limits  bilaterally. Muscle power within normal limits bilaterally.  Nails Thick disfigured discolored nails with subungual debris  from hallux to fifth toes bilaterally. No evidence of bacterial infection or drainage bilaterally.  Orthopedic  No limitations of motion  feet .  No crepitus or effusions noted.  No bony pathology or digital deformities noted.  Skin  normotropic skin with no porokeratosis noted bilaterally.  No signs of infections or ulcers noted.     Onychomycosis  Pain in right toes  Pain in left toes  Consent was obtained for treatment procedures.   Mechanical debridement of nails 1-5  bilaterally performed with a nail nipper.  Filed with dremel without incident.    Return office visit    3 months                  Told patient to return for periodic foot care and evaluation due to potential at risk complications.   Ryszard Socarras DPM  

## 2022-11-08 ENCOUNTER — Other Ambulatory Visit: Payer: Self-pay | Admitting: Internal Medicine

## 2022-11-08 NOTE — Telephone Encounter (Signed)
Filled 09/25/22 # 90 - too early.

## 2022-11-14 ENCOUNTER — Telehealth: Payer: Self-pay | Admitting: Internal Medicine

## 2022-11-14 NOTE — Telephone Encounter (Signed)
N/A unable to leave a message for patient to call back and schedule the Medicare Annual Wellness Visit (AWV) virtually or by telephone.  Last AWV 05/08/21  Please schedule at anytime with [place].   Any questions, please call me at [phone]

## 2022-11-19 DIAGNOSIS — Z7901 Long term (current) use of anticoagulants: Secondary | ICD-10-CM | POA: Diagnosis not present

## 2022-11-19 DIAGNOSIS — D802 Selective deficiency of immunoglobulin A [IgA]: Secondary | ICD-10-CM | POA: Diagnosis not present

## 2022-11-19 DIAGNOSIS — Z86711 Personal history of pulmonary embolism: Secondary | ICD-10-CM | POA: Diagnosis not present

## 2022-11-19 DIAGNOSIS — Z86718 Personal history of other venous thrombosis and embolism: Secondary | ICD-10-CM | POA: Diagnosis not present

## 2022-11-20 ENCOUNTER — Telehealth: Payer: Self-pay | Admitting: Internal Medicine

## 2022-11-20 NOTE — Telephone Encounter (Signed)
Becky Farrell with [facility] reports INR - 2.0  PT - 20.7. Please advise dosage of medication. Contact number [phone].

## 2022-11-21 NOTE — Telephone Encounter (Signed)
Left message advising Elijah.   Thanks,   -Daijah Scrivens  

## 2022-11-21 NOTE — Telephone Encounter (Signed)
Continue current dose of Coumadin.  Recheck PT/INR in 1 month.

## 2022-11-23 ENCOUNTER — Other Ambulatory Visit: Payer: Self-pay | Admitting: Family Medicine

## 2022-11-23 DIAGNOSIS — Z7901 Long term (current) use of anticoagulants: Secondary | ICD-10-CM

## 2022-11-29 ENCOUNTER — Other Ambulatory Visit: Payer: Self-pay | Admitting: Family Medicine

## 2022-11-29 DIAGNOSIS — Z7901 Long term (current) use of anticoagulants: Secondary | ICD-10-CM

## 2022-12-12 ENCOUNTER — Other Ambulatory Visit: Payer: Self-pay | Admitting: Internal Medicine

## 2022-12-12 DIAGNOSIS — Z7901 Long term (current) use of anticoagulants: Secondary | ICD-10-CM

## 2022-12-12 NOTE — Telephone Encounter (Signed)
Unable to refill per protocol, Rx request was refilled 11/29/22 for 70 and 1 refill. Request is too soon, will refuse.  Requested Prescriptions  Pending Prescriptions Disp Refills   warfarin (COUMADIN) 5 MG tablet [Pharmacy Med Name: WARFARIN SODIUM 5 MG TABLET] 105 tablet 1    Sig: TAKE ONE TABLET BY MOUTH DAILY EXCEPT TAKE 7.'5MG'$  ON MONDAYS AND THURSDAYS OR AS DIRECTED BY COUMADIN CLINIC     Hematology:  Anticoagulants - warfarin Failed - 12/12/2022  9:18 AM      Failed - Manual Review: If patient's warfarin is managed by Anti-Coag team, route request to them. If not, route request to the provider.      Failed - INR in normal range and within 30 days    INR  Date Value Ref Range Status  07/19/2022 2.70 (A) 0.80 - 1.20 Final  05/10/2014 1.9  Final    Comment:    INR reference interval applies to patients on anticoagulant therapy. A single INR therapeutic range for coumarins is not optimal for all indications; however, the suggested range for most indications is 2.0 - 3.0. Exceptions to the INR Reference Range may include: Prosthetic heart valves, acute myocardial infarction, prevention of myocardial infarction, and combinations of aspirin and anticoagulant. The need for a higher or lower target INR must be assessed individually. Reference: The Pharmacology and Management of the Vitamin K  antagonists: the seventh ACCP Conference on Antithrombotic and Thrombolytic Therapy. IRJJO.8416 Sept:126 (3suppl): N9146842. A HCT value >55% may artifactually increase the PT.  In one study,  the increase was an average of 25%. Reference:  "Effect on Routine and Special Coagulation Testing Values of Citrate Anticoagulant Adjustment in Patients with High HCT Values." American Journal of Clinical Pathology 2006;126:400-405.          Passed - HCT in normal range and within 360 days    HCT  Date Value Ref Range Status  10/11/2022 37.7 35.0 - 45.0 % Final  05/12/2014 36.4 35.0 - 47.0 % Final          Passed - Patient is not pregnant      Passed - Valid encounter within last 3 months    Recent Outpatient Visits           2 months ago Bilateral carotid artery stenosis   [facility] [place], Coralie Keens, NP   5 months ago [facility] [place], Coralie Keens, NP   11 months ago HTN, goal below 140/90   [facility] [place], Coralie Keens, NP   11 months ago Hypertensive urgency   [facility] [place], Coralie Keens, NP   1 year ago HTN, goal below 140/90   [facility] [place], Coralie Keens, NP       Future Appointments             In 3 months Brendolyn Patty, MD [place]   In 4 months [place], Coralie Keens, NP [facility], [facility]            Signed Prescriptions Disp Refills   lisinopril (ZESTRIL) 30 MG tablet 90 tablet 0    Sig: TAKE 1 TABLET BY MOUTH EVERY DAY     Cardiovascular:  ACE Inhibitors Passed - 12/12/2022  9:18 AM      Passed - Cr in normal range and within 180 days    Creat  Date Value Ref Range Status  10/11/2022 0.62 0.60 - 0.95  mg/dL Final         Passed - K in normal range and within 180 days    Potassium  Date Value Ref Range Status  10/11/2022 4.5 3.5 - 5.3 mmol/L Final  04/22/2013 3.6 3.5 - 5.1 mmol/L Final         Passed - Patient is not pregnant      Passed - Last BP in normal range    BP Readings from Last 1 Encounters:  10/11/22 124/80         Passed - Valid encounter within last 6 months    Recent Outpatient Visits           2 months ago Bilateral carotid artery stenosis   [place], Coralie Keens, NP   5 months ago [facility] [place], Coralie Keens, NP   11 months ago HTN, goal below 140/90   [facility] [place], Coralie Keens, NP   11 months ago Hypertensive urgency   [facility] [place], Coralie Keens, NP   1 year ago HTN, goal below 140/90   [facility] [place], Coralie Keens, NP       Future Appointments             In 3 months Brendolyn Patty, MD [place]   In 4 months [place], Coralie Keens, NP [facility], [facility]

## 2022-12-12 NOTE — Telephone Encounter (Signed)
Requested Prescriptions  Pending Prescriptions Disp Refills   warfarin (COUMADIN) 5 MG tablet [Pharmacy Med Name: WARFARIN SODIUM 5 MG TABLET] 105 tablet 1    Sig: TAKE ONE TABLET BY MOUTH DAILY EXCEPT TAKE 7.'5MG'$  ON [place] OR AS DIRECTED BY COUMADIN CLINIC     Hematology:  Anticoagulants - warfarin Failed - 12/12/2022  9:18 AM      Failed - Manual Review: If patient's warfarin is managed by Anti-Coag team, route request to them. If not, route request to the provider.      Failed - INR in normal range and within 30 days    INR  Date Value Ref Range Status  07/19/2022 2.70 (A) 0.80 - 1.20 Final  05/10/2014 1.9  Final    Comment:    INR reference interval applies to patients on anticoagulant therapy. A single INR therapeutic range for coumarins is not optimal for all indications; however, the suggested range for most indications is 2.0 - 3.0. Exceptions to the INR Reference Range may include: Prosthetic heart valves, acute myocardial infarction, prevention of myocardial infarction, and combinations of aspirin and anticoagulant. The need for a higher or lower target INR must be assessed individually. Reference: The Pharmacology and Management of the Vitamin K  antagonists: the seventh ACCP Conference on Antithrombotic and Thrombolytic Therapy. OEUMP.5361 Sept:126 (3suppl): N9146842. A HCT value >55% may artifactually increase the PT.  In one study,  the increase was an average of 25%. Reference:  "Effect on Routine and Special Coagulation Testing Values of Citrate Anticoagulant Adjustment in Patients with High HCT Values." American Journal of Clinical Pathology 2006;126:400-405.          Passed - HCT in normal range and within 360 days    HCT  Date Value Ref Range Status  10/11/2022 37.7 35.0 - 45.0 % Final  05/12/2014 36.4 35.0 - 47.0 % Final         Passed - Patient is not pregnant      Passed - Valid encounter within last 3 months    Recent Outpatient Visits            2 months ago Bilateral carotid artery stenosis   [facility] [place], Coralie Keens, NP   5 months ago [facility] [place], Coralie Keens, NP   11 months ago HTN, goal below 140/90   [facility] [place], Coralie Keens, NP   11 months ago Hypertensive urgency   [facility] [place], Coralie Keens, NP   1 year ago HTN, goal below 140/90   [facility] [place], Coralie Keens, NP       Future Appointments             In 3 months Brendolyn Patty, MD [place]   In 4 months [place], Coralie Keens, NP [facility], [facility]             lisinopril (ZESTRIL) 30 MG tablet [Pharmacy Med Name: LISINOPRIL 30 MG TABLET] 90 tablet 0    Sig: TAKE 1 TABLET BY MOUTH EVERY DAY     Cardiovascular:  ACE Inhibitors Passed - 12/12/2022  9:18 AM      Passed - Cr in normal range and within 180 days    Creat  Date Value Ref Range Status  10/11/2022 0.62 0.60 - 0.95 mg/dL Final         Passed - K in normal range and within 180 days  Potassium  Date Value Ref Range Status  10/11/2022 4.5 3.5 - 5.3 mmol/L Final  04/22/2013 3.6 3.5 - 5.1 mmol/L Final         Passed - Patient is not pregnant      Passed - Last BP in normal range    BP Readings from Last 1 Encounters:  10/11/22 124/80         Passed - Valid encounter within last 6 months    Recent Outpatient Visits           2 months ago Bilateral carotid artery stenosis   [place], Coralie Keens, NP   5 months ago [facility] [place], Coralie Keens, NP   11 months ago HTN, goal below 140/90   [facility] [place], Coralie Keens, NP   11 months ago Hypertensive urgency   [facility] [place], Coralie Keens, NP   1 year ago HTN, goal below 140/90   [facility] [place], Coralie Keens, NP       Future Appointments             In 3 months Brendolyn Patty, MD [place]   In 4  months [place], Coralie Keens, NP [facility], [facility]

## 2022-12-17 DIAGNOSIS — Z86718 Personal history of other venous thrombosis and embolism: Secondary | ICD-10-CM | POA: Diagnosis not present

## 2022-12-17 DIAGNOSIS — Z86711 Personal history of pulmonary embolism: Secondary | ICD-10-CM | POA: Diagnosis not present

## 2022-12-17 DIAGNOSIS — Z7901 Long term (current) use of anticoagulants: Secondary | ICD-10-CM | POA: Diagnosis not present

## 2022-12-18 ENCOUNTER — Telehealth: Payer: Self-pay | Admitting: Internal Medicine

## 2022-12-18 NOTE — Telephone Encounter (Signed)
Elijah from [facility] is calling to give patients INR 3.2 and  PT 30.1    Elijah would like a all back from the Nurse.   [phone]

## 2022-12-19 NOTE — Telephone Encounter (Signed)
Repeat PT/INR in 2 weeks

## 2022-12-19 NOTE — Telephone Encounter (Signed)
Elijah advised.   Thanks,   -Mickel Baas

## 2022-12-20 ENCOUNTER — Telehealth: Payer: Self-pay

## 2022-12-20 DIAGNOSIS — Z7901 Long term (current) use of anticoagulants: Secondary | ICD-10-CM

## 2022-12-20 MED ORDER — WARFARIN SODIUM 7.5 MG PO TABS: ORAL_TABLET | ORAL | 1 refills | Status: DC

## 2022-12-24 NOTE — Telephone Encounter (Addendum)
[facility] called and spoke to [place], [facility] about the refill(s) warfarin 7.5 mg requested. Advised it was sent on 12/20/22 #70/1 refill(s). He says they have it and had to order the medication, so they are waiting on the shipment to be pulled off the truck that arrived today and it should be ready by noon today. He says patient will be notified when it's ready for pickup.

## 2022-12-24 NOTE — Telephone Encounter (Signed)
Pt is calling to check on the status of her medication refill. Advised that medication refills can take up to 3 business days. Please advise

## 2022-12-31 ENCOUNTER — Telehealth: Payer: Self-pay

## 2022-12-31 DIAGNOSIS — Z86718 Personal history of other venous thrombosis and embolism: Secondary | ICD-10-CM | POA: Diagnosis not present

## 2022-12-31 DIAGNOSIS — Z86711 Personal history of pulmonary embolism: Secondary | ICD-10-CM | POA: Diagnosis not present

## 2022-12-31 DIAGNOSIS — Z7901 Long term (current) use of anticoagulants: Secondary | ICD-10-CM | POA: Diagnosis not present

## 2022-12-31 NOTE — Telephone Encounter (Signed)
Left message advising Becky Farrell.   Thanks,   -Kayvon Mo  

## 2022-12-31 NOTE — Telephone Encounter (Signed)
[place] with lab results.  INR 3.2  PT31.1  Call back number is [phone]  Awaiting any updated orders for pt based on labs.  Please advise.

## 2022-12-31 NOTE — Telephone Encounter (Signed)
Decrease coumadin to 5 mg every day except 7.5 mg on Wednesday. Recheck PT/INR in 2 weeks.

## 2023-01-01 ENCOUNTER — Emergency Department: Payer: Medicare Other

## 2023-01-01 ENCOUNTER — Ambulatory Visit: Payer: Self-pay

## 2023-01-01 ENCOUNTER — Emergency Department
Admission: EM | Admit: 2023-01-01 | Discharge: 2023-01-01 | Disposition: A | Discharge status: Home / Self Care | Payer: Medicare Other | Attending: Emergency Medicine | Admitting: Emergency Medicine

## 2023-01-01 DIAGNOSIS — Z7901 Long term (current) use of anticoagulants: Secondary | ICD-10-CM | POA: Insufficient documentation

## 2023-01-01 DIAGNOSIS — M545 Low back pain, unspecified: Secondary | ICD-10-CM | POA: Insufficient documentation

## 2023-01-01 DIAGNOSIS — R9431 Abnormal electrocardiogram [ECG] [EKG]: Secondary | ICD-10-CM | POA: Diagnosis not present

## 2023-01-01 DIAGNOSIS — R55 Syncope and collapse: Secondary | ICD-10-CM | POA: Diagnosis not present

## 2023-01-01 DIAGNOSIS — I10 Essential (primary) hypertension: Secondary | ICD-10-CM | POA: Insufficient documentation

## 2023-01-01 DIAGNOSIS — R42 Dizziness and giddiness: Secondary | ICD-10-CM | POA: Insufficient documentation

## 2023-01-01 DIAGNOSIS — Z86718 Personal history of other venous thrombosis and embolism: Secondary | ICD-10-CM | POA: Diagnosis not present

## 2023-01-01 DIAGNOSIS — R531 Weakness: Secondary | ICD-10-CM | POA: Diagnosis not present

## 2023-01-01 LAB — URINALYSIS, ROUTINE W REFLEX MICROSCOPIC
Bacteria, UA: NONE SEEN
Bilirubin Urine: NEGATIVE
Glucose, UA: NEGATIVE mg/dL
Hgb urine dipstick: NEGATIVE
Ketones, ur: NEGATIVE mg/dL
Nitrite: NEGATIVE
Protein, ur: NEGATIVE mg/dL
Specific Gravity, Urine: 1.01 (ref 1.005–1.030)
pH: 5 (ref 5.0–8.0)

## 2023-01-01 LAB — CBC
HCT: 43.3 % (ref 36.0–46.0)
Hemoglobin: 14.4 g/dL (ref 12.0–15.0)
MCH: 31.4 pg (ref 26.0–34.0)
MCHC: 33.3 g/dL (ref 30.0–36.0)
MCV: 94.5 fL (ref 80.0–100.0)
Platelets: 185 10*3/uL (ref 150–400)
RBC: 4.58 MIL/uL (ref 3.87–5.11)
RDW: 13 % (ref 11.5–15.5)
WBC: 7.3 10*3/uL (ref 4.0–10.5)
nRBC: 0 % (ref 0.0–0.2)

## 2023-01-01 LAB — BASIC METABOLIC PANEL
Anion gap: 9 (ref 5–15)
BUN: 23 mg/dL (ref 8–23)
CO2: 24 mmol/L (ref 22–32)
Calcium: 10 mg/dL (ref 8.9–10.3)
Chloride: 106 mmol/L (ref 98–111)
Creatinine, Ser: 0.66 mg/dL (ref 0.44–1.00)
GFR, Estimated: >60 mL/min (ref >60)
Glucose, Bld: 96 mg/dL (ref 70–99)
Potassium: 4.2 mmol/L (ref 3.5–5.1)
Sodium: 139 mmol/L (ref 135–145)

## 2023-01-01 LAB — PROTIME-INR
INR: 3.2 — ABNORMAL HIGH (ref 0.8–1.2)
Prothrombin Time: 32.6 seconds — ABNORMAL HIGH (ref 11.4–15.2)

## 2023-01-01 NOTE — ED Provider Notes (Signed)
[facility] Provider Note    Event Date/Time   First MD Initiated Contact with Patient 01/01/23 1657     (approximate)   History   Weakness   HPI  Becky Farrell is a 89 y.o. female past medical history of pulmonary embolism, vertigo, seizures, hypertension who presents because of weakness.  Patient tells me that she woke up in the middle of the night to use the bathroom.  Then woke up again at around 7 and she had some pain in bilateral lower back region and felt somewhat lightheaded.  She no longer feels lightheaded and tells me that the back pain has been going on for several weeks.  She tells me she is concerned because she has had 4 children that may be her elasticity has changed and things may have shifted in her body.  She goes on to tell me about her bowel habits and that she has been feeling somewhat bloated after eating does pass gas and is having normal bowel movements however.  She denies constipation currently.  When I asked her what she is worried about she says that things just felt a little off today and she wanted to be checked out to see if anything in her diet needed to be optimized.  She denies headache numbness tingling weakness or vision change.  Denies chest pain shortness of breath.  Denies abdominal pain vomiting diarrhea.  Denies urinary symptoms.  Patient is on Coumadin due to history of DVT.  She was told that her INR was slightly elevated above 3 and adjustments have been made to her warfarin dosing.  Denies any bleeding.    Past Medical History:  Diagnosis Date   Colon polyps    Family history of breast cancer    History of pulmonary embolism 2011   Hx of melanoma of skin 2003   Right leg   Hypertension    Melanoma ([place]) 2003   right lower leg. [place].   Personal history of radiation therapy    Seizures ([place]) 1993   no meds since (approx) 2014   Vertigo    x1. R/T perforated ear drum prior to 2013    Patient Active  Problem List   Diagnosis Date Noted   History of breast cancer 10/11/2022   Urinary incontinence 10/11/2022   Carotid stenosis 07/06/2022   Pure hypercholesterolemia 10/13/2021   History of pulmonary embolus (PE) 05/08/2021   TIA (transient ischemic attack) 07/06/2017   Osteoporosis 01/29/2013   HTN, goal below 140/90    History of DVT (deep vein thrombosis) 05/21/2011     Physical Exam  Triage Vital Signs: ED Triage Vitals  Enc Vitals Group     BP 01/01/23 1541 (!) 201/107     Pulse Rate 01/01/23 1541 88     Resp 01/01/23 1541 17     Temp 01/01/23 1541 (!) 97.4 F (36.3 C)     Temp Source 01/01/23 1541 Oral     SpO2 01/01/23 1541 98 %     Weight 01/01/23 1542 130 lb (59 kg)     Height --      Head Circumference --      Peak Flow --      Pain Score 01/01/23 1542 0     Pain Loc --      Pain Edu? --      Excl. in [place]? --     Most recent vital signs: Vitals:   01/01/23 1730 01/01/23 1802  BP: (!) 171/88 (!) 180/93  Pulse:  74  Resp:    Temp:    SpO2:  100%     General: Awake, no distress.  CV:  Good peripheral perfusion.  No peripheral edema Resp:  Normal effort.  Lungs are clear Abd:  No distention.  Soft nontender throughout Neuro:             Awake, Alert, Oriented x 3  Aox3, nml speech  PERRL, EOMI, face symmetric, nml tongue movement  5/5 strength in the BL upper and lower extremities  Sensation grossly intact in the BL upper and lower extremities  Finger-nose-finger intact BL  Other:  No CVA tenderness, no midline lumbar tenderness   ED Results / Procedures / Treatments  Labs (all labs ordered are listed, but only abnormal results are displayed) Labs Reviewed  URINALYSIS, ROUTINE W REFLEX MICROSCOPIC - Abnormal; Notable for the following components:      Result Value   Color, Urine STRAW (*)    APPearance CLEAR (*)    Leukocytes,Ua MODERATE (*)    All other components within normal limits  PROTIME-INR - Abnormal; Notable for the following  components:   Prothrombin Time 32.6 (*)    INR 3.2 (*)    All other components within normal limits  BASIC METABOLIC PANEL  CBC     EKG  EKG interpretation performed by myself: NSR, nml axis, nml intervals, no acute ischemic changes    RADIOLOGY I reviewed and interpreted the CT scan of the brain which does not show any acute intracranial process    PROCEDURES:  Critical Care performed: No  .1-3 Lead EKG Interpretation  Performed by: Rada Hay, MD Authorized by: Rada Hay, MD     Interpretation: normal     ECG rate assessment: normal     Rhythm: sinus rhythm     Ectopy: none     Conduction: normal     The patient is on the cardiac monitor to evaluate for evidence of arrhythmia and/or significant heart rate changes.   MEDICATIONS ORDERED IN ED: Medications - No data to display   IMPRESSION / MDM / [place] / ED COURSE  I reviewed the triage vital signs and the nursing notes.                              Patient's presentation is most consistent with acute complicated illness / injury requiring diagnostic workup.  Differential diagnosis includes, but is not limited to, UTI, anemia, electrolyte abnormality, hypertensive emergency, intracranial hemorrhage  Patient is a 89 year old female presents because of somewhat unclear reasons to me.  Per triage note she is here because of weakness and dizziness.  She initially tells me that she felt somewhat off with some back pain when she woke up this morning and was somewhat lightheaded but when I ask her further about what she is worried about she tells me she is worried that her organs have shifted that she wants to know if anything needs to be optimized in her diet.  When I asked her specifically about symptoms she really has minimal she is not having any acute neurologic symptoms she has no infectious symptoms she is not having any urinary symptoms or constipation.  Does mention to me she has  had 4 children and so is worried that things could have shifted down there but she denies any vaginal pain and again is not having  any urinary symptoms.  She is alert and oriented and looks quite well for her age.  Neurologic exam is nonfocal abdomen soft nontender she did mention some back pain, she has no midline lumbar tenderness and exam is reassuring she is not bothered by this currently.  She was hypertensive on arrival tells me she has a history of whitecoat hypertension.  Pressure did downtrend but was still elevated in the ED.  CBC and BMP reassuring.  EKG obtained is also reassuring.  INR 3.2 which patient was aware of and adjustments were made to her Coumadin dose already.  Urinalysis obtained given patient's back pain which is not convincing for UTI.  Ultimately think the patient looks very well and with lack of other specific complaints today reassuring exam and labs I think she is appropriate for discharge and primary care follow-up.     FINAL CLINICAL IMPRESSION(S) / ED DIAGNOSES   Final diagnoses:  Lightheadedness     Rx / DC Orders   ED Discharge Orders     None        Note:  This document was prepared using Dragon voice recognition software and may include unintentional dictation errors.   Rada Hay, MD 01/01/23 [phone]

## 2023-01-01 NOTE — Telephone Encounter (Signed)
  Chief Complaint: Nat Christen Symptoms: above Frequency: overnight Pertinent Negatives: Patient denies  Disposition: '[x]'$ ED /'[]'$ Urgent Care (no appt availability in office) / '[]'$ Appointment(In office/virtual)/ '[]'$  [place] [facility] [facility]/ '[]'$ Home Care/ '[]'$ Refused Recommended Disposition /'[]'$ [facility]/ '[]'$  Follow-up with PCP Additional Notes: Pt reports pain over night and now left side weakness. Pt seems odd, is not answering questions, is repeating herself, and seems a bit confused. Husband will take pt to ED .    Reason for Disposition  Patient sounds very sick or weak to the triager  Answer Assessment - Initial Assessment Questions 1. SYMPTOM: "What is the main symptom you are concerned about?" (e.g., weakness, numbness)     Left sided weakness 2. ONSET: "When did this start?" (minutes, hours, days; while sleeping)     Overnight 3. LAST NORMAL: "When was the last time you (the patient) were normal (no symptoms)?"     Unsure - before bed last night 4. PATTERN "Does this come and go, or has it been constant since it started?"  "Is it present now?"      5. CARDIAC SYMPTOMS: "Have you had any of the following symptoms: chest pain, difficulty breathing, palpitations?"      6. NEUROLOGIC SYMPTOMS: "Have you had any of the following symptoms: headache, dizziness, vision loss, double vision, changes in speech, unsteady on your feet?"     PT is repeating herself and not answering questions 7. OTHER SYMPTOMS: "Do you have any other symptoms?"      8. PREGNANCY: "Is there any chance you are pregnant?" "When was your last menstrual period?"  Protocols used: Neurologic Deficit-A-AH

## 2023-01-01 NOTE — ED Triage Notes (Signed)
Pt sts that she has been having problems with weakness and dizziness. Pt sts that this is a ongoing issue that she has had and has been checked out for.

## 2023-01-01 NOTE — Discharge Instructions (Addendum)
The CAT scan of your brain and your blood work and urine sample were all reassuring.  If you have any further concerns please either return to the emergency department or follow-up with your primary care doctor.

## 2023-01-02 NOTE — Telephone Encounter (Signed)
Will review ED note

## 2023-01-03 ENCOUNTER — Encounter: Payer: Self-pay | Admitting: Internal Medicine

## 2023-01-03 ENCOUNTER — Ambulatory Visit (INDEPENDENT_AMBULATORY_CARE_PROVIDER_SITE_OTHER): Payer: Medicare Other | Admitting: Internal Medicine

## 2023-01-03 VITALS — BP 154/94 | HR 80 | Temp 96.9°F | Wt 131.0 lb

## 2023-01-03 DIAGNOSIS — R42 Dizziness and giddiness: Secondary | ICD-10-CM

## 2023-01-03 DIAGNOSIS — I10 Essential (primary) hypertension: Secondary | ICD-10-CM

## 2023-01-03 DIAGNOSIS — M545 Low back pain, unspecified: Secondary | ICD-10-CM | POA: Diagnosis not present

## 2023-01-03 DIAGNOSIS — R531 Weakness: Secondary | ICD-10-CM | POA: Diagnosis not present

## 2023-01-03 NOTE — Progress Notes (Addendum)
Subjective:    Patient ID: Becky Farrell, female    DOB: 1934-08-09, 89 y.o.   MRN: [id number]  HPI  Patient presents to clinic today for ER follow-up.  She presented to the ER 01/01/2023 with complaint of weakness and lightheadedness.  She reports she had had some persistent back pain, gassiness and bloating 2 weeks prior to ER visit.  Labs were reassuring.  Urinalysis did show some moderate leukocytes but no urine culture was sent.  ECG was unremarkable.  She was monitored in the ER without any other intervention.  She was discharged and advised to follow-up with her PCP.  Since that time, she reports the lightheadedness and weakness has improved. She still has some left side low back pain. This pain is worse with prolonged sitting and twisting. She denies any specific injury to this area. She denies any urinary or vaginal complaints.   Of note, her BP today is 152/96. She is taking Lisinopril as prescribed. She reports history of white coat hypertension. Her BP at home runs 144-315'Q systolic.  Review of Systems     Past Medical History:  Diagnosis Date   Colon polyps    Family history of breast cancer    History of pulmonary embolism 2011   Hx of melanoma of skin 2003   Right leg   Hypertension    Melanoma ([place]) 2003   right lower leg. [place] in [place].   Personal history of radiation therapy    Seizures ([place]) 1993   no meds since (approx) 2014   Vertigo    x1. R/T perforated ear drum prior to 2013    Current Outpatient Medications  Medication Sig Dispense Refill   aspirin 81 MG tablet Take 81 mg by mouth daily.     Cholecalciferol (VITAMIN D3) 2000 UNITS TABS Take 2,000 Units by mouth daily.      denosumab (PROLIA) 60 MG/ML SOSY injection Inject 60 mg into the skin every 6 (six) months.     ketoconazole (NIZORAL) 2 % cream APPLY TO RASH UNDER BREASTS 2 TIMES A DAY UNTIL RASH IMPROVED. 60 g 2   letrozole (FEMARA) 2.5 MG tablet TAKE 1 TABLET BY MOUTH EVERY DAY 90 tablet 3    lisinopril (ZESTRIL) 30 MG tablet TAKE 1 TABLET BY MOUTH EVERY DAY 90 tablet 0   metoprolol tartrate (LOPRESSOR) 25 MG tablet TAKE 1 TABLET BY MOUTH TWICE A DAY 180 tablet 1   metroNIDAZOLE (METROCREAM) 0.75 % cream Apply to nose every night for rosacea. 45 g 1   neomycin-polymyxin b-dexamethasone (MAXITROL) 3.5-10000-0.1 OINT SMARTSIG:1 sparingly In Eye(s) Every Night     polyethylene glycol (MIRALAX / GLYCOLAX) packet Take 17 g by mouth every other day.     vitamin B-12 (CYANOCOBALAMIN) 1000 MCG tablet Take 1,000 mcg by mouth daily.     warfarin (COUMADIN) 5 MG tablet TAKE ONE TABLET BY MOUTH DAILY EXCEPT TAKE 7.'5MG'$  ON MONDAYS AND THURSDAYS OR AS DIRECTED BY COUMADIN CLINIC 105 tablet 1   warfarin (COUMADIN) 7.5 MG tablet TAKE ONE TABLET, 7.'5MG'$ , BY MOUTH ON MONDAYS AND THURSDAYS OR AS DIRECTED BY ANTICOAGULATION CLINIC. 70 tablet 1   Wheat Dextrin (BENEFIBER PO) Take 2 each by mouth every other day.     No current facility-administered medications for this visit.    Allergies  Allergen Reactions   Clobetasol Other (See Comments)    vertigo   Clobetasol Propionate Other (See Comments)    This is ointment form UNSPECIFIED REACTION    Phenergan [  Promethazine Hcl] Other (See Comments)    Rapid heart rate   Dextromethorphan Palpitations and Other (See Comments)    promethazine with DM, rapid hearbeat   Zithromax [Azithromycin] Palpitations and Other (See Comments)    Per OSH report, pt unable to recall Rapid heart rate    Family History  Problem Relation Age of Onset   Heart disease Mother    Aortic aneurysm Father    Breast cancer Daughter 51   Breast cancer Niece 39    Social History   Socioeconomic History   Marital status: Married    Spouse name: Not on file   Number of children: Not on file   Years of education: Not on file   Highest education level: Not on file  Occupational History   Not on file  Tobacco Use   Smoking status: Never   Smokeless tobacco: Never   Vaping Use   Vaping Use: Never used  Substance and Sexual Activity   Alcohol use: No   Drug use: Never   Sexual activity: Never  Other Topics Concern   Not on file  Social History Narrative   Recently moved from [place] to [facility].   Daughter and grandchildren live in [place].   Has other children in [place].   Retired Pharmacist, hospital.      Husband has been recovering from Prostate CA.   Social Determinants of Health   Financial Resource Strain: Not on file  Food Insecurity: Not on file  Transportation Needs: Not on file  Physical Activity: Not on file  Stress: Not on file  Social Connections: Not on file  Intimate Partner Violence: Not on file     Constitutional: Denies fever, malaise, fatigue, headache or abrupt weight changes.  HEENT: Denies eye pain, eye redness, ear pain, ringing in the ears, wax buildup, runny nose, nasal congestion, bloody nose, or sore throat. Respiratory: Denies difficulty breathing, shortness of breath, cough or sputum production.   Cardiovascular: Denies chest pain, chest tightness, palpitations or swelling in the hands or feet.  Gastrointestinal: Denies abdominal pain, bloating, constipation, diarrhea or blood in the stool.  GU: Denies urgency, frequency, pain with urination, burning sensation, blood in urine, odor or discharge. Musculoskeletal: Pt reports left side low back pain. Denies decrease in range of motion, difficulty with gait, or joint pain and swelling.  Skin: Denies redness, rashes, lesions or ulcercations.  Neurological: Patient has difficulty with memory, intermittent lightheadedness.  Denies difficulty with speech or problems with balance and coordination.  Psych: Pt reports stress. Denies anxiety, depression, SI/HI.  No other specific complaints in a complete review of systems (except as listed in HPI above).  Objective:   Physical Exam  BP (!) 154/94 (BP Location: Left Arm, Patient Position: Sitting, Cuff Size: Normal)   Pulse 80    Temp (!) 96.9 F (36.1 C) (Temporal)   Wt 131 lb (59.4 kg)   SpO2 99%   BMI 23.96 kg/m   Wt Readings from Last 3 Encounters:  01/01/23 130 lb (59 kg)  10/11/22 134 lb (60.8 kg)  07/23/22 130 lb (59 kg)    General: Appears her stated age, well developed, well nourished in NAD. HEENT: Head: normal shape and size; Eyes: sclera white, no icterus, conjunctiva pink, PERRLA and EOMs intact;  Cardiovascular: Normal rate and rhythm. S1,S2 noted. Murmur noted.  Pulmonary/Chest: Normal effort and positive vesicular breath sounds. No respiratory distress. No wheezes, rales or ronchi noted.  Abdomen: Soft and nontender. Normal bowel sounds. No distention or  masses noted. Liver, spleen and kidneys non palpable. Musculoskeletal: Pain with palpation of the left paralumbar muscles. Gait slow and steady without device. Neurological: Alert and oriented. She is having trouble with recall, repeats herself often. Psychiatric: Mildlu anxious appearing.   BMET    Component Value Date/Time   NA 139 01/01/2023 1544   NA 140 04/22/2013 0237   K 4.2 01/01/2023 1544   K 3.6 04/22/2013 0237   CL 106 01/01/2023 1544   CL 106 04/22/2013 0237   CO2 24 01/01/2023 1544   CO2 29 04/22/2013 0237   GLUCOSE 96 01/01/2023 1544   GLUCOSE 88 04/22/2013 0237   BUN 23 01/01/2023 1544   BUN 26 (H) 04/22/2013 0237   CREATININE 0.66 01/01/2023 1544   CREATININE 0.62 10/11/2022 1429   CALCIUM 10.0 01/01/2023 1544   CALCIUM 9.7 04/22/2013 0237   GFRNONAA >60 01/01/2023 1544   GFRNONAA >60 04/22/2013 0237   GFRAA >60 08/10/2020 1025   GFRAA >60 04/22/2013 0237    Lipid Panel     Component Value Date/Time   CHOL 204 (H) 10/11/2022 1429   TRIG 44 10/11/2022 1429   HDL 80 10/11/2022 1429   CHOLHDL 2.6 10/11/2022 1429   VLDL 16.0 04/26/2020 1503   LDLCALC 111 (H) 10/11/2022 1429    CBC    Component Value Date/Time   WBC 7.3 01/01/2023 1544   RBC 4.58 01/01/2023 1544   HGB 14.4 01/01/2023 1544   HGB  13.8 05/05/2014 1218   HCT 43.3 01/01/2023 1544   HCT 36.4 05/12/2014 0628   PLT 185 01/01/2023 1544   PLT 163 05/05/2014 1218   MCV 94.5 01/01/2023 1544   MCV 97 05/05/2014 1218   MCH 31.4 01/01/2023 1544   MCHC 33.3 01/01/2023 1544   RDW 13.0 01/01/2023 1544   RDW 13.6 05/05/2014 1218   LYMPHSABS 1.1 07/23/2022 1305   MONOABS 0.5 07/23/2022 1305   EOSABS 0.1 07/23/2022 1305   BASOSABS 0.0 07/23/2022 1305    Hgb A1C Lab Results  Component Value Date   HGBA1C 5.3 10/09/2021           Assessment & Plan:   ER follow-up for HTN,  Lightheadedness, Weakness, Left Side Low Back Pain:  ER notes, labs and procedures reviewed Exam benign Encouraged adequate water intake Make position changes slowly Encourages use of icy hot or heating pad to lower back  RTC in 3 months for follow-up of chronic conditions [name], NP

## 2023-01-03 NOTE — Patient Instructions (Signed)
Back Exercises ?The following exercises strengthen the muscles that help to support the trunk (torso) and back. They also help to keep the lower back flexible. Doing these exercises can help to prevent or lessen existing low back pain. ?If you have back pain or discomfort, try doing these exercises 2-3 times each day or as told by your health care provider. ?As your pain improves, do them once each day, but increase the number of times that you repeat the steps for each exercise (do more repetitions). ?To prevent the recurrence of back pain, continue to do these exercises once each day or as told by your health care provider. ?Do exercises exactly as told by your health care provider and adjust them as directed. It is normal to feel mild stretching, pulling, tightness, or discomfort as you do these exercises, but you should stop right away if you feel sudden pain or your pain gets worse. ?Exercises ?Single knee to chest ?Repeat these steps 3-5 times for each leg: ?Lie on your back on a firm bed or the floor with your legs extended. ?Bring one knee to your chest. Your other leg should stay extended and in contact with the floor. ?Hold your knee in place by grabbing your knee or thigh with both hands and hold. ?Pull on your knee until you feel a gentle stretch in your lower back or buttocks. ?Hold the stretch for 10-30 seconds. ?Slowly release and straighten your leg. ? ?Pelvic tilt ?Repeat these steps 5-10 times: ?Lie on your back on a firm bed or the floor with your legs extended. ?Bend your knees so they are pointing toward the ceiling and your feet are flat on the floor. ?Tighten your lower abdominal muscles to press your lower back against the floor. This motion will tilt your pelvis so your tailbone points up toward the ceiling instead of pointing to your feet or the floor. ?With gentle tension and even breathing, hold this position for 5-10 seconds. ? ?Cat-cow ?Repeat these steps until your lower back becomes  more flexible: ?Get into a hands-and-knees position on a firm bed or the floor. Keep your hands under your shoulders, and keep your knees under your hips. You may place padding under your knees for comfort. ?Let your head hang down toward your chest. Contract your abdominal muscles and point your tailbone toward the floor so your lower back becomes rounded like the back of a cat. ?Hold this position for 5 seconds. ?Slowly lift your head, let your abdominal muscles relax, and point your tailbone up toward the ceiling so your back forms a sagging arch like the back of a cow. ?Hold this position for 5 seconds. ? ?Press-ups ?Repeat these steps 5-10 times: ?Lie on your abdomen (face-down) on a firm bed or the floor. ?Place your palms near your head, about shoulder-width apart. ?Keeping your back as relaxed as possible and keeping your hips on the floor, slowly straighten your arms to raise the top half of your body and lift your shoulders. Do not use your back muscles to raise your upper torso. You may adjust the placement of your hands to make yourself more comfortable. ?Hold this position for 5 seconds while you keep your back relaxed. ?Slowly return to lying flat on the floor. ? ?Bridges ?Repeat these steps 10 times: ?Lie on your back on a firm bed or the floor. ?Bend your knees so they are pointing toward the ceiling and your feet are flat on the floor. Your arms should be flat   at your sides, next to your body. ?Tighten your buttocks muscles and lift your buttocks off the floor until your waist is at almost the same height as your knees. You should feel the muscles working in your buttocks and the back of your thighs. If you do not feel these muscles, slide your feet 1-2 inches (2.5-5 cm) farther away from your buttocks. ?Hold this position for 3-5 seconds. ?Slowly lower your hips to the starting position, and allow your buttocks muscles to relax completely. ?If this exercise is too easy, try doing it with your arms  crossed over your chest. ?Abdominal crunches ?Repeat these steps 5-10 times: ?Lie on your back on a firm bed or the floor with your legs extended. ?Bend your knees so they are pointing toward the ceiling and your feet are flat on the floor. ?Cross your arms over your chest. ?Tip your chin slightly toward your chest without bending your neck. ?Tighten your abdominal muscles and slowly raise your torso high enough to lift your shoulder blades a tiny bit off the floor. Avoid raising your torso higher than that because it can put too much stress on your lower back and does not help to strengthen your abdominal muscles. ?Slowly return to your starting position. ? ?Back lifts ?Repeat these steps 5-10 times: ?Lie on your abdomen (face-down) with your arms at your sides, and rest your forehead on the floor. ?Tighten the muscles in your legs and your buttocks. ?Slowly lift your chest off the floor while you keep your hips pressed to the floor. Keep the back of your head in line with the curve in your back. Your eyes should be looking at the floor. ?Hold this position for 3-5 seconds. ?Slowly return to your starting position. ? ?Contact a health care provider if: ?Your back pain or discomfort gets much worse when you do an exercise. ?Your worsening back pain or discomfort does not lessen within 2 hours after you exercise. ?If you have any of these problems, stop doing these exercises right away. Do not do them again unless your health care provider says that you can. ?Get help right away if: ?You develop sudden, severe back pain. If this happens, stop doing the exercises right away. Do not do them again unless your health care provider says that you can. ?This information is not intended to replace advice given to you by your health care provider. Make sure you discuss any questions you have with your health care provider. ?Document Revised: 05/16/2021 Document Reviewed: 02/01/2021 ?Elsevier Patient Education ? 2023 [employer]  Inc[employer] ? ?

## 2023-01-07 ENCOUNTER — Ambulatory Visit: Payer: Self-pay | Admitting: *Deleted

## 2023-01-07 NOTE — Telephone Encounter (Signed)
  Chief Complaint: neck pain Symptoms: pain in base of neck Frequency: started last night Pertinent Negatives: Patient denies headache, fever, chest pain, difficulty breathing, neck swelling Disposition: '[]'$ ED /'[]'$ Urgent Care (no appt availability in office) / '[]'$ Appointment(In office/virtual)/ '[]'$  [facility] [facility]/ '[x]'$ Home Care/ '[]'$ Refused Recommended Disposition /'[]'$ [place] [facility]/ '[]'$  Follow-up with PCP Additional Notes: Patient has question for provider: Could her neck pain be associated with her osteoporosis? Patient is due denosumab (PROLIA) 60 MG/ML SOSY injection and she is thinking this could be reason for neck pain- could she be having pain due to injection effect "wearing off".. Patient did use moist heat and is feeling better- mild pain only. Patient would like to know PCP thoughts

## 2023-01-07 NOTE — Telephone Encounter (Signed)
Likely not related to prolia. Continue warm compress, massage and stretching.

## 2023-01-07 NOTE — Telephone Encounter (Signed)
Summary: med ?   Patient called in with a question about if the mild pain she is having in base of neck and it wanting know if that pain is associated with osteoparosis         Reason for Disposition  Preventing neck pain, questions about  Answer Assessment - Initial Assessment Questions 1. ONSET: "When did the pain begin?"      Started last night- base on upper back/neck 2. LOCATION: "Where does it hurt?"      Upper back/neck 3. PATTERN "Does the pain come and go, or has it been constant since it started?"      Used moist heat, muscle rub 4. SEVERITY: "How bad is the pain?"  (Scale 1-10; or mild, moderate, severe)   - NO PAIN (0): no pain or only slight stiffness    - MILD (1-3): doesn't interfere with normal activities    - MODERATE (4-7): interferes with normal activities or awakens from sleep    - SEVERE (8-10):  excruciating pain, unable to do any normal activities      mild 5. RADIATION: "Does the pain go anywhere else, shoot into your arms?"     no 6. CORD SYMPTOMS: "Any weakness or numbness of the arms or legs?"     no 7. CAUSE: "What do you think is causing the neck pain?"     unsure 8. NECK OVERUSE: "Any recent activities that involved turning or twisting the neck?"     no 9. OTHER SYMPTOMS: "Do you have any other symptoms?" (e.g., headache, fever, chest pain, difficulty breathing, neck swelling)     no  Protocols used: Neck Pain or Stiffness-A-AH

## 2023-01-08 ENCOUNTER — Telehealth: Payer: Self-pay

## 2023-01-08 NOTE — Telephone Encounter (Signed)
     Patient  visit on 01/01/2023  at [facility] was for Lightheadedness, weakness.  Have you been able to follow up with your primary care physician? Yes  The patient was or was not able to obtain any needed medicine or equipment. No medication prescribed.   Are there diet recommendations that you are having difficulty following? No  Patient expresses understanding of discharge instructions and education provided has no other needs at this time. Yes   [facility] Resource Care Guide   [email or web address]  ?? [phone]   Website: [email or web address]  [email or web address]

## 2023-01-08 NOTE — Telephone Encounter (Signed)
Pt advised.   Thanks,   -Zyan Coby  

## 2023-01-09 ENCOUNTER — Ambulatory Visit: Payer: Self-pay | Admitting: *Deleted

## 2023-01-09 NOTE — Telephone Encounter (Signed)
Reason for Disposition  Systolic BP  >= 540 OR Diastolic >= 086  Answer Assessment - Initial Assessment Questions 1. BLOOD PRESSURE: "What is the blood pressure?" "Did you take at least two measurements 5 minutes apart?"     180/94, P-100 I've been to the ED for high BP.      I don't want to go to the ED again. 2. ONSET: "When did you take your blood pressure?"     EMT came to her house but she doesn't want to go to the ED with the high BP.    EMT told her to call her PCP. My left leg was weak when I called before.   I did go to the ED.  They did a work up.  At the base of my neck at my shoulders is where I was pain.   3. HOW: "How did you take your blood pressure?" (e.g., automatic home BP monitor, visiting nurse)     EMT took it.  My lower back has been hurting so I put heat on it. Today I'm having incontinence of urine.    4. HISTORY: "Do you have a history of high blood pressure?"     Yes 5. MEDICINES: "Are you taking any medicines for blood pressure?" "Have you missed any doses recently?"     Yes 6. OTHER SYMPTOMS: "Do you have any symptoms?" (e.g., blurred vision, chest pain, difficulty breathing, headache, weakness)     Lower back hurts at times. 7. PREGNANCY: "Is there any chance you are pregnant?" "When was your last menstrual period?"     N/A  Protocols used: Blood Pressure - High-A-AH  Chief Complaint: EMT came to house and checked her BP it's 180/94, Pulse 100.   EMT told her to call her PCP.    Pt did not want to go to the ED. Symptoms: elevated BP.   Lower back pain that she puts heat on. Frequency: A few minutes ago Pertinent Negatives: Patient denies N/A Disposition: '[]'$ ED /'[]'$ Urgent Care (no appt availability in office) / '[]'$ Appointment(In office/virtual)/ '[]'$  [facility]/ '[]'$ Home Care/ '[]'$ Refused Recommended Disposition /'[]'$ [place] [facility]/ '[x]'$  Follow-up with PCP Additional Notes: Pt talked with me for several minutes about various things about her ED  experience, etc.    Reason she called is because the EMT told her to call her PCP.   I'm to relay her BP reading to PCP [name], NP.   I let her know I would get this reading to [facility] and someone would call her back.   Pt was agreeable to this.

## 2023-01-10 ENCOUNTER — Ambulatory Visit (INDEPENDENT_AMBULATORY_CARE_PROVIDER_SITE_OTHER): Payer: Medicare Other | Admitting: Internal Medicine

## 2023-01-10 ENCOUNTER — Encounter: Payer: Self-pay | Admitting: Internal Medicine

## 2023-01-10 VITALS — BP 126/84 | HR 91 | Temp 97.8°F | Wt 130.0 lb

## 2023-01-10 DIAGNOSIS — Z7901 Long term (current) use of anticoagulants: Secondary | ICD-10-CM

## 2023-01-10 DIAGNOSIS — I10 Essential (primary) hypertension: Secondary | ICD-10-CM | POA: Diagnosis not present

## 2023-01-10 MED ORDER — WARFARIN SODIUM 7.5 MG PO TABS: ORAL_TABLET | ORAL | 1 refills | Status: DC

## 2023-01-10 MED ORDER — WARFARIN SODIUM 5 MG PO TABS: ORAL_TABLET | ORAL | 3 refills | Status: DC

## 2023-01-10 NOTE — Progress Notes (Signed)
Subjective:    Patient ID: Becky Farrell, female    DOB: 12-01-1934, 89 y.o.   MRN: [id number]  HPI  Pt presents to the clinic today with c/o elevated blood pressure. She reports she had the EMT come take her blood pressure at home and it was 160/80. She has a history of HTN managed on Metoprolol and Lisinopril. Her BP today is 126/84. ECG from 12/2022 reviewed.  Review of Systems     Past Medical History:  Diagnosis Date   Colon polyps    Family history of breast cancer    History of pulmonary embolism 2011   Hx of melanoma of skin 2003   Right leg   Hypertension    Melanoma ([place]) 2003   right lower leg. Treated in [place].   Personal history of radiation therapy    Seizures ([place]) 1993   no meds since (approx) 2014   Vertigo    x1. R/T perforated ear drum prior to 2013    Current Outpatient Medications  Medication Sig Dispense Refill   aspirin 81 MG tablet Take 81 mg by mouth daily.     Cholecalciferol (VITAMIN D3) 2000 UNITS TABS Take 2,000 Units by mouth daily.      denosumab (PROLIA) 60 MG/ML SOSY injection Inject 60 mg into the skin every 6 (six) months.     ketoconazole (NIZORAL) 2 % cream APPLY TO RASH UNDER BREASTS 2 TIMES A DAY UNTIL RASH IMPROVED. 60 g 2   letrozole (FEMARA) 2.5 MG tablet TAKE 1 TABLET BY MOUTH EVERY DAY 90 tablet 3   lisinopril (ZESTRIL) 30 MG tablet TAKE 1 TABLET BY MOUTH EVERY DAY 90 tablet 0   metoprolol tartrate (LOPRESSOR) 25 MG tablet TAKE 1 TABLET BY MOUTH TWICE A DAY 180 tablet 1   metroNIDAZOLE (METROCREAM) 0.75 % cream Apply to nose every night for rosacea. 45 g 1   neomycin-polymyxin b-dexamethasone (MAXITROL) 3.5-10000-0.1 OINT SMARTSIG:1 sparingly In Eye(s) Every Night     polyethylene glycol (MIRALAX / GLYCOLAX) packet Take 17 g by mouth every other day.     vitamin B-12 (CYANOCOBALAMIN) 1000 MCG tablet Take 1,000 mcg by mouth daily.     warfarin (COUMADIN) 5 MG tablet TAKE ONE TABLET BY MOUTH DAILY EXCEPT TAKE 7.'5MG'$  ON MONDAYS AND  THURSDAYS OR AS DIRECTED BY COUMADIN CLINIC 105 tablet 1   warfarin (COUMADIN) 7.5 MG tablet TAKE ONE TABLET, 7.'5MG'$ , BY MOUTH ON MONDAYS AND THURSDAYS OR AS DIRECTED BY ANTICOAGULATION CLINIC. 70 tablet 1   Wheat Dextrin (BENEFIBER PO) Take 2 each by mouth every other day.     No current facility-administered medications for this visit.    Allergies  Allergen Reactions   Clobetasol Other (See Comments)    vertigo   Clobetasol Propionate Other (See Comments)    This is ointment form UNSPECIFIED REACTION    Phenergan [Promethazine Hcl] Other (See Comments)    Rapid heart rate   Dextromethorphan Palpitations and Other (See Comments)    promethazine with DM, rapid hearbeat   Zithromax [Azithromycin] Palpitations and Other (See Comments)    Per OSH report, pt unable to recall Rapid heart rate    Family History  Problem Relation Age of Onset   Heart disease Mother    Aortic aneurysm Father    Breast cancer Daughter 23   Breast cancer Niece 21    Social History   Socioeconomic History   Marital status: Married    Spouse name: Not on file   Number  of children: Not on file   Years of education: Not on file   Highest education level: Not on file  Occupational History   Not on file  Tobacco Use   Smoking status: Never   Smokeless tobacco: Never  Vaping Use   Vaping Use: Never used  Substance and Sexual Activity   Alcohol use: No   Drug use: Never   Sexual activity: Never  Other Topics Concern   Not on file  Social History Narrative   Recently moved from [place] to [facility].   Daughter and grandchildren live in [place].   Has other children in [place].   Retired Pharmacist, hospital.      Husband has been recovering from Prostate CA.   Social Determinants of Health   Financial Resource Strain: Not on file  Food Insecurity: Not on file  Transportation Needs: Not on file  Physical Activity: Not on file  Stress: Not on file  Social Connections: Not on file  Intimate Partner  Violence: Not on file     Constitutional: Denies fever, malaise, fatigue, headache or abrupt weight changes.  Respiratory: Denies difficulty breathing, shortness of breath, cough or sputum production.   Cardiovascular: Denies chest pain, chest tightness, palpitations or swelling in the hands or feet.  Musculoskeletal: Denies decrease in range of motion, difficulty with gait, muscle pain or joint pain and swelling.  Neurological: Pt reports difficulty with balance. Denies dizziness, difficulty with memory, difficulty with speech or problems with coordination.    No other specific complaints in a complete review of systems (except as listed in HPI above).  Objective:   Physical Exam  BP 126/84 (BP Location: Left Arm, Patient Position: Sitting, Cuff Size: Normal)   Pulse 91   Temp 97.8 F (36.6 C) (Temporal)   Wt 130 lb (59 kg)   SpO2 98%   BMI 23.78 kg/m   Wt Readings from Last 3 Encounters:  01/03/23 131 lb (59.4 kg)  01/01/23 130 lb (59 kg)  10/11/22 134 lb (60.8 kg)    General: Appears her stated age, well developed, well nourished in NAD. Skin: Warm, dry and intact.  HEENT: Head: normal shape and size; Eyes: sclera white, no icterus, conjunctiva pink, PERRLA and EOMs intact;  Cardiovascular: Normal rate and rhythm. S1,S2 noted.  No murmur, rubs or gallops noted. No JVD or BLE edema. Pulmonary/Chest: Normal effort and positive vesicular breath sounds. No respiratory distress. No wheezes, rales or ronchi noted.  Musculoskeletal: Gait slow and steady without device.  Neurological: Alert and oriented. She is having some difficulty with memory.   BMET    Component Value Date/Time   NA 139 01/01/2023 1544   NA 140 04/22/2013 0237   K 4.2 01/01/2023 1544   K 3.6 04/22/2013 0237   CL 106 01/01/2023 1544   CL 106 04/22/2013 0237   CO2 24 01/01/2023 1544   CO2 29 04/22/2013 0237   GLUCOSE 96 01/01/2023 1544   GLUCOSE 88 04/22/2013 0237   BUN 23 01/01/2023 1544   BUN 26  (H) 04/22/2013 0237   CREATININE 0.66 01/01/2023 1544   CREATININE 0.62 10/11/2022 1429   CALCIUM 10.0 01/01/2023 1544   CALCIUM 9.7 04/22/2013 0237   GFRNONAA >60 01/01/2023 1544   GFRNONAA >60 04/22/2013 0237   GFRAA >60 08/10/2020 1025   GFRAA >60 04/22/2013 0237    Lipid Panel     Component Value Date/Time   CHOL 204 (H) 10/11/2022 1429   TRIG 44 10/11/2022 1429   HDL 80  10/11/2022 1429   CHOLHDL 2.6 10/11/2022 1429   VLDL 16.0 04/26/2020 1503   LDLCALC 111 (H) 10/11/2022 1429    CBC    Component Value Date/Time   WBC 7.3 01/01/2023 1544   RBC 4.58 01/01/2023 1544   HGB 14.4 01/01/2023 1544   HGB 13.8 05/05/2014 1218   HCT 43.3 01/01/2023 1544   HCT 36.4 05/12/2014 0628   PLT 185 01/01/2023 1544   PLT 163 05/05/2014 1218   MCV 94.5 01/01/2023 1544   MCV 97 05/05/2014 1218   MCH 31.4 01/01/2023 1544   MCHC 33.3 01/01/2023 1544   RDW 13.0 01/01/2023 1544   RDW 13.6 05/05/2014 1218   LYMPHSABS 1.1 07/23/2022 1305   MONOABS 0.5 07/23/2022 1305   EOSABS 0.1 07/23/2022 1305   BASOSABS 0.0 07/23/2022 1305    Hgb A1C Lab Results  Component Value Date   HGBA1C 5.3 10/09/2021          Assessment & Plan:    RTC in 3 months, follow up chronic conditions [name], NP

## 2023-01-10 NOTE — Assessment & Plan Note (Signed)
Controlled on Lisinopril Will continue to monitor

## 2023-01-10 NOTE — Telephone Encounter (Signed)
Her blood pressure is labile as she has a lot of anxiety.  I would recommend that she continue to monitor now and if persistently >150/90 to let me know.

## 2023-01-10 NOTE — Patient Instructions (Signed)

## 2023-01-10 NOTE — Telephone Encounter (Signed)
Pt is coming in at 3:20pm  Thanks,   -Mickel Baas

## 2023-01-14 DIAGNOSIS — Z86711 Personal history of pulmonary embolism: Secondary | ICD-10-CM | POA: Diagnosis not present

## 2023-01-14 DIAGNOSIS — Z86718 Personal history of other venous thrombosis and embolism: Secondary | ICD-10-CM | POA: Diagnosis not present

## 2023-01-14 DIAGNOSIS — D802 Selective deficiency of immunoglobulin A [IgA]: Secondary | ICD-10-CM | POA: Diagnosis not present

## 2023-01-14 DIAGNOSIS — Z7901 Long term (current) use of anticoagulants: Secondary | ICD-10-CM | POA: Diagnosis not present

## 2023-01-15 ENCOUNTER — Other Ambulatory Visit: Payer: Self-pay

## 2023-01-15 MED ORDER — WARFARIN SODIUM 5 MG PO TABS: 5.0000 mg | ORAL_TABLET | Freq: Every day | ORAL | 3 refills | Status: DC

## 2023-01-15 NOTE — Telephone Encounter (Signed)
Copied from [place]. Topic: General - Other >> Jan 14, 2023 12:08 PM Santiya F wrote: Reason for CRM: Becky Farrell from [facility] where the patient lives is calling in to give Coumadin results for the patient.

## 2023-01-23 ENCOUNTER — Inpatient Hospital Stay: Payer: Medicare Other

## 2023-01-23 ENCOUNTER — Encounter: Payer: Self-pay | Admitting: Oncology

## 2023-01-23 ENCOUNTER — Inpatient Hospital Stay: Payer: Medicare Other | Attending: Oncology

## 2023-01-23 ENCOUNTER — Inpatient Hospital Stay (HOSPITAL_BASED_OUTPATIENT_CLINIC_OR_DEPARTMENT_OTHER): Payer: Medicare Other | Admitting: Oncology

## 2023-01-23 VITALS — BP 181/96 | HR 78 | Temp 97.3°F | Wt 131.8 lb

## 2023-01-23 DIAGNOSIS — Z85820 Personal history of malignant melanoma of skin: Secondary | ICD-10-CM | POA: Diagnosis not present

## 2023-01-23 DIAGNOSIS — Z1231 Encounter for screening mammogram for malignant neoplasm of breast: Secondary | ICD-10-CM | POA: Diagnosis not present

## 2023-01-23 DIAGNOSIS — M810 Age-related osteoporosis without current pathological fracture: Secondary | ICD-10-CM

## 2023-01-23 DIAGNOSIS — Z853 Personal history of malignant neoplasm of breast: Secondary | ICD-10-CM

## 2023-01-23 DIAGNOSIS — Z86711 Personal history of pulmonary embolism: Secondary | ICD-10-CM | POA: Insufficient documentation

## 2023-01-23 DIAGNOSIS — Z170 Estrogen receptor positive status [ER+]: Secondary | ICD-10-CM

## 2023-01-23 DIAGNOSIS — Z08 Encounter for follow-up examination after completed treatment for malignant neoplasm: Secondary | ICD-10-CM | POA: Diagnosis not present

## 2023-01-23 DIAGNOSIS — Z803 Family history of malignant neoplasm of breast: Secondary | ICD-10-CM | POA: Insufficient documentation

## 2023-01-23 DIAGNOSIS — C50211 Malignant neoplasm of upper-inner quadrant of right female breast: Secondary | ICD-10-CM | POA: Diagnosis not present

## 2023-01-23 DIAGNOSIS — Z79811 Long term (current) use of aromatase inhibitors: Secondary | ICD-10-CM | POA: Insufficient documentation

## 2023-01-23 DIAGNOSIS — Z7901 Long term (current) use of anticoagulants: Secondary | ICD-10-CM | POA: Diagnosis not present

## 2023-01-23 DIAGNOSIS — Z923 Personal history of irradiation: Secondary | ICD-10-CM | POA: Diagnosis not present

## 2023-01-23 LAB — COMPREHENSIVE METABOLIC PANEL
ALT: 15 U/L (ref 0–44)
AST: 21 U/L (ref 15–41)
Albumin: 3.9 g/dL (ref 3.5–5.0)
Alkaline Phosphatase: 61 U/L (ref 38–126)
Anion gap: 10 (ref 5–15)
BUN: 21 mg/dL (ref 8–23)
CO2: 26 mmol/L (ref 22–32)
Calcium: 9.7 mg/dL (ref 8.9–10.3)
Chloride: 103 mmol/L (ref 98–111)
Creatinine, Ser: 0.82 mg/dL (ref 0.44–1.00)
GFR, Estimated: >60 mL/min (ref >60)
Glucose, Bld: 92 mg/dL (ref 70–99)
Potassium: 4.4 mmol/L (ref 3.5–5.1)
Sodium: 139 mmol/L (ref 135–145)
Total Bilirubin: 0.5 mg/dL (ref 0.3–1.2)
Total Protein: 7.5 g/dL (ref 6.5–8.1)

## 2023-01-23 LAB — CBC WITH DIFFERENTIAL/PLATELET
Abs Immature Granulocytes: 0.03 10*3/uL (ref 0.00–0.07)
Basophils Absolute: 0 10*3/uL (ref 0.0–0.1)
Basophils Relative: 0 %
Eosinophils Absolute: 0.1 10*3/uL (ref 0.0–0.5)
Eosinophils Relative: 2 %
HCT: 40.2 % (ref 36.0–46.0)
Hemoglobin: 13.2 g/dL (ref 12.0–15.0)
Immature Granulocytes: 1 %
Lymphocytes Relative: 16 %
Lymphs Abs: 0.9 10*3/uL (ref 0.7–4.0)
MCH: 31.6 pg (ref 26.0–34.0)
MCHC: 32.8 g/dL (ref 30.0–36.0)
MCV: 96.2 fL (ref 80.0–100.0)
Monocytes Absolute: 0.4 10*3/uL (ref 0.1–1.0)
Monocytes Relative: 8 %
Neutro Abs: 4.1 10*3/uL (ref 1.7–7.7)
Neutrophils Relative %: 73 %
Platelets: 274 10*3/uL (ref 150–400)
RBC: 4.18 MIL/uL (ref 3.87–5.11)
RDW: 12.8 % (ref 11.5–15.5)
WBC: 5.5 10*3/uL (ref 4.0–10.5)
nRBC: 0 % (ref 0.0–0.2)

## 2023-01-23 MED ORDER — DENOSUMAB 60 MG/ML ~~LOC~~ SOSY
60.0000 mg | PREFILLED_SYRINGE | Freq: Once | SUBCUTANEOUS | Status: AC
  Administered 2023-01-23: 60 mg via SUBCUTANEOUS
  Filled 2023-01-23: qty 1

## 2023-01-23 NOTE — Progress Notes (Signed)
Hematology/Oncology Progress note Telephone:([phone] Fax:([phone]) [phone]    REASON FOR VISIT  Becky Farrell is a 89 y.o. female presents for follow up of stage I right breast cancer and osteoporosis- on prolia  ASSESSMENT & PLAN:   Carcinoma of upper-inner quadrant of right breast in female, estrogen receptor positive ([place]) # Stage T1aNx right breast cancer [ invasive ductal carcinoma with mucinous features] -08/2018  s/p surgery and radiation Labs were reviewed and discussed with patient.   Continue letrozole 2.5 mg daily. She will finish 5 years of AI treatment in  08/2023 Continue annual mammogram.-June 2024  Osteoporosis  # Osteoporosis, chronic AI use Patient previously declines calcium supplementation.  Proceed with Prolia today.  We had another discussion today and I highly recommend patient to take calcium 1200 mg daily supplementation.   History of pulmonary embolus (PE) continue long term anticoagulation with coumadin. Follows up with coumadin clinic.   Orders Placed This Encounter  Procedures   MM 3D SCREEN BREAST BILATERAL    Standing Status:   Future    Standing Expiration Date:   01/24/2024    Order Specific Question:   Reason for Exam (SYMPTOM  OR DIAGNOSIS REQUIRED)    Answer:   hx breast cancer    Order Specific Question:   Preferred imaging location?    Answer:   [facility]   Cancer antigen 27.29    Standing Status:   Future    Standing Expiration Date:   01/24/2024   CBC with Differential ([place] Only)    Standing Status:   Future    Standing Expiration Date:   01/24/2024   CMP ([place] only)    Standing Status:   Future    Standing Expiration Date:   01/24/2024   Follow up in 6 months.  All questions were answered. The patient knows to call the clinic with any problems, questions or concerns.  Becky Server, MD, PhD [facility] Hematology Oncology 01/23/2023   PERTINENT ONCOLOGY HISTORY Patient previously followed up by  Dr.Corcoran, patient switched care to me on 07/12/21 Extensive medical record review was performed by me  stage T1aNx right breast cancer s/p lumpectomy on 10/10/2018. Pathology revealed a 0.4 cm grade I invasive ductal carcinoma with mucinous features.  There was intermediate grade DCIS.  Margins were uninvolved (1.0 cm posterior margin).  There were fibrocystic changes including apocrine metaplasia.  Tumor was ER + (100%), PR + (90%), and Her2/neu-.  Ki67 was 10%.  Pathologic stage was T1aNx.  CA27.29 was 14.1 on 09/08/2018.    12/16/2018 - 02/03/2019.She received radiation. She received 5040 cGy in 28 fractions with a scar boost of another 1400 cGy using electron beam.   06/09/2019.  Patient is started on letrozole.  #Osteoporosis 07/31/2018 DEXA revealed osteoporosis with a T-score of -3.4 in the right forearm radius and -1.9 in the right femoral neck.   08/09/2020 DEXA revealed osteoporosis with a T-score of -3.2 in the forearm radius.  Right femoral neck revealed osteopenia with a T-score of -2.0.   07/07/2019 patient started Prolia every 6 months.  #History of pulmonary embolism x2.  Initial event occurred in 2011.  Recurrent pulmonary embolism after holding anticoagulation for 1 month for planned colonoscopy.  Patient is currently on lifelong anticoagulation with Coumadin.  Managed by primary care provider.  #Family history of breast cancer-[daughter and niece].  [employer] genetic testing was negative.  #Patient has history of seizure and previously on Tegretol and is currently off.  She has  not had any recent seizure/spells she follows up with neurology Dr. Melrose Nakayama. chronic ataxia/sensory loss/difficulty walking.  Neurology note was reviewed.    ER visit on 7/30/ 2023 due to high blood pressure, headache. 07/01/2022 CT head without contrast showed chronic atrophic and ischemic changes 07/02/2022, CT angio head and neck with and without contrast negative for large vessel occlusion or other  emergent findings.  Chronic findings please refer to CT reports. 06/22/2022, MRI brain without contrast showed no acute intracranial abnormality.  Mild to moderate chronic microvascular ischemic disease. . INTERVAL HISTORY Becky Farrell is a 89 y.o. female who has above history reviewed by me today presents for follow up visit for management of history of stage I right breast cancer, osteoporosis Patient takes letrozole and overall tolerates well.  Manageable side effects. Today patient reports no breast concerns. She previously declined calcium supplementation.    Past Medical History:  Diagnosis Date   Colon polyps    Family history of breast cancer    History of pulmonary embolism 2011   Hx of melanoma of skin 2003   Right leg   Hypertension    Melanoma ([place]) 2003   right lower leg. Treated in [place].   Personal history of radiation therapy    Seizures ([place]) 1993   no meds since (approx) 2014   Vertigo    x1. R/T perforated ear drum prior to 2013    Past Surgical History:  Procedure Laterality Date   ABDOMINAL HYSTERECTOMY  05/2014   BREAST BIOPSY Right 08/20/2018   invasive mammary carcinoma/DCIS    BREAST LUMPECTOMY Right 10/10/2018   BREAST LUMPECTOMY WITH RADIOACTIVE SEED LOCALIZATION Right 10/10/2018   Procedure: BREAST LUMPECTOMY WITH RADIOACTIVE SEED LOCALIZATION;  Surgeon: Jovita Kussmaul, MD;  Location: [facility];  Service: General;  Laterality: Right;   CATARACT EXTRACTION W/PHACO Right 01/06/2018   Procedure: CATARACT EXTRACTION PHACO AND INTRAOCULAR LENS PLACEMENT ([place]) RIGHT;  Surgeon: Leandrew Koyanagi, MD;  Location: [facility];  Service: Ophthalmology;  Laterality: Right;   CATARACT EXTRACTION W/PHACO Left 01/29/2018   Procedure: CATARACT EXTRACTION PHACO AND INTRAOCULAR LENS PLACEMENT ([place]) LEFT;  Surgeon: Leandrew Koyanagi, MD;  Location: [facility];  Service: Ophthalmology;  Laterality: Left;   left hand surgery  05/2011   s/p fall   OTHER  SURGICAL HISTORY  2003   Removal of lymphoma from melonoma 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     Family History  Problem Relation Age of Onset   Heart disease Mother    Aortic aneurysm Father    Breast cancer Daughter 2   Breast cancer Niece 48    Social History:  reports that she has never smoked. She has never used smokeless tobacco. She reports that she does not drink alcohol and does not use drugs.  Patient moved from [place] to [place].  Her daughter and grandchildren live in [place].  She has other children in [place].   She is a retired Pharmacist, hospital. 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   Allergies:  Allergies  Allergen Reactions   Clobetasol Other (See Comments)    vertigo   Clobetasol Propionate Other (See Comments)    This is ointment form UNSPECIFIED REACTION    Phenergan [Promethazine Hcl] Other (See Comments)    Rapid heart rate   Dextromethorphan Palpitations and Other (See Comments)    promethazine with DM, rapid hearbeat   Zithromax [Azithromycin] Palpitations and Other (See Comments)    Per OSH report, pt unable to recall Rapid heart rate  Current Medications: Current Outpatient Medications  Medication Sig Dispense Refill   aspirin 81 MG tablet Take 81 mg by mouth daily.     Cholecalciferol (VITAMIN D3) 2000 UNITS TABS Take 2,000 Units by mouth daily.      denosumab (PROLIA) 60 MG/ML SOSY injection Inject 60 mg into the skin every 6 (six) months.     letrozole (FEMARA) 2.5 MG tablet TAKE 1 TABLET BY MOUTH EVERY DAY 90 tablet 3   lisinopril (ZESTRIL) 30 MG tablet TAKE 1 TABLET BY MOUTH EVERY DAY 90 tablet 0   metoprolol tartrate (LOPRESSOR) 25 MG tablet TAKE 1 TABLET BY MOUTH TWICE A DAY 180 tablet 1   metroNIDAZOLE (METROCREAM) 0.75 % cream Apply to nose every night for rosacea. 45 g 1   polyethylene glycol (MIRALAX / GLYCOLAX) packet Take 17 g by mouth every other day.     vitamin B-12 (CYANOCOBALAMIN) 1000 MCG tablet Take 1,000 mcg by mouth daily.     warfarin (COUMADIN)  5 MG tablet Take 1 tablet (5 mg total) by mouth daily. Take 1 tab PO daily except on Wednesday 30 tablet 3   Wheat Dextrin (BENEFIBER PO) Take 2 each by mouth every other day.     ketoconazole (NIZORAL) 2 % cream APPLY TO RASH UNDER BREASTS 2 TIMES A DAY UNTIL RASH IMPROVED. 60 g 2   No current facility-administered medications for this visit.   Review of Systems  Constitutional:  Negative for chills, diaphoresis, fever, malaise/fatigue and weight loss (up 1 lb).  HENT:  Positive for hearing loss. Negative for congestion, ear discharge, ear pain, nosebleeds, sinus pain, sore throat and tinnitus.   Eyes: Negative.  Negative for blurred vision, double vision and photophobia.  Respiratory:  Negative for cough, hemoptysis, sputum production and shortness of breath.        Recurrent PE x 2.  Cardiovascular: Negative.  Negative for chest pain, palpitations, orthopnea, leg swelling and PND.  Gastrointestinal: Negative.  Negative for abdominal pain, blood in stool, constipation, diarrhea, heartburn, melena, nausea and vomiting.  Genitourinary: Negative.  Negative for dysuria, frequency, hematuria and urgency.  Musculoskeletal:  Negative for back pain, falls, joint pain, myalgias and neck pain.       Osteoporosis.  Skin: Negative.  Negative for itching and rash.  Neurological:  Positive for tingling (hands and sometimes feet). Negative for dizziness, tremors, sensory change, speech change, focal weakness, seizures (history), weakness and headaches.       Chronic balance issues.  Endo/Heme/Allergies:  Does not bruise/bleed easily (on Coumadin).  Psychiatric/Behavioral: Negative.  Negative for depression and memory loss. The patient is not nervous/anxious and does not have insomnia.   All other systems reviewed and are negative.  Performance status (ECOG): 1  Vitals Blood pressure (!) 181/96, pulse 78, temperature (!) 97.3 F (36.3 C), temperature source Tympanic, weight 131 lb 12.8 oz (59.8 kg),  SpO2 99 %.   Physical Exam Vitals and nursing note reviewed.  Constitutional:      General: She is not in acute distress.    Appearance: She is well-developed. She is not diaphoretic.  HENT:     Head: Normocephalic and atraumatic.  Eyes:     General: No scleral icterus.    Conjunctiva/sclera: Conjunctivae normal.  Neck:     Vascular: No JVD.  Cardiovascular:     Rate and Rhythm: Normal rate.     Heart sounds: Normal heart sounds. No murmur heard. Pulmonary:     Effort: Pulmonary effort is normal. No respiratory  distress.     Breath sounds: Normal breath sounds. No wheezing or rales.  Musculoskeletal:        General: No swelling or tenderness.  Skin:    General: Skin is warm and dry.  Neurological:     Mental Status: She is alert and oriented to person, place, and time.  Psychiatric:        Mood and Affect: Mood normal.   .   Labs    Latest Ref Rng & Units 01/23/2023    1:21 PM 01/01/2023    3:44 PM 10/11/2022    2:29 PM  CBC  WBC 4.0 - 10.5 K/uL 5.5  7.3  4.8   Hemoglobin 12.0 - 15.0 g/dL 13.2  14.4  13.2   Hematocrit 36.0 - 46.0 % 40.2  43.3  37.7   Platelets 150 - 400 K/uL 274  185  180       Latest Ref Rng & Units 01/23/2023    1:21 PM 01/01/2023    3:44 PM 10/11/2022    2:29 PM  CMP  Glucose 70 - 99 mg/dL 92  96  94   BUN 8 - 23 mg/dL 21  23  20   $ Creatinine 0.44 - 1.00 mg/dL 0.82  0.66  0.62   Sodium 135 - 145 mmol/L 139  139  141   Potassium 3.5 - 5.1 mmol/L 4.4  4.2  4.5   Chloride 98 - 111 mmol/L 103  106  106   CO2 22 - 32 mmol/L 26  24  26   $ Calcium 8.9 - 10.3 mg/dL 9.7  10.0  9.8   Total Protein 6.5 - 8.1 g/dL 7.5   6.3   Total Bilirubin 0.3 - 1.2 mg/dL 0.5   0.5   Alkaline Phos 38 - 126 U/L 61     AST 15 - 41 U/L 21   17   ALT 0 - 44 U/L 15   11

## 2023-01-23 NOTE — Assessment & Plan Note (Addendum)
#   Stage T1aNx right breast cancer [ invasive ductal carcinoma with mucinous features] -08/2018  s/p surgery and radiation Labs were reviewed and discussed with patient.   Continue letrozole 2.5 mg daily. She will finish 5 years of AI treatment in  08/2023 Continue annual mammogram.-June 2024

## 2023-01-23 NOTE — Assessment & Plan Note (Signed)
continue long term anticoagulation with coumadin. Follows up with coumadin clinic.

## 2023-01-23 NOTE — Assessment & Plan Note (Addendum)
#   Osteoporosis, chronic AI use Patient previously declines calcium supplementation.  Proceed with Prolia today.  We had another discussion today and I highly recommend patient to take calcium 1200 mg daily supplementation.

## 2023-01-24 LAB — CANCER ANTIGEN 27.29: CA 27.29: 17.7 U/mL (ref 0.0–38.6)

## 2023-01-28 DIAGNOSIS — Z86711 Personal history of pulmonary embolism: Secondary | ICD-10-CM | POA: Diagnosis not present

## 2023-01-28 DIAGNOSIS — Z86718 Personal history of other venous thrombosis and embolism: Secondary | ICD-10-CM | POA: Diagnosis not present

## 2023-01-28 DIAGNOSIS — Z7901 Long term (current) use of anticoagulants: Secondary | ICD-10-CM | POA: Diagnosis not present

## 2023-01-29 ENCOUNTER — Telehealth: Payer: Self-pay

## 2023-01-29 NOTE — Telephone Encounter (Signed)
Call from [place], medical assistant at [facility] with lab results.  Pt  - 24.0 Inr - 2.4  Please advise if there are any changes to pt's medication based on these lab values. Also would like to be advised when next lab draw should be scheduled.  Phone number [phone]

## 2023-01-29 NOTE — Telephone Encounter (Signed)
Continue current dose. Recheck PT/INR in 1 month

## 2023-01-30 NOTE — Telephone Encounter (Signed)
Left message advising Becky Farrell.   Thanks,   -Mickel Baas

## 2023-01-31 ENCOUNTER — Encounter: Payer: Self-pay | Admitting: Podiatry

## 2023-01-31 ENCOUNTER — Other Ambulatory Visit: Payer: Self-pay | Admitting: Internal Medicine

## 2023-01-31 ENCOUNTER — Ambulatory Visit (INDEPENDENT_AMBULATORY_CARE_PROVIDER_SITE_OTHER): Payer: Medicare Other | Admitting: Podiatry

## 2023-01-31 VITALS — BP 200/92 | HR 75

## 2023-01-31 DIAGNOSIS — D689 Coagulation defect, unspecified: Secondary | ICD-10-CM

## 2023-01-31 DIAGNOSIS — M79609 Pain in unspecified limb: Secondary | ICD-10-CM | POA: Diagnosis not present

## 2023-01-31 DIAGNOSIS — B351 Tinea unguium: Secondary | ICD-10-CM

## 2023-01-31 NOTE — Progress Notes (Signed)
This patient returns to my office for at risk foot care.  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  This patient is unable to cut nails herself since the patient cannot reach her nails.These nails are painful walking and wearing shoes.  This patient presents for at risk foot care today.  Patient was given ketoconazole by dermatologist and she see great improvement in her feet.  General Appearance  Alert, conversant and in no acute stress.  Vascular  Dorsalis pedis and posterior tibial  pulses are palpable  bilaterally.  Capillary return is within normal limits  bilaterally. Temperature is within normal limits  bilaterally.  Neurologic  Senn-Weinstein monofilament wire test within normal limits  bilaterally. Muscle power within normal limits bilaterally.  Nails Thick disfigured discolored nails with subungual debris  from hallux to fifth toes bilaterally. No evidence of bacterial infection or drainage bilaterally.  Orthopedic  No limitations of motion  feet .  No crepitus or effusions noted.  No bony pathology or digital deformities noted.  Skin  normotropic skin with no porokeratosis noted bilaterally.  No signs of infections or ulcers noted.     Onychomycosis  Pain in right toes  Pain in left toes  Consent was obtained for treatment procedures.   Mechanical debridement of nails 1-5  bilaterally performed with a nail nipper.  Filed with dremel without incident.    Return office visit    3 months                  Told patient to return for periodic foot care and evaluation due to potential at risk complications.   Gardiner Barefoot DPM

## 2023-01-31 NOTE — Telephone Encounter (Signed)
Unable to refill per protocol, Rx request is too soon. Last refill 12/12/22 and 01/15/23.  Requested Prescriptions  Pending Prescriptions Disp Refills   lisinopril (ZESTRIL) 30 MG tablet [Pharmacy Med Name: LISINOPRIL 30 MG TABLET] 90 tablet 0    Sig: TAKE 1 TABLET BY MOUTH EVERY DAY     Cardiovascular:  ACE Inhibitors Failed - 01/31/2023  7:23 AM      Failed - Last BP in normal range    BP Readings from Last 1 Encounters:  01/23/23 (!) 181/96         Passed - Cr in normal range and within 180 days    Creat  Date Value Ref Range Status  10/11/2022 0.62 0.60 - 0.95 mg/dL Final   Creatinine, Ser  Date Value Ref Range Status  01/23/2023 0.82 0.44 - 1.00 mg/dL Final         Passed - K in normal range and within 180 days    Potassium  Date Value Ref Range Status  01/23/2023 4.4 3.5 - 5.1 mmol/L Final  04/22/2013 3.6 3.5 - 5.1 mmol/L Final         Passed - Patient is not pregnant      Passed - Valid encounter within last 6 months    Recent Outpatient Visits           3 weeks ago HTN, goal below 140/90   [facility] [place], Coralie Keens, NP   4 weeks ago [facility] [place], [place] W, NP   3 months ago Bilateral carotid artery stenosis   [facility] [place], Coralie Keens, NP   6 months ago [facility] [place], Coralie Keens, NP   1 year ago HTN, goal below 140/90   [facility] [place] [place], Coralie Keens, NP       Future Appointments             In 1 month Brendolyn Patty, MD [place]   In 2 months Baity, Coralie Keens, NP [facility]             warfarin (COUMADIN) 5 MG tablet [Pharmacy Med Name: WARFARIN SODIUM 5 MG TABLET] 105 tablet 1    Sig: TAKE ONE TABLET BY MOUTH DAILY EXCEPT TAKE 7.'5MG'$  ON [place]     Hematology:   Anticoagulants - warfarin Failed - 01/31/2023  7:23 AM      Failed - Manual Review: If patient's warfarin is managed by Anti-Coag team, route request to them. If not, route request to the provider.      Failed - INR in normal range and within 30 days    INR  Date Value Ref Range Status  01/01/2023 3.2 (H) 0.8 - 1.2 Final    Comment:    (NOTE) INR goal varies based on device and disease states. Performed at [facility], [address] [place], [place] [address]   05/10/2014 1.9  Final    Comment:    INR reference interval applies to patients on anticoagulant therapy. A single INR therapeutic range for coumarins is not optimal for all indications; however, the suggested range for most indications is 2.0 - 3.0. Exceptions to the INR Reference Range may include: Prosthetic heart valves, acute myocardial infarction, prevention of myocardial infarction, and combinations of aspirin  and anticoagulant. The need for a higher or lower target INR must be assessed individually. Reference: The Pharmacology and Management of the Vitamin K  antagonists: the seventh ACCP Conference on Antithrombotic and Thrombolytic Therapy. [id number] Sept:126 (3suppl): [id number]. A HCT value >55% may artifactually increase the PT.  In one study,  the increase was an average of 25%. Reference:  "Effect on Routine and Special Coagulation Testing Values of Citrate Anticoagulant Adjustment in Patients with High HCT Values." American Journal of Clinical Pathology 2006;126:400-405.          Passed - HCT in normal range and within 360 days    HCT  Date Value Ref Range Status  01/23/2023 40.2 36.0 - 46.0 % Final  05/12/2014 36.4 35.0 - 47.0 % Final         Passed - Patient is not pregnant      Passed - Valid encounter within last 3 months    Recent Outpatient Visits           3 weeks ago HTN, goal below 140/90   [facility] [place], Coralie Keens, NP   4 weeks ago  [facility] [place], Coralie Keens, NP   3 months ago Bilateral carotid artery stenosis   [facility] [place], Coralie Keens, NP   6 months ago [facility] [place], Coralie Keens, NP   1 year ago HTN, goal below 140/90   [facility] [place], Coralie Keens, NP       Future Appointments             In 1 month Brendolyn Patty, MD [place]   In 2 months Baity, Coralie Keens, NP [facility]

## 2023-02-15 DIAGNOSIS — H90A32 Mixed conductive and sensorineural hearing loss, unilateral, left ear with restricted hearing on the contralateral side: Secondary | ICD-10-CM | POA: Diagnosis not present

## 2023-02-15 DIAGNOSIS — H6123 Impacted cerumen, bilateral: Secondary | ICD-10-CM | POA: Diagnosis not present

## 2023-02-28 DIAGNOSIS — Z86711 Personal history of pulmonary embolism: Secondary | ICD-10-CM | POA: Diagnosis not present

## 2023-02-28 DIAGNOSIS — Z7901 Long term (current) use of anticoagulants: Secondary | ICD-10-CM | POA: Diagnosis not present

## 2023-03-09 ENCOUNTER — Other Ambulatory Visit: Payer: Self-pay | Admitting: Internal Medicine

## 2023-03-11 NOTE — Telephone Encounter (Signed)
Requested Prescriptions  Pending Prescriptions Disp Refills   lisinopril (ZESTRIL) 30 MG tablet [Pharmacy Med Name: LISINOPRIL 30 MG TABLET] 90 tablet 1    Sig: TAKE 1 TABLET BY MOUTH EVERY DAY     Cardiovascular:  ACE Inhibitors Failed - 03/09/2023  1:29 PM      Failed - Last BP in normal range    BP Readings from Last 1 Encounters:  01/31/23 (!) 200/92         Passed - Cr in normal range and within 180 days    Creat  Date Value Ref Range Status  10/11/2022 0.62 0.60 - 0.95 mg/dL Final   Creatinine, Ser  Date Value Ref Range Status  01/23/2023 0.82 0.44 - 1.00 mg/dL Final         Passed - K in normal range and within 180 days    Potassium  Date Value Ref Range Status  01/23/2023 4.4 3.5 - 5.1 mmol/L Final  04/22/2013 3.6 3.5 - 5.1 mmol/L Final         Passed - Patient is not pregnant      Passed - Valid encounter within last 6 months    Recent Outpatient Visits           2 months ago HTN, goal below 140/90   [facility] [place], Salvadore [place], NP   2 months ago Lightheadedness   [facility] [place], Salvadore Oxford, NP   5 months ago Bilateral carotid artery stenosis   [place] [facility] [place], Salvadore Oxford, NP   8 months ago Dizziness   [facility] [place], Salvadore Oxford, NP   1 year ago HTN, goal below 140/90   [facility] [place], Salvadore Oxford, NP       Future Appointments             In 2 weeks Willeen Niece, MD [facility]   In 3 weeks Baity, Salvadore Oxford, NP [place] [facility]

## 2023-03-26 ENCOUNTER — Ambulatory Visit (INDEPENDENT_AMBULATORY_CARE_PROVIDER_SITE_OTHER): Payer: Medicare Other | Admitting: Dermatology

## 2023-03-26 VITALS — BP 143/79 | HR 60

## 2023-03-26 DIAGNOSIS — D1801 Hemangioma of skin and subcutaneous tissue: Secondary | ICD-10-CM

## 2023-03-26 DIAGNOSIS — B351 Tinea unguium: Secondary | ICD-10-CM | POA: Diagnosis not present

## 2023-03-26 DIAGNOSIS — L853 Xerosis cutis: Secondary | ICD-10-CM | POA: Diagnosis not present

## 2023-03-26 DIAGNOSIS — Z1283 Encounter for screening for malignant neoplasm of skin: Secondary | ICD-10-CM | POA: Diagnosis not present

## 2023-03-26 DIAGNOSIS — L719 Rosacea, unspecified: Secondary | ICD-10-CM | POA: Diagnosis not present

## 2023-03-26 DIAGNOSIS — Z85820 Personal history of malignant melanoma of skin: Secondary | ICD-10-CM

## 2023-03-26 DIAGNOSIS — L810 Postinflammatory hyperpigmentation: Secondary | ICD-10-CM | POA: Diagnosis not present

## 2023-03-26 DIAGNOSIS — L720 Epidermal cyst: Secondary | ICD-10-CM

## 2023-03-26 DIAGNOSIS — L814 Other melanin hyperpigmentation: Secondary | ICD-10-CM

## 2023-03-26 DIAGNOSIS — L729 Follicular cyst of the skin and subcutaneous tissue, unspecified: Secondary | ICD-10-CM

## 2023-03-26 DIAGNOSIS — L304 Erythema intertrigo: Secondary | ICD-10-CM

## 2023-03-26 DIAGNOSIS — D229 Melanocytic nevi, unspecified: Secondary | ICD-10-CM | POA: Diagnosis not present

## 2023-03-26 DIAGNOSIS — L578 Other skin changes due to chronic exposure to nonionizing radiation: Secondary | ICD-10-CM

## 2023-03-26 DIAGNOSIS — L821 Other seborrheic keratosis: Secondary | ICD-10-CM

## 2023-03-26 DIAGNOSIS — I8393 Asymptomatic varicose veins of bilateral lower extremities: Secondary | ICD-10-CM

## 2023-03-26 MED ORDER — KETOCONAZOLE 2 % EX CREA: TOPICAL_CREAM | CUTANEOUS | 11 refills | Status: DC

## 2023-03-26 NOTE — Patient Instructions (Addendum)
Ketoconazole 2% Cream - Apply to feet (between toes and around toenails) every night. Ketoconazole 2% Cream - Apply under breasts once a day for rash (twice a day if flared).  Metronidazole 0.75% Cream - Apply to nose and cheeks once a day for rosacea.   Due to recent changes in healthcare laws, you may see results of your pathology and/or laboratory studies on MyChart before the doctors have had a chance to review them. We understand that in some cases there may be results that are confusing or concerning to you. Please understand that not all results are received at the same time and often the doctors may need to interpret multiple results in order to provide you with the best plan of care or course of treatment. Therefore, we ask that you please give [place] 2 business days to thoroughly review all your results before contacting the office for clarification. Should we see a critical lab result, you will be contacted sooner.   If You Need Anything After Your Visit  If you have any questions or concerns for your doctor, please call our main line at [phone] and press option 4 to reach your doctor's medical assistant. If no one answers, please leave a voicemail as directed and we will return your call as soon as possible. Messages left after 4 pm will be answered the following business day.   You may also send [place] a message via MyChart. We typically respond to MyChart messages within 1-2 business days.  For prescription refills, please ask your pharmacy to contact our office. Our fax number is [phone].  If you have an urgent issue when the clinic is closed that cannot wait until the next business day, you can page your doctor at the number below.    Please note that while we do our best to be available for urgent issues outside of office hours, we are not available 24/7.   If you have an urgent issue and are unable to reach [place], you may choose to seek medical care at your doctor's office,  retail clinic, urgent care center, or emergency room.  If you have a medical emergency, please immediately call 911 or go to the emergency department.  Pager Numbers  - Dr. Gwen Pounds: [phone]  - Dr. Neale Burly: [phone]  - Dr. Roseanne Reno: [phone]  In the event of inclement weather, please call our main line at [phone] for an update on the status of any delays or closures.  Dermatology Medication Tips: Please keep the boxes that topical medications come in in order to help keep track of the instructions about where and how to use these. Pharmacies typically print the medication instructions only on the boxes and not directly on the medication tubes.   If your medication is too expensive, please contact our office at [phone] option 4 or send [place] a message through MyChart.   We are unable to tell what your co-pay for medications will be in advance as this is different depending on your insurance coverage. However, we may be able to find a substitute medication at lower cost or fill out paperwork to get insurance to cover a needed medication.   If a prior authorization is required to get your medication covered by your insurance company, please allow [place] 1-2 business days to complete this process.  Drug prices often vary depending on where the prescription is filled and some pharmacies may offer cheaper prices.  The website www.goodrx.com contains coupons for medications through different pharmacies. The prices  here do not account for what the cost may be with help from insurance (it may be cheaper with your insurance), but the website can give you the price if you did not use any insurance.  - You can print the associated coupon and take it with your prescription to the pharmacy.  - You may also stop by our office during regular business hours and pick up a GoodRx coupon card.  - If you need your prescription sent electronically to a different pharmacy, notify our office through  [facility] or by phone at [phone] option 4.     Si Usted Necesita Algo Despus de Su Visita  Tambin puede enviarnos un mensaje a travs de Pharmacist, community. Por lo general respondemos a los mensajes de MyChart en el transcurso de 1 a 2 das hbiles.  Para renovar recetas, por favor pida a su farmacia que se ponga en contacto con nuestra oficina. Harland Dingwall de fax es [place] [phone].  Si tiene un asunto urgente cuando la clnica est cerrada y que no puede esperar hasta el siguiente da hbil, puede llamar/localizar a su doctor(a) al nmero que aparece a continuacin.   Por favor, tenga en cuenta que aunque hacemos todo lo posible para estar disponibles para asuntos urgentes fuera del horario de [place], no estamos disponibles las 24 horas del da, los 7 das de la [place].   Si tiene un problema urgente y no puede comunicarse con nosotros, puede optar por buscar atencin mdica  en el consultorio de su doctor(a), en una clnica privada, en un centro de atencin urgente o en una sala de emergencias.  Si tiene Engineering geologist, por favor llame inmediatamente al 911 o vaya a la sala de emergencias.  Nmeros de bper  - Dr. Nehemiah Massed: [phone]  - Dra. Moye: [phone]  - Dra. Nicole Kindred: [phone]  En caso de inclemencias del [place], por favor llame a Johnsie Kindred principal al [phone] para una actualizacin sobre el [place] de cualquier retraso o cierre.  Consejos para la medicacin en dermatologa: Por favor, guarde las cajas en las que vienen los medicamentos de uso tpico para ayudarle a seguir las instrucciones sobre dnde y cmo usarlos. Las farmacias generalmente imprimen las instrucciones del medicamento slo en las cajas y no directamente en los tubos del [place].   Si su medicamento es muy caro, por favor, pngase en contacto con Zigmund Daniel llamando al [phone] y presione la opcin 4 o envenos un mensaje a travs de Pharmacist, community.   No podemos  decirle cul ser su copago por los medicamentos por adelantado ya que esto es diferente dependiendo de la cobertura de su seguro. Sin embargo, es posible que podamos encontrar un medicamento sustituto a Electrical engineer un formulario para que el seguro cubra el medicamento que se considera necesario.   Si se requiere una autorizacin previa para que su compaa de seguros [place] su medicamento, por favor permtanos de 1 a 2 das hbiles para completar este proceso.  Los precios de los medicamentos varan con frecuencia dependiendo del Environmental consultant de dnde se surte la receta y alguna farmacias pueden ofrecer precios ms baratos.  El sitio web www.goodrx.com tiene cupones para medicamentos de Airline pilot. Los precios aqu no tienen en cuenta lo que podra costar con la ayuda del seguro (puede ser ms barato con su seguro), pero el sitio web puede darle el precio si no utiliz Research scientist (physical sciences).  - Puede imprimir el cupn correspondiente y llevarlo con su receta a la  farmacia.  - Tambin puede pasar por nuestra oficina durante el horario de atencin regular y Charity fundraiser una tarjeta de cupones de GoodRx.  - Si necesita que su receta se enve electrnicamente a una farmacia diferente, informe a nuestra oficina a travs de MyChart de [place] o por telfono llamando al [phone] y presione la opcin 4.

## 2023-03-26 NOTE — Progress Notes (Signed)
Follow-Up Visit   Subjective  Becky Farrell is a 89 y.o. female who presents for the following: Skin Cancer Screening and Full Body Skin Exam  The patient presents for Total-Body Skin Exam (TBSE) for skin cancer screening and mole check. The patient has spots, moles and lesions to be evaluated, some may be new or changing and the patient has concerns that these could be cancer.    The following portions of the chart were reviewed this encounter and updated as appropriate: medications, allergies, medical history  Review of Systems:  No other skin or systemic complaints except as noted in HPI or Assessment and Plan.  Objective  Well appearing patient in no apparent distress; mood and affect are within normal limits.  A full examination was performed including scalp, head, eyes, ears, nose, lips, neck, chest, axillae, abdomen, back, buttocks, bilateral upper extremities, bilateral lower extremities, hands, feet, fingers, toes, fingernails, and toenails. All findings within normal limits unless otherwise noted below.   Relevant physical exam findings are noted in the Assessment and Plan.  Left Foot - Anterior 100% Toenail dystrophy (yellow thickened nails), web spaces clear    Assessment & Plan   LENTIGINES, SEBORRHEIC KERATOSES, HEMANGIOMAS - Benign normal skin lesions - Benign-appearing - Call for any changes  MELANOCYTIC NEVI - Tan-brown and/or pink-flesh-colored symmetric macules and papules - Benign appearing on exam today - Observation - Call clinic for new or changing moles - Recommend daily use of broad spectrum spf 30+ sunscreen to sun-exposed areas.   ACTINIC DAMAGE - Chronic condition, secondary to cumulative UV/sun exposure - diffuse scaly erythematous macules with underlying dyspigmentation - Recommend daily broad spectrum sunscreen SPF 30+ to sun-exposed areas, reapply every 2 hours as needed.  - Staying in the shade or wearing long sleeves, sun glasses  (UVA+UVB protection) and wide brim hats (4-inch brim around the entire circumference of the hat) are also recommended for sun protection.  - Call for new or changing lesions.  SKIN CANCER SCREENING PERFORMED TODAY.  HISTORY OF MELANOMA - No evidence of recurrence today of the right lower leg (2003) - Recommend regular full body skin exams - Recommend daily broad spectrum sunscreen SPF 30+ to sun-exposed areas, reapply every 2 hours as needed.  - Call if any new or changing lesions are noted between office visits  EPIDERMAL INCLUSION CYST Exam: Subcutaneous nodule at right lateral eye  Benign-appearing. Exam most consistent with an epidermal inclusion cyst. Discussed that a cyst is a benign growth that can grow over time and sometimes get irritated or inflamed. Recommend observation if it is not bothersome. Discussed option of surgical excision to remove it if it is growing, symptomatic, or other changes noted. Please call for new or changing lesions so they can be evaluated.  ROSACEA Exam Mid face erythema with telangiectasias   Chronic and persistent condition with duration or expected duration over one year. Condition is symptomatic/ bothersome to patient. Not currently at goal, but improving.   Rosacea is a chronic progressive skin condition usually affecting the face of adults, causing redness and/or acne bumps. It is treatable but not curable. It sometimes affects the eyes (ocular rosacea) as well. It may respond to topical and/or systemic medication and can flare with stress, sun exposure, alcohol, exercise, topical steroids (including hydrocortisone/cortisone 10) and some foods.  Daily application of broad spectrum spf 30+ sunscreen to face is recommended to reduce flares.   Treatment Plan Continue metronidazole 0.75% cream Apply to face QD/BID for rosacea. Patient  has at home.   INTERTRIGO with PIH Exam Hyperpigmented lichenified patch of the right inframammary  Chronic and  persistent condition with duration or expected duration over one year. Condition is symptomatic/ bothersome to patient. Not currently at goal.   Intertrigo is a chronic recurrent rash that occurs in skin fold areas that may be associated with friction; heat; moisture; yeast; fungus; and bacteria.  It is exacerbated by increased movement / activity; sweating; and higher atmospheric temperature.  Treatment Plan Start Ketoconazole 2% Cream Apply under breasts once to twice daily for rash.   Xerosis - diffuse xerotic patches on the legs, back - recommend gentle, hydrating skin care - gentle skin care handout given  Varicose Veins/Spider Veins - Dilated blue, purple or red veins at the lower extremities - Reassured - Smaller vessels can be treated by sclerotherapy (a procedure to inject a medicine into the veins to make them disappear) if desired, but the treatment is not covered by insurance. Larger vessels may be covered if symptomatic and we would refer to vascular surgeon if treatment desired.   Onychomycosis Left Foot - Anterior  With Tinea Pedis (improved). Chronic and persistent condition with duration or expected duration over one year.   Continue ketoconazole 2% cream Apply to feet and between toes QHS to prevent tinea pedis.  Will not clear toenails.  Erythema intertrigo  Related Medications ketoconazole (NIZORAL) 2 % cream Apply between toes and around toenails every night.   Return in about 1 year (around 03/25/2024) for TBSE, Hx melanoma.  Documentation: I have reviewed the above documentation for accuracy and completeness, and I agree with the above.  Willeen Niece, MD

## 2023-04-01 DIAGNOSIS — Z86711 Personal history of pulmonary embolism: Secondary | ICD-10-CM | POA: Diagnosis not present

## 2023-04-01 DIAGNOSIS — Z7901 Long term (current) use of anticoagulants: Secondary | ICD-10-CM | POA: Diagnosis not present

## 2023-04-01 DIAGNOSIS — Z86718 Personal history of other venous thrombosis and embolism: Secondary | ICD-10-CM | POA: Diagnosis not present

## 2023-04-03 ENCOUNTER — Telehealth: Payer: Self-pay

## 2023-04-03 NOTE — Telephone Encounter (Signed)
Have her take 5 mg of Coumadin every day and repeat PT/INR in 2 weeks

## 2023-04-03 NOTE — Telephone Encounter (Signed)
[facility] called to report pt's PT/INR   INR 1.6 PT16.1  Contact Number: [phone]   Thanks,   -Vernona Rieger

## 2023-04-03 NOTE — Telephone Encounter (Signed)
[facility] advised.   Thanks,   -Vernona Rieger

## 2023-04-04 ENCOUNTER — Other Ambulatory Visit: Payer: Self-pay | Admitting: Internal Medicine

## 2023-04-04 ENCOUNTER — Other Ambulatory Visit: Payer: Self-pay | Admitting: Cardiovascular Disease

## 2023-04-05 ENCOUNTER — Encounter: Payer: Self-pay | Admitting: Internal Medicine

## 2023-04-05 ENCOUNTER — Ambulatory Visit (INDEPENDENT_AMBULATORY_CARE_PROVIDER_SITE_OTHER): Payer: Medicare Other | Admitting: Internal Medicine

## 2023-04-05 VITALS — BP 158/87 | HR 81 | Temp 96.6°F | Wt 130.0 lb

## 2023-04-05 DIAGNOSIS — Z86718 Personal history of other venous thrombosis and embolism: Secondary | ICD-10-CM

## 2023-04-05 DIAGNOSIS — M810 Age-related osteoporosis without current pathological fracture: Secondary | ICD-10-CM | POA: Diagnosis not present

## 2023-04-05 DIAGNOSIS — G459 Transient cerebral ischemic attack, unspecified: Secondary | ICD-10-CM | POA: Diagnosis not present

## 2023-04-05 DIAGNOSIS — G609 Hereditary and idiopathic neuropathy, unspecified: Secondary | ICD-10-CM | POA: Diagnosis not present

## 2023-04-05 DIAGNOSIS — I6523 Occlusion and stenosis of bilateral carotid arteries: Secondary | ICD-10-CM | POA: Diagnosis not present

## 2023-04-05 DIAGNOSIS — I10 Essential (primary) hypertension: Secondary | ICD-10-CM

## 2023-04-05 DIAGNOSIS — G588 Other specified mononeuropathies: Secondary | ICD-10-CM | POA: Diagnosis not present

## 2023-04-05 DIAGNOSIS — E7800 Pure hypercholesterolemia, unspecified: Secondary | ICD-10-CM

## 2023-04-05 DIAGNOSIS — Z853 Personal history of malignant neoplasm of breast: Secondary | ICD-10-CM | POA: Diagnosis not present

## 2023-04-05 DIAGNOSIS — Z86711 Personal history of pulmonary embolism: Secondary | ICD-10-CM | POA: Diagnosis not present

## 2023-04-05 DIAGNOSIS — R7309 Other abnormal glucose: Secondary | ICD-10-CM | POA: Diagnosis not present

## 2023-04-05 LAB — CBC
Hemoglobin: 13.3 g/dL (ref 11.7–15.5)
MCHC: 33.6 g/dL (ref 32.0–36.0)
Platelets: 197 10*3/uL (ref 140–400)
RDW: 13.2 % (ref 11.0–15.0)
WBC: 5.7 10*3/uL (ref 3.8–10.8)

## 2023-04-05 NOTE — Progress Notes (Signed)
Subjective:    Patient ID: Becky Farrell, female    DOB: 01-Jul-1934, 89 y.o.   MRN: [id number]  HPI  Patient presents to clinic today for follow-up of chronic conditions.  HLD with Carotid Stenosis, History of TIA: Her last LDL was 111, triglycerides 44, 10/2022.  She is not taking any cholesterol-lowering medication at this time.  She is taking Metoprolol, Lisinopril and Aspirin as prescribed.  She does not follow with neurology.  History of DVT/PE: Needs lifelong anticoagulants.  She is currently taking Coumadin as prescribed.  Her recent INR was a little low.  It will be repeated in 2 weeks.  Osteoporosis: Managed with Prolia injections.  Bone density from 9/203 reviewed.  She tries to get in some weightbearing exercise.  HTN: Her BP today is 154/86.  She is taking Metoprolol and Lisinopril as reviewed prescribed.  ECG from 12/2022 reviewed.  History of Breast Cancer: In remission.  She is taking Letrozole as prescribed.  She continues to follow with oncology.  Rosacea: Managed with Metronidazole cream.  She follows with dermatology.  Review of Systems     Past Medical History:  Diagnosis Date   Colon polyps    Family history of breast cancer    History of pulmonary embolism 2011   Hx of melanoma of skin 2003   Right leg   Hypertension    Melanoma (HCC) 2003   right lower leg. Treated in [place].   Personal history of radiation therapy    Seizures (HCC) 1993   no meds since (approx) 2014   Vertigo    x1. R/T perforated ear drum prior to 2013    Current Outpatient Medications  Medication Sig Dispense Refill   aspirin 81 MG tablet Take 81 mg by mouth daily.     Cholecalciferol (VITAMIN D3) 2000 UNITS TABS Take 2,000 Units by mouth daily.      denosumab (PROLIA) 60 MG/ML SOSY injection Inject 60 mg into the skin every 6 (six) months.     ketoconazole (NIZORAL) 2 % cream Apply between toes and around toenails every night. 60 g 11   letrozole (FEMARA) 2.5 MG tablet TAKE 1  TABLET BY MOUTH EVERY DAY 90 tablet 3   lisinopril (ZESTRIL) 30 MG tablet TAKE 1 TABLET BY MOUTH EVERY DAY 90 tablet 1   metoprolol tartrate (LOPRESSOR) 25 MG tablet TAKE 1 TABLET BY MOUTH TWICE A DAY 180 tablet 0   metroNIDAZOLE (METROCREAM) 0.75 % cream Apply to nose every night for rosacea. 45 g 1   polyethylene glycol (MIRALAX / GLYCOLAX) packet Take 17 g by mouth every other day.     vitamin B-12 (CYANOCOBALAMIN) 1000 MCG tablet Take 1,000 mcg by mouth daily.     warfarin (COUMADIN) 5 MG tablet Take 7.5 mg daily except for Wednesday.  On Wednesday take 5 mg. 105 tablet 1   Wheat Dextrin (BENEFIBER PO) Take 2 each by mouth every other day.     No current facility-administered medications for this visit.    Allergies  Allergen Reactions   Clobetasol Other (See Comments)    vertigo   Clobetasol Propionate Other (See Comments)    This is ointment form UNSPECIFIED REACTION    Phenergan [Promethazine Hcl] Other (See Comments)    Rapid heart rate   Dextromethorphan Palpitations and Other (See Comments)    promethazine with DM, rapid hearbeat   Zithromax [Azithromycin] Palpitations and Other (See Comments)    Per OSH report, pt unable to recall Rapid heart  rate    Family History  Problem Relation Age of Onset   Heart disease Mother    Aortic aneurysm Father    Breast cancer Daughter 16   Breast cancer Niece 22    Social History   Socioeconomic History   Marital status: Married    Spouse name: Not on file   Number of children: Not on file   Years of education: Not on file   Highest education level: Not on file  Occupational History   Not on file  Tobacco Use   Smoking status: Never   Smokeless tobacco: Never  Vaping Use   Vaping Use: Never used  Substance and Sexual Activity   Alcohol use: No   Drug use: Never   Sexual activity: Never  Other Topics Concern   Not on file  Social History Narrative   Recently moved from [place] to [facility].   Daughter and  grandchildren live in [place].   Has other children in [place].   Retired Runner, broadcasting/film/video.      Husband has been recovering from Prostate CA.   Social Determinants of Health   Financial Resource Strain: Not on file  Food Insecurity: Not on file  Transportation Needs: Not on file  Physical Activity: Not on file  Stress: Not on file  Social Connections: Not on file  Intimate Partner Violence: Not on file     Constitutional: Denies fever, malaise, fatigue, headache or abrupt weight changes.  HEENT: Denies eye pain, eye redness, ear pain, ringing in the ears, wax buildup, runny nose, nasal congestion, bloody nose, or sore throat. Respiratory: Denies difficulty breathing, shortness of breath, cough or sputum production.   Cardiovascular: Denies chest pain, chest tightness, palpitations or swelling in the hands or feet.  Gastrointestinal: Denies abdominal pain, bloating, constipation, diarrhea or blood in the stool.  GU: Denies urgency, frequency, pain with urination, burning sensation, blood in urine, odor or discharge. Musculoskeletal: Denies decrease in range of motion, difficulty with gait, muscle pain or joint pain and swelling.  Skin: Denies redness, rashes, lesions or ulcercations.  Neurological: Patient reports difficulty with memory, balance at times.  Denies dizziness, difficulty with speech or problems with coordination.  Psych: Denies anxiety, depression, SI/HI.  No other specific complaints in a complete review of systems (except as listed in HPI above).  Objective:   Physical Exam   BP (!) 158/87 (BP Location: Left Arm, Patient Position: Sitting, Cuff Size: Normal)   Pulse 81   Temp (!) 96.6 F (35.9 C) (Temporal)   Wt 130 lb (59 kg)   SpO2 97%   BMI 23.78 kg/m   Wt Readings from Last 3 Encounters:  01/23/23 131 lb 12.8 oz (59.8 kg)  01/10/23 130 lb (59 kg)  01/03/23 131 lb (59.4 kg)    General: Appears her stated age, well developed, well nourished in  NAD. Skin: Warm, dry and intact.  HEENT: Head: normal shape and size; Eyes: sclera white, no icterus, conjunctiva pink, PERRLA and EOMs intact;  Cardiovascular: Normal rate and rhythm. S1,S2 noted.  No murmur, rubs or gallops noted. No JVD or BLE edema. No carotid bruits noted. Pulmonary/Chest: Normal effort and positive vesicular breath sounds. No respiratory distress. No wheezes, rales or ronchi noted.  Musculoskeletal:  Neurological: Alert and oriented. She has trouble with recall. Decreased sensation to bilateral lower extremities with monofilament. Psychiatric: Mood and affect normal. Behavior is normal. Judgment and thought content normal.     BMET    Component Value Date/Time  NA 139 01/23/2023 1321   NA 140 04/22/2013 0237   K 4.4 01/23/2023 1321   K 3.6 04/22/2013 0237   CL 103 01/23/2023 1321   CL 106 04/22/2013 0237   CO2 26 01/23/2023 1321   CO2 29 04/22/2013 0237   GLUCOSE 92 01/23/2023 1321   GLUCOSE 88 04/22/2013 0237   BUN 21 01/23/2023 1321   BUN 26 (H) 04/22/2013 0237   CREATININE 0.82 01/23/2023 1321   CREATININE 0.62 10/11/2022 1429   CALCIUM 9.7 01/23/2023 1321   CALCIUM 9.7 04/22/2013 0237   GFRNONAA >60 01/23/2023 1321   GFRNONAA >60 04/22/2013 0237   GFRAA >60 08/10/2020 1025   GFRAA >60 04/22/2013 0237    Lipid Panel     Component Value Date/Time   CHOL 204 (H) 10/11/2022 1429   TRIG 44 10/11/2022 1429   HDL 80 10/11/2022 1429   CHOLHDL 2.6 10/11/2022 1429   VLDL 16.0 04/26/2020 1503   LDLCALC 111 (H) 10/11/2022 1429    CBC    Component Value Date/Time   WBC 5.5 01/23/2023 1321   RBC 4.18 01/23/2023 1321   HGB 13.2 01/23/2023 1321   HGB 13.8 05/05/2014 1218   HCT 40.2 01/23/2023 1321   HCT 36.4 05/12/2014 0628   PLT 274 01/23/2023 1321   PLT 163 05/05/2014 1218   MCV 96.2 01/23/2023 1321   MCV 97 05/05/2014 1218   MCH 31.6 01/23/2023 1321   MCHC 32.8 01/23/2023 1321   RDW 12.8 01/23/2023 1321   RDW 13.6 05/05/2014 1218    LYMPHSABS 0.9 01/23/2023 1321   MONOABS 0.4 01/23/2023 1321   EOSABS 0.1 01/23/2023 1321   BASOSABS 0.0 01/23/2023 1321    Hgb A1C Lab Results  Component Value Date   HGBA1C 5.3 10/09/2021           Assessment & Plan:     Will not schedule follow up as she is planning to transfer care to [facility] NP Nicki Reaper, NP

## 2023-04-05 NOTE — Telephone Encounter (Signed)
Requested medication (s) are due for refill today: no  Requested medication (s) are on the active medication list: yes  Last refill:  01/15/23 #30 3 RF  Future visit scheduled: today 04/05/23  Notes to clinic:  routing to office per protocol-    Requested Prescriptions  Pending Prescriptions Disp Refills   warfarin (COUMADIN) 5 MG tablet [Pharmacy Med Name: WARFARIN SODIUM 5 MG TABLET] 105 tablet 1    Sig: TAKE ONE TABLET BY MOUTH DAILY EXCEPT TAKE 7.5MG  ON MONDAYS AND THURSDAYS OR AS DIRECTED BY COUMADIN CLINIC     Hematology:  Anticoagulants - warfarin Failed - 04/04/2023 12:19 PM      Failed - Manual Review: If patient's warfarin is managed by Anti-Coag team, route request to them. If not, route request to the provider.      Failed - INR in normal range and within 30 days    INR  Date Value Ref Range Status  01/01/2023 3.2 (H) 0.8 - 1.2 Final    Comment:    (NOTE) INR goal varies based on device and disease states. Performed at [facility], [address] [place], [place] [address]   05/10/2014 1.9  Final    Comment:    INR reference interval applies to patients on anticoagulant therapy. A single INR therapeutic range for coumarins is not optimal for all indications; however, the suggested range for most indications is 2.0 - 3.0. Exceptions to the INR Reference Range may include: Prosthetic heart valves, acute myocardial infarction, prevention of myocardial infarction, and combinations of aspirin and anticoagulant. The need for a higher or lower target INR must be assessed individually. Reference: The Pharmacology and Management of the Vitamin K  antagonists: the seventh ACCP Conference on Antithrombotic and Thrombolytic Therapy. Chest.2004 Sept:126 (3suppl): L7870634. A HCT value >55% may artifactually increase the PT.  In one study,  the increase was an average of 25%. Reference:  "Effect on Routine and Special Coagulation Testing Values of Citrate  Anticoagulant Adjustment in Patients with High HCT Values." American Journal of Clinical Pathology 2006;126:400-405.          Passed - HCT in normal range and within 360 days    HCT  Date Value Ref Range Status  01/23/2023 40.2 36.0 - 46.0 % Final  05/12/2014 36.4 35.0 - 47.0 % Final         Passed - Patient is not pregnant      Passed - Valid encounter within last 3 months    Recent Outpatient Visits           2 months ago HTN, goal below 140/90   [place] [facility] Cruger, Salvadore Oxford, NP   3 months ago Lightheadedness   [place] [facility] Bowman, Salvadore Oxford, NP   5 months ago Bilateral carotid artery stenosis   [facility] Craig, Salvadore Oxford, NP   9 months ago Dizziness   [place] [facility] [place]po, Salvadore Oxford, NP   1 year ago HTN, goal below 140/90   [facility] Herreid, Salvadore Oxford, NP       Future Appointments             Today Sampson Si, Salvadore Oxford, NP [place] [facility], [facility]

## 2023-04-05 NOTE — Patient Instructions (Signed)
Neuropathic Pain Neuropathic pain is pain caused by damage to the nerves that are responsible for certain sensations in your body (sensory nerves). Neuropathic pain can make you more sensitive to pain. Even a minor sensation can feel very painful. This is usually a long-term (chronic) condition that can be difficult to treat. The type of pain differs from person to person. It may: Start suddenly (acute), or it may develop slowly and become chronic. Come and go as damaged nerves heal, or it may stay at the same level for years. Cause emotional distress, loss of sleep, and a lower quality of life. What are the causes? The most common cause of this condition is diabetes. Many other diseases and conditions can also cause neuropathic pain. Causes of neuropathic pain can be classified as: Toxic. This is caused by medicines and chemicals. The most common causes of toxic neuropathic pain is damage from medicines that kill cancer cells (chemotherapy) or alcohol abuse. Metabolic. This can be caused by: Diabetes. Lack of vitamins like B12. Traumatic. Any injury that cuts, crushes, or stretches a nerve can cause damage and pain. Compression-related. If a sensory nerve gets trapped or compressed for a long period of time, the blood supply to the nerve can be cut off. Vascular. Many blood vessel diseases can cause neuropathic pain by decreasing blood supply and oxygen to nerves. Autoimmune. This type of pain results from diseases in which the body's defense system (immune system) mistakenly attacks sensory nerves. Examples of autoimmune diseases that can cause neuropathic pain include lupus and multiple sclerosis. Infectious. Many types of viral infections can damage sensory nerves and cause pain. Shingles infection is a common cause of this type of pain. Inherited. Neuropathic pain can be a symptom of many diseases that are passed down through families (genetic). What increases the risk? You are more likely to  develop this condition if: You have diabetes. You smoke. You drink too much alcohol. You are taking certain medicines, including chemotherapy or medicines that treat immune system disorders. What are the signs or symptoms? The main symptom is pain. Neuropathic pain is often described as: Burning. Shock-like. Stinging. Hot or cold. Itching. How is this diagnosed? No single test can diagnose neuropathic pain. It is diagnosed based on: A physical exam and your symptoms. Your health care provider will ask you about your pain. You may be asked to use a pain scale to describe how bad your pain is. Tests. These may be done to see if you have a cause and location of any nerve damage. They include: Nerve conduction studies and electromyography to test how well nerve signals travel through your nerves and muscles (electrodiagnostic testing). Skin biopsy to evaluate for small fiber neuropathy. Imaging studies, such as: X-rays. CT scan. MRI. How is this treated? Treatment for neuropathic pain may change over time. You may need to try different treatment options or a combination of treatments. Some options include: Treating the underlying cause of the neuropathy, such as diabetes, kidney disease, or vitamin deficiencies. Stopping medicines that can cause neuropathy, such as chemotherapy. Medicine to relieve pain. Medicines may include: Prescription or over-the-counter pain medicine. Anti-seizure medicine. Antidepressant medicines. Pain-relieving patches or creams that are applied to painful areas of skin. A medicine to numb the area (local anesthetic), which can be injected as a nerve block. Transcutaneous nerve stimulation. This uses electrical currents to block painful nerve signals. The treatment is painless. Alternative treatments, such as: Acupuncture. Meditation. Massage. Occupational or physical therapy. Pain management programs. Counseling. Follow   these instructions at  home: Medicines  Take over-the-counter and prescription medicines only as told by your health care provider. Ask your health care provider if the medicine prescribed to you: Requires you to avoid driving or using machinery. Can cause constipation. You may need to take these actions to prevent or treat constipation: Drink enough fluid to keep your urine pale yellow. Take over-the-counter or prescription medicines. Eat foods that are high in fiber, such as beans, whole grains, and fresh fruits and vegetables. Limit foods that are high in fat and processed sugars, such as fried or sweet foods. Lifestyle  Have a good support system at home. Consider joining a chronic pain support group. Do not use any products that contain nicotine or tobacco. These products include cigarettes, chewing tobacco, and vaping devices, such as e-cigarettes. If you need help quitting, ask your health care provider. Do not drink alcohol. General instructions Learn as much as you can about your condition. Work closely with all your health care providers to find the treatment plan that works best for you. Ask your health care provider what activities are safe for you. Keep all follow-up visits. This is important. Contact a health care provider if: Your pain treatments are not working. You are having side effects from your medicines. You are struggling with tiredness (fatigue), mood changes, depression, or anxiety. Get help right away if: You have thoughts of hurting yourself. Get help right away if you feel like you may hurt yourself or others, or have thoughts about taking your own life. Go to your nearest emergency room or: Call 911. Call the National Suicide Prevention Lifeline at 1-800-273-8255 or 988. This is open 24 hours a day. Text the Crisis Text Line at 741741. Summary Neuropathic pain is pain caused by damage to the nerves that are responsible for certain sensations in your body (sensory  nerves). Neuropathic pain may come and go as damaged nerves heal, or it may stay at the same level for years. Neuropathic pain is usually a long-term condition that can be difficult to treat. Consider joining a chronic pain support group. This information is not intended to replace advice given to you by your health care provider. Make sure you discuss any questions you have with your health care provider. Document Revised: 07/17/2021 Document Reviewed: 07/17/2021 Elsevier Patient Education  2023 Elsevier Inc.  

## 2023-04-05 NOTE — Assessment & Plan Note (Signed)
Encouraged daily weightbearing exercise Continue Prolia injections

## 2023-04-05 NOTE — Assessment & Plan Note (Signed)
CMET and lipid profile today Not on statin therapy Continue Aspirin, Metoprolol and Lisinopril Encouraged low fat diet

## 2023-04-05 NOTE — Assessment & Plan Note (Signed)
Elevated today Manual repeat Continue Lisinopril and Metoprol Reinforced DASH diet

## 2023-04-05 NOTE — Assessment & Plan Note (Signed)
Lipid profile today Encouraged her to consume a low fat diet 

## 2023-04-05 NOTE — Assessment & Plan Note (Signed)
She declines switching to Eliquis or Xarelto Continue Coumadin for now

## 2023-04-05 NOTE — Assessment & Plan Note (Signed)
CMET and lipid profile today Not on statin therapy Continue Aspirin Encouraged low fat diet

## 2023-04-05 NOTE — Assessment & Plan Note (Signed)
Will check A1C, B12 and Folate

## 2023-04-05 NOTE — Assessment & Plan Note (Signed)
She declines switching to Eliquis or Xarelto Continue Coumadin for now 

## 2023-04-05 NOTE — Assessment & Plan Note (Signed)
Continue Letrozole 

## 2023-04-06 LAB — COMPLETE METABOLIC PANEL WITH GFR
AG Ratio: 1.6 (calc) (ref 1.0–2.5)
ALT: 9 U/L (ref 6–29)
AST: 15 U/L (ref 10–35)
Albumin: 4.1 g/dL (ref 3.6–5.1)
Alkaline phosphatase (APISO): 53 U/L (ref 37–153)
BUN: 16 mg/dL (ref 7–25)
CO2: 26 mmol/L (ref 20–32)
Calcium: 9.9 mg/dL (ref 8.6–10.4)
Chloride: 105 mmol/L (ref 98–110)
Creat: 0.68 mg/dL (ref 0.60–0.95)
Globulin: 2.5 g/dL (calc) (ref 1.9–3.7)
Glucose, Bld: 94 mg/dL (ref 65–139)
Potassium: 4.3 mmol/L (ref 3.5–5.3)
Sodium: 138 mmol/L (ref 135–146)
Total Bilirubin: 0.4 mg/dL (ref 0.2–1.2)
Total Protein: 6.6 g/dL (ref 6.1–8.1)
eGFR: 85 mL/min/{1.73_m2} (ref >=60)

## 2023-04-06 LAB — CBC
HCT: 39.6 % (ref 35.0–45.0)
MCH: 32 pg (ref 27.0–33.0)
MCV: 95.2 fL (ref 80.0–100.0)
MPV: 10.4 fL (ref 7.5–12.5)
RBC: 4.16 10*6/uL (ref 3.80–5.10)

## 2023-04-06 LAB — LIPID PANEL
Cholesterol: 203 mg/dL — ABNORMAL HIGH (ref <200)
HDL: 73 mg/dL (ref >=50)
LDL Cholesterol (Calc): 110 mg/dL (calc) — ABNORMAL HIGH
Non-HDL Cholesterol (Calc): 130 mg/dL (calc) — ABNORMAL HIGH (ref <130)
Total CHOL/HDL Ratio: 2.8 (calc) (ref <5.0)
Triglycerides: 97 mg/dL (ref <150)

## 2023-04-06 LAB — VITAMIN B12: Vitamin B-12: 956 pg/mL (ref 200–1100)

## 2023-04-06 LAB — FOLATE: Folate: 13.4 ng/mL

## 2023-04-06 LAB — HEMOGLOBIN A1C
Hgb A1c MFr Bld: 5.6 % of total Hgb (ref <5.7)
Mean Plasma Glucose: 114 mg/dL
eAG (mmol/L): 6.3 mmol/L

## 2023-04-09 ENCOUNTER — Telehealth: Payer: Self-pay

## 2023-04-09 NOTE — Telephone Encounter (Signed)
Opened in error

## 2023-04-11 ENCOUNTER — Encounter: Payer: Self-pay | Admitting: Nurse Practitioner

## 2023-04-11 ENCOUNTER — Ambulatory Visit: Payer: Medicare Other | Admitting: Internal Medicine

## 2023-04-11 ENCOUNTER — Ambulatory Visit: Payer: Medicare Other | Admitting: Nurse Practitioner

## 2023-04-11 VITALS — BP 138/88 | HR 67 | Temp 97.4°F | Ht 62.0 in | Wt 131.0 lb

## 2023-04-11 DIAGNOSIS — M810 Age-related osteoporosis without current pathological fracture: Secondary | ICD-10-CM | POA: Diagnosis not present

## 2023-04-11 DIAGNOSIS — N8110 Cystocele, unspecified: Secondary | ICD-10-CM | POA: Diagnosis not present

## 2023-04-11 DIAGNOSIS — I10 Essential (primary) hypertension: Secondary | ICD-10-CM

## 2023-04-11 DIAGNOSIS — Z86711 Personal history of pulmonary embolism: Secondary | ICD-10-CM | POA: Diagnosis not present

## 2023-04-11 DIAGNOSIS — Z853 Personal history of malignant neoplasm of breast: Secondary | ICD-10-CM | POA: Diagnosis not present

## 2023-04-11 DIAGNOSIS — G609 Hereditary and idiopathic neuropathy, unspecified: Secondary | ICD-10-CM

## 2023-04-11 DIAGNOSIS — K5904 Chronic idiopathic constipation: Secondary | ICD-10-CM

## 2023-04-11 NOTE — Patient Instructions (Signed)
Calcium supplement should be taken as 1 tablet twice daily with food (breakfast and supper)  To start with 1 tablet daily with breakfast.

## 2023-04-11 NOTE — Progress Notes (Signed)
Careteam: Patient Care Team: Sharon Seller, NP as PCP - General (Geriatric Medicine) Iran Ouch, MD as PCP - Cardiology (Cardiology) Carmina Miller, MD as Radiation Oncologist (Radiation Oncology) Rickard Patience, MD as Consulting Physician (Oncology)  Advanced Directive information Does Patient Have a Medical Advance Directive?: Yes, Type of Advance Directive: Healthcare Power of [place];Living will, Does patient want to make changes to medical advance directive?: No - Patient declined  Allergies  Allergen Reactions   Clobetasol Other (See Comments)    vertigo   Clobetasol Propionate Other (See Comments)    This is ointment form UNSPECIFIED REACTION    Phenergan [Promethazine Hcl] Other (See Comments)    Rapid heart rate   Dextromethorphan Palpitations and Other (See Comments)    promethazine with DM, rapid hearbeat   Zithromax [Azithromycin] Palpitations and Other (See Comments)    Per OSH report, pt unable to recall Rapid heart rate    Chief Complaint  Patient presents with   Establish Care    To establish Care   Quality Metric Gaps    To Discuss need for Zoster, Pneumonia and AWV     HPI: Patient is a 89 y.o. female seen in today at the [facility] to establish care. Reports she wanted more convenience having her primary at [facility].   Osteoporosis- on prolia with vit d- she gets her prolia through her oncologist.   Fungal infection of toenails- going to podiatrist- taking ketoconazole.   Hx of breast cancer- followed by oncologist, taking letrozole.   Constipation- recommended to take benefiber and miralax but feels like GI is doing better.   Rosacesa- using metronidazole cream at bedtime.   Vit b12 def- on supplement daily  Hx of PE- continues on coumadin. Last INR was 1.9, she will have another INR next week to follow up She has been followed by cardiologist due to AS, htn and palpitation yearly.   Review of Systems:  Review of Systems   Constitutional:  Negative for chills, fever and weight loss.  HENT:  Negative for tinnitus.   Respiratory:  Negative for cough, sputum production and shortness of breath.   Cardiovascular:  Negative for chest pain, palpitations and leg swelling.  Gastrointestinal:  Negative for abdominal pain, constipation, diarrhea and heartburn.  Genitourinary:  Negative for dysuria, frequency and urgency.  Musculoskeletal:  Negative for back pain, falls, joint pain and myalgias.  Skin: Negative.   Neurological:  Negative for dizziness and headaches.  Psychiatric/Behavioral:  Negative for depression and memory loss. The patient does not have insomnia.     Past Medical History:  Diagnosis Date   Colon polyps    Family history of breast cancer    History of pulmonary embolism 2011   Hx of melanoma of skin 2003   Right leg   Hypertension    Melanoma (HCC) 2003   right lower leg. Treated in [place].   Personal history of radiation therapy    Seizures (HCC) 1993   no meds since (approx) 2014   Vertigo    x1. R/T perforated ear drum prior to 2013   Past Surgical History:  Procedure Laterality Date   ABDOMINAL HYSTERECTOMY  05/2014   BREAST BIOPSY Right 08/20/2018   invasive mammary carcinoma/DCIS    BREAST LUMPECTOMY Right 10/10/2018   BREAST LUMPECTOMY WITH RADIOACTIVE SEED LOCALIZATION Right 10/10/2018   Procedure: BREAST LUMPECTOMY WITH RADIOACTIVE SEED LOCALIZATION;  Surgeon: Griselda Miner, MD;  Location: [facility] OR;  Service: General;  Laterality:  Right;   CATARACT EXTRACTION W/PHACO Right 01/06/2018   Procedure: CATARACT EXTRACTION PHACO AND INTRAOCULAR LENS PLACEMENT (IOC) RIGHT;  Surgeon: Lockie Mola, MD;  Location[facility];  Service: Ophthalmology;  Laterality: Right;   CATARACT EXTRACTION W/PHACO Left 01/29/2018   Procedure: CATARACT EXTRACTION PHACO AND INTRAOCULAR LENS PLACEMENT (IOC) LEFT;  Surgeon: Lockie Mola, MD;  Location: [facility];  Service:  Ophthalmology;  Laterality: Left;   left hand surgery  05/2011   s/p fall   OTHER SURGICAL HISTORY  2003   Removal of lymphoma from melonoma 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    Social History:   reports that she has never smoked. She has never used smokeless tobacco. She reports that she does not drink alcohol and does not use drugs.  Family History  Problem Relation Age of Onset   Heart disease Mother    Aortic aneurysm Father    Breast cancer Daughter 62   Breast cancer Niece 68    Medications: Patient's Medications  New Prescriptions   No medications on file  Previous Medications   ASPIRIN 81 MG TABLET    Take 81 mg by mouth daily.   CHOLECALCIFEROL (VITAMIN D3) 2000 UNITS TABS    Take 2,000 Units by mouth daily.    DENOSUMAB (PROLIA) 60 MG/ML SOSY INJECTION    Inject 60 mg into the skin every 6 (six) months.   KETOCONAZOLE (NIZORAL) 2 % CREAM    Apply between toes and around toenails every night.   LETROZOLE (FEMARA) 2.5 MG TABLET    TAKE 1 TABLET BY MOUTH EVERY DAY   LISINOPRIL (ZESTRIL) 30 MG TABLET    TAKE 1 TABLET BY MOUTH EVERY DAY   METOPROLOL TARTRATE (LOPRESSOR) 25 MG TABLET    TAKE 1 TABLET BY MOUTH TWICE A DAY   METRONIDAZOLE (METROCREAM) 0.75 % CREAM    Apply to nose every night for rosacea.   POLYETHYLENE GLYCOL (MIRALAX / GLYCOLAX) PACKET    Take 17 g by mouth every other day.   VITAMIN B-12 (CYANOCOBALAMIN) 1000 MCG TABLET    Take 1,000 mcg by mouth daily.   WARFARIN (COUMADIN) 5 MG TABLET    Take 7.5 mg daily except for Wednesday.  On Wednesday take 5 mg.   WHEAT DEXTRIN (BENEFIBER PO)    Take 2 each by mouth every other day.  Modified Medications   No medications on file  Discontinued Medications   No medications on file    Physical Exam:  Vitals:   04/11/23 0923  BP: 138/88  Pulse: 67  Temp: (!) 97.4 F (36.3 C)  SpO2: 97%  Weight: 131 lb (59.4 kg)  Height: 5\' 2"  (1.575 m)   Body mass index is 23.96 kg/m. Wt Readings from Last 3 Encounters:  04/11/23 131 lb  (59.4 kg)  04/05/23 130 lb (59 kg)  01/23/23 131 lb 12.8 oz (59.8 kg)    Physical Exam Constitutional:      General: She is not in acute distress.    Appearance: She is well-developed. She is not diaphoretic.  HENT:     Head: Normocephalic and atraumatic.     Mouth/Throat:     Pharynx: No oropharyngeal exudate.  Eyes:     Conjunctiva/sclera: Conjunctivae normal.     Pupils: Pupils are equal, round, and reactive to light.  Cardiovascular:     Rate and Rhythm: Normal rate and regular rhythm.     Heart sounds: Normal heart sounds.  Pulmonary:     Effort: Pulmonary effort  is normal.     Breath sounds: Normal breath sounds.  Abdominal:     General: Bowel sounds are normal.     Palpations: Abdomen is soft.  Musculoskeletal:     Cervical back: Normal range of motion and neck supple.     Right lower leg: No edema.     Left lower leg: No edema.  Skin:    General: Skin is warm and dry.  Neurological:     Mental Status: She is alert.  Psychiatric:        Mood and Affect: Mood normal.     Labs reviewed: Basic Metabolic Panel: Recent Labs    07/01/22 2105 07/23/22 1305 01/01/23 1544 01/23/23 1321 04/05/23 1445  NA 138   < > 139 139 138  K 3.6   < > 4.2 4.4 4.3  CL 105   < > 106 103 105  CO2 24   < > 24 26 26   GLUCOSE 115*   < > 96 92 94  BUN 17   < > 23 21 16   CREATININE 0.74   < > 0.66 0.82 0.68  CALCIUM 9.4   < > 10.0 9.7 9.9  TSH 1.366  --   --   --   --    < > = values in this interval not displayed.   Liver Function Tests: Recent Labs    07/01/22 2105 07/23/22 1305 10/11/22 1429 01/23/23 1321 04/05/23 1445  AST 23 20 17 21 15   ALT 15 13 11 15 9   ALKPHOS 67 55  --  61  --   BILITOT 0.8 0.6 0.5 0.5 0.4  PROT 7.1 6.8 6.3 7.5 6.6  ALBUMIN 4.1 3.8  --  3.9  --    No results for input(s): "LIPASE", "AMYLASE" in the last 8760 hours. No results for input(s): "AMMONIA" in the last 8760 hours. CBC: Recent Labs    07/23/22 1305 10/11/22 1429 01/01/23 1544  01/23/23 1321 04/05/23 1445  WBC 5.6   < > 7.3 5.5 5.7  NEUTROABS 3.9  --   --  4.1  --   HGB 13.2   < > 14.4 13.2 13.3  HCT 39.6   < > 43.3 40.2 39.6  MCV 95.4   < > 94.5 96.2 95.2  PLT 194   < > 185 274 197   < > = values in this interval not displayed.   Lipid Panel: Recent Labs    10/11/22 1429 04/05/23 1445  CHOL 204* 203*  HDL 80 73  LDLCALC 111* 110*  TRIG 44 97  CHOLHDL 2.6 2.8   TSH: Recent Labs    07/01/22 2105  TSH 1.366   A1C: Lab Results  Component Value Date   HGBA1C 5.6 04/05/2023     Assessment/Plan 1. History of pulmonary embolus (PE) -continue on coumadin, has INR checked at [facility]  2. Age-related osteoporosis without current pathological fracture -continues on prolia through oncology -Recommended to take calcium 600 mg twice daily with Vitamin D 2000 units daily and weight bearing activity 30 mins/5 days a week  3. HTN, goal below 140/90 -Blood pressure well controlled, goal bp <140/90 Continue current medications and dietary modifications follow metabolic panel  4. Idiopathic peripheral neuropathy -stable at this time, plans to discuss more with podiatrist.   5. History of breast cancer -continues on letrozole through oncology  6. Chronic idiopathic constipation -stable on current regimen   Next appt:05/16/2023 for AWV Safiatou Islam K. Biagio Borg  [facility] [phone]

## 2023-04-18 DIAGNOSIS — Z7901 Long term (current) use of anticoagulants: Secondary | ICD-10-CM | POA: Diagnosis not present

## 2023-04-18 DIAGNOSIS — Z86718 Personal history of other venous thrombosis and embolism: Secondary | ICD-10-CM | POA: Diagnosis not present

## 2023-04-18 DIAGNOSIS — Z86711 Personal history of pulmonary embolism: Secondary | ICD-10-CM | POA: Diagnosis not present

## 2023-04-19 ENCOUNTER — Telehealth: Payer: Self-pay

## 2023-04-19 ENCOUNTER — Other Ambulatory Visit: Payer: Self-pay | Admitting: Student

## 2023-04-19 DIAGNOSIS — Z86711 Personal history of pulmonary embolism: Secondary | ICD-10-CM

## 2023-04-19 NOTE — Telephone Encounter (Signed)
Copied and pasted due to the way provider responded to message:  Becky Mealing, MD  Maurice Small, CMA Cc: Becky Farrell, [name], [place] Caller: Unspecified (Today,  9:34 AM) Please contact patient and request she take additional 2.5 mg for the next two nights. She should take 7.5 mg daily. INR recheck for Monday (5/20) at 7:30AM at [facility] and again in 2 weeks (6/3) in clinic. I will put the order in electronically.  I called patient to verify how she is currently taking Coumadin. Per patient she is taking a 5 mg tablet daily, which is different from how it is listed on her medication list after medications confirmed on 04/11/23 at establish care visit with Abbey Chatters, NP.       I shared Dr.Beamer's above response with patient. Patient verbalized understanding. After talking with patient for 18 minutes about how long she has been on a 5 mg tablet, I informed patient that I will call her back.

## 2023-04-19 NOTE — Telephone Encounter (Signed)
Elijah from [facility] called to report pts INR 1.8 and PT 17.2. Results read back and correct.   Called [facility] and spoke with Rachell and given results.   Advised that Abbey Chatters NP is now PCP. Called clinical line and LM on VM PT  and INR results

## 2023-04-19 NOTE — Telephone Encounter (Signed)
I called patient back and she reiterated that she has been on 5 mg every day for a very long time and will follow Dr.Beamer's instructions.  Dr.Beamer please review how medication is now listed on the med list to assure that it is correct, if so ok to close encounter.  Synetta Fail please assure that order is printed and available for lab tech on Monday

## 2023-04-19 NOTE — Telephone Encounter (Signed)
Order printed and placed in Tray at [facility] for [facility] to draw.

## 2023-04-19 NOTE — Telephone Encounter (Signed)
I received a message on the clinical intake voicemail from [place] with the information that she has listed within this encounter. I will forward to Dr.Beamer who also covers [facility].   Side Note: Abbey Chatters, NP is out of office today

## 2023-04-19 NOTE — Addendum Note (Signed)
Addended by: Maurice Small on: 04/19/2023 03:12 PM   Modules accepted: Orders

## 2023-04-19 NOTE — Progress Notes (Signed)
Patient with sub therapeutic INR, increased dose for 2 days and recheck on Monday. Increase weekly dose to 7.5 mg daily.

## 2023-04-21 ENCOUNTER — Telehealth (INDEPENDENT_AMBULATORY_CARE_PROVIDER_SITE_OTHER): Payer: Medicare Other | Admitting: Student

## 2023-04-21 DIAGNOSIS — Z7901 Long term (current) use of anticoagulants: Secondary | ICD-10-CM

## 2023-04-21 NOTE — Telephone Encounter (Signed)
Called Becky Farrell to clarify her Warfarin dosing. She is not taking as previously prescribed. She will take 7.5 mg tonight, labs collected in the morning at her home. She will also need to have an appointment with me in the afternoon to discuss next steps in management for her warfarin. Forwarding to Anita,CMA to help with getting patient on PM schedule.   I spent >11 minutes on the phone discussing patient's plan.   Patient location: her home Provider location: home   Coralyn Helling, MD, [facility] [phone]

## 2023-04-22 ENCOUNTER — Encounter: Payer: Self-pay | Admitting: Student

## 2023-04-22 ENCOUNTER — Ambulatory Visit: Payer: Medicare Other | Admitting: Student

## 2023-04-22 VITALS — BP 150/96 | HR 71 | Temp 96.4°F | Ht 62.0 in | Wt 130.0 lb

## 2023-04-22 DIAGNOSIS — Z7901 Long term (current) use of anticoagulants: Secondary | ICD-10-CM | POA: Diagnosis not present

## 2023-04-22 DIAGNOSIS — Z86711 Personal history of pulmonary embolism: Secondary | ICD-10-CM | POA: Diagnosis not present

## 2023-04-22 LAB — PROTIME-INR
INR: 2.8 — ABNORMAL HIGH
Prothrombin Time: 27.8 s — ABNORMAL HIGH (ref 9.0–11.5)

## 2023-04-22 NOTE — Patient Instructions (Signed)
Nice to meet you in person today  Here is your new Warfarin Dosing: Mondays and Thursdays: Warfarin 7.5 mg (1.5 tablets)  Sunday, Tuesday, Wednesday, Friday, Saturday: Warfarin 5 mg nightly (1 tablet)

## 2023-04-22 NOTE — Telephone Encounter (Signed)
Appointment scheduled with Dr. Sydnee Cabal in clinic for 04/22/2023

## 2023-04-22 NOTE — Progress Notes (Signed)
[facility].   Provider: Dr. Earnestine Mealing  Code Status: Full Code.  Goals of Care:     04/22/2023    2:27 PM  Advanced Directives  Does Patient Have a Medical Advance Directive? Yes  Type of Estate agent of [place];Living will  Does patient want to make changes to medical advance directive? No - Patient declined  Copy of Healthcare Power of Attorney in Chart? Yes - validated most recent copy scanned in chart (See row information)     Chief Complaint  Patient presents with   Acute Visit    To Discuss Coumadin Dosage.    Quality Metric Gaps    To discuss need for Zoster and Pneumonia Vaccine or postpone if patient refuses.     HPI: Patient is a 89 y.o. female seen today for an acute visit to discuss Coumadin Dosage.   She is here today to clarify her medications.   She takes 5 mg daily. She has been on warfarin since 2011.   She wants to stay on Coumadin.   She asked about alzheimer's testing and if there was a blood test. Discussed the lumbar puncture, and she said she is not interested.   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 She developed blood clots on a trip to [place] previously.   BP is slightly elevated today, however, she states it is typically well-controlled.   Past Medical History:  Diagnosis Date   Colon polyps    Family history of breast cancer    History of pulmonary embolism 2011   Hx of melanoma of skin 2003   Right leg   Hypertension    Melanoma (HCC) 2003   right lower leg. [place]ated [place].   Personal history of radiation therapy    Seizures (HCC) 1993   no meds since (approx) 2014   Vertigo    x1. R/T perforated ear drum prior to 2013    Past Surgical History:  Procedure Laterality Date   ABDOMINAL HYSTERECTOMY  05/2014   BREAST BIOPSY Right 08/20/2018   invasive mammary carcinoma/DCIS    BREAST LUMPECTOMY Right 10/10/2018   BREAST LUMPECTOMY WITH RADIOACTIVE SEED LOCALIZATION Right 10/10/2018    Procedure: BREAST LUMPECTOMY WITH RADIOACTIVE SEED LOCALIZATION;  Surgeon: Griselda Miner, MD;  Location: [facility];  Service: General;  Laterality: Right;   CATARACT EXTRACTION W/PHACO Right 01/06/2018   Procedure: CATARACT EXTRACTION PHACO AND INTRAOCULAR LENS PLACEMENT (IOC) RIGHT;  Surgeon: Lockie Mola, MD;  Location: [facility];  Service: Ophthalmology;  Laterality: Right;   CATARACT EXTRACTION W/PHACO Left 01/29/2018   Procedure: CATARACT EXTRACTION PHACO AND INTRAOCULAR LENS PLACEMENT (IOC) LEFT;  Surgeon: Lockie Mola, MD;  Location: [facility];  Service: Ophthalmology;  Laterality: Left;   left hand surgery  05/2011   s/p fall   OTHER SURGICAL HISTORY  2003   Removal of lymphoma from melonoma 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     Allergies  Allergen Reactions   Clobetasol Other (See Comments)    vertigo   Clobetasol Propionate Other (See Comments)    This is ointment form UNSPECIFIED REACTION    Phenergan [Promethazine Hcl] Other (See Comments)    Rapid heart rate   Dextromethorphan Palpitations and Other (See Comments)    promethazine with DM, rapid hearbeat   Zithromax [Azithromycin] Palpitations and Other (See Comments)    Per OSH report, pt unable to recall Rapid heart rate    Outpatient Encounter Medications as of 04/22/2023  Medication Sig   aspirin 81 MG tablet Take 81 mg by mouth daily.   Cholecalciferol (VITAMIN D3) 2000 UNITS TABS Take 2,000 Units by mouth daily.    denosumab (PROLIA) 60 MG/ML SOSY injection Inject 60 mg into the skin every 6 (six) months.   ketoconazole (NIZORAL) 2 % cream Apply between toes and around toenails every night.   letrozole (FEMARA) 2.5 MG tablet TAKE 1 TABLET BY MOUTH EVERY DAY   lisinopril (ZESTRIL) 30 MG tablet TAKE 1 TABLET BY MOUTH EVERY DAY   metoprolol tartrate (LOPRESSOR) 25 MG tablet TAKE 1 TABLET BY MOUTH TWICE A DAY   metroNIDAZOLE (METROCREAM) 0.75 % cream Apply to nose every night for rosacea.   polyethylene  glycol (MIRALAX / GLYCOLAX) packet Take 17 g by mouth every other day.   vitamin B-12 (CYANOCOBALAMIN) 1000 MCG tablet Take 1,000 mcg by mouth daily.   warfarin (COUMADIN) 5 MG tablet 7.5 mg daily x 2 nights then resume 5 mg daily   Wheat Dextrin (BENEFIBER PO) Take 2 each by mouth every other day.   No facility-administered encounter medications on file as of 04/22/2023.    Review of Systems:  Review of Systems  Health Maintenance  Topic Date Due   Zoster Vaccines- Shingrix (1 of 2) Never done   Pneumonia Vaccine 63+ Years old (3 of 3 - PPSV23 or PCV20) 12/05/2021   Medicare Annual Wellness (AWV)  05/08/2022   INFLUENZA VACCINE  07/04/2023   MAMMOGRAM  08/21/2023   DTaP/Tdap/Td (3 - Td or Tdap) 05/09/2031   DEXA SCAN  Completed   HPV VACCINES  Aged Out   COVID-19 Vaccine  Discontinued    Physical Exam: Vitals:   04/22/23 1422 04/22/23 1430  BP: (!) 152/96 (!) 150/96  Pulse: 71   Temp: (!) 96.4 F (35.8 C)   SpO2: (!) 71%   Weight: 130 lb (59 kg)   Height: 5\' 2"  (1.575 m)    Body mass index is 23.78 kg/m. Physical Exam Constitutional:      Appearance: Normal appearance.  Neurological:     General: No focal deficit present.     Mental Status: She is alert.     Labs reviewed: Basic Metabolic Panel: Recent Labs    07/01/22 2105 07/23/22 1305 01/01/23 1544 01/23/23 1321 04/05/23 1445  NA 138   < > 139 139 138  K 3.6   < > 4.2 4.4 4.3  CL 105   < > 106 103 105  CO2 24   < > 24 26 26   GLUCOSE 115*   < > 96 92 94  BUN 17   < > 23 21 16   CREATININE 0.74   < > 0.66 0.82 0.68  CALCIUM 9.4   < > 10.0 9.7 9.9  TSH 1.366  --   --   --   --    < > = values in this interval not displayed.   Liver Function Tests: Recent Labs    07/01/22 2105 07/23/22 1305 10/11/22 1429 01/23/23 1321 04/05/23 1445  AST 23 20 17 21 15   ALT 15 13 11 15 9   ALKPHOS 67 55  --  61  --   BILITOT 0.8 0.6 0.5 0.5 0.4  PROT 7.1 6.8 6.3 7.5 6.6  ALBUMIN 4.1 3.8  --  3.9  --    No  results for input(s): "LIPASE", "AMYLASE" in the last 8760 hours. No results for input(s): "AMMONIA" in the last 8760 hours. CBC: Recent Labs  07/23/22 1305 10/11/22 1429 01/01/23 1544 01/23/23 1321 04/05/23 1445  WBC 5.6   < > 7.3 5.5 5.7  NEUTROABS 3.9  --   --  4.1  --   HGB 13.2   < > 14.4 13.2 13.3  HCT 39.6   < > 43.3 40.2 39.6  MCV 95.4   < > 94.5 96.2 95.2  PLT 194   < > 185 274 197   < > = values in this interval not displayed.   Lipid Panel: Recent Labs    10/11/22 1429 04/05/23 1445  CHOL 204* 203*  HDL 80 73  LDLCALC 111* 110*  TRIG 44 97  CHOLHDL 2.6 2.8   Lab Results  Component Value Date   HGBA1C 5.6 04/05/2023    Procedures since last visit: No results found.  Assessment/Plan Chronic anticoagulation Patient had subtherapeutic level of INR last week 1.8. Increased dose to 7.5x3d over the weekend which increased INR to 2.8. Her previously weekly dose was 35mg  total and discussed increasing dose by 20% (~7.5mg ) was almost too much, and perhaps we should only have 2d/wk of 7.5mg .   Plan to take 7.5 mg Mondays and Thursdays and 5 mg SunTWFSat.  Labs/tests ordered:  * No order type specified * Next appt:  05/16/2023   I spent greater than 30 minutes for the care of this patient in face to face time, chart review, clinical documentation, patient education.

## 2023-05-06 ENCOUNTER — Encounter: Payer: Self-pay | Admitting: Podiatry

## 2023-05-06 ENCOUNTER — Ambulatory Visit (INDEPENDENT_AMBULATORY_CARE_PROVIDER_SITE_OTHER): Payer: Medicare Other | Admitting: Podiatry

## 2023-05-06 ENCOUNTER — Telehealth: Payer: Self-pay | Admitting: Student

## 2023-05-06 DIAGNOSIS — M79609 Pain in unspecified limb: Secondary | ICD-10-CM | POA: Diagnosis not present

## 2023-05-06 DIAGNOSIS — B351 Tinea unguium: Secondary | ICD-10-CM

## 2023-05-06 DIAGNOSIS — Z86711 Personal history of pulmonary embolism: Secondary | ICD-10-CM | POA: Diagnosis not present

## 2023-05-06 DIAGNOSIS — D689 Coagulation defect, unspecified: Secondary | ICD-10-CM

## 2023-05-06 DIAGNOSIS — Z7901 Long term (current) use of anticoagulants: Secondary | ICD-10-CM

## 2023-05-06 LAB — PROTIME-INR
INR: 2.2 — ABNORMAL HIGH
Prothrombin Time: 22.4 s — ABNORMAL HIGH (ref 9.0–11.5)

## 2023-05-06 NOTE — Telephone Encounter (Signed)
Please contact patient to notify that her current INR is 2.2 which is at goal. She should continue this regimen. Notifying NP as well to help with continued coordination and management of anticoagulation.

## 2023-05-06 NOTE — Progress Notes (Unsigned)
  Subjective:  Patient ID: Becky Farrell, female    DOB: 03-10-1934,  MRN: [id number]  Becky Farrell presents to clinic today for:  Chief Complaint  Patient presents with   Nail Problem    RFC,Referring Provider Ellan Lambert       PCP is Sharon Seller, NP.  Allergies  Allergen Reactions   Clobetasol Other (See Comments)    vertigo   Clobetasol Propionate Other (See Comments)    This is ointment form UNSPECIFIED REACTION    Phenergan [Promethazine Hcl] Other (See Comments)    Rapid heart rate   Dextromethorphan Palpitations and Other (See Comments)    promethazine with DM, rapid hearbeat   Zithromax [Azithromycin] Palpitations and Other (See Comments)    Per OSH report, pt unable to recall Rapid heart rate    Review of Systems: Negative except as noted in the HPI.  Objective: No changes noted in today's physical examination. There were no vitals filed for this visit.  [name] is a pleasant 89 y.o. female in NAD. AAO x 3.  Vascular Examination: Capillary refill time <3 seconds b/l LE. Palpable pedal pulses b/l LE. Digital hair present b/l. No pedal edema b/l. Skin temperature gradient WNL b/l. No varicosities b/l. {jgvascular:23595}.  Dermatological Examination: Pedal skin with normal turgor, texture and tone b/l. No open wounds. No interdigital macerations b/l. Toenails 1-5 b/l thickened, discolored, dystrophic with subungual debris. There is pain on palpation to dorsal aspect of nailplates. {jgderm:23598}.  Neurological Examination: Protective sensation intact with 10 gram monofilament b/l LE. Vibratory sensation intact b/l LE. {jgneuro:23601::"Protective sensation intact 5/5 intact bilaterally with 10g monofilament b/l.","Vibratory sensation intact b/l.","Proprioception intact bilaterally."}  Musculoskeletal Examination: Muscle strength 5/5 to all LE muscle groups b/l. {jgmsk:23600}     Latest Ref Rng & Units 04/05/2023    2:45 PM   Hemoglobin A1C  Hemoglobin-A1c <5.7 % of total Hgb 5.6     Assessment/Plan: 1. Pain due to onychomycosis of nail   2. Coagulation defect (HCC)     No orders of the defined types were placed in this encounter.  None {Jgplan:23602::"-Patient/POA to call should there be question/concern in the interim."}   Return in about 3 months (around 08/06/2023).  [name], DPM

## 2023-05-07 NOTE — Telephone Encounter (Signed)
Patient returned call and results were discussed. She verbalized her understanding.

## 2023-05-07 NOTE — Telephone Encounter (Signed)
LMOM to return call.

## 2023-05-08 NOTE — Telephone Encounter (Signed)
Tried calling patient. LMOM to return call.  Orders placed and printed and placed in [facility] in [place].

## 2023-05-08 NOTE — Telephone Encounter (Signed)
4 weeks from prior INR.

## 2023-05-08 NOTE — Telephone Encounter (Signed)
Patient notified and agreed.  

## 2023-05-08 NOTE — Addendum Note (Signed)
Addended by: Nelda Severe A on: 05/08/2023 01:46 PM   Modules accepted: Orders

## 2023-05-08 NOTE — Telephone Encounter (Signed)
Patient called back and wants to know when she should have another PT/INR done.   Please Advise.

## 2023-05-16 ENCOUNTER — Ambulatory Visit (INDEPENDENT_AMBULATORY_CARE_PROVIDER_SITE_OTHER): Payer: Medicare Other | Admitting: Nurse Practitioner

## 2023-05-16 ENCOUNTER — Encounter: Payer: Self-pay | Admitting: Nurse Practitioner

## 2023-05-16 VITALS — BP 136/80 | HR 76 | Temp 98.7°F | Ht 62.0 in | Wt 130.5 lb

## 2023-05-16 DIAGNOSIS — Z0000 Encounter for general adult medical examination without abnormal findings: Secondary | ICD-10-CM

## 2023-05-16 NOTE — Progress Notes (Signed)
Subjective:   Becky Farrell is a 89 y.o. female who presents for Medicare Annual (Subsequent) preventive examination.  Review of Systems     Cardiac Risk Factors include: advanced age (>42men, >19 women);hypertension;sedentary lifestyle     Objective:    Today's Vitals   05/16/23 1018  BP: 136/80  Pulse: 76  Temp: 98.7 F (37.1 C)  TempSrc: Temporal  SpO2: 98%  Weight: 130 lb 8 oz (59.2 kg)  Height: 5\' 2"  (1.575 m)   Body mass index is 23.87 kg/m.     05/16/2023    8:34 AM 04/22/2023    2:27 PM 04/11/2023    9:31 AM 07/23/2022    2:02 PM 03/22/2022    2:15 PM 01/22/2022    2:39 PM 07/12/2021    1:21 PM  Advanced Directives  Does Patient Have a Medical Advance Directive? Yes Yes Yes Yes No Yes Yes  Type of Estate agent of [place];Living will Healthcare Power of [place];Living will Healthcare Power of [place];Living will Living will;Healthcare Power of [employer] Power of [place];Living will Healthcare Power of [place];Living will  Does patient want to make changes to medical advance directive? No - Patient declined No - Patient declined No - Patient declined   No - Patient declined   Copy of Healthcare Power of Attorney in Chart? Yes - validated most recent copy scanned in chart (See row information) Yes - validated most recent copy scanned in chart (See row information) Yes - validated most recent copy scanned in chart (See row information)    Yes - validated most recent copy scanned in chart (See row information)  Would patient like information on creating a medical advance directive?     No - Patient declined      Current Medications (verified) Outpatient Encounter Medications as of 05/16/2023  Medication Sig   aspirin 81 MG tablet Take 81 mg by mouth daily.   Cholecalciferol (VITAMIN D3) 2000 UNITS TABS Take 2,000 Units by mouth daily.    denosumab (PROLIA) 60 MG/ML SOSY injection Inject 60 mg into the skin every 6 (six) months.    ketoconazole (NIZORAL) 2 % cream Apply between toes and around toenails every night.   letrozole (FEMARA) 2.5 MG tablet TAKE 1 TABLET BY MOUTH EVERY DAY   lisinopril (ZESTRIL) 30 MG tablet TAKE 1 TABLET BY MOUTH EVERY DAY   metoprolol tartrate (LOPRESSOR) 25 MG tablet TAKE 1 TABLET BY MOUTH TWICE A DAY   metroNIDAZOLE (METROCREAM) 0.75 % cream Apply to nose every night for rosacea.   polyethylene glycol (MIRALAX / GLYCOLAX) packet Take 17 g by mouth every other day.   vitamin B-12 (CYANOCOBALAMIN) 1000 MCG tablet Take 1,000 mcg by mouth daily.   warfarin (COUMADIN) 5 MG tablet Take 5 mg by mouth daily. EXCEPT 7.5 mg daily on Monday and Thursday   Wheat Dextrin (BENEFIBER PO) Take 2 each by mouth every other day.   No facility-administered encounter medications on file as of 05/16/2023.    Allergies (verified) Clobetasol, Clobetasol propionate, Phenergan [promethazine hcl], Dextromethorphan, and Zithromax [azithromycin]   History: Past Medical History:  Diagnosis Date   Colon polyps    Family history of breast cancer    History of pulmonary embolism 2011   Hx of melanoma of skin 2003   Right leg   Hypertension    Melanoma (HCC) 2003   right lower leg. [place] in [place].   Personal history of radiation therapy    Seizures (HCC) 1993  no meds since (approx) 2014   Vertigo    x1. R/T perforated ear drum prior to 2013   Past Surgical History:  Procedure Laterality Date   ABDOMINAL HYSTERECTOMY  05/2014   BREAST BIOPSY Right 08/20/2018   invasive mammary carcinoma/DCIS    BREAST LUMPECTOMY Right 10/10/2018   BREAST LUMPECTOMY WITH RADIOACTIVE SEED LOCALIZATION Right 10/10/2018   Procedure: BREAST LUMPECTOMY WITH RADIOACTIVE SEED LOCALIZATION;  Surgeon: Griselda Miner, MD;  Location: [facility] OR;  Service: General;  Laterality: Right;   CATARACT EXTRACTION W/PHACO Right 01/06/2018   Procedure: CATARACT EXTRACTION PHACO AND INTRAOCULAR LENS PLACEMENT (IOC) RIGHT;  Surgeon: Lockie Mola,  MD;  Location: [facility];  Service: Ophthalmology;  Laterality: Right;   CATARACT EXTRACTION W/PHACO Left 01/29/2018   Procedure: CATARACT EXTRACTION PHACO AND INTRAOCULAR LENS PLACEMENT (IOC) LEFT;  Surgeon: Lockie Mola, MD;  Location: [facility];  Service: Ophthalmology;  Laterality: Left;   left hand surgery  05/2011   s/p fall   OTHER SURGICAL HISTORY  2003   Removal of lymphoma from melonoma 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    Family History  Problem Relation Age of Onset   Heart disease Mother    Aortic aneurysm Father    Breast cancer Daughter 79   Breast cancer Niece 33   Social History   Socioeconomic History   Marital status: Married    Spouse name: Not on file   Number of children: Not on file   Years of education: Not on file   Highest education level: Not on file  Occupational History   Not on file  Tobacco Use   Smoking status: Never   Smokeless tobacco: Never  Vaping Use   Vaping Use: Never used  Substance and Sexual Activity   Alcohol use: No   Drug use: Never   Sexual activity: Never  Other Topics Concern   Not on file  Social History Narrative   Recently moved from [place] to [facility]   Daughter and grandchildren live in [place].   Has other children in [place].   Retired Runner, broadcasting/film/video.      Husband has been recovering from Prostate CA.   Social Determinants of Health   Financial Resource Strain: Not on file  Food Insecurity: Not on file  Transportation Needs: Not on file  Physical Activity: Not on file  Stress: Not on file  Social Connections: Not on file    Tobacco Counseling Counseling given: Not Answered   Clinical Intake:  Pre-visit preparation completed: Yes  Pain : No/denies pain     BMI - recorded: 23 Nutritional Status: BMI of 19-24  Normal Diabetes: No  How often do you need to have someone help you when you read instructions, pamphlets, or other written materials from your doctor or pharmacy?: 1 -  Never  Diabetic?no         Activities of Daily Living    05/16/2023   10:49 AM 04/05/2023    2:51 PM  In your present state of health, do you have any difficulty performing the following activities:  Hearing? 1 1  Vision? 0 1  Difficulty concentrating or making decisions? 1 1  Walking or climbing stairs? 0 1  Dressing or bathing? 0 0  Doing errands, shopping? 1 1  Preparing Food and eating ? Y   Comment close to "closing the kitchen"   Using the Toilet? N   In the past six months, have you accidently leaked urine? Y   Do you have  problems with loss of bowel control? Y   Managing your Medications? N   Managing your Finances? Y   Comment never have   Housekeeping or managing your Housekeeping? N     Patient Care Team: Sharon Seller, NP as PCP - General (Geriatric Medicine) Iran Ouch, MD as PCP - Cardiology (Cardiology) Carmina Miller, MD as Radiation Oncologist (Radiation Oncology) Rickard Patience, MD as Consulting Physician (Oncology) Lockie Mola, MD as Referring Physician (Ophthalmology)  Indicate any recent Medical Services you may have received from other than [facility] providers in the past year (date may be approximate).     Assessment:   This is a routine wellness examination for [place].  Hearing/Vision screen Hearing Screening - Comments:: Decreased hearing, under the care of audiologist, patient is deciding if she would like hearing aids.  Vision Screening - Comments:: Last eye exam less than 12 months ago, Dr.Brasington   Dietary issues and exercise activities discussed: Current Exercise Habits: The patient does not participate in regular exercise at present   Goals Addressed             This Visit's Progress    Increase physical activity       Walking first and continue to walk without use of cane       Depression Screen    05/16/2023    8:42 AM 04/11/2023    9:32 AM 04/05/2023    2:51 PM 01/03/2023    9:14 AM 10/11/2022    2:28 PM  01/02/2022    4:19 PM 10/09/2021    2:40 PM  PHQ 2/9 Scores  PHQ - 2 Score 0 0 0 0 0 0 0  PHQ- 9 Score      2 4    Fall Risk    05/16/2023    8:42 AM 04/11/2023    9:32 AM 04/05/2023    2:51 PM 01/03/2023    9:14 AM 10/11/2022    2:28 PM  Fall Risk   Falls in the past year? 0 0 0 0 1  Number falls in past yr: 0 0   0  Injury with Fall? 0 0 0 0 0  Risk for fall due to : No Fall Risks   No Fall Risks   Follow up Falls evaluation completed        FALL RISK PREVENTION PERTAINING TO THE HOME:  Any stairs in or around the home? Yes  If so, are there any without handrails? No  Home free of loose throw rugs in walkways, pet beds, electrical cords, etc? Yes  Adequate lighting in your home to reduce risk of falls? Yes   ASSISTIVE DEVICES UTILIZED TO PREVENT FALLS:  Life alert? Yes  Use of a cane, walker or w/c? No  Grab bars in the bathroom? Yes  Shower chair or bench in shower? Yes  Elevated toilet seat or a handicapped toilet? No   TIMED UP AND GO:  Was the test performed? No .   Cognitive Function:    05/16/2023   10:35 AM 11/29/2016    2:15 PM  MMSE - Mini Mental State Exam  Orientation to time 5 5  Orientation to Place 5 5  Registration 3 3  Attention/ Calculation 5 0  Recall 3 3  Language- name 2 objects 2 0  Language- repeat 1 1  Language- follow 3 step command 3 3  Language- read & follow direction 1 0  Write a sentence 1 0  Copy design 1 0  Total score 30 20        Immunizations Immunization History  Administered Date(s) Administered   Fluad Quad(high Dose 65+) 08/05/2019, 08/24/2020   Influenza Split 11/02/1999, 12/17/2000, 10/29/2001, 09/14/2002, 08/20/2012   Influenza, High Dose Seasonal PF 09/21/2018, 09/24/2022   Influenza,inj,Quad PF,6+ Mos 08/06/2014, 08/26/2017   Influenza-Unspecified 08/23/2013, 09/02/2016, 09/28/2021   Moderna Sars-Covid-2 Vaccination 12/17/2019, 01/14/2020, 10/18/2020   Pneumococcal Conjugate-13 12/05/2016   Pneumococcal  Polysaccharide-23 12/17/2000   Tdap 05/18/2011, 05/08/2021   Zoster, Live 09/05/2012    TDAP status: Up to date  Flu Vaccine status: Up to date  Pneumococcal vaccine status: Due, Education has been provided regarding the importance of this vaccine. Advised may receive this vaccine at local pharmacy or Health Dept. Aware to provide a copy of the vaccination record if obtained from local pharmacy or Health Dept. Verbalized acceptance and understanding.  Covid-19 vaccine status: Information provided on how to obtain vaccines.   Qualifies for Shingles Vaccine? Yes   Zostavax completed No   Shingrix Completed?: No.    Education has been provided regarding the importance of this vaccine. Patient has been advised to call insurance company to determine out of pocket expense if they have not yet received this vaccine. Advised may also receive vaccine at local pharmacy or Health Dept. Verbalized acceptance and understanding.  Screening Tests Health Maintenance  Topic Date Due   Zoster Vaccines- Shingrix (1 of 2) Never done   Pneumonia Vaccine 22+ Years old (3 of 3 - PPSV23 or PCV20) 12/05/2021   INFLUENZA VACCINE  07/04/2023   MAMMOGRAM  08/21/2023   Medicare Annual Wellness (AWV)  05/15/2024   DTaP/Tdap/Td (3 - Td or Tdap) 05/09/2031   DEXA SCAN  Completed   HPV VACCINES  Aged Out   COVID-19 Vaccine  Discontinued    Health Maintenance  Health Maintenance Due  Topic Date Due   Zoster Vaccines- Shingrix (1 of 2) Never done   Pneumonia Vaccine 82+ Years old (3 of 3 - PPSV23 or PCV20) 12/05/2021    Colorectal cancer screening: No longer required.   Mammogram status: No longer required due to age.  Bone Density status: Completed 08/2022. Results reflect: Bone density results: OSTEOPOROSIS. Repeat every 2 years.  Lung Cancer Screening: (Low Dose CT Chest recommended if Age 61-80 years, 30 pack-year currently smoking OR have quit w/in 15years.) does not qualify.   Lung Cancer Screening  Referral: na  Additional Screening:  Hepatitis C Screening: does not qualify; Completed na  Vision Screening: Recommended annual ophthalmology exams for early detection of glaucoma and other disorders of the eye. Is the patient up to date with their annual eye exam?  Yes  Who is the provider or what is the name of the office in which the patient attends annual eye exams? Dr.Brasington If pt is not established with a provider, would they like to be referred to a provider to establish care? No .   Dental Screening: Recommended annual dental exams for proper oral hygiene  Community Resource Referral / Chronic Care Management: CRR required this visit?  No   CCM required this visit?  No      Plan:     I have personally reviewed and noted the following in the patient's chart:   Medical and social history Use of alcohol, tobacco or illicit drugs  Current medications and supplements including opioid prescriptions. Patient is not currently taking opioid prescriptions. Functional ability and status Nutritional status Physical activity Advanced directives List of other physicians Hospitalizations, surgeries,  and ER visits in previous 12 months Vitals Screenings to include cognitive, depression, and falls Referrals and appointments  In addition, I have reviewed and discussed with patient certain preventive protocols, quality metrics, and best practice recommendations. A written personalized care plan for preventive services as well as general preventive health recommendations were provided to patient.     Sharon Seller, NP   05/16/2023   Place of service:[facility]

## 2023-05-16 NOTE — Patient Instructions (Addendum)
DUE FOR SHINGLES VACCINE AND PREVNAR 20 both can be given at your local pharmacy    Ms. Bowdoin , Thank you for taking time to come for your Medicare Wellness Visit. I appreciate your ongoing commitment to your health goals. Please review the following plan we discussed and let me know if I can assist you in the future.   These are the goals we discussed:  Goals      Increase physical activity     Walking first and continue to walk without use of cane        This is a list of the screening recommended for you and due dates:  Health Maintenance  Topic Date Due   Zoster (Shingles) Vaccine (1 of 2) Never done   Pneumonia Vaccine (3 of 3 - PPSV23 or PCV20) 12/05/2021   Flu Shot  07/04/2023   Mammogram  08/21/2023   Medicare Annual Wellness Visit  05/15/2024   DTaP/Tdap/Td vaccine (3 - Td or Tdap) 05/09/2031   DEXA scan (bone density measurement)  Completed   HPV Vaccine  Aged Out   COVID-19 Vaccine  Discontinued

## 2023-05-20 ENCOUNTER — Ambulatory Visit: Payer: Medicare Other | Admitting: Student

## 2023-05-20 ENCOUNTER — Encounter: Payer: Self-pay | Admitting: Student

## 2023-05-20 VITALS — BP 138/88 | HR 76 | Temp 97.4°F | Ht 62.0 in | Wt 131.0 lb

## 2023-05-20 DIAGNOSIS — Z711 Person with feared health complaint in whom no diagnosis is made: Secondary | ICD-10-CM | POA: Diagnosis not present

## 2023-05-20 NOTE — Progress Notes (Signed)
[facility].   Provider: Dr. Earnestine Mealing  Code Status: Full Code Goals of Care:     05/20/2023    2:59 PM  Advanced Directives  Does Patient Have a Medical Advance Directive? Yes  Type of Estate agent of [place];Living will  Does patient want to make changes to medical advance directive? No - Patient declined  Copy of Healthcare Power of Attorney in Chart? Yes - validated most recent copy scanned in chart (See row information)     Chief Complaint  Patient presents with   Acute Visit    Left Arm hurt about a week ago and was hard to use. No Trauma. Feeling better today.    Quality Metric Gaps    To Discuss need for Zoster and Pneumonia Vaccine.     HPI: Patient is a 89 y.o. female seen today for an acute visit for Left Arm issues. Could not use a week ago but since then it has gotten much better. Wearing a brace.   Last Monday her hand was swollen. She had an appointment with Shanda Bumps on Thursday. She has improved since 6/13. It seemed like it got better, but she did not take anything. She has been using a brace.   She has questions about gout.  She is wondering about uric acid level sin her body as a post menopausal woman how that impacts her ability to processes uric acid.   She has a lot of questions about her annual wellness visit. She doesn't understand all of the vaccinations and asks numerous questions about tetanus, pnuemonia vaccine, and why she needs shingles vaccine.   She brought a travel package of miralax and states, "The directions are difficult to follow but maybe I can't open it be cause of ar- arthritis?" She has never had an issue with arthritis but a few years ago she had a fall which led to the hand surgeon making 2 cuts on her left hand.  Past Medical History:  Diagnosis Date   Colon polyps    Family history of breast cancer    History of pulmonary embolism 2011   Hx of melanoma of skin 2003   Right leg    Hypertension    Melanoma (HCC) 2003   right lower leg. [place].   Personal history of radiation therapy    Seizures (HCC) 1993   no meds since (approx) 2014   Vertigo    x1. R/T perforated ear drum prior to 2013    Past Surgical History:  Procedure Laterality Date   ABDOMINAL HYSTERECTOMY  05/2014   BREAST BIOPSY Right 08/20/2018   invasive mammary carcinoma/DCIS    BREAST LUMPECTOMY Right 10/10/2018   BREAST LUMPECTOMY WITH RADIOACTIVE SEED LOCALIZATION Right 10/10/2018   Procedure: BREAST LUMPECTOMY WITH RADIOACTIVE SEED LOCALIZATION;  Surgeon: Griselda Miner, MD;  Location: [facility];  Service: General;  Laterality: Right;   CATARACT EXTRACTION W/PHACO Right 01/06/2018   Procedure: CATARACT EXTRACTION PHACO AND INTRAOCULAR LENS PLACEMENT (IOC) RIGHT;  Surgeon: Lockie Mola, MD;  Location: [facility];  Service: Ophthalmology;  Laterality: Right;   CATARACT EXTRACTION W/PHACO Left 01/29/2018   Procedure: CATARACT EXTRACTION PHACO AND INTRAOCULAR LENS PLACEMENT (IOC) LEFT;  Surgeon: Lockie Mola, MD;  Location: [facility];  Service: Ophthalmology;  Laterality: Left;   left hand surgery  05/2011   s/p fall   OTHER SURGICAL HISTORY  2003   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  Allergies  Allergen Reactions   Clobetasol Other (See Comments)    vertigo   Clobetasol Propionate Other (See Comments)    This is ointment form UNSPECIFIED REACTION    Phenergan [Promethazine Hcl] Other (See Comments)    Rapid heart rate   Dextromethorphan Palpitations and Other (See Comments)    promethazine with DM, rapid hearbeat   Zithromax [Azithromycin] Palpitations and Other (See Comments)    Per OSH report, pt unable to recall Rapid heart rate    Outpatient Encounter Medications as of 05/20/2023  Medication Sig   aspirin 81 MG tablet Take 81 mg by mouth daily.   Cholecalciferol (VITAMIN D3) 2000 UNITS TABS Take 2,000 Units by mouth daily.     denosumab (PROLIA) 60 MG/ML SOSY injection Inject 60 mg into the skin every 6 (six) months.   ketoconazole (NIZORAL) 2 % cream Apply between toes and around toenails every night.   letrozole (FEMARA) 2.5 MG tablet TAKE 1 TABLET BY MOUTH EVERY DAY   lisinopril (ZESTRIL) 30 MG tablet TAKE 1 TABLET BY MOUTH EVERY DAY   metoprolol tartrate (LOPRESSOR) 25 MG tablet TAKE 1 TABLET BY MOUTH TWICE A DAY   metroNIDAZOLE (METROCREAM) 0.75 % cream Apply to nose every night for rosacea.   polyethylene glycol (MIRALAX / GLYCOLAX) packet Take 17 g by mouth every other day.   vitamin B-12 (CYANOCOBALAMIN) 1000 MCG tablet Take 1,000 mcg by mouth daily.   warfarin (COUMADIN) 5 MG tablet Take 5 mg by mouth daily. EXCEPT 7.5 mg daily on Monday and Thursday   Wheat Dextrin (BENEFIBER PO) Take 2 each by mouth every other day.   No facility-administered encounter medications on file as of 05/20/2023.    Review of Systems:  Review of Systems  Health Maintenance  Topic Date Due   Zoster Vaccines- Shingrix (1 of 2) Never done   Pneumonia Vaccine 100+ Years old (3 of 3 - PPSV23 or PCV20) 12/05/2021   INFLUENZA VACCINE  07/04/2023   MAMMOGRAM  08/21/2023   Medicare Annual Wellness (AWV)  05/15/2024   DTaP/Tdap/Td (3 - Td or Tdap) 05/09/2031   DEXA SCAN  Completed   HPV VACCINES  Aged Out   COVID-19 Vaccine  Discontinued    Physical Exam: Vitals:   05/20/23 1456  BP: 138/88  Pulse: 76  Temp: (!) 97.4 F (36.3 C)  SpO2: 97%  Weight: 131 lb (59.4 kg)  Height: 5\' 2"  (1.575 m)   Body mass index is 23.96 kg/m. Physical Exam Vitals reviewed.  Constitutional:      Appearance: Normal appearance.  Musculoskeletal:     Comments: Left hand with ulnar deviation of fingers and wrist. Large PIP joint of the index finger. NO redness, tenderness, or swelling.   Neurological:     Mental Status: She is alert.     Comments: Oriented, however, patient starts questions and phrases without finishing them. Changes  topics often to unrelated topics.      Labs reviewed: Basic Metabolic Panel: Recent Labs    07/01/22 2105 07/23/22 1305 01/01/23 1544 01/23/23 1321 04/05/23 1445  NA 138   < > 139 139 138  K 3.6   < > 4.2 4.4 4.3  CL 105   < > 106 103 105  CO2 24   < > 24 26 26   GLUCOSE 115*   < > 96 92 94  BUN 17   < > 23 21 16   CREATININE 0.74   < > 0.66 0.82 0.68  CALCIUM 9.4   < >  10.0 9.7 9.9  TSH 1.366  --   --   --   --    < > = values in this interval not displayed.   Liver Function Tests: Recent Labs    07/01/22 2105 07/23/22 1305 10/11/22 1429 01/23/23 1321 04/05/23 1445  AST 23 20 17 21 15   ALT 15 13 11 15 9   ALKPHOS 67 55  --  61  --   BILITOT 0.8 0.6 0.5 0.5 0.4  PROT 7.1 6.8 6.3 7.5 6.6  ALBUMIN 4.1 3.8  --  3.9  --    No results for input(s): "LIPASE", "AMYLASE" in the last 8760 hours. No results for input(s): "AMMONIA" in the last 8760 hours. CBC: Recent Labs    07/23/22 1305 10/11/22 1429 01/01/23 1544 01/23/23 1321 04/05/23 1445  WBC 5.6   < > 7.3 5.5 5.7  NEUTROABS 3.9  --   --  4.1  --   HGB 13.2   < > 14.4 13.2 13.3  HCT 39.6   < > 43.3 40.2 39.6  MCV 95.4   < > 94.5 96.2 95.2  PLT 194   < > 185 274 197   < > = values in this interval not displayed.   Lipid Panel: Recent Labs    10/11/22 1429 04/05/23 1445  CHOL 204* 203*  HDL 80 73  LDLCALC 111* 110*  TRIG 44 97  CHOLHDL 2.6 2.8   Lab Results  Component Value Date   HGBA1C 5.6 04/05/2023    Procedures since last visit: No results found.  Assessment/Plan Worried well Patient with no active concerns. Numerous questions about gout and preventative care. It is unclear if patient has memory deficits leading to challenges with comprehending the recommendations and directions, I.e. the travel miralax. Previous MMSE 2017 was 20/30, however, 05/16/2023 MMSW 30/30. MoCA in 2022 was 28/30. Will message social worker on campus to perform MoCA evaluation for follow up.    Labs/tests ordered:  *  No order type specified * Next appt:  10/15/2023   I spent greater than 45 minutes for the care of this patient in face to face time, chart review, clinical documentation, patient education.

## 2023-05-30 ENCOUNTER — Ambulatory Visit (INDEPENDENT_AMBULATORY_CARE_PROVIDER_SITE_OTHER): Payer: Medicare Other | Admitting: Nurse Practitioner

## 2023-05-30 ENCOUNTER — Encounter: Payer: Self-pay | Admitting: Nurse Practitioner

## 2023-05-30 VITALS — BP 138/86 | HR 61 | Temp 97.5°F | Ht 62.0 in | Wt 131.0 lb

## 2023-05-30 DIAGNOSIS — I10 Essential (primary) hypertension: Secondary | ICD-10-CM | POA: Diagnosis not present

## 2023-05-30 NOTE — Progress Notes (Signed)
Careteam: Patient Care Team: Sharon Seller, NP as PCP - General (Geriatric Medicine) Iran Ouch, MD as PCP - Cardiology (Cardiology) Carmina Miller, MD as Radiation Oncologist (Radiation Oncology) Rickard Patience, MD as Consulting Physician (Oncology) Lockie Mola, MD as Referring Physician (Ophthalmology)  PLACE OF SERVICE:  [facility]  Advanced Directive information Does Patient Have a Medical Advance Directive?: Yes, Type of Advance Directive: Healthcare Power of [place];Living will, Does patient want to make changes to medical advance directive?: No - Patient declined  Allergies  Allergen Reactions   Clobetasol Other (See Comments)    vertigo   Clobetasol Propionate Other (See Comments)    This is ointment form UNSPECIFIED REACTION    Phenergan [Promethazine Hcl] Other (See Comments)    Rapid heart rate   Dextromethorphan Palpitations and Other (See Comments)    promethazine with DM, rapid hearbeat   Zithromax [Azithromycin] Palpitations and Other (See Comments)    Per OSH report, pt unable to recall Rapid heart rate    Chief Complaint  Patient presents with   Acute Visit    Fluctuating Blood Pressure and Dry Mouth. Requesting blood pressure to be checked before family coming into town.     Quality Metric Gaps    To discuss need for Zoster and Pneumonia Vaccine.      HPI: Patient is a 89 y.o. female to have blood pressure check.   Appears to have some anxiety regarding blood pressure.   Had dry mouth but this has improved.   Review of Systems:  Review of Systems  Constitutional:  Negative for chills, fever and weight loss.  HENT:  Negative for tinnitus.   Respiratory:  Negative for cough, sputum production and shortness of breath.   Cardiovascular:  Negative for chest pain, palpitations and leg swelling.  Gastrointestinal:  Negative for abdominal pain, constipation, diarrhea and heartburn.  Genitourinary:  Negative for dysuria, frequency and  urgency.  Musculoskeletal:  Negative for back pain, falls, joint pain and myalgias.  Skin: Negative.   Neurological:  Negative for dizziness and headaches.  Psychiatric/Behavioral:  Negative for depression and memory loss. The patient does not have insomnia.     Past Medical History:  Diagnosis Date   Colon polyps    Family history of breast cancer    History of pulmonary embolism 2011   Hx of melanoma of skin 2003   Right leg   Hypertension    Melanoma (HCC) 2003   right lower leg. [place]ated in [place].   Personal history of radiation therapy    Seizures (HCC) 1993   no meds since (approx) 2014   Vertigo    x1. R/T perforated ear drum prior to 2013   Past Surgical History:  Procedure Laterality Date   ABDOMINAL HYSTERECTOMY  05/2014   BREAST BIOPSY Right 08/20/2018   invasive mammary carcinoma/DCIS    BREAST LUMPECTOMY Right 10/10/2018   BREAST LUMPECTOMY WITH RADIOACTIVE SEED LOCALIZATION Right 10/10/2018   Procedure: BREAST LUMPECTOMY WITH RADIOACTIVE SEED LOCALIZATION;  Surgeon: Griselda Miner, MD;  Location: [facility] OR;  Service: General;  Laterality: Right;   CATARACT EXTRACTION W/PHACO Right 01/06/2018   Procedure: CATARACT EXTRACTION PHACO AND INTRAOCULAR LENS PLACEMENT (IOC) RIGHT;  Surgeon: Lockie Mola, MD;  Location: [facility];  Service: Ophthalmology;  Laterality: Right;   CATARACT EXTRACTION W/PHACO Left 01/29/2018   Procedure: CATARACT EXTRACTION PHACO AND INTRAOCULAR LENS PLACEMENT (IOC) LEFT;  Surgeon: Lockie Mola, MD;  Location: [facility];  Service: Ophthalmology;  Laterality:  Left;   left hand surgery  05/2011   s/p fall   OTHER SURGICAL HISTORY  2003   Removal of lymphoma from melonoma 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    Social History:   reports that she has never smoked. She has never used smokeless tobacco. She reports that she does not drink alcohol and does not use drugs.  Family History  Problem Relation Age of Onset   Heart disease Mother     Aortic aneurysm Father    Breast cancer Daughter 67   Breast cancer Niece 33    Medications: Patient's Medications  New Prescriptions   No medications on file  Previous Medications   ASPIRIN 81 MG TABLET    Take 81 mg by mouth daily.   CHOLECALCIFEROL (VITAMIN D3) 2000 UNITS TABS    Take 2,000 Units by mouth daily.    DENOSUMAB (PROLIA) 60 MG/ML SOSY INJECTION    Inject 60 mg into the skin every 6 (six) months.   KETOCONAZOLE (NIZORAL) 2 % CREAM    Apply between toes and around toenails every night.   LETROZOLE (FEMARA) 2.5 MG TABLET    TAKE 1 TABLET BY MOUTH EVERY DAY   LISINOPRIL (ZESTRIL) 30 MG TABLET    TAKE 1 TABLET BY MOUTH EVERY DAY   METOPROLOL TARTRATE (LOPRESSOR) 25 MG TABLET    TAKE 1 TABLET BY MOUTH TWICE A DAY   METRONIDAZOLE (METROCREAM) 0.75 % CREAM    Apply to nose every night for rosacea.   POLYETHYLENE GLYCOL (MIRALAX / GLYCOLAX) PACKET    Take 17 g by mouth every other day.   VITAMIN B-12 (CYANOCOBALAMIN) 1000 MCG TABLET    Take 1,000 mcg by mouth daily.   WARFARIN (COUMADIN) 5 MG TABLET    Take 5 mg by mouth daily. EXCEPT 7.5 mg daily on Monday and Thursday   WHEAT DEXTRIN (BENEFIBER PO)    Take 2 each by mouth every other day.  Modified Medications   No medications on file  Discontinued Medications   No medications on file    Physical Exam:  Vitals:   05/30/23 0942  BP: 138/86  Pulse: 61  Temp: (!) 97.5 F (36.4 C)  SpO2: 98%  Weight: 131 lb (59.4 kg)  Height: 5\' 2"  (1.575 m)   Body mass index is 23.96 kg/m. Wt Readings from Last 3 Encounters:  05/30/23 131 lb (59.4 kg)  05/20/23 131 lb (59.4 kg)  05/16/23 130 lb 8 oz (59.2 kg)    Physical Exam Constitutional:      Appearance: Normal appearance.  Cardiovascular:     Rate and Rhythm: Normal rate and regular rhythm.  Pulmonary:     Effort: Pulmonary effort is normal.     Breath sounds: Normal breath sounds.  Neurological:     Mental Status: She is alert. Mental status is at baseline.   Psychiatric:        Mood and Affect: Mood normal.     Labs reviewed: Basic Metabolic Panel: Recent Labs    07/01/22 2105 07/23/22 1305 01/01/23 1544 01/23/23 1321 04/05/23 1445  NA 138   < > 139 139 138  K 3.6   < > 4.2 4.4 4.3  CL 105   < > 106 103 105  CO2 24   < > 24 26 26   GLUCOSE 115*   < > 96 92 94  BUN 17   < > 23 21 16   CREATININE 0.74   < > 0.66 0.82 0.68  CALCIUM 9.4   < >  10.0 9.7 9.9  TSH 1.366  --   --   --   --    < > = values in this interval not displayed.   Liver Function Tests: Recent Labs    07/01/22 2105 07/23/22 1305 10/11/22 1429 01/23/23 1321 04/05/23 1445  AST 23 20 17 21 15   ALT 15 13 11 15 9   ALKPHOS 67 55  --  61  --   BILITOT 0.8 0.6 0.5 0.5 0.4  PROT 7.1 6.8 6.3 7.5 6.6  ALBUMIN 4.1 3.8  --  3.9  --    No results for input(s): "LIPASE", "AMYLASE" in the last 8760 hours. No results for input(s): "AMMONIA" in the last 8760 hours. CBC: Recent Labs    07/23/22 1305 10/11/22 1429 01/01/23 1544 01/23/23 1321 04/05/23 1445  WBC 5.6   < > 7.3 5.5 5.7  NEUTROABS 3.9  --   --  4.1  --   HGB 13.2   < > 14.4 13.2 13.3  HCT 39.6   < > 43.3 40.2 39.6  MCV 95.4   < > 94.5 96.2 95.2  PLT 194   < > 185 274 197   < > = values in this interval not displayed.   Lipid Panel: Recent Labs    10/11/22 1429 04/05/23 1445  CHOL 204* 203*  HDL 80 73  LDLCALC 111* 110*  TRIG 44 97  CHOLHDL 2.6 2.8   TSH: Recent Labs    07/01/22 2105  TSH 1.366   A1C: Lab Results  Component Value Date   HGBA1C 5.6 04/05/2023     Assessment/Plan 1. HTN, goal below 140/90 -Blood pressure well controlled, goal bp <140/90 Continue current medications and dietary modifications follow metabolic panel    Naiomy Watters K. Biagio Borg [facility] [phone]

## 2023-05-30 NOTE — Patient Instructions (Addendum)
If you just want your blood pressure check to call janci at the nurse number for [facility]  For medical questions or concerns or medications questions/concerns to call [phone] to schedule appt with Shanda Bumps or Dr Sydnee Cabal

## 2023-06-03 DIAGNOSIS — Z7901 Long term (current) use of anticoagulants: Secondary | ICD-10-CM | POA: Diagnosis not present

## 2023-06-03 LAB — PROTIME-INR
INR: 2.9 — ABNORMAL HIGH
Prothrombin Time: 28.8 s — ABNORMAL HIGH (ref 9.0–11.5)

## 2023-06-04 ENCOUNTER — Other Ambulatory Visit: Payer: Self-pay | Admitting: *Deleted

## 2023-06-04 DIAGNOSIS — Z7901 Long term (current) use of anticoagulants: Secondary | ICD-10-CM

## 2023-06-14 ENCOUNTER — Encounter: Payer: Self-pay | Admitting: Medical

## 2023-06-14 ENCOUNTER — Ambulatory Visit: Payer: Medicare Other | Attending: Medical | Admitting: Medical

## 2023-06-14 VITALS — BP 160/80 | HR 65 | Ht 62.0 in | Wt 130.0 lb

## 2023-06-14 DIAGNOSIS — R011 Cardiac murmur, unspecified: Secondary | ICD-10-CM | POA: Diagnosis not present

## 2023-06-14 DIAGNOSIS — Z7901 Long term (current) use of anticoagulants: Secondary | ICD-10-CM | POA: Diagnosis not present

## 2023-06-14 DIAGNOSIS — I493 Ventricular premature depolarization: Secondary | ICD-10-CM | POA: Insufficient documentation

## 2023-06-14 DIAGNOSIS — I350 Nonrheumatic aortic (valve) stenosis: Secondary | ICD-10-CM | POA: Diagnosis not present

## 2023-06-14 DIAGNOSIS — I10 Essential (primary) hypertension: Secondary | ICD-10-CM | POA: Diagnosis not present

## 2023-06-14 DIAGNOSIS — R058 Other specified cough: Secondary | ICD-10-CM | POA: Diagnosis not present

## 2023-06-14 DIAGNOSIS — I471 Supraventricular tachycardia, unspecified: Secondary | ICD-10-CM | POA: Insufficient documentation

## 2023-06-14 NOTE — Progress Notes (Signed)
Cardiology Office Note:    Date:  06/14/2023   ID:  Becky Farrell, DOB Aug 31, 1934, MRN [id number]  PCP:  Sharon Seller, NP  [facility] Cardiologist:  Lorine Bears, MD  [facility] Electrophysiologist:  None   Referring MD: Sharon Seller, NP   Chief Complaint: 1 year follow-up  History of Present Illness:    Becky Farrell is a 89 y.o. female with a hx of PVCs, SVT, palpitations, dizziness, PE on warfarin, osteoporosis, hypertension, right breast cancer status postlumpectomy and radiation, mild aortic stenosis presents for follow-up.  Her and her husband reside at [facility].   Seen by Dr. Bascom Levels 02/01/2021 reported mild exertional dyspnea and PVCs.  Murmur was noted on exam.  ZIO and echo were recommended.  Her ZIO showed dominantly normal sinus rhythm with occasional PACs with a 2.7 burden, PVCs with a 2.7 burden, 35 runs of SVT fastest 5 beats with 187 bpm and longest 16.5 seconds with a rate of 110 bpm.  Echo showed LVEF 50 to 55%, no wall motion abnormality, mildly elevated PASP, bilateral atrial mildly dilated, mild MR, mild AS.   Seen 03/10/2021 and overall feeling well with occasional palpitations.  Atenolol was transitioned to metoprolol   ABI's in 2022 showed no significant disease bilaterally.   The patient was last seen 06/2022 reporting balance issues. No changes were made.   Today, the patient reports she is doing well from a cardiac perspective. She reports a dry cough for the last few months. No recent fever or chills. No h/o allergies or asthma. BP is elevated, but she reports white coat syndrome. She denies chest pain, shortness of breath, or lower leg edema.   Past Medical History:  Diagnosis Date   Colon polyps    Family history of breast cancer    History of pulmonary embolism 2011   Hx of melanoma of skin 2003   Right leg   Hypertension    Melanoma (HCC) 2003   right lower leg. Treated in [place].   Personal history of radiation therapy     Seizures (HCC) 1993   no meds since (approx) 2014   Vertigo    x1. R/T perforated ear drum prior to 2013    Past Surgical History:  Procedure Laterality Date   ABDOMINAL HYSTERECTOMY  05/2014   BREAST BIOPSY Right 08/20/2018   invasive mammary carcinoma/DCIS    BREAST LUMPECTOMY Right 10/10/2018   BREAST LUMPECTOMY WITH RADIOACTIVE SEED LOCALIZATION Right 10/10/2018   Procedure: BREAST LUMPECTOMY WITH RADIOACTIVE SEED LOCALIZATION;  Surgeon: Griselda Miner, MD;  Location: [facility] OR;  Service: General;  Laterality: Right;   CATARACT EXTRACTION W/PHACO Right 01/06/2018   Procedure: CATARACT EXTRACTION PHACO AND INTRAOCULAR LENS PLACEMENT (IOC) RIGHT;  Surgeon: Lockie Mola, MD;  Location: [facility];  Service: Ophthalmology;  Laterality: Right;   CATARACT EXTRACTION W/PHACO Left 01/29/2018   Procedure: CATARACT EXTRACTION PHACO AND INTRAOCULAR LENS PLACEMENT (IOC) LEFT;  Surgeon: Lockie Mola, MD;  Location: [facility];  Service: Ophthalmology;  Laterality: Left;   left hand surgery  05/2011   s/p fall   OTHER SURGICAL HISTORY  2003   Removal of lymphoma from melonoma 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     Current Medications: Current Meds  Medication Sig   aspirin 81 MG tablet Take 81 mg by mouth daily.   Cholecalciferol (VITAMIN D3) 2000 UNITS TABS Take 2,000 Units by mouth daily.    denosumab (PROLIA) 60 MG/ML SOSY injection Inject 60 mg into  the skin every 6 (six) months.   ketoconazole (NIZORAL) 2 % cream Apply between toes and around toenails every night.   letrozole (FEMARA) 2.5 MG tablet TAKE 1 TABLET BY MOUTH EVERY DAY   lisinopril (ZESTRIL) 30 MG tablet TAKE 1 TABLET BY MOUTH EVERY DAY   metoprolol tartrate (LOPRESSOR) 25 MG tablet TAKE 1 TABLET BY MOUTH TWICE A DAY   metroNIDAZOLE (METROCREAM) 0.75 % cream Apply to nose every night for rosacea.   polyethylene glycol (MIRALAX / GLYCOLAX) packet Take 17 g by mouth every other day.   vitamin B-12 (CYANOCOBALAMIN) 1000  MCG tablet Take 1,000 mcg by mouth daily.   warfarin (COUMADIN) 5 MG tablet Take 5 mg by mouth daily. EXCEPT 7.5 mg daily on Monday and Thursday   Wheat Dextrin (BENEFIBER PO) Take 2 each by mouth every other day.     Allergies:   Clobetasol, Clobetasol propionate, Phenergan [promethazine hcl], Dextromethorphan, and Zithromax [azithromycin]   Social History   Socioeconomic History   Marital status: Married    Spouse name: Not on file   Number of children: Not on file   Years of education: Not on file   Highest education level: Not on file  Occupational History   Not on file  Tobacco Use   Smoking status: Never   Smokeless tobacco: Never  Vaping Use   Vaping status: Never Used  Substance and Sexual Activity   Alcohol use: No   Drug use: Never   Sexual activity: Never  Other Topics Concern   Not on file  Social History Narrative   Recently moved from [place] to [facility].   Daughter and grandchildren live in [place].   Has other children in [place].   Retired Runner, broadcasting/film/video.      Husband has been recovering from Prostate CA.   Social Determinants of Health   Financial Resource Strain: Not on file  Food Insecurity: Not on file  Transportation Needs: Not on file  Physical Activity: Not on file  Stress: Not on file  Social Connections: Not on file     Family History: The patient's family history includes Aortic aneurysm in her father; Breast cancer (age of onset: 79) in her niece; Breast cancer (age of onset: 36) in her daughter; Heart disease in her mother.  ROS:   Please see the history of present illness.     All other systems reviewed and are negative.  EKGs/Labs/Other Studies Reviewed:    The following studies were reviewed today:   Echo 01/2021  1. Left ventricular ejection fraction, by estimation, is 50 to 55%. The  left ventricle has low normal function. The left ventricle has no regional  wall motion abnormalities. Left ventricular diastolic parameters are   indeterminate.   2. Right ventricular systolic function is low normal. The right  ventricular size is normal. There is mildly elevated pulmonary artery  systolic pressure.   3. Left atrial size was mildly dilated.   4. Right atrial size was mildly dilated.   5. The mitral valve is degenerative. Mild mitral valve regurgitation.   6. The aortic valve is tricuspid. Aortic valve regurgitation is mild.  Mild aortic valve sclerosis is present, with no evidence of aortic valve  stenosis.   7. The inferior vena cava is normal in size with greater than 50%  respiratory variability, suggesting right atrial pressure of 3 mmHg.    Heart monitor 01/2021 Patient had a min HR of 44 bpm, max HR of 187 bpm, and avg HR  of 69 bpm.  Predominant underlying rhythm was Sinus Rhythm.  35 Supraventricular Tachycardia runs occurred, the run with the fastest interval lasting 5 beats with a max rate of 187 bpm, the longest lasting 16.5 secs with an avg rate of 110 bpm. Supraventricular Tachycardia was detected within +/- 45 seconds of symptomatic patient event(s). Occasional PACs with a burden of 2.7% and occasional PVCs with a burden of 2.1%.     ABIs 07/2021 Summary:  Right: Resting right ankle-brachial index is within normal range. No  evidence of significant right lower extremity arterial disease. The right  toe-brachial index is normal.   Left: Resting left ankle-brachial index is within normal range. No  evidence of significant left lower extremity arterial disease. The left  toe-brachial index is normal.   EKG:  EKG is ordered today.  The ekg ordered today demonstrates NSR 65bpm, Lad, PACs, nonspecific T wave changes  Recent Labs: 07/01/2022: TSH 1.366 04/05/2023: ALT 9; BUN 16; Creat 0.68; Hemoglobin 13.3; Platelets 197; Potassium 4.3; Sodium 138  Recent Lipid Panel    Component Value Date/Time   CHOL 203 (H) 04/05/2023 1445   TRIG 97 04/05/2023 1445   HDL 73 04/05/2023 1445   CHOLHDL 2.8 04/05/2023  1445   VLDL 16.0 04/26/2020 1503   LDLCALC 110 (H) 04/05/2023 1445   LDLDIRECT 158.5 11/04/2012 1105   Physical Exam:    VS:  BP (!) 160/80 (BP Location: Left Arm, Patient Position: Sitting, Cuff Size: Normal)   Pulse 65   Ht 5\' 2"  (1.575 m)   Wt 130 lb (59 kg)   SpO2 98%   BMI 23.78 kg/m     Wt Readings from Last 3 Encounters:  06/14/23 130 lb (59 kg)  05/30/23 131 lb (59.4 kg)  05/20/23 131 lb (59.4 kg)     GEN:  Well nourished, well developed in no acute distress HEENT: Normal NECK: No JVD; No carotid bruits LYMPHATICS: No lymphadenopathy CARDIAC: RRR, no murmurs, rubs, gallops RESPIRATORY:  +wheezing  ABDOMEN: Soft, non-tender, non-distended MUSCULOSKELETAL:  No edema; No deformity  SKIN: Warm and dry NEUROLOGIC:  Alert and oriented x 3 PSYCHIATRIC:  Normal affect   ASSESSMENT:    1. HTN, goal below 140/90   2. PVC (premature ventricular contraction)   3. SVT (supraventricular tachycardia)   4. Mild aortic stenosis   5. Murmur    PLAN:    In order of problems listed above:  Dry cough She reports dry cough the last few months. No h/o asthma or allergies. She denies fever or chills. She has wheezing on exam. I recommended follow-up with PCP. Can also consider stopping lisinopril as dry cough is a possible side affect.   HTN BP is high, but she reports white coat syndrome. BP last month was better. I will leave medications as they are, continue lisinopril and metoprolol.   Chronic anticoagulation Continue warfarin for h/o PE.   Mild AS/MR Echo in 2022 showed LVEF 50-55%, mild MR and mild AI. No significant murmur on exam. We can consider repeat echo at follow-up.   PVCs/PACs/pSVT No palpitations reported. Continue Lopressor 25mg  BID.   Disposition: Follow up in 3 month(s) with MD/APP    Signed, Evelen Vazguez David Stall, PA-C  06/14/2023 3:46 PM    [facility]

## 2023-06-14 NOTE — Patient Instructions (Signed)
Medication Instructions:  Your physician recommends that you continue on your current medications as directed. Please refer to the Current Medication list given to you today.  *If you need a refill on your cardiac medications before your next appointment, please call your pharmacy*  Lab Work: -None ordered If you have labs (blood work) drawn today and your tests are completely normal, you will receive your results only by: MyChart Message (if you have MyChart) OR A paper copy in the mail If you have any lab test that is abnormal or we need to change your treatment, we will call you to review the results.  Testing/Procedures: -None ordered  Follow-Up: At [facility], you and your health needs are our priority.  As part of our continuing mission to provide you with exceptional heart care, we have created designated Provider Care Teams.  These Care Teams include your primary Cardiologist (physician) and Advanced Practice Providers (APPs -  Physician Assistants and Nurse Practitioners) who all work together to provide you with the care you need, when you need it.  Your next appointment:   3 month(s)  Provider:   You may see Muhammad Arida, MD or one of the following Advanced Practice Providers on your designated Care Team:   Christopher Berge, NP Ryan Dunn, PA-C Cadence Furth, PA-C Sheri Hammock, NP    Other Instructions -None  

## 2023-06-17 DIAGNOSIS — H7202 Central perforation of tympanic membrane, left ear: Secondary | ICD-10-CM | POA: Diagnosis not present

## 2023-06-17 DIAGNOSIS — H90A32 Mixed conductive and sensorineural hearing loss, unilateral, left ear with restricted hearing on the contralateral side: Secondary | ICD-10-CM | POA: Diagnosis not present

## 2023-06-17 DIAGNOSIS — H6123 Impacted cerumen, bilateral: Secondary | ICD-10-CM | POA: Diagnosis not present

## 2023-06-28 ENCOUNTER — Other Ambulatory Visit: Payer: Self-pay | Admitting: Cardiovascular Disease

## 2023-07-01 DIAGNOSIS — Z7901 Long term (current) use of anticoagulants: Secondary | ICD-10-CM | POA: Diagnosis not present

## 2023-07-02 ENCOUNTER — Telehealth: Payer: Medicare Other | Admitting: *Deleted

## 2023-07-02 DIAGNOSIS — Z7901 Long term (current) use of anticoagulants: Secondary | ICD-10-CM

## 2023-07-02 NOTE — Telephone Encounter (Signed)
Patient called back and wanted to change her Lab Appointment to 08/12/2023. Stated that she prefers a Monday instead of a Thursday. Appointment Changed and added to order in [facility].   Patient also wanted the reading to her PT/INR. Values given.

## 2023-07-02 NOTE — Telephone Encounter (Signed)
Jonni Sanger, CMA 07/01/2023  4:34 PM EDT Back to Top    Spoke with patient and she is good with results wants to schedule 4 week F/U with Shanda Bumps to get her labs done again. Would need orders for that as the lab tech comes to her house in [place] at [facility] patient and [facility] that we will place orders for PT/INR to be drawn at home on 08/01/2023.   Orders placed and Lab order placed in [facility] for upcoming draw.

## 2023-08-12 DIAGNOSIS — Z7901 Long term (current) use of anticoagulants: Secondary | ICD-10-CM | POA: Diagnosis not present

## 2023-08-13 ENCOUNTER — Telehealth: Payer: Self-pay | Admitting: *Deleted

## 2023-08-13 DIAGNOSIS — Z7901 Long term (current) use of anticoagulants: Secondary | ICD-10-CM

## 2023-08-13 NOTE — Telephone Encounter (Signed)
Order placed and printed and placed in [facility] at [facility].

## 2023-08-13 NOTE — Telephone Encounter (Signed)
-----   Message from Sharon Seller sent at 08/12/2023  4:15 PM EDT ----- INR at goal, continue current regimen and recheck INR in 4 weeks at [facility]

## 2023-08-15 ENCOUNTER — Ambulatory Visit (INDEPENDENT_AMBULATORY_CARE_PROVIDER_SITE_OTHER): Payer: Medicare Other | Admitting: Podiatry

## 2023-08-15 DIAGNOSIS — B351 Tinea unguium: Secondary | ICD-10-CM | POA: Diagnosis not present

## 2023-08-15 DIAGNOSIS — D689 Coagulation defect, unspecified: Secondary | ICD-10-CM | POA: Diagnosis not present

## 2023-08-15 DIAGNOSIS — M79609 Pain in unspecified limb: Secondary | ICD-10-CM | POA: Diagnosis not present

## 2023-08-19 ENCOUNTER — Encounter: Payer: Self-pay | Admitting: Podiatry

## 2023-08-19 NOTE — Progress Notes (Signed)
Subjective:  Patient ID: Becky Farrell, female    DOB: 1934-10-16,  MRN: [id number]  89 y.o. female presents to clinic with  at risk foot care with h/o coagulation defect and painful thick toenails that are difficult to trim. Pain interferes with ambulation. Aggravating factors include wearing enclosed shoe gear. Pain is relieved with periodic professional debridement.   Patient states her daughter purchased her a pair of Occupational psychologist.  Chief Complaint  Patient presents with   Nail Problem    RFC,Referring Provider Sharon Seller, NP,lov:06/24         New problem(s): None   PCP is Sharon Seller, NP.  Allergies  Allergen Reactions   Clobetasol Other (See Comments)    vertigo   Clobetasol Propionate Other (See Comments)    This is ointment form UNSPECIFIED REACTION    Phenergan [Promethazine Hcl] Other (See Comments)    Rapid heart rate   Dextromethorphan Palpitations and Other (See Comments)    promethazine with DM, rapid hearbeat   Zithromax [Azithromycin] Palpitations and Other (See Comments)    Per OSH report, pt unable to recall Rapid heart rate    Review of Systems: Negative except as noted in the HPI.   Objective:  Becky Farrell is a pleasant 89 y.o. female WD, WN in NAD.Marland Kitchen  Vascular Examination: Vascular status intact b/l with palpable pedal pulses. CFT immediate b/l. No edema. No pain with calf compression b/l. Skin temperature gradient WNL b/l. No cyanosis or clubbing noted b/l LE.  Neurological Examination: Sensation grossly intact b/l with 10 gram monofilament. Vibratory sensation intact b/l.   Dermatological Examination: Pedal skin with normal turgor, texture and tone b/l. Toenails 1-5 b/l thick, discolored, elongated with subungual debris and pain on dorsal palpation. No hyperkeratotic lesions noted b/l.   Musculoskeletal Examination: Muscle strength 5/5 to b/l LE. No pain, crepitus or joint limitation noted with ROM bilateral LE. No  gross bony deformities bilaterally.  Radiographs: None  Last A1c:      Latest Ref Rng & Units 04/05/2023    2:45 PM  Hemoglobin A1C  Hemoglobin-A1c <5.7 % of total Hgb 5.6      Assessment:   1. Pain due to onychomycosis of nail   2. Coagulation defect (HCC)    Plan:  -Consent given for treatment as described below: -Examined patient. -Continue supportive shoe gear daily. -Mycotic toenails 1-5 bilaterally were debrided in length and girth with sterile nail nippers and dremel without incident. -Patient/POA to call should there be question/concern in the interim.  Return in about 3 months (around 11/14/2023).  [name], DPM

## 2023-08-22 ENCOUNTER — Ambulatory Visit
Admission: RE | Admit: 2023-08-22 | Discharge: 2023-08-22 | Disposition: A | Discharge status: Home / Self Care | Payer: Medicare Other | Source: Ambulatory Visit | Attending: Oncology | Admitting: Oncology

## 2023-08-22 DIAGNOSIS — Z1231 Encounter for screening mammogram for malignant neoplasm of breast: Secondary | ICD-10-CM | POA: Diagnosis present

## 2023-08-29 ENCOUNTER — Inpatient Hospital Stay: Payer: Medicare Other | Admitting: Oncology

## 2023-09-09 ENCOUNTER — Other Ambulatory Visit: Payer: Self-pay | Admitting: Internal Medicine

## 2023-09-09 ENCOUNTER — Other Ambulatory Visit: Payer: Self-pay | Admitting: Cardiovascular Disease

## 2023-09-09 DIAGNOSIS — Z7901 Long term (current) use of anticoagulants: Secondary | ICD-10-CM | POA: Diagnosis not present

## 2023-09-09 LAB — PROTIME-INR
INR: 1.5 — ABNORMAL HIGH
Prothrombin Time: 16 s — ABNORMAL HIGH (ref 9.0–11.5)

## 2023-09-10 NOTE — Telephone Encounter (Signed)
Refused lisinopril 30 mg because she is no longer under the care of Nicki Reaper, NP.  Has transferred to [facility] on 04/11/2023.

## 2023-09-12 ENCOUNTER — Telehealth: Payer: Medicare Other | Admitting: *Deleted

## 2023-09-12 DIAGNOSIS — Z7901 Long term (current) use of anticoagulants: Secondary | ICD-10-CM

## 2023-09-12 NOTE — Telephone Encounter (Signed)
-----   Message from Sharon Seller sent at 09/10/2023  3:02 PM EDT ----- Since she missed doses over the weekend will not adjust medication but have her follow up in INR at [facility] in 2 weeks.

## 2023-09-12 NOTE — Telephone Encounter (Signed)
Patient notified and agreed.  Appointment scheduled for 10/28 at patient's home.  Orders placed and printed and placed in [facility] at [facility].

## 2023-09-17 ENCOUNTER — Inpatient Hospital Stay: Payer: Medicare Other | Attending: Oncology | Admitting: Oncology

## 2023-09-17 ENCOUNTER — Encounter: Payer: Self-pay | Admitting: Oncology

## 2023-09-17 ENCOUNTER — Inpatient Hospital Stay: Payer: Medicare Other

## 2023-09-17 ENCOUNTER — Ambulatory Visit: Payer: Medicare Other | Attending: Cardiovascular Disease | Admitting: Cardiovascular Disease

## 2023-09-17 VITALS — BP 159/86 | HR 76 | Temp 98.1°F | Resp 18 | Wt 131.6 lb

## 2023-09-17 DIAGNOSIS — Z170 Estrogen receptor positive status [ER+]: Secondary | ICD-10-CM | POA: Diagnosis not present

## 2023-09-17 DIAGNOSIS — Z85820 Personal history of malignant melanoma of skin: Secondary | ICD-10-CM | POA: Diagnosis not present

## 2023-09-17 DIAGNOSIS — M810 Age-related osteoporosis without current pathological fracture: Secondary | ICD-10-CM

## 2023-09-17 DIAGNOSIS — Z1231 Encounter for screening mammogram for malignant neoplasm of breast: Secondary | ICD-10-CM | POA: Diagnosis not present

## 2023-09-17 DIAGNOSIS — Z803 Family history of malignant neoplasm of breast: Secondary | ICD-10-CM | POA: Diagnosis not present

## 2023-09-17 DIAGNOSIS — Z923 Personal history of irradiation: Secondary | ICD-10-CM | POA: Diagnosis not present

## 2023-09-17 DIAGNOSIS — Z7901 Long term (current) use of anticoagulants: Secondary | ICD-10-CM | POA: Diagnosis not present

## 2023-09-17 DIAGNOSIS — Z86711 Personal history of pulmonary embolism: Secondary | ICD-10-CM | POA: Insufficient documentation

## 2023-09-17 DIAGNOSIS — Z79811 Long term (current) use of aromatase inhibitors: Secondary | ICD-10-CM | POA: Insufficient documentation

## 2023-09-17 DIAGNOSIS — C50211 Malignant neoplasm of upper-inner quadrant of right female breast: Secondary | ICD-10-CM | POA: Diagnosis not present

## 2023-09-17 DIAGNOSIS — Z0000 Encounter for general adult medical examination without abnormal findings: Secondary | ICD-10-CM | POA: Diagnosis not present

## 2023-09-17 LAB — CMP (CANCER CENTER ONLY)
ALT: 15 U/L (ref 0–44)
AST: 21 U/L (ref 15–41)
Albumin: 4 g/dL (ref 3.5–5.0)
Alkaline Phosphatase: 63 U/L (ref 38–126)
Anion gap: 6 (ref 5–15)
BUN: 21 mg/dL (ref 8–23)
CO2: 28 mmol/L (ref 22–32)
Calcium: 9.9 mg/dL (ref 8.9–10.3)
Chloride: 103 mmol/L (ref 98–111)
Creatinine: 0.76 mg/dL (ref 0.44–1.00)
GFR, Estimated: >60 mL/min (ref >60)
Glucose, Bld: 94 mg/dL (ref 70–99)
Potassium: 4.2 mmol/L (ref 3.5–5.1)
Sodium: 137 mmol/L (ref 135–145)
Total Bilirubin: 0.7 mg/dL (ref 0.3–1.2)
Total Protein: 6.8 g/dL (ref 6.5–8.1)

## 2023-09-17 LAB — CBC WITH DIFFERENTIAL (CANCER CENTER ONLY)
Abs Immature Granulocytes: 0.02 10*3/uL (ref 0.00–0.07)
Basophils Absolute: 0 10*3/uL (ref 0.0–0.1)
Basophils Relative: 1 %
Eosinophils Absolute: 0.1 10*3/uL (ref 0.0–0.5)
Eosinophils Relative: 2 %
HCT: 39.3 % (ref 36.0–46.0)
Hemoglobin: 13 g/dL (ref 12.0–15.0)
Immature Granulocytes: 0 %
Lymphocytes Relative: 18 %
Lymphs Abs: 1 10*3/uL (ref 0.7–4.0)
MCH: 31.8 pg (ref 26.0–34.0)
MCHC: 33.1 g/dL (ref 30.0–36.0)
MCV: 96.1 fL (ref 80.0–100.0)
Monocytes Absolute: 0.6 10*3/uL (ref 0.1–1.0)
Monocytes Relative: 11 %
Neutro Abs: 3.6 10*3/uL (ref 1.7–7.7)
Neutrophils Relative %: 68 %
Platelet Count: 168 10*3/uL (ref 150–400)
RBC: 4.09 MIL/uL (ref 3.87–5.11)
RDW: 13 % (ref 11.5–15.5)
WBC Count: 5.3 10*3/uL (ref 4.0–10.5)
nRBC: 0 % (ref 0.0–0.2)

## 2023-09-17 MED ORDER — DENOSUMAB 60 MG/ML ~~LOC~~ SOSY
60.0000 mg | PREFILLED_SYRINGE | Freq: Once | SUBCUTANEOUS | Status: AC
  Administered 2023-09-17: 60 mg via SUBCUTANEOUS
  Filled 2023-09-17: qty 1

## 2023-09-17 NOTE — Assessment & Plan Note (Addendum)
#   Stage T1aNx right breast cancer [ invasive ductal carcinoma with mucinous features] -08/2018  s/p surgery and radiation Labs were reviewed and discussed with patient.   08/2023 Finished 5 years of letrozole 2.5 mg daily  Continue annual mammogram.-September 2025

## 2023-09-17 NOTE — Assessment & Plan Note (Addendum)
#   Osteoporosis, history of chronic AI use Patient previously declines calcium supplementation.  Proceed with Prolia today.  recommend patient to take calcium 1200 mg daily supplementation.

## 2023-09-17 NOTE — Progress Notes (Signed)
Hematology/Oncology Progress note [phone] [phone]    REASON FOR VISIT  Becky Farrell is a 89 y.o. female presents for follow up of stage I right breast cancer and osteoporosis- on prolia  ASSESSMENT & PLAN:   Carcinoma of upper-inner quadrant of right breast in female, estrogen receptor positive (HCC) # Stage T1aNx right breast cancer [ invasive ductal carcinoma with mucinous features] -08/2018  s/p surgery and radiation Labs were reviewed and discussed with patient.   08/2023 Finished 5 years of letrozole 2.5 mg daily  Continue annual mammogram.-September 2025  Osteoporosis  # Osteoporosis, history of chronic AI use Patient previously declines calcium supplementation.  Proceed with Prolia today.  recommend patient to take calcium 1200 mg daily supplementation.   History of pulmonary embolus (PE) continue long term anticoagulation with coumadin. Follows up with coumadin clinic.   Orders Placed This Encounter  Procedures   MM 3D SCREENING MAMMOGRAM BILATERAL BREAST    Standing Status:   Future    Standing Expiration Date:   09/16/2024    Order Specific Question:   Reason for Exam (SYMPTOM  OR DIAGNOSIS REQUIRED)    Answer:   Breast cancer    Order Specific Question:   Preferred imaging location?    Answer:   [facility]   CBC with Differential (Cancer Center Only)    Standing Status:   Future    Number of Occurrences:   1    Standing Expiration Date:   09/16/2024   CMP (Cancer Center only)    Standing Status:   Future    Number of Occurrences:   1    Standing Expiration Date:   09/16/2024   Basic Metabolic Panel - Cancer Center Only    Standing Status:   Future    Standing Expiration Date:   09/16/2024   CBC with Differential (Cancer Center Only)    Standing Status:   Future    Standing Expiration Date:   09/16/2024   CMP (Cancer Center only)    Standing Status:   Future    Standing Expiration Date:   09/16/2024   Follow up in 6  months lab + prolia  12 months lab MD + prolia..  All questions were answered. The patient knows to call the clinic with any problems, questions or concerns.  Rickard Patience, MD, PhD [facility] Hematology Oncology 09/17/2023   PERTINENT ONCOLOGY HISTORY Patient previously followed up by Dr.Corcoran, patient switched care to me on 07/12/21 Extensive medical record review was performed by me  stage T1aNx right breast cancer s/p lumpectomy on 10/10/2018. Pathology revealed a 0.4 cm grade I invasive ductal carcinoma with mucinous features.  There was intermediate grade DCIS.  Margins were uninvolved (1.0 cm posterior margin).  There were fibrocystic changes including apocrine metaplasia.  Tumor was ER + (100%), PR + (90%), and Her2/neu-.  Ki67 was 10%.  Pathologic stage was T1aNx.  CA27.29 was 14.1 on 09/08/2018.    12/16/2018 - 02/03/2019.She received radiation. She received 5040 cGy in 28 fractions with a scar boost of another 1400 cGy using electron beam.   06/09/2019.  Patient is started on letrozole.  #Osteoporosis 07/31/2018 DEXA revealed osteoporosis with a T-score of -3.4 in the right forearm radius and -1.9 in the right femoral neck.   08/09/2020 DEXA revealed osteoporosis with a T-score of -3.2 in the forearm radius.  Right femoral neck revealed osteopenia with a T-score of -2.0.   07/07/2019 patient started Prolia every 6 months.  #History of  pulmonary embolism x2.  Initial event occurred in 2011.  Recurrent pulmonary embolism after holding anticoagulation for 1 month for planned colonoscopy.  Patient is currently on lifelong anticoagulation with Coumadin.  Managed by primary care provider.  #Family history of breast cancer-[daughter and niece].  [employer] genetic testing was negative.  #Patient has history of seizure and previously on Tegretol and is currently off.  She has not had any recent seizure/spells she follows up with neurology Dr. Malvin Johns. chronic ataxia/sensory loss/difficulty  walking.  Neurology note was reviewed.    ER visit on 7/30/ 2023 due to high blood pressure, headache. 07/01/2022 CT head without contrast showed chronic atrophic and ischemic changes 07/02/2022, CT angio head and neck with and without contrast negative for large vessel occlusion or other emergent findings.  Chronic findings please refer to CT reports. 06/22/2022, MRI brain without contrast showed no acute intracranial abnormality.  Mild to moderate chronic microvascular ischemic disease. . INTERVAL HISTORY Becky Farrell is a 89 y.o. female who has above history reviewed by me today presents for follow up visit for management of history of stage I right breast cancer, osteoporosis Patient finished 5 years of letrozole, off treatment.  Today patient reports no breast concerns.   Past Medical History:  Diagnosis Date   Colon polyps    Family history of breast cancer    History of pulmonary embolism 2011   Hx of melanoma of skin 2003   Right leg   Hypertension    Melanoma (HCC) 2003   right lower leg. [place]ated in [place].   Personal history of radiation therapy    Seizures (HCC) 1993   no meds since (approx) 2014   Vertigo    x1. R/T perforated ear drum prior to 2013    Past Surgical History:  Procedure Laterality Date   ABDOMINAL HYSTERECTOMY  05/2014   BREAST BIOPSY Right 08/20/2018   invasive mammary carcinoma/DCIS    BREAST LUMPECTOMY WITH RADIOACTIVE SEED LOCALIZATION Right 10/10/2018   Procedure: BREAST LUMPECTOMY WITH RADIOACTIVE SEED LOCALIZATION;  Surgeon: Griselda Miner, MD;  Location: [facility] OR;  Service: General;  Laterality: Right;   CATARACT EXTRACTION W/PHACO Right 01/06/2018   Procedure: CATARACT EXTRACTION PHACO AND INTRAOCULAR LENS PLACEMENT (IOC) RIGHT;  Surgeon: Lockie Mola, MD;  Location: [facility];  Service: Ophthalmology;  Laterality: Right;   CATARACT EXTRACTION W/PHACO Left 01/29/2018   Procedure: CATARACT EXTRACTION PHACO AND INTRAOCULAR LENS  PLACEMENT (IOC) LEFT;  Surgeon: Lockie Mola, MD;  Location: [facility];  Service: Ophthalmology;  Laterality: Left;   left hand surgery  05/2011   s/p fall   OTHER SURGICAL HISTORY  2003   Removal of lymphoma from melonoma 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     Family History  Problem Relation Age of Onset   Heart disease Mother    Aortic aneurysm Father    Breast cancer Daughter 15   Breast cancer Niece 67    Social History:  reports that she has never smoked. She has never used smokeless tobacco. She reports that she does not drink alcohol and does not use drugs.  Patient moved from [place] to [place].  Her daughter and grandchildren live in [place].  She has other children in [place].   She is a retired Runner, broadcasting/film/video. 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   Allergies:  Allergies  Allergen Reactions   Clobetasol Other (See Comments)    vertigo   Clobetasol Propionate Other (See Comments)    This is ointment form UNSPECIFIED REACTION  Phenergan [Promethazine Hcl] Other (See Comments)    Rapid heart rate   Dextromethorphan Palpitations and Other (See Comments)    promethazine with DM, rapid hearbeat   Zithromax [Azithromycin] Palpitations and Other (See Comments)    Per OSH report, pt unable to recall Rapid heart rate    Current Medications: Current Outpatient Medications  Medication Sig Dispense Refill   aspirin 81 MG tablet Take 81 mg by mouth daily.     Cholecalciferol (VITAMIN D3) 2000 UNITS TABS Take 2,000 Units by mouth daily.      denosumab (PROLIA) 60 MG/ML SOSY injection Inject 60 mg into the skin every 6 (six) months.     ketoconazole (NIZORAL) 2 % cream Apply between toes and around toenails every night. 60 g 11   lisinopril (ZESTRIL) 30 MG tablet TAKE 1 TABLET BY MOUTH EVERY DAY 90 tablet 1   metoprolol tartrate (LOPRESSOR) 25 MG tablet TAKE 1 TABLET BY MOUTH TWICE A DAY 180 tablet 0   metroNIDAZOLE (METROCREAM) 0.75 % cream Apply to nose every night for rosacea.  45 g 1   polyethylene glycol (MIRALAX / GLYCOLAX) packet Take 17 g by mouth every other day.     vitamin B-12 (CYANOCOBALAMIN) 1000 MCG tablet Take 1,000 mcg by mouth daily.     warfarin (COUMADIN) 5 MG tablet Take 5 mg by mouth daily. EXCEPT 7.5 mg daily on Monday and Thursday     Wheat Dextrin (BENEFIBER PO) Take 2 each by mouth every other day.     No current facility-administered medications for this visit.   Review of Systems  Constitutional:  Negative for chills, diaphoresis, fever, malaise/fatigue and weight loss (up 1 lb).  HENT:  Positive for hearing loss. Negative for congestion, ear discharge, ear pain, nosebleeds, sinus pain, sore throat and tinnitus.   Eyes: Negative.  Negative for blurred vision, double vision and photophobia.  Respiratory:  Negative for cough, hemoptysis, sputum production and shortness of breath.        Recurrent PE x 2.  Cardiovascular: Negative.  Negative for chest pain, palpitations, orthopnea, leg swelling and PND.  Gastrointestinal: Negative.  Negative for abdominal pain, blood in stool, constipation, diarrhea, heartburn, melena, nausea and vomiting.  Genitourinary: Negative.  Negative for dysuria, frequency, hematuria and urgency.  Musculoskeletal:  Positive for joint pain. Negative for back pain, falls, myalgias and neck pain.       Osteoporosis.  Skin: Negative.  Negative for itching and rash.  Neurological:  Positive for tingling (hands and sometimes feet). Negative for dizziness, tremors, sensory change, speech change, focal weakness, seizures (history), weakness and headaches.       Chronic balance issues.  Endo/Heme/Allergies:  Does not bruise/bleed easily (on Coumadin).  Psychiatric/Behavioral: Negative.  Negative for depression and memory loss. The patient is not nervous/anxious and does not have insomnia.   All other systems reviewed and are negative.  Performance status (ECOG): 1  Vitals Blood pressure (!) 159/86, pulse 76, temperature  98.1 F (36.7 C), resp. rate 18, weight 131 lb 9.6 oz (59.7 kg).   Physical Exam Vitals and nursing note reviewed.  Constitutional:      General: She is not in acute distress.    Appearance: She is well-developed. She is not diaphoretic.  HENT:     Head: Normocephalic and atraumatic.  Eyes:     General: No scleral icterus. Neck:     Vascular: No JVD.  Cardiovascular:     Rate and Rhythm: Normal rate.  Pulmonary:  Effort: Pulmonary effort is normal. No respiratory distress.     Breath sounds: Normal breath sounds. No wheezing or rales.  Abdominal:     General: There is no distension.  Musculoskeletal:        General: No swelling or tenderness.  Skin:    General: Skin is warm and dry.  Neurological:     Mental Status: She is alert and oriented to person, place, and time.  Psychiatric:        Mood and Affect: Mood normal.   .   Labs    Latest Ref Rng & Units 09/17/2023    2:33 PM 04/05/2023    2:45 PM 01/23/2023    1:21 PM  CBC  WBC 4.0 - 10.5 K/uL 5.3  5.7  5.5   Hemoglobin 12.0 - 15.0 g/dL 16.1  09.6  04.5   Hematocrit 36.0 - 46.0 % 39.3  39.6  40.2   Platelets 150 - 400 K/uL 168  197  274       Latest Ref Rng & Units 09/17/2023    2:33 PM 04/05/2023    2:45 PM 01/23/2023    1:21 PM  CMP  Glucose 70 - 99 mg/dL 94  94  92   BUN 8 - 23 mg/dL 21  16  21    Creatinine 0.44 - 1.00 mg/dL 4.09  8.11  9.14   Sodium 135 - 145 mmol/L 137  138  139   Potassium 3.5 - 5.1 mmol/L 4.2  4.3  4.4   Chloride 98 - 111 mmol/L 103  105  103   CO2 22 - 32 mmol/L 28  26  26    Calcium 8.9 - 10.3 mg/dL 9.9  9.9  9.7   Total Protein 6.5 - 8.1 g/dL 6.8  6.6  7.5   Total Bilirubin 0.3 - 1.2 mg/dL 0.7  0.4  0.5   Alkaline Phos 38 - 126 U/L 63   61   AST 15 - 41 U/L 21  15  21    ALT 0 - 44 U/L 15  9  15

## 2023-09-17 NOTE — Assessment & Plan Note (Signed)
continue long term anticoagulation with coumadin. Follows up with coumadin clinic.

## 2023-09-17 NOTE — Progress Notes (Deleted)
Cardiology Office Note   Date:  09/17/2023   ID:  Becky Farrell, DOB 1934-02-20, MRN [id number]  PCP:  Sharon Seller, NP  Cardiologist:   Lorine Bears, MD   No chief complaint on file.     History of Present Illness: Becky Farrell is a 89 y.o. female who presents for evaluation of PVCs and dizziness.  She reports prolonged history of palpitations at least since 2013 and has been on atenolol.  She has history of right breast cancer status post lumpectomy and radiation therapy.  She is on long-term anticoagulation with warfarin for previous history of pulmonary embolism.  Other medical problems include osteoporosis and essential hypertension. She lives with her husband at [facility].  He is one of my patients and they both moved from [place]. She denies chest pain but has mild exertional dyspnea and fatigue.  She seems to have some component of whitecoat syndrome as her blood pressure at home is much better than in the office. She was seen recently in the oncology clinic and was found to have an irregular pulse.  An EKG was done which showed evidence of PVCs.   Past Medical History:  Diagnosis Date   Colon polyps    Family history of breast cancer    History of pulmonary embolism 2011   Hx of melanoma of skin 2003   Right leg   Hypertension    Melanoma (HCC) 2003   right lower leg. [place] in [place].   Personal history of radiation therapy    Seizures (HCC) 1993   no meds since (approx) 2014   Vertigo    x1. R/T perforated ear drum prior to 2013    Past Surgical History:  Procedure Laterality Date   ABDOMINAL HYSTERECTOMY  05/2014   BREAST BIOPSY Right 08/20/2018   invasive mammary carcinoma/DCIS    BREAST LUMPECTOMY WITH RADIOACTIVE SEED LOCALIZATION Right 10/10/2018   Procedure: BREAST LUMPECTOMY WITH RADIOACTIVE SEED LOCALIZATION;  Surgeon: Griselda Miner, MD;  Location: [facility] OR;  Service: General;  Laterality: Right;   CATARACT EXTRACTION W/PHACO Right  01/06/2018   Procedure: CATARACT EXTRACTION PHACO AND INTRAOCULAR LENS PLACEMENT (IOC) RIGHT;  Surgeon: Lockie Mola, MD;  Location: [facility];  Service: Ophthalmology;  Laterality: Right;   CATARACT EXTRACTION W/PHACO Left 01/29/2018   Procedure: CATARACT EXTRACTION PHACO AND INTRAOCULAR LENS PLACEMENT (IOC) LEFT;  Surgeon: Lockie Mola, MD;  Location: [facility];  Service: Ophthalmology;  Laterality: Left;   left hand surgery  05/2011   s/p fall   OTHER SURGICAL HISTORY  2003   Removal of lymphoma from melonoma 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      Current Outpatient Medications  Medication Sig Dispense Refill   aspirin 81 MG tablet Take 81 mg by mouth daily.     Cholecalciferol (VITAMIN D3) 2000 UNITS TABS Take 2,000 Units by mouth daily.      denosumab (PROLIA) 60 MG/ML SOSY injection Inject 60 mg into the skin every 6 (six) months.     ketoconazole (NIZORAL) 2 % cream Apply between toes and around toenails every night. 60 g 11   lisinopril (ZESTRIL) 30 MG tablet TAKE 1 TABLET BY MOUTH EVERY DAY 90 tablet 1   metoprolol tartrate (LOPRESSOR) 25 MG tablet TAKE 1 TABLET BY MOUTH TWICE A DAY 180 tablet 0   metroNIDAZOLE (METROCREAM) 0.75 % cream Apply to nose every night for rosacea. 45 g 1   polyethylene glycol (MIRALAX / GLYCOLAX) packet Take 17 g  by mouth every other day.     vitamin B-12 (CYANOCOBALAMIN) 1000 MCG tablet Take 1,000 mcg by mouth daily.     warfarin (COUMADIN) 5 MG tablet Take 5 mg by mouth daily. EXCEPT 7.5 mg daily on Monday and Thursday     Wheat Dextrin (BENEFIBER PO) Take 2 each by mouth every other day.     No current facility-administered medications for this visit.    Allergies:   Clobetasol, Clobetasol propionate, Phenergan [promethazine hcl], Dextromethorphan, and Zithromax [azithromycin]    Social History:  The patient  reports that she has never smoked. She has never used smokeless tobacco. She reports that she does not drink alcohol and  does not use drugs.   Family History:  The patient's family history includes Aortic aneurysm in her father; Breast cancer (age of onset: 17) in her niece; Breast cancer (age of onset: 61) in her daughter; Heart disease in her mother.    ROS:  Please see the history of present illness.   Otherwise, review of systems are positive for none.   All other systems are reviewed and negative.    PHYSICAL EXAM: VS:  There were no vitals taken for this visit. , BMI There is no height or weight on file to calculate BMI. GEN: Well nourished, well developed, in no acute distress  HEENT: normal  Neck: no JVD, carotid bruits, or masses Cardiac: RRR; no  rubs, or gallops,no edema .  2 out of 6 systolic murmur in the aortic area which is early to mid peaking Respiratory:  clear to auscultation bilaterally, normal work of breathing GI: soft, nontender, nondistended, + BS MS: no deformity or atrophy  Skin: warm and dry, no rash Neuro:  Strength and sensation are intact Psych: euthymic mood, full affect   EKG:  EKG is ordered today. The ekg ordered today demonstrates normal sinus rhythm with no significant ST or T wave changes.   Recent Labs: 04/05/2023: ALT 9; BUN 16; Creat 0.68; Hemoglobin 13.3; Platelets 197; Potassium 4.3; Sodium 138    Lipid Panel    Component Value Date/Time   CHOL 203 (H) 04/05/2023 1445   TRIG 97 04/05/2023 1445   HDL 73 04/05/2023 1445   CHOLHDL 2.8 04/05/2023 1445   VLDL 16.0 04/26/2020 1503   LDLCALC 110 (H) 04/05/2023 1445   LDLDIRECT 158.5 11/04/2012 1105      Wt Readings from Last 3 Encounters:  09/17/23 131 lb 9.6 oz (59.7 kg)  06/14/23 130 lb (59 kg)  05/30/23 131 lb (59.4 kg)           02/09/2021    2:55 PM  PAD Screen  Previous PAD dx? No  Previous surgical procedure? No  Pain with walking? No  Feet/toe relief with dangling? No  Painful, non-healing ulcers? No  Extremities discolored? No      ASSESSMENT AND PLAN:  1.  Symptomatic PVCs:  She had recent worsening of palpitations and was noted to have frequent PVCs on EKG.  These seem to have improved at least by today's physical exam and EKG.  Nonetheless given her symptoms of intermittent dizziness and palpitations, I am going to obtain a 2-week outpatient monitor.  Continue atenolol for now.  2.  Cardiac murmur suggestive of aortic stenosis: Given her dizziness, I am going to obtain an echocardiogram.  I suspect that she at least has mild to moderate aortic stenosis.  3.  Essential hypertension, blood pressure is elevated here but I reviewed home blood pressure readings which  were mostly in the normal range.  Continue to monitor for now.    Disposition:   FU with me in 1 month  Signed,  Lorine Bears, MD  09/17/2023 2:06 PM    [facility]

## 2023-09-26 ENCOUNTER — Other Ambulatory Visit: Payer: Self-pay | Admitting: Internal Medicine

## 2023-09-26 DIAGNOSIS — Z23 Encounter for immunization: Secondary | ICD-10-CM | POA: Diagnosis not present

## 2023-09-27 NOTE — Telephone Encounter (Signed)
Requested Prescriptions  Pending Prescriptions Disp Refills   lisinopril (ZESTRIL) 30 MG tablet [Pharmacy Med Name: LISINOPRIL 30 MG TABLET] 90 tablet 0    Sig: TAKE 1 TABLET BY MOUTH EVERY DAY     Cardiovascular:  ACE Inhibitors Failed - 09/26/2023  2:43 PM      Failed - Last BP in normal range    BP Readings from Last 1 Encounters:  09/17/23 (!) 159/86         Passed - Cr in normal range and within 180 days    Creatinine  Date Value Ref Range Status  09/17/2023 0.76 0.44 - 1.00 mg/dL Final   Creat  Date Value Ref Range Status  04/05/2023 0.68 0.60 - 0.95 mg/dL Final         Passed - K in normal range and within 180 days    Potassium  Date Value Ref Range Status  09/17/2023 4.2 3.5 - 5.1 mmol/L Final  04/22/2013 3.6 3.5 - 5.1 mmol/L Final         Passed - Patient is not pregnant      Passed - Valid encounter within last 6 months    Recent Outpatient Visits           5 months ago History of pulmonary embolus (PE)   [place] [facility] [place], Salvadore Oxford, NP   8 months ago HTN, goal below 140/90   [facility] [place], Salvadore Oxford, NP   8 months ago Lightheadedness   [facility] [place], Salvadore Oxford, NP   11 months ago Bilateral carotid artery stenosis   [facility], Salvadore Oxford, NP   1 year ago Dizziness   [facility] [place], Salvadore Oxford, NP       Future Appointments             In 2 weeks Janyth Contes, Janene Harvey, NP [facility]   In 2 weeks Fransico Michael, Cadence H, PA-C  [facility]

## 2023-09-30 DIAGNOSIS — Z7901 Long term (current) use of anticoagulants: Secondary | ICD-10-CM | POA: Diagnosis not present

## 2023-09-30 LAB — PROTIME-INR
INR: 3.7 — ABNORMAL HIGH
Prothrombin Time: 35.9 s — ABNORMAL HIGH (ref 9.0–11.5)

## 2023-10-01 NOTE — Telephone Encounter (Signed)
Patient returned call and verbalized understanding to take 2.5 mg today.

## 2023-10-01 NOTE — Telephone Encounter (Signed)
Copied and pasted:  Sharon Seller, NP  to [facility]     09/30/23  4:32 PM  Her INR is high, would recommend for just today to take warfarin 5 mg (instead of the 7.5mg ) and then continue her usual dosing. If she has already taken dose today to half her tablet tomorrow and take 2.5 mg warfarin. May, [name], CMA  to Sharon Seller, NP     09/30/23  4:21 PM  Patient notified. Patient is taking Warfarin 5mg  every day except Monday and Thursday she takes 7.5mg . Stated that eating has changed, stated that she is eating more salads, vegetables and fruit. Does NOT want to change anything until her appointment with you on 10/15/23. Sharon Seller, NP  to May, Anita A, [facility]     09/30/23  4:04 PM  INR is now elevated, how has she been taking her medication? Has she started any new medication or stopped medication she was taking? Any dietary changes?

## 2023-10-03 ENCOUNTER — Telehealth: Payer: Self-pay

## 2023-10-03 NOTE — Addendum Note (Signed)
Addended by: Nelda Severe A on: 10/03/2023 11:00 AM   Modules accepted: Orders

## 2023-10-03 NOTE — Telephone Encounter (Signed)
Patient called to discuss labs with Synetta Fail. I told patient I would be happy yo assist but she stated Synetta Fail and her spoke previously.

## 2023-10-03 NOTE — Telephone Encounter (Signed)
Patient called requesting PT/INR lab draw.   Per Jessica--To draw 10/14/2023, before her appointment on 11/12.  Patient notified and agreed and would like it drawn at Home. Order placed and printed and placed in [facility] [employer].

## 2023-10-07 DIAGNOSIS — H7202 Central perforation of tympanic membrane, left ear: Secondary | ICD-10-CM | POA: Diagnosis not present

## 2023-10-07 DIAGNOSIS — H6122 Impacted cerumen, left ear: Secondary | ICD-10-CM | POA: Diagnosis not present

## 2023-10-14 DIAGNOSIS — Z7901 Long term (current) use of anticoagulants: Secondary | ICD-10-CM | POA: Diagnosis not present

## 2023-10-14 LAB — PROTIME-INR
INR: 2.7 — ABNORMAL HIGH
Prothrombin Time: 27.3 s — ABNORMAL HIGH (ref 9.0–11.5)

## 2023-10-15 ENCOUNTER — Ambulatory Visit: Payer: Medicare Other | Admitting: Nurse Practitioner

## 2023-10-15 ENCOUNTER — Encounter: Payer: Self-pay | Admitting: Nurse Practitioner

## 2023-10-15 VITALS — BP 138/88 | HR 89 | Temp 97.6°F | Ht 62.0 in | Wt 131.0 lb

## 2023-10-15 DIAGNOSIS — Z7901 Long term (current) use of anticoagulants: Secondary | ICD-10-CM

## 2023-10-15 DIAGNOSIS — I10 Essential (primary) hypertension: Secondary | ICD-10-CM

## 2023-10-15 DIAGNOSIS — Z86711 Personal history of pulmonary embolism: Secondary | ICD-10-CM | POA: Diagnosis not present

## 2023-10-15 DIAGNOSIS — M810 Age-related osteoporosis without current pathological fracture: Secondary | ICD-10-CM

## 2023-10-15 DIAGNOSIS — K5904 Chronic idiopathic constipation: Secondary | ICD-10-CM

## 2023-10-15 NOTE — Progress Notes (Unsigned)
Careteam: Patient Care Team: Sharon Seller, NP as PCP - General (Geriatric Medicine) Iran Ouch, MD as PCP - Cardiology (Cardiology) Carmina Miller, MD as Radiation Oncologist (Radiation Oncology) Rickard Patience, MD as Consulting Physician (Oncology) Lockie Mola, MD as Referring Physician (Ophthalmology)  PLACE OF SERVICE:  [facility]  Advanced Directive information Does Patient Have a Medical Advance Directive?: Yes, Type of Advance Directive: Healthcare Power of [place];Living will, Does patient want to make changes to medical advance directive?: No - Patient declined  Allergies  Allergen Reactions   Clobetasol Other (See Comments)    vertigo   Clobetasol Propionate Other (See Comments)    This is ointment form UNSPECIFIED REACTION    Phenergan [Promethazine Hcl] Other (See Comments)    Rapid heart rate   Dextromethorphan Palpitations and Other (See Comments)    promethazine with DM, rapid hearbeat   Zithromax [Azithromycin] Palpitations and Other (See Comments)    Per OSH report, pt unable to recall Rapid heart rate    Chief Complaint  Patient presents with   Medical Management of Chronic Issues    Medical Management of Chronic Issues. 6 Month follow up   Quality Metric Gaps    To discuss need for Zoster and Pneumonia.      HPI: Patient is a 89 y.o. female for routine following up  Lab Results  Component Value Date   INR 2.7 (H) 10/14/2023   INR 3.7 (H) 09/30/2023   INR 1.5 (H) 09/09/2023   PROTIME 32.2 (A) 11/30/2021   INR at goal on last lab, no unusual bleeding or bruising. Likes to continue to have Monday am INR.  She does not drive. She can not hear well so has decided not to drive.   She wants her husband to be seen at [facility]   Blood pressure well controlled.   Reports she is aging, wants to maintain independence as long as possible.  Went to PT for a few months several years ago and was doing exercises but got  away from it.  No longer can jump out of bed like she used to- realizing there is more to do to maintain normalcy.   Having more incontinence at night- wears a pad. She leaks.   She is on miralax every other day with benefiber  Adjusting to her bowel regimen.   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 No significant pain.   Daughter got her a cane if she needs it- does not feel like it would be benefit at this time.    Review of Systems:  Review of Systems  Constitutional:  Negative for chills, fever and weight loss.  HENT:  Negative for tinnitus.   Respiratory:  Negative for cough, sputum production and shortness of breath.   Cardiovascular:  Negative for chest pain, palpitations and leg swelling.  Gastrointestinal:  Negative for abdominal pain, constipation, diarrhea and heartburn.  Genitourinary:  Negative for dysuria, frequency and urgency.  Musculoskeletal:  Negative for back pain, falls, joint pain and myalgias.  Skin: Negative.   Neurological:  Negative for dizziness and headaches.  Psychiatric/Behavioral:  Negative for depression and memory loss. The patient does not have insomnia.     Past Medical History:  Diagnosis Date   Colon polyps    Family history of breast cancer    History of pulmonary embolism 2011   Hx of melanoma of skin 2003   Right leg  Hypertension    Melanoma (HCC) 2003   right lower leg. [place]ated in [place].   Personal history of radiation therapy    Seizures (HCC) 1993   no meds since (approx) 2014   Vertigo    x1. R/T perforated ear drum prior to 2013   Past Surgical History:  Procedure Laterality Date   ABDOMINAL HYSTERECTOMY  05/2014   BREAST BIOPSY Right 08/20/2018   invasive mammary carcinoma/DCIS    BREAST LUMPECTOMY WITH RADIOACTIVE SEED LOCALIZATION Right 10/10/2018   Procedure: BREAST LUMPECTOMY WITH RADIOACTIVE SEED LOCALIZATION;  Surgeon: Griselda Miner, MD;  Location: [facility] OR;  Service:  General;  Laterality: Right;   CATARACT EXTRACTION W/PHACO Right 01/06/2018   Procedure: CATARACT EXTRACTION PHACO AND INTRAOCULAR LENS PLACEMENT (IOC) RIGHT;  Surgeon: Lockie Mola, MD;  [facility];  Service: Ophthalmology;  Laterality: Right;   CATARACT EXTRACTION W/PHACO Left 01/29/2018   Procedure: CATARACT EXTRACTION PHACO AND INTRAOCULAR LENS PLACEMENT (IOC) LEFT;  Surgeon: Lockie Mola, MD;  Location: [facility];  Service: Ophthalmology;  Laterality: Left;   left hand surgery  05/2011   s/p fall   OTHER SURGICAL HISTORY  2003   Removal of lymphoma from melonoma 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    Social History:   reports that she has never smoked. She has never used smokeless tobacco. She reports that she does not drink alcohol and does not use drugs.  Family History  Problem Relation Age of Onset   Heart disease Mother    Aortic aneurysm Father    Breast cancer Daughter 1   Breast cancer Niece 38    Medications: Patient's Medications  New Prescriptions   No medications on file  Previous Medications   ASPIRIN 81 MG TABLET    Take 81 mg by mouth daily.   CHOLECALCIFEROL (VITAMIN D3) 2000 UNITS TABS    Take 2,000 Units by mouth daily.    DENOSUMAB (PROLIA) 60 MG/ML SOSY INJECTION    Inject 60 mg into the skin every 6 (six) months.   KETOCONAZOLE (NIZORAL) 2 % CREAM    Apply between toes and around toenails every night.   LISINOPRIL (ZESTRIL) 30 MG TABLET    TAKE 1 TABLET BY MOUTH EVERY DAY   METOPROLOL TARTRATE (LOPRESSOR) 25 MG TABLET    TAKE 1 TABLET BY MOUTH TWICE A DAY   METRONIDAZOLE (METROCREAM) 0.75 % CREAM    Apply to nose every night for rosacea.   POLYETHYLENE GLYCOL (MIRALAX / GLYCOLAX) PACKET    Take 17 g by mouth every other day.   VITAMIN B-12 (CYANOCOBALAMIN) 1000 MCG TABLET    Take 1,000 mcg by mouth daily.   WARFARIN (COUMADIN) 5 MG TABLET    Take 5 mg by mouth daily. EXCEPT 7.5 mg daily on Monday and Thursday   WHEAT DEXTRIN  (BENEFIBER PO)    Take 2 each by mouth every other day.  Modified Medications   No medications on file  Discontinued Medications   No medications on file    Physical Exam:  Vitals:   10/15/23 0922  BP: 138/88  Pulse: 89  Temp: 97.6 F (36.4 C)  SpO2: 96%  Weight: 131 lb (59.4 kg)  Height: 5\' 2"  (1.575 m)   Body mass index is 23.96 kg/m. Wt Readings from Last 3 Encounters:  10/15/23 131 lb (59.4 kg)  09/17/23 131 lb 9.6 oz (59.7 kg)  06/14/23 130 lb (59 kg)    Physical Exam Constitutional:      General: She  is not in acute distress.    Appearance: She is well-developed. She is not diaphoretic.  HENT:     Head: Normocephalic and atraumatic.     Mouth/Throat:     Pharynx: No oropharyngeal exudate.  Eyes:     Conjunctiva/sclera: Conjunctivae normal.     Pupils: Pupils are equal, round, and reactive to light.  Cardiovascular:     Rate and Rhythm: Normal rate and regular rhythm.     Heart sounds: Normal heart sounds.  Pulmonary:     Effort: Pulmonary effort is normal.     Breath sounds: Normal breath sounds.  Abdominal:     General: Bowel sounds are normal.     Palpations: Abdomen is soft.  Musculoskeletal:     Cervical back: Normal range of motion and neck supple.     Right lower leg: No edema.     Left lower leg: No edema.  Skin:    General: Skin is warm and dry.  Neurological:     Mental Status: She is alert.  Psychiatric:        Mood and Affect: Mood normal.     Labs reviewed: Basic Metabolic Panel: Recent Labs    01/23/23 1321 04/05/23 1445 09/17/23 1433  NA 139 138 137  K 4.4 4.3 4.2  CL 103 105 103  CO2 26 26 28   GLUCOSE 92 94 94  BUN 21 16 21   CREATININE 0.82 0.68 0.76  CALCIUM 9.7 9.9 9.9   Liver Function Tests: Recent Labs    01/23/23 1321 04/05/23 1445 09/17/23 1433  AST 21 15 21   ALT 15 9 15   ALKPHOS 61  --  63  BILITOT 0.5 0.4 0.7  PROT 7.5 6.6 6.8  ALBUMIN 3.9  --  4.0   No results for input(s): "LIPASE", "AMYLASE" in  the last 8760 hours. No results for input(s): "AMMONIA" in the last 8760 hours. CBC: Recent Labs    01/23/23 1321 04/05/23 1445 09/17/23 1433  WBC 5.5 5.7 5.3  NEUTROABS 4.1  --  3.6  HGB 13.2 13.3 13.0  HCT 40.2 39.6 39.3  MCV 96.2 95.2 96.1  PLT 274 197 168   Lipid Panel: Recent Labs    04/05/23 1445  CHOL 203*  HDL 73  LDLCALC 110*  TRIG 97  CHOLHDL 2.8   TSH: No results for input(s): "TSH" in the last 8760 hours. A1C: Lab Results  Component Value Date   HGBA1C 5.6 04/05/2023     Assessment/Plan 1. Chronic anticoagulation Due to hx of PE/DVT, continues on coumadin No abnormal bruising or bleeding  2. HTN, goal below 140/90 -Blood pressure well controlled, goal bp <140/90 Continue current medications and dietary modifications follow metabolic panel  3. History of pulmonary embolus (PE) -continues on coumadin take 5 mg by mouth daily. EXCEPT 7.5 mg daily on Monday and Thursday  - Protime-INR in 4 weeks schedule  4. Chronic idiopathic constipation Controlled on current regimen.   5. Age-related osteoporosis without current pathological fracture Continues on prolia with cal and vit d with weight bearing exercises as tolerated.   6 months for routine follow up at [facility]  [facility] K[facility]ag[facility] [phone]

## 2023-10-17 ENCOUNTER — Encounter: Payer: Self-pay | Admitting: Medical

## 2023-10-17 ENCOUNTER — Ambulatory Visit: Payer: Medicare Other | Attending: Cardiovascular Disease | Admitting: Medical

## 2023-10-17 VITALS — BP 140/80 | HR 66 | Ht 62.0 in | Wt 132.2 lb

## 2023-10-17 DIAGNOSIS — Z7901 Long term (current) use of anticoagulants: Secondary | ICD-10-CM | POA: Diagnosis not present

## 2023-10-17 DIAGNOSIS — R011 Cardiac murmur, unspecified: Secondary | ICD-10-CM | POA: Insufficient documentation

## 2023-10-17 DIAGNOSIS — I493 Ventricular premature depolarization: Secondary | ICD-10-CM | POA: Insufficient documentation

## 2023-10-17 DIAGNOSIS — I471 Supraventricular tachycardia, unspecified: Secondary | ICD-10-CM | POA: Diagnosis not present

## 2023-10-17 DIAGNOSIS — I350 Nonrheumatic aortic (valve) stenosis: Secondary | ICD-10-CM | POA: Diagnosis not present

## 2023-10-17 DIAGNOSIS — I10 Essential (primary) hypertension: Secondary | ICD-10-CM | POA: Diagnosis not present

## 2023-10-17 DIAGNOSIS — M79605 Pain in left leg: Secondary | ICD-10-CM | POA: Insufficient documentation

## 2023-10-17 DIAGNOSIS — M79604 Pain in right leg: Secondary | ICD-10-CM | POA: Insufficient documentation

## 2023-10-17 NOTE — Progress Notes (Signed)
Cardiology Office Note:    Date:  10/18/2023   ID:  Becky Farrell, DOB 1934-09-03, MRN [id number]  PCP:  Sharon Seller, NP  [facility] Cardiologist:  Lorine Bears, MD  [facility] Electrophysiologist:  None   Referring MD: Sharon Seller, NP   Chief Complaint: 3 month follow-up  History of Present Illness:    Becky Farrell is a 89 y.o. female with a hx of PVCs, SVT, palpitations, dizziness, PE on warfarin, osteoporosis, hypertension, right breast cancer status postlumpectomy and radiation, mild aortic stenosis presents for follow-up.  Her and her husband reside at [facility].   Seen by Dr. Kirke Corin 02/01/2021 reported mild exertional dyspnea and PVCs.  Murmur was noted on exam.  ZIO and echo were recommended.  Her ZIO showed dominantly normal sinus rhythm with occasional PACs with a 2.7 burden, PVCs with a 2.7 burden, 35 runs of SVT fastest 5 beats with 187 bpm and longest 16.5 seconds with a rate of 110 bpm.  Echo showed LVEF 50 to 55%, no wall motion abnormality, mildly elevated PASP, bilateral atrial mildly dilated, mild MR, mild AS.   Seen 03/10/2021 and overall feeling well with occasional palpitations.  Atenolol was transitioned to metoprolol   ABI's in 2022 showed no significant disease bilaterally.   Patient was last seen in July 2024 and was overall doing well from a cardiac perspective.  She reported dry cough for the last few months.  Consider stopping lisinopril, but decided to monitor symptoms.  Today,    Past Medical History:  Diagnosis Date   Colon polyps    Family history of breast cancer    History of pulmonary embolism 2011   Hx of melanoma of skin 2003   Right leg   Hypertension    Melanoma (HCC) 2003   right lower leg. [place]ated in [place].   Personal history of radiation therapy    Seizures (HCC) 1993   no meds since (approx) 2014   Vertigo    x1. R/T perforated ear drum prior to 2013    Past Surgical History:  Procedure Laterality Date    ABDOMINAL HYSTERECTOMY  05/2014   BREAST BIOPSY Right 08/20/2018   invasive mammary carcinoma/DCIS    BREAST LUMPECTOMY WITH RADIOACTIVE SEED LOCALIZATION Right 10/10/2018   Procedure: BREAST LUMPECTOMY WITH RADIOACTIVE SEED LOCALIZATION;  Surgeon: Griselda Miner, MD;  [facility]: [facility] OR;  Service: General;  Laterality: Right;   CATARACT EXTRACTION W/PHACO Right 01/06/2018   Procedure: CATARACT EXTRACTION PHACO AND INTRAOCULAR LENS PLACEMENT (IOC) RIGHT;  Surgeon: Lockie Mola, MD;  [facility];  Service: Ophthalmology;  Laterality: Right;   CATARACT EXTRACTION W/PHACO Left 01/29/2018   Procedure: CATARACT EXTRACTION PHACO AND INTRAOCULAR LENS PLACEMENT (IOC) LEFT;  Surgeon: Lockie Mola, MD;  Location: [facility];  Service: Ophthalmology;  Laterality: Left;   left hand surgery  05/2011   s/p fall   OTHER SURGICAL HISTORY  2003   Removal of lymphoma from melonoma 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     Current Medications: Current Meds  Medication Sig   aspirin 81 MG tablet Take 81 mg by mouth daily.   Cholecalciferol (VITAMIN D3) 2000 UNITS TABS Take 2,000 Units by mouth daily.    denosumab (PROLIA) 60 MG/ML SOSY injection Inject 60 mg into the skin every 6 (six) months.   ketoconazole (NIZORAL) 2 % cream Apply between toes and around toenails every night.   lisinopril (ZESTRIL) 30 MG tablet TAKE 1 TABLET BY MOUTH EVERY DAY  metoprolol tartrate (LOPRESSOR) 25 MG tablet TAKE 1 TABLET BY MOUTH TWICE A DAY   metroNIDAZOLE (METROCREAM) 0.75 % cream Apply to nose every night for rosacea.   polyethylene glycol (MIRALAX / GLYCOLAX) packet Take 17 g by mouth every other day.   vitamin B-12 (CYANOCOBALAMIN) 1000 MCG tablet Take 1,000 mcg by mouth daily.   warfarin (COUMADIN) 5 MG tablet Take 5 mg by mouth daily. EXCEPT 7.5 mg daily on Monday and Thursday   Wheat Dextrin (BENEFIBER PO) Take 2 each by mouth every other day.     Allergies:   Clobetasol, Clobetasol  propionate, Phenergan [promethazine hcl], Dextromethorphan, and Zithromax [azithromycin]   Social History   Socioeconomic History   Marital status: Married    Spouse name: Not on file   Number of children: Not on file   Years of education: Not on file   Highest education level: Not on file  Occupational History   Not on file  Tobacco Use   Smoking status: Never   Smokeless tobacco: Never  Vaping Use   Vaping status: Never Used  Substance and Sexual Activity   Alcohol use: No   Drug use: Never   Sexual activity: Never  Other Topics Concern   Not on file  Social History Narrative   Recently moved from [place] to [facility].   Daughter and grandchildren live in [place].   Has other children in [place].   Retired Runner, broadcasting/film/video.      Husband has been recovering from Prostate CA.   Social Determinants of Health   Financial Resource Strain: Not on file  Food Insecurity: Not on file  Transportation Needs: Not on file  Physical Activity: Not on file  Stress: Not on file  Social Connections: Not on file     Family History: The patient's family history includes Aortic aneurysm in her father; Breast cancer (age of onset: 12) in her niece; Breast cancer (age of onset: 25) in her daughter; Heart disease in her mother.  ROS:   Please see the history of present illness.     All other systems reviewed and are negative.  EKGs/Labs/Other Studies Reviewed:    The following studies were reviewed today:   Echo 01/2021  1. Left ventricular ejection fraction, by estimation, is 50 to 55%. The  left ventricle has low normal function. The left ventricle has no regional  wall motion abnormalities. Left ventricular diastolic parameters are  indeterminate.   2. Right ventricular systolic function is low normal. The right  ventricular size is normal. There is mildly elevated pulmonary artery  systolic pressure.   3. Left atrial size was mildly dilated.   4. Right atrial size was mildly  dilated.   5. The mitral valve is degenerative. Mild mitral valve regurgitation.   6. The aortic valve is tricuspid. Aortic valve regurgitation is mild.  Mild aortic valve sclerosis is present, with no evidence of aortic valve  stenosis.   7. The inferior vena cava is normal in size with greater than 50%  respiratory variability, suggesting right atrial pressure of 3 mmHg.    Heart monitor 01/2021 Patient had a min HR of 44 bpm, max HR of 187 bpm, and avg HR of 69 bpm.  Predominant underlying rhythm was Sinus Rhythm.  35 Supraventricular Tachycardia runs occurred, the run with the fastest interval lasting 5 beats with a max rate of 187 bpm, the longest lasting 16.5 secs with an avg rate of 110 bpm. Supraventricular Tachycardia was detected within +/- 45  seconds of symptomatic patient event(s). Occasional PACs with a burden of 2.7% and occasional PVCs with a burden of 2.1%.     ABIs 07/2021 Summary:  Right: Resting right ankle-brachial index is within normal range. No  evidence of significant right lower extremity arterial disease. The right  toe-brachial index is normal.   Left: Resting left ankle-brachial index is within normal range. No  evidence of significant left lower extremity arterial disease. The left  toe-brachial index is normal.   EKG:  EKG is ordered today.  The ekg ordered today demonstrates NSR 66bpm, nonspecific St/T wave changes  Recent Labs: 09/17/2023: ALT 15; BUN 21; Creatinine 0.76; Hemoglobin 13.0; Platelet Count 168; Potassium 4.2; Sodium 137  Recent Lipid Panel    Component Value Date/Time   CHOL 203 (H) 04/05/2023 1445   TRIG 97 04/05/2023 1445   HDL 73 04/05/2023 1445   CHOLHDL 2.8 04/05/2023 1445   VLDL 16.0 04/26/2020 1503   LDLCALC 110 (H) 04/05/2023 1445   LDLDIRECT 158.5 11/04/2012 1105    Physical Exam:    VS:  BP (!) 140/80 (BP Location: Left Arm, Patient Position: Sitting, Cuff Size: Normal)   Pulse 66   Ht 5\' 2"  (1.575 m)   Wt 132 lb 4 oz  (60 kg)   SpO2 98%   BMI 24.19 kg/m     Wt Readings from Last 3 Encounters:  10/17/23 132 lb 4 oz (60 kg)  10/15/23 131 lb (59.4 kg)  09/17/23 131 lb 9.6 oz (59.7 kg)     GEN:  Well nourished, well developed in no acute distress HEENT: Normal NECK: No JVD; No carotid bruits LYMPHATICS: No lymphadenopathy CARDIAC: RRR, no murmurs, rubs, gallops RESPIRATORY:  Clear to auscultation without rales, wheezing or rhonchi  ABDOMEN: Soft, non-tender, non-distended MUSCULOSKELETAL:  No edema; No deformity  SKIN: Warm and dry NEUROLOGIC:  Alert and oriented x 3 PSYCHIATRIC:  Normal affect   ASSESSMENT:    1. Essential hypertension   2. Chronic anticoagulation   3. Mild aortic stenosis   4. SVT (supraventricular tachycardia) (HCC)   5. PVC (premature ventricular contraction)   6. Murmur   7. Pain in both lower extremities    PLAN:    In order of problems listed above:   HTN BP is mildly elevated today, continue lisinopril and Lopressor.   Chronic anticoagulation The patient is on warfarin for h/o PE.    Mild AS/MR Echo in 2022 showed LVEF 50-55%, mild MR and mild AI. No significant murmur on exam.    PVC/PAC/pSVT She denies palpitations. Continue Lopressor 25mg BID.    Leg pain ABIs in 2022 was normal. She reports pain in her hamstring area, worse after doing exercise. Pulses are good on exam. No erythema or edema noted. Suspect leg pain MSK in nature, esp since she has been doing PT/exercises.    Disposition: Follow up in 3 month(s) with MD/APP    Signed, Lachandra Dettmann David Stall, PA-C  10/18/2023 10:27 AM    [facility]

## 2023-10-17 NOTE — Patient Instructions (Signed)
Medication Instructions:  Your Physician recommend you continue on your current medication as directed.     *If you need a refill on your cardiac medications before your next appointment, please call your pharmacy*   Lab Work: None ordered.   If you have labs (blood work) drawn today and your tests are completely normal, you will receive your results only by: MyChart Message (if you have MyChart) OR A paper copy in the mail If you have any lab test that is abnormal or we need to change your treatment, we will call you to review the results.   Testing/Procedures: None ordered today.    Follow-Up: At [facility], you and your health needs are our priority.  As part of our continuing mission to provide you with exceptional heart care, we have created designated Provider Care Teams.  These Care Teams include your primary Cardiologist (physician) and Advanced Practice Providers (APPs -  Physician Assistants and Nurse Practitioners) who all work together to provide you with the care you need, when you need it.  We recommend signing up for the patient portal called "MyChart".  Sign up information is provided on this After Visit Summary.  MyChart is used to connect with patients for Virtual Visits (Telemedicine).  Patients are able to view lab/test results, encounter notes, upcoming appointments, etc.  Non-urgent messages can be sent to your provider as well.   To learn more about what you can do with MyChart, go to [email or web address].    Your next appointment:   3 month(s)  Provider:   You may see Lorine Bears, MD or one of the following Advanced Practice Providers on your designated Care Team:   Nicolasa Ducking, NP Eula Listen, PA-C Cadence Fransico Michael, PA-C Charlsie Quest, NP Carlos Levering, NP

## 2023-10-17 NOTE — Progress Notes (Signed)
Cardiology Office Note:    Date:  10/17/2023   ID:  Becky Farrell, DOB [birth year], MRN [id number]  PCP:  Sharon Seller, NP  [facility] Cardiologist:  Lorine Bears, MD  [facility] Electrophysiologist:  None   Referring MD: Sharon Seller, NP   Chief Complaint: 3 month follow-up  History of Present Illness:    Becky Farrell is a 89 y.o. female with a hx of PVCs, SVT, palpitations, dizziness, PE on warfarin, osteoporosis, hypertension, right breast cancer status postlumpectomy and radiation, mild aortic stenosis presents for follow-up.  Her and her husband reside at [facility].   Seen by Dr. Kirke Corin 02/01/2021 reported mild exertional dyspnea and PVCs.  Murmur was noted on exam.  ZIO and echo were recommended.  Her ZIO showed dominantly normal sinus rhythm with occasional PACs with a 2.7 burden, PVCs with a 2.7 burden, 35 runs of SVT fastest 5 beats with 187 bpm and longest 16.5 seconds with a rate of 110 bpm.  Echo showed LVEF 50 to 55%, no wall motion abnormality, mildly elevated PASP, bilateral atrial mildly dilated, mild MR, mild AS.   Seen 03/10/2021 and overall feeling well with occasional palpitations.  Atenolol was transitioned to metoprolol   ABI's in 2022 showed no significant disease bilaterally.   Patient was last seen in July 2024 and was overall doing well from a cardiac perspective.  She reported dry cough for the last few months.  Consider stopping lisinopril, but decided to monitor symptoms.  Today, the patient reports cough has stopped. She is taking lisinopril and metoprolol. She is doing exercise a couple times a week and has noticed hamstring pain. She has some numbness and tingling in her feet and she also in her hands. She feels balance is worse. She denies chest pain or SOB.   Past Medical History:  Diagnosis Date   Colon polyps    Family history of breast cancer    History of pulmonary embolism 2011   Hx of melanoma of skin 2003   Right leg    Hypertension    Melanoma (HCC) 2003   right lower leg. Treated in [place].   Personal history of radiation therapy    Seizures (HCC) 1993   no meds since (approx) 2014   Vertigo    x1. R/T perforated ear drum prior to 2013    Past Surgical History:  Procedure Laterality Date   ABDOMINAL HYSTERECTOMY  05/2014   BREAST BIOPSY Right 08/20/2018   invasive mammary carcinoma/DCIS    BREAST LUMPECTOMY WITH RADIOACTIVE SEED LOCALIZATION Right 10/10/2018   Procedure: BREAST LUMPECTOMY WITH RADIOACTIVE SEED LOCALIZATION;  Surgeon: Griselda Miner, MD;  Location: [facility] OR;  Service: General;  Laterality: Right;   CATARACT EXTRACTION W/PHACO Right 01/06/2018   Procedure: CATARACT EXTRACTION PHACO AND INTRAOCULAR LENS PLACEMENT (IOC) RIGHT;  Surgeon: Lockie Mola, MD;  Location: [facility];  Service: Ophthalmology;  Laterality: Right;   CATARACT EXTRACTION W/PHACO Left 01/29/2018   Procedure: CATARACT EXTRACTION PHACO AND INTRAOCULAR LENS PLACEMENT (IOC) LEFT;  Surgeon: Lockie Mola, MD;  Location: [facility];  Service: Ophthalmology;  Laterality: Left;   left hand surgery  05/2011   s/p fall   OTHER SURGICAL HISTORY  2003   Removal of lymphoma from melonoma 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     Current Medications: Current Meds  Medication Sig   aspirin 81 MG tablet Take 81 mg by mouth daily.   Cholecalciferol (VITAMIN D3) 2000 UNITS TABS Take 2,000 Units  by mouth daily.    denosumab (PROLIA) 60 MG/ML SOSY injection Inject 60 mg into the skin every 6 (six) months.   ketoconazole (NIZORAL) 2 % cream Apply between toes and around toenails every night.   lisinopril (ZESTRIL) 30 MG tablet TAKE 1 TABLET BY MOUTH EVERY DAY   metoprolol tartrate (LOPRESSOR) 25 MG tablet TAKE 1 TABLET BY MOUTH TWICE A DAY   metroNIDAZOLE (METROCREAM) 0.75 % cream Apply to nose every night for rosacea.   polyethylene glycol (MIRALAX / GLYCOLAX) packet Take 17 g by mouth every other day.   vitamin B-12  (CYANOCOBALAMIN) 1000 MCG tablet Take 1,000 mcg by mouth daily.   warfarin (COUMADIN) 5 MG tablet Take 5 mg by mouth daily. EXCEPT 7.5 mg daily on Monday and Thursday   Wheat Dextrin (BENEFIBER PO) Take 2 each by mouth every other day.     Allergies:   Clobetasol, Clobetasol propionate, Phenergan [promethazine hcl], Dextromethorphan, and Zithromax [azithromycin]   Social History   Socioeconomic History   Marital status: Married    Spouse name: Not on file   Number of children: Not on file   Years of education: Not on file   Highest education level: Not on file  Occupational History   Not on file  Tobacco Use   Smoking status: Never   Smokeless tobacco: Never  Vaping Use   Vaping status: Never Used  Substance and Sexual Activity   Alcohol use: No   Drug use: Never   Sexual activity: Never  Other Topics Concern   Not on file  Social History Narrative   Recently moved from [place] to [facility].   Daughter and grandchildren live in [place].   Has other children in [place].   Retired Runner, broadcasting/film/video.      Husband has been recovering from Prostate CA.   Social Determinants of Health   Financial Resource Strain: Not on file  Food Insecurity: Not on file  Transportation Needs: Not on file  Physical Activity: Not on file  Stress: Not on file  Social Connections: Not on file     Family History: The patient's family history includes Aortic aneurysm in her father; Breast cancer (age of onset: 49) in her niece; Breast cancer (age of onset: 52) in her daughter; Heart disease in her mother.  ROS:   Please see the history of present illness.     All other systems reviewed and are negative.  EKGs/Labs/Other Studies Reviewed:    The following studies were reviewed today:  ABIs 2022 Summary:  Right: Resting right ankle-brachial index is within normal range. No  evidence of significant right lower extremity arterial disease. The right  toe-brachial index is normal.   Left: Resting  left ankle-brachial index is within normal range. No  evidence of significant left lower extremity arterial disease. The left  toe-brachial index is normal.   Echo 01/2021  1. Left ventricular ejection fraction, by estimation, is 50 to 55%. The  left ventricle has low normal function. The left ventricle has no regional  wall motion abnormalities. Left ventricular diastolic parameters are  indeterminate.   2. Right ventricular systolic function is low normal. The right  ventricular size is normal. There is mildly elevated pulmonary artery  systolic pressure.   3. Left atrial size was mildly dilated.   4. Right atrial size was mildly dilated.   5. The mitral valve is degenerative. Mild mitral valve regurgitation.   6. The aortic valve is tricuspid. Aortic valve regurgitation is mild.  Mild aortic valve sclerosis is present, with no evidence of aortic valve  stenosis.   7. The inferior vena cava is normal in size with greater than 50%  respiratory variability, suggesting right atrial pressure of 3 mmHg.   EKG:  EKG is ordered today.  The ekg ordered today demonstrates NSR 66bpm, LAD, mod LVH  Recent Labs: 09/17/2023: ALT 15; BUN 21; Creatinine 0.76; Hemoglobin 13.0; Platelet Count 168; Potassium 4.2; Sodium 137  Recent Lipid Panel    Component Value Date/Time   CHOL 203 (H) 04/05/2023 1445   TRIG 97 04/05/2023 1445   HDL 73 04/05/2023 1445   CHOLHDL 2.8 04/05/2023 1445   VLDL 16.0 04/26/2020 1503   LDLCALC 110 (H) 04/05/2023 1445   LDLDIRECT 158.5 11/04/2012 1105   Physical Exam:    VS:  BP (!) 140/80 (BP Location: Left Arm, Patient Position: Sitting, Cuff Size: Normal)   Pulse 66   Ht 5\' 2"  (1.575 m)   Wt 132 lb 4 oz (60 kg)   SpO2 98%   BMI 24.19 kg/m     Wt Readings from Last 3 Encounters:  10/17/23 132 lb 4 oz (60 kg)  10/15/23 131 lb (59.4 kg)  09/17/23 131 lb 9.6 oz (59.7 kg)     GEN:  Well nourished, well developed in no acute distress HEENT: Normal NECK: No  JVD; No carotid bruits LYMPHATICS: No lymphadenopathy CARDIAC: RRR, no murmurs, rubs, gallops RESPIRATORY:  Clear to auscultation without rales, wheezing or rhonchi  ABDOMEN: Soft, non-tender, non-distended MUSCULOSKELETAL:  No edema; No deformity  SKIN: Warm and dry NEUROLOGIC:  Alert and oriented x 3 PSYCHIATRIC:  Normal affect   ASSESSMENT:    1. Essential hypertension   2. Chronic anticoagulation   3. Mild aortic stenosis   4. SVT (supraventricular tachycardia) (HCC)   5. PVC (premature ventricular contraction)   6. Murmur   7. Pain in both lower extremities    PLAN:    In order of problems listed above:  HTN BP is mildly elevated today, continue lisinopril and Lopressor.  Chronic anticoagulation The patient is on warfarin for h/o PE.   Mild AS/MR Echo in 2022 showed LVEF 50-55%, mild MR and mild AI. No significant murmur on exam.   PVC/PAC/pSVT She denies palpitations. Continue Lopressor 25mg BID.   Leg pain ABIs in 2022 was normal. She reports pain in her hamstring area, worse after doing exercise. Pulses are good on exam. No erythema or edema noted. Suspect leg pain MSK in nature, esp since she has been doing PT/exercises.  Disposition: Follow up in 3 month(s) with MD/APP    Signed, Cavan Bearden David Stall, PA-C  10/17/2023 5:03 PM    [facility]

## 2023-10-24 ENCOUNTER — Telehealth: Payer: Medicare Other | Admitting: Nurse Practitioner

## 2023-10-24 ENCOUNTER — Other Ambulatory Visit: Payer: Self-pay | Admitting: Nurse Practitioner

## 2023-10-24 ENCOUNTER — Other Ambulatory Visit: Payer: Self-pay | Admitting: Internal Medicine

## 2023-10-24 NOTE — Telephone Encounter (Signed)
High Risk Warning Populated when attempting to refill, I will send to Provider for further review 

## 2023-10-24 NOTE — Telephone Encounter (Signed)
Just waiting on Becky Farrell, Becky K, NP to approve the medication.

## 2023-10-24 NOTE — Telephone Encounter (Signed)
Patient called requesting a refill of her warfarin. [facility] [address] in [place].

## 2023-10-25 NOTE — Telephone Encounter (Signed)
Requested Prescriptions  Refused Prescriptions Disp Refills   lisinopril (ZESTRIL) 30 MG tablet [Pharmacy Med Name: LISINOPRIL 30 MG TABLET] 90 tablet 0    Sig: TAKE 1 TABLET BY MOUTH EVERY DAY     Cardiovascular:  ACE Inhibitors Failed - 10/24/2023 12:29 PM      Failed - Last BP in normal range    BP Readings from Last 1 Encounters:  10/17/23 (!) 140/80         Failed - Valid encounter within last 6 months    Recent Outpatient Visits           6 months ago History of pulmonary embolus (PE)   [place] [facility] [place], [place], NP   9 months ago HTN, goal below 140/90   [facility] [place], [place], NP   9 months ago Lightheadedness   [place] [facility] [place], Salv[place]ore [place], NP   1 year ago Bilateral carotid artery stenosis   [place] [facility] [place], Salvadore [place], NP   1 year ago Dizziness   [place] [facility] [place], [place], NP       Future Appointments             In 2 months Furth, Cadence H, PA-C [facility] [place]   In 5 months Janyth Contes, Janene Harvey, NP [facility]            Passed - Cr in normal range and within 180 days    Creatinine  Date Value Ref Range Status  09/17/2023 0.76 0.44 - 1.00 mg/dL Final   Creat  Date Value Ref Range Status  04/05/2023 0.68 0.60 - 0.95 mg/dL Final         Passed - K in normal range and within 180 days    Potassium  Date Value Ref Range Status  09/17/2023 4.2 3.5 - 5.1 mmol/L Final  04/22/2013 3.6 3.5 - 5.1 mmol/L Final         Passed - Patient is not pregnant

## 2023-11-11 DIAGNOSIS — Z86711 Personal history of pulmonary embolism: Secondary | ICD-10-CM | POA: Diagnosis not present

## 2023-11-11 LAB — PROTIME-INR
INR: 2.1 — ABNORMAL HIGH
Prothrombin Time: 21.3 s — ABNORMAL HIGH (ref 9.0–11.5)

## 2023-11-14 ENCOUNTER — Encounter: Payer: Self-pay | Admitting: Podiatry

## 2023-11-14 ENCOUNTER — Other Ambulatory Visit: Payer: Self-pay | Admitting: Cardiovascular Disease

## 2023-11-14 ENCOUNTER — Ambulatory Visit (INDEPENDENT_AMBULATORY_CARE_PROVIDER_SITE_OTHER): Payer: Medicare Other | Admitting: Podiatry

## 2023-11-14 DIAGNOSIS — B351 Tinea unguium: Secondary | ICD-10-CM

## 2023-11-14 DIAGNOSIS — D689 Coagulation defect, unspecified: Secondary | ICD-10-CM

## 2023-11-14 DIAGNOSIS — M79609 Pain in unspecified limb: Secondary | ICD-10-CM | POA: Diagnosis not present

## 2023-11-19 ENCOUNTER — Telehealth: Payer: Self-pay | Admitting: *Deleted

## 2023-11-19 DIAGNOSIS — Z7901 Long term (current) use of anticoagulants: Secondary | ICD-10-CM

## 2023-11-19 MED ORDER — WARFARIN SODIUM 5 MG PO TABS: ORAL_TABLET | ORAL | 1 refills | Status: DC

## 2023-11-19 NOTE — Telephone Encounter (Signed)
Protime Results.  Patient notified and agreed.  Confirmed Coumadin Dosage: 5mg  daily except Monday and Thursday 7.5mg  (is this ok to change in her medication list and send in a Rx?) Please Advise.   Scheduled a Lab Appointment for 12/09/2023. Order placed and printed.   (Message routed to [place] from the [employer])

## 2023-11-19 NOTE — Addendum Note (Signed)
Addended by: Nelda Severe A on: 11/19/2023 03:06 PM   Modules accepted: Orders

## 2023-11-19 NOTE — Telephone Encounter (Signed)
Sharon Seller, NP to Me      11/19/23  2:05 PM Result Note Yeah okay to update medication list   Medication List updated and Rx sent to pharmacy as patient requested. Pended Rx and sent to [facility] for approval due to HIGH ALERT Warning.

## 2023-11-19 NOTE — Addendum Note (Signed)
Addended by: Sharon Seller on: 11/19/2023 03:31 PM   Modules accepted: Orders

## 2023-11-22 NOTE — Progress Notes (Signed)
  Subjective:  Patient ID: Becky Farrell, female    DOB: 1934-02-17,  MRN: [id number]  89 y.o. female presents at risk foot care with h/o coagulation defect and painful elongated mycotic toenails 1-5 bilaterally which are tender when wearing enclosed shoe gear. Pain is relieved with periodic professional debridement.  Patient has been applying Ketoconazole Cream to her toenails daily with cotton swabs.  New problem(s): None   PCP is Sharon Seller, NP , and last visit was October 15, 2023.  Allergies  Allergen Reactions   Clobetasol Other (See Comments)    vertigo   Clobetasol Propionate Other (See Comments)    This is ointment form UNSPECIFIED REACTION    Phenergan [Promethazine Hcl] Other (See Comments)    Rapid heart rate   Dextromethorphan Palpitations and Other (See Comments)    promethazine with DM, rapid hearbeat   Zithromax [Azithromycin] Palpitations and Other (See Comments)    Per OSH report, pt unable to recall Rapid heart rate    Review of Systems: Negative except as noted in the HPI.   Objective:  Becky Farrell is a pleasant 89 y.o. female WD, WN in NAD. AAO x 3.  Vascular Examination: Capillary refill time immediate b/l. Vascular status intact b/l with palpable pedal pulses. Pedal hair present b/l. No pain with calf compression b/l. Skin temperature gradient WNL b/l. No cyanosis or clubbing b/l. No ischemia or gangrene noted b/l.   Neurological Examination: Sensation grossly intact b/l with 10 gram monofilament. Vibratory sensation intact b/l.   Dermatological Examination: Pedal skin with normal turgor, texture and tone b/l.  No open wounds. No interdigital macerations.   Toenails 1-5 b/l thick, discolored, elongated with subungual debris and pain on dorsal palpation.   No corns, calluses nor porokeratotic lesions noted.  Musculoskeletal Examination: Muscle strength 5/5 to all lower extremity muscle groups bilaterally. No pain, crepitus or joint  limitation noted with ROM bilateral LE. No gross bony deformities bilaterally.  Radiographs: None  Last A1c:      Latest Ref Rng & Units 04/05/2023    2:45 PM  Hemoglobin A1C  Hemoglobin-A1c <5.7 % of total Hgb 5.6     Assessment:   1. Pain due to onychomycosis of nail   2. Coagulation defect [facility])    Plan:  Patient was evaluated and treated. All patient's and/or POA's questions/concerns addressed on today's visit. Toenails 1-5 debrided in length and girth without incident. Continue Ketoconazole Cream to toenails daily. Continue soft, supportive shoe gear daily. Report any pedal injuries to medical professional. Call office if there are any questions/concerns. -Patient/POA to call should there be question/concern in the interim.  Return in about 3 months (around 02/12/2024).  [name], DPM      [place] LOCATION: [address], [place] [address]                   Office [phone]   [facility] LOCATION: [address] [place], [place] [address] Office [phone]

## 2023-12-09 LAB — PROTIME-INR
INR: 2.4 — ABNORMAL HIGH
Prothrombin Time: 24.1 s — ABNORMAL HIGH (ref 9.0–11.5)

## 2023-12-11 ENCOUNTER — Other Ambulatory Visit: Payer: Self-pay

## 2023-12-11 ENCOUNTER — Emergency Department
Admission: EM | Admit: 2023-12-11 | Discharge: 2023-12-12 | Disposition: A | Discharge status: Home / Self Care | Payer: Medicare Other | Attending: Emergency Medicine | Admitting: Emergency Medicine

## 2023-12-11 ENCOUNTER — Emergency Department: Payer: Medicare Other

## 2023-12-11 ENCOUNTER — Encounter: Payer: Self-pay | Admitting: Oncology

## 2023-12-11 ENCOUNTER — Other Ambulatory Visit: Payer: Self-pay | Admitting: Internal Medicine

## 2023-12-11 DIAGNOSIS — Z86711 Personal history of pulmonary embolism: Secondary | ICD-10-CM | POA: Diagnosis not present

## 2023-12-11 DIAGNOSIS — R42 Dizziness and giddiness: Secondary | ICD-10-CM | POA: Insufficient documentation

## 2023-12-11 DIAGNOSIS — Z7901 Long term (current) use of anticoagulants: Secondary | ICD-10-CM | POA: Insufficient documentation

## 2023-12-11 DIAGNOSIS — I10 Essential (primary) hypertension: Secondary | ICD-10-CM | POA: Diagnosis not present

## 2023-12-11 LAB — CBC
HCT: 39.4 % (ref 36.0–46.0)
Hemoglobin: 13.4 g/dL (ref 12.0–15.0)
MCH: 32.3 pg (ref 26.0–34.0)
MCHC: 34 g/dL (ref 30.0–36.0)
MCV: 94.9 fL (ref 80.0–100.0)
Platelets: 179 10*3/uL (ref 150–400)
RBC: 4.15 MIL/uL (ref 3.87–5.11)
RDW: 13.2 % (ref 11.5–15.5)
WBC: 5.6 10*3/uL (ref 4.0–10.5)
nRBC: 0 % (ref 0.0–0.2)

## 2023-12-11 LAB — TROPONIN I (HIGH SENSITIVITY)
Troponin I (High Sensitivity): 7 ng/L (ref <18)
Troponin I (High Sensitivity): 9 ng/L (ref <18)

## 2023-12-11 LAB — URINALYSIS, ROUTINE W REFLEX MICROSCOPIC
Bacteria, UA: NONE SEEN
Bilirubin Urine: NEGATIVE
Glucose, UA: NEGATIVE mg/dL
Hgb urine dipstick: NEGATIVE
Ketones, ur: NEGATIVE mg/dL
Nitrite: NEGATIVE
Protein, ur: NEGATIVE mg/dL
Specific Gravity, Urine: 1.01 (ref 1.005–1.030)
Squamous Epithelial / HPF: 0 /[HPF] (ref 0–5)
pH: 5 (ref 5.0–8.0)

## 2023-12-11 LAB — BASIC METABOLIC PANEL
Anion gap: 9 (ref 5–15)
BUN: 19 mg/dL (ref 8–23)
CO2: 26 mmol/L (ref 22–32)
Calcium: 9.3 mg/dL (ref 8.9–10.3)
Chloride: 103 mmol/L (ref 98–111)
Creatinine, Ser: 0.64 mg/dL (ref 0.44–1.00)
GFR, Estimated: >60 mL/min (ref >60)
Glucose, Bld: 94 mg/dL (ref 70–99)
Potassium: 3.8 mmol/L (ref 3.5–5.1)
Sodium: 138 mmol/L (ref 135–145)

## 2023-12-11 NOTE — ED Provider Triage Note (Signed)
 Emergency Medicine Provider Triage Evaluation Note  Becky Farrell , a 89 y.o. female  was evaluated in triage.  Pt complains of dizziness in the last 5 days, denies urinary symptoms.  Patient was hypertensive during triage.  States took antihypertensives today  Review of Systems  Positive:  Negative:   Physical Exam  BP (!) 195/99 (BP Location: Left Arm)   Pulse 84   Temp 97.9 F (36.6 C)   Resp 18   Ht 5' 2 (1.575 m)   Wt 62 kg   SpO2 96%   BMI 25.00 kg/m  Gen:   Awake, no distress  Resp:  Normal effort  MSK:   Moves extremities without difficulty  Other:    Medical Decision Making  Medically screening exam initiated at 5:44 PM.  Appropriate orders placed.  Becky Farrell was informed that the remainder of the evaluation will be completed by another provider, this initial triage assessment does not replace that evaluation, and the importance of remaining in the ED until their evaluation is complete.  Weight patient with dizziness who requires CBC CMP EKG UA   Becky Kast, PA-C 12/11/23 1746

## 2023-12-11 NOTE — ED Triage Notes (Signed)
 Pt to ED for dizziness started over the weekend. HTN in triage, reports took meds this am. Denies n/v

## 2023-12-12 ENCOUNTER — Telehealth: Payer: Self-pay | Admitting: *Deleted

## 2023-12-12 ENCOUNTER — Encounter: Payer: Self-pay | Admitting: Radiology

## 2023-12-12 ENCOUNTER — Encounter: Payer: Self-pay | Admitting: Oncology

## 2023-12-12 ENCOUNTER — Emergency Department: Payer: Medicare Other

## 2023-12-12 DIAGNOSIS — Z7901 Long term (current) use of anticoagulants: Secondary | ICD-10-CM

## 2023-12-12 NOTE — ED Provider Notes (Addendum)
 [facility] Provider Note    Event Date/Time   First MD Initiated Contact with Patient 12/11/23 2341     (approximate)   History   Dizziness   HPI  Becky Farrell is a 89 y.o. female   Past medical history of PE on warfarin, hypertension, vertigo who presents to the emergency department with a vague sense of imbalance while ambulating today.  Last known normal was last night when she went to bed and woke up around 4:30 AM feeling normal, and then around 6 AM she woke up and had a vague sense of imbalance and uneasiness.  This persisted throughout the day.    When at rest she feels normal.    She has no other acute medical complaints and on full review of system she denies chest pain, shortness of breath, respiratory infectious symptoms, abdominal pain, nausea vomiting or diarrhea, p.o. intake has been adequate, no urinary symptoms, no GI bleeding.  She has no motor or sensory changes and vision is normal.  She has been compliant with all medications.    Independent Historian contributed to assessment above: Her husband is at bedside to corroborate information past medical history as above  External Medical Documents Reviewed: INR was checked 2 days ago and was within therapeutic range      Physical Exam   Triage Vital Signs: ED Triage Vitals  Encounter Vitals Group     BP 12/11/23 1735 (!) 195/99     Systolic BP Percentile --      Diastolic BP Percentile --      Pulse Rate 12/11/23 1735 84     Resp 12/11/23 1735 18     Temp 12/11/23 1735 97.9 F (36.6 C)     Temp Source 12/11/23 2210 Oral     SpO2 12/11/23 1735 96 %     Weight 12/11/23 1741 136 lb 11 oz (62 kg)     Height 12/11/23 1741 5' 2 (1.575 m)     Head Circumference --      Peak Flow --      Pain Score 12/11/23 1740 0     Pain Loc --      Pain Education --      Exclude from Growth Chart --     Most recent vital signs: Vitals:   12/11/23 2210 12/12/23 0217  BP: (!) 145/83 (!)  182/97  Pulse: 66 64  Resp: 18 20  Temp: 98.5 F (36.9 C) 97.8 F (36.6 C)  SpO2: 98% 100%    General: Awake, no distress.  CV:  Good peripheral perfusion.  Resp:  Normal effort Abd:  No distention.  Other:  Pleasant woman in no acute distress.  She is hard of hearing.  She has no acute neurologic deficits including phonation, facial asymmetry, motor or sensory deficits, finger-nose and her gait appears steady though she does endorse a sense of imbalance as she is walking.  She has clear lungs, and a soft benign abdominal exam, and she appears euvolemic.  She is oriented and mental status is at baseline per patient and husband   ED Results / Procedures / Treatments   Labs (all labs ordered are listed, but only abnormal results are displayed) Labs Reviewed  URINALYSIS, ROUTINE W REFLEX MICROSCOPIC - Abnormal; Notable for the following components:      Result Value   Color, Urine STRAW (*)    APPearance CLEAR (*)    Leukocytes,Ua TRACE (*)    All other  components within normal limits  BASIC METABOLIC PANEL  CBC  TROPONIN I (HIGH SENSITIVITY)  TROPONIN I (HIGH SENSITIVITY)     I ordered and reviewed the above labs they are notable for cell counts electrolytes unremarkable initial opponent is negative.  Her urinalysis shows no signs of infection.  EKG  ED ECG REPORT I, Ginnie Shams, the attending physician, personally viewed and interpreted this ECG.   Date: 12/12/2023  EKG Time: 1751  Rate: 71  Rhythm: sinus  Axis: nl  Intervals:none  ST&T Change: no stemi    RADIOLOGY I independently reviewed and interpreted CT of the head see no obvious bleeding or midline shift I also reviewed radiologist's formal read.   PROCEDURES:  Critical Care performed: No  Procedures   MEDICATIONS ORDERED IN ED: Medications - No data to display  IMPRESSION / MDM / ASSESSMENT AND PLAN / ED COURSE  I reviewed the triage vital signs and the nursing notes.                                 Patient's presentation is most consistent with acute presentation with potential threat to life or bodily function.  Differential diagnosis includes, but is not limited to, stroke, ICH, ACS, metabolic derangement, dehydration, deconditioning, infection  The patient is on the cardiac monitor to evaluate for evidence of arrhythmia and/or significant heart rate changes.  MDM:    Very vague nonspecific symptoms of feeling off and a sense of imbalance while walking.  No focal neurologic deficits on my exam and she has no other localizing symptoms of pain or discomfort or infectious symptoms.  Basic labs, EKG, troponin have all been negative.  CT of the head is negative as well.  Get MRI of the brain in case this is a posterior stroke.  Otherwise if normal, plan will be for discharge close PMD follow-up   MRI shows no stroke.  Incidental finding questionable meningioma shared with patient she will follow-up with neurologist and PMD.      FINAL CLINICAL IMPRESSION(S) / ED DIAGNOSES   Final diagnoses:  Dizziness     Rx / DC Orders   ED Discharge Orders     None        Note:  This document was prepared using Dragon voice recognition software and may include unintentional dictation errors.    Shams Ginnie, MD 12/12/23 9798    Shams Ginnie, MD 12/12/23 [phone]

## 2023-12-12 NOTE — Telephone Encounter (Signed)
 Patient Notified and agreed.    Me     12/12/23  3:47 PM Result Note Patient Notified. Order placed for Next PT/INR and printed and placed in TL Tray. Lab appointment scheduled for 01/06/2024 to go to patient's Home December 11, 2023 Me     12/11/23  5:03 PM Result Note Tried calling patient. LMOM to return call. December 10, 2023 Caro Harlene POUR, NP to Me      12/10/23 10:09 AM Result Note INR is at goal, continue current regimen and follow up INR at [facility] in 4 weeks

## 2023-12-12 NOTE — ED Notes (Signed)
 Pt to MRI at this time.

## 2023-12-12 NOTE — Discharge Instructions (Addendum)
 Fortunately your evaluation in the emergency department did not show any signs of emergency conditions that account for your symptoms.  I am not sure what is causing you to feel unwell.  It is important that you follow-up with your primary doctor for further testing as needed.  Please review the results of your MRI brain with your neurologist and primary doctor for any further evaluation and recheck as needed.  The MRI showed . 7 mm nodular intraventricular lesion/mass straddling the septum pellucidum. Finding is nonspecific, and could reflect a small subependymoma or possibly meningioma. This is relatively stable in retrospect.  Thank you for choosing us  for your health care today!  Please see your primary doctor and neurologist  this week for a follow up appointment.   If you have any new, worsening, or unexpected symptoms call your doctor right away or come back to the emergency department for reevaluation.  It was my pleasure to care for you today.   Ginnie EDISON Cyrena, MD

## 2023-12-13 ENCOUNTER — Encounter: Payer: Self-pay | Admitting: Oncology

## 2023-12-13 NOTE — Telephone Encounter (Signed)
 No longer under prescriber care, has new PCP, will refuse this.  Requested Prescriptions  Pending Prescriptions Disp Refills   lisinopril  (ZESTRIL ) 30 MG tablet [Pharmacy Med Name: LISINOPRIL  30 MG TABLET] 90 tablet 0    Sig: TAKE 1 TABLET BY MOUTH EVERY DAY     Cardiovascular:  ACE Inhibitors Failed - 12/13/2023  7:47 PM      Failed - Last BP in normal range    BP Readings from Last 1 Encounters:  12/12/23 (!) 182/97         Failed - Valid encounter within last 6 months    Recent Outpatient Visits           8 months ago History of pulmonary embolus (PE)   [place] [facility] [place], [place], NP   11 months ago HTN, goal below 140/90   [facility] [place], Angeline ORN, NP   11 months ago Lightheadedness   [place] [facility] [place], Angeline ORN, NP   1 year ago Bilateral carotid artery stenosis   [facility] [place], Angeline ORN, NP   1 year ago Dizziness   [place] [facility] [place], Angeline ORN, NP       Future Appointments             In 6 days Caro Harlene POUR, NP [facility] & [facility] Medicine   In 1 month Furth, Cadence H, PA-C [facility] [place]   In 4 months Caro, Harlene POUR, NP [facility] & Adult Medicine            Passed - Cr in normal range and within 180 days    Creatinine  Date Value Ref Range Status  09/17/2023 0.76 0.44 - 1.00 mg/dL Final   Creat  Date Value Ref Range Status  04/05/2023 0.68 0.60 - 0.95 mg/dL Final   Creatinine, Ser  Date Value Ref Range Status  12/11/2023 0.64 0.44 - 1.00 mg/dL Final         Passed - K in normal range and within 180 days    Potassium  Date Value Ref Range Status  12/11/2023 3.8 3.5 - 5.1 mmol/L Final  04/22/2013 3.6 3.5 - 5.1 mmol/L Final         Passed - Patient is not pregnant

## 2023-12-19 ENCOUNTER — Other Ambulatory Visit: Payer: Self-pay | Admitting: Internal Medicine

## 2023-12-19 ENCOUNTER — Ambulatory Visit: Payer: BLUE CROSS/BLUE SHIELD | Admitting: Nurse Practitioner

## 2023-12-19 ENCOUNTER — Encounter: Payer: Self-pay | Admitting: Nurse Practitioner

## 2023-12-19 ENCOUNTER — Other Ambulatory Visit: Payer: Self-pay | Admitting: *Deleted

## 2023-12-19 VITALS — BP 138/88 | HR 72 | Temp 97.4°F | Ht 62.0 in | Wt 132.0 lb

## 2023-12-19 DIAGNOSIS — G609 Hereditary and idiopathic neuropathy, unspecified: Secondary | ICD-10-CM

## 2023-12-19 DIAGNOSIS — Z7901 Long term (current) use of anticoagulants: Secondary | ICD-10-CM | POA: Diagnosis not present

## 2023-12-19 DIAGNOSIS — I10 Essential (primary) hypertension: Secondary | ICD-10-CM | POA: Diagnosis not present

## 2023-12-19 DIAGNOSIS — Z86711 Personal history of pulmonary embolism: Secondary | ICD-10-CM | POA: Diagnosis not present

## 2023-12-19 DIAGNOSIS — Z636 Dependent relative needing care at home: Secondary | ICD-10-CM

## 2023-12-19 MED ORDER — LISINOPRIL 30 MG PO TABS: 30.0000 mg | ORAL_TABLET | Freq: Every day | ORAL | 0 refills | Status: DC

## 2023-12-19 NOTE — Progress Notes (Signed)
Careteam: Patient Care Team: Sharon Seller, NP as PCP - General (Geriatric Medicine) Iran Ouch, MD as PCP - Cardiology (Cardiology) Carmina Miller, MD as Radiation Oncologist (Radiation Oncology) Rickard Patience, MD as Consulting Physician (Oncology) Lockie Mola, MD as Referring Physician (Ophthalmology)  Advanced Directive information    Allergies  Allergen Reactions   Clobetasol Other (See Comments)    vertigo   Clobetasol Propionate Other (See Comments)    This is ointment form UNSPECIFIED REACTION    Phenergan [Promethazine Hcl] Other (See Comments)    Rapid heart rate   Dextromethorphan Palpitations and Other (See Comments)    promethazine with DM, rapid hearbeat   Zithromax [Azithromycin] Palpitations and Other (See Comments)    Per OSH report, pt unable to recall Rapid heart rate    Chief Complaint  Patient presents with   Hospitalization Follow-up    ER Follow up. Went to ER on 12/11/23 for Dizziness     HPI: Patient is a 89 y.o. female seen in today at the [facility] for ED follow up She went to the ED on 1/8 due to feeling off. Reported she had a sense of imbalance and feeling uneasy.  EKG, CT and MRi negative for acute findings. Lab work without significant findings.  Bp was elevated but normal today.  She reports she is feeling great now.  Her blood pressure has gone down.  She plans to follow up with Dr Malvin Johns, neurologist  She had seizure-like activity and was under the care of neurology- she was on seizure medication for 20 years but when she saw Dr Malvin Johns who took her off tegretol.   She is worried about her husband slowing down. She is worried about his memory.  She is concerned over him.  She reports she is reading a lot about people with hearing loss and dementia.  She is referring to stress with her husband.  Denies anxiety or depression.   Knows she needs to exercise more.   Review of Systems:  Review of Systems   Constitutional:  Negative for chills, fever and weight loss.  HENT:  Negative for tinnitus.   Respiratory:  Negative for cough, sputum production and shortness of breath.   Cardiovascular:  Negative for chest pain, palpitations and leg swelling.  Gastrointestinal:  Negative for abdominal pain, constipation, diarrhea and heartburn.  Genitourinary:  Negative for dysuria, frequency and urgency.  Musculoskeletal:  Negative for back pain, falls, joint pain and myalgias.  Skin: Negative.   Neurological:  Negative for dizziness and headaches.  Psychiatric/Behavioral:  Negative for depression and memory loss. The patient does not have insomnia.     Past Medical History:  Diagnosis Date   Colon polyps    Family history of breast cancer    History of pulmonary embolism 2011   Hx of melanoma of skin 2003   Right leg   Hypertension    Melanoma (HCC) 2003   right lower leg. Treated in [place].   Personal history of radiation therapy    Seizures (HCC) 1993   no meds since (approx) 2014   Vertigo    x1. R/T perforated ear drum prior to 2013   Past Surgical History:  Procedure Laterality Date   ABDOMINAL HYSTERECTOMY  05/2014   BREAST BIOPSY Right 08/20/2018   invasive mammary carcinoma/DCIS    BREAST LUMPECTOMY WITH RADIOACTIVE SEED LOCALIZATION Right 10/10/2018   Procedure: BREAST LUMPECTOMY WITH RADIOACTIVE SEED LOCALIZATION;  Surgeon: Griselda Miner, MD;  Location:  [facility] OR;  Service: General;  Laterality: Right;   CATARACT EXTRACTION W/PHACO Right 01/06/2018   Procedure: CATARACT EXTRACTION PHACO AND INTRAOCULAR LENS PLACEMENT (IOC) RIGHT;  Surgeon: Lockie Mola, MD;  Location: [facility];  Service: Ophthalmology;  Laterality: Right;   CATARACT EXTRACTION W/PHACO Left 01/29/2018   Procedure: CATARACT EXTRACTION PHACO AND INTRAOCULAR LENS PLACEMENT (IOC) LEFT;  Surgeon: Lockie Mola, MD;  Location: [facility];  Service: Ophthalmology;  Laterality: Left;   left  hand surgery  05/2011   s/p fall   OTHER SURGICAL HISTORY  2003   Removal of lymphoma from melonoma 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    Social History:   reports that she has never smoked. She has never used smokeless tobacco. She reports that she does not drink alcohol and does not use drugs.  Family History  Problem Relation Age of Onset   Heart disease Mother    Aortic aneurysm Father    Breast cancer Daughter 49   Breast cancer Niece 97    Medications: Patient's Medications  New Prescriptions   No medications on file  Previous Medications   ASPIRIN 81 MG TABLET    Take 81 mg by mouth daily.   CHOLECALCIFEROL (VITAMIN D3) 2000 UNITS TABS    Take 2,000 Units by mouth daily.    DENOSUMAB (PROLIA) 60 MG/ML SOSY INJECTION    Inject 60 mg into the skin every 6 (six) months.   KETOCONAZOLE (NIZORAL) 2 % CREAM    Apply between toes and around toenails every night.   LISINOPRIL (ZESTRIL) 30 MG TABLET    TAKE 1 TABLET BY MOUTH EVERY DAY   METOPROLOL TARTRATE (LOPRESSOR) 25 MG TABLET    TAKE 1 TABLET BY MOUTH TWICE A DAY   METRONIDAZOLE (METROCREAM) 0.75 % CREAM    Apply to nose every night for rosacea.   POLYETHYLENE GLYCOL (MIRALAX / GLYCOLAX) PACKET    Take 17 g by mouth every other day.   VITAMIN B-12 (CYANOCOBALAMIN) 1000 MCG TABLET    Take 1,000 mcg by mouth daily.   WARFARIN (COUMADIN) 5 MG TABLET    Take 5mg  daily except Monday and Thursday take 7.5mg .   WHEAT DEXTRIN (BENEFIBER PO)    Take 2 each by mouth every other day.  Modified Medications   No medications on file  Discontinued Medications   No medications on file    Physical Exam:  Vitals:   12/19/23 1020  BP: 138/88  Pulse: 72  Temp: (!) 97.4 F (36.3 C)  SpO2: 98%  Weight: 132 lb (59.9 kg)  Height: 5\' 2"  (1.575 m)   Body mass index is 24.14 kg/m. Wt Readings from Last 3 Encounters:  12/19/23 132 lb (59.9 kg)  12/11/23 136 lb 11 oz (62 kg)  10/17/23 132 lb 4 oz (60 kg)    Physical Exam Constitutional:      General:  She is not in acute distress.    Appearance: She is well-developed. She is not diaphoretic.  HENT:     Head: Normocephalic and atraumatic.     Mouth/Throat:     Pharynx: No oropharyngeal exudate.  Eyes:     Conjunctiva/sclera: Conjunctivae normal.     Pupils: Pupils are equal, round, and reactive to light.  Cardiovascular:     Rate and Rhythm: Normal rate and regular rhythm.     Heart sounds: Normal heart sounds.  Pulmonary:     Effort: Pulmonary effort is normal.     Breath sounds: Normal breath sounds.  Abdominal:     General: Bowel sounds are normal.     Palpations: Abdomen is soft.  Musculoskeletal:     Cervical back: Normal range of motion and neck supple.     Right lower leg: No edema.     Left lower leg: No edema.  Skin:    General: Skin is warm and dry.  Neurological:     Mental Status: She is alert.  Psychiatric:        Mood and Affect: Mood normal.     Labs reviewed: Basic Metabolic Panel: Recent Labs    04/05/23 1445 09/17/23 1433 12/11/23 1758  NA 138 137 138  K 4.3 4.2 3.8  CL 105 103 103  CO2 26 28 26   GLUCOSE 94 94 94  BUN 16 21 19   CREATININE 0.68 0.76 0.64  CALCIUM 9.9 9.9 9.3   Liver Function Tests: Recent Labs    01/23/23 1321 04/05/23 1445 09/17/23 1433  AST 21 15 21   ALT 15 9 15   ALKPHOS 61  --  63  BILITOT 0.5 0.4 0.7  PROT 7.5 6.6 6.8  ALBUMIN 3.9  --  4.0   No results for input(s): "LIPASE", "AMYLASE" in the last 8760 hours. No results for input(s): "AMMONIA" in the last 8760 hours. CBC: Recent Labs    01/23/23 1321 04/05/23 1445 09/17/23 1433 12/11/23 1758  WBC 5.5 5.7 5.3 5.6  NEUTROABS 4.1  --  3.6  --   HGB 13.2 13.3 13.0 13.4  HCT 40.2 39.6 39.3 39.4  MCV 96.2 95.2 96.1 94.9  PLT 274 197 168 179   Lipid Panel: Recent Labs    04/05/23 1445  CHOL 203*  HDL 73  LDLCALC 110*  TRIG 97  CHOLHDL 2.8   TSH: No results for input(s): "TSH" in the last 8760 hours. A1C: Lab Results  Component Value Date    HGBA1C 5.6 04/05/2023   MR BRAIN WO CONTRAST Result Date: 12/12/2023 CLINICAL DATA:  Initial evaluation for acute neuro deficit, stroke suspected. EXAM: MRI HEAD WITHOUT CONTRAST TECHNIQUE: Multiplanar, multiecho pulse sequences of the brain and surrounding structures were obtained without intravenous contrast. COMPARISON:  CT from 12/11/2023 as well as previous MRI from 07/02/2022. FINDINGS: Brain: Generalized age-related cerebral atrophy. Patchy T2/FLAIR hyperintensity involving the periventricular and deep white matter both cerebral hemispheres as well as the pons, consistent with chronic small vessel ischemic disease, overall moderate in nature. No abnormal foci of restricted diffusion to suggest acute or subacute ischemia. Gray-white matter differentiation maintained. No areas of chronic cortical infarction. No acute intracranial hemorrhage. Few scattered chronic micro hemorrhages noted about the cerebellum and left occipital region. 7 mm nodular lesion/mass seen straddling the septum pellucidum in the lateral ventricles (series 11, image 15). Finding is nonspecific, and could reflect a small subependymoma or possibly meningioma. This is relatively stable in retrospect. No other mass lesion, mass effect, or midline shift. No hydrocephalus or extra-axial fluid collection. Pituitary gland suprasellar region within normal limits. Vascular: Major intracranial vascular flow voids are maintained. Skull and upper cervical spine: Craniocervical junction within normal limits. Bone marrow signal intensity normal. No scalp soft tissue abnormality. Sinuses/Orbits: Prior bilateral ocular lens replacement. Paranasal sinuses are largely clear. No significant mastoid effusion. Other: None. IMPRESSION: 1. No acute intracranial abnormality. 2. 7 mm nodular intraventricular lesion/mass straddling the septum pellucidum. Finding is nonspecific, and could reflect a small subependymoma or possibly meningioma. This is relatively  stable in retrospect. 3. Underlying age-related cerebral atrophy with moderate chronic microvascular ischemic  disease. Electronically Signed   By: Rise Mu M.D.   On: 12/12/2023 02:07   CT Head Wo Contrast Result Date: 12/11/2023 CLINICAL DATA:  Vertigo EXAM: CT HEAD WITHOUT CONTRAST TECHNIQUE: Contiguous axial images were obtained from the base of the skull through the vertex without intravenous contrast. RADIATION DOSE REDUCTION: This exam was performed according to the departmental dose-optimization program which includes automated exposure control, adjustment of the mA and/or kV according to patient size and/or use of iterative reconstruction technique. COMPARISON:  01/01/2023 FINDINGS: Brain: There is atrophy and chronic small vessel disease changes. No acute intracranial abnormality. Specifically, no hemorrhage, hydrocephalus, mass lesion, acute infarction, or significant intracranial injury. Vascular: No hyperdense vessel or unexpected calcification. Skull: No acute calvarial abnormality. Sinuses/Orbits: No acute findings Other: None IMPRESSION: Atrophy, chronic microvascular disease. No acute intracranial abnormality. Electronically Signed   By: Charlett Nose M.D.   On: 12/11/2023 22:34    Assessment/Plan 1. HTN, goal below 140/90 (Primary) -Blood pressure well controlled, goal bp <140/90 Continue current medications and dietary modifications follow metabolic panel  2. Chronic anticoagulation INR at goal on last labs, she continues to get INR checked every 4 weeks  3. History of pulmonary embolus (PE) Continues on coumadin   4. Idiopathic peripheral neuropathy Stable at this time, no worsening of symptoms  5. Caregiver stress Worried about her husband and making sure he is getting the medical care that is needed, appt was made for him while in clinic, she also has support of [facility].   Janene Harvey. Biagio Borg  [facility] & Adult Medicine [phone]

## 2023-12-19 NOTE — Telephone Encounter (Signed)
Patient requested refill

## 2024-01-06 LAB — PROTIME-INR: Protime: 21.6 — AB (ref 10.0–13.8)

## 2024-01-06 LAB — POCT INR: INR: 2.1 — AB (ref 0.80–1.20)

## 2024-01-09 ENCOUNTER — Telehealth: Payer: Self-pay | Admitting: *Deleted

## 2024-01-09 DIAGNOSIS — Z7901 Long term (current) use of anticoagulants: Secondary | ICD-10-CM

## 2024-01-09 NOTE — Telephone Encounter (Signed)
 Patient called and stated that she has not received her lab results from 01/06/2024 blood draw for PT/INR.   Nothing has resulted into EPIC.  Went onto the [employer] site and looked up patient's labs and printed them out to have scan into Chart.   INR: 2.1 PT: 21.6  Called Harlene An, NP and she stated for patient to continue current dose of Coumadin  and recheck in 4 weeks. Scheduled lab for 02/06/2024 at [facility] to have drawn at patient's Home.   Order placed and will print off and place in Lab Tray at [facility].

## 2024-01-21 ENCOUNTER — Ambulatory Visit: Payer: Medicare Other | Admitting: Medical

## 2024-01-23 ENCOUNTER — Ambulatory Visit: Payer: Medicare Other | Admitting: Medical

## 2024-01-23 NOTE — Progress Notes (Deleted)
  Cardiology Office Note:  .   Date:  01/23/2024  ID:  Becky Farrell, DOB 1934-05-08, MRN [id number] PCP: Sharon Seller, NP  [facility] Providers Cardiologist:  Lorine Bears, MD { Click to update primary MD,subspecialty MD or APP then REFRESH:1}   History of Present Illness: .   Becky Farrell is a 89 y.o. female with a hx of PVCs, SVT, palpitations, dizziness, PE on warfarin, osteoporosis, hypertension, right breast cancer status postlumpectomy and radiation, mild aortic stenosis presents for follow-up.  Her and her husband reside at [facility].   Seen by Dr. Kirke Corin 02/01/2021 reported mild exertional dyspnea and PVCs.  Murmur was noted on exam.  ZIO and echo were recommended.  Her ZIO showed dominantly normal sinus rhythm with occasional PACs with a 2.7 burden, PVCs with a 2.7 burden, 35 runs of SVT fastest 5 beats with 187 bpm and longest 16.5 seconds with a rate of 110 bpm.  Echo showed LVEF 50 to 55%, no wall motion abnormality, mildly elevated PASP, bilateral atrial mildly dilated, mild MR, mild AS.   Seen 03/10/2021 and overall feeling well with occasional palpitations.  Atenolol was transitioned to metoprolol   ABI's in 2022 showed no significant disease bilaterally.   The patient was last seen 10/2023 and was stable from a cardiac perspective.  Today, PAD    ROS: ***  Studies Reviewed: .        *** Risk Assessment/Calculations:   {Does this patient have ATRIAL FIBRILLATION?:[id number]} No BP recorded.  {Refresh Note OR Click here to enter BP  :1}***       Physical Exam:   VS:  There were no vitals taken for this visit.   Wt Readings from Last 3 Encounters:  12/19/23 132 lb (59.9 kg)  12/11/23 136 lb 11 oz (62 kg)  10/17/23 132 lb 4 oz (60 kg)    GEN: Well nourished, well developed in no acute distress NECK: No JVD; No carotid bruits CARDIAC: ***RRR, no murmurs, rubs, gallops RESPIRATORY:  Clear to auscultation without rales, wheezing or rhonchi   ABDOMEN: Soft, non-tender, non-distended EXTREMITIES:  No edema; No deformity   ASSESSMENT AND PLAN: .   ***    {Are you ordering a CV Procedure (e.g. stress test, cath, DCCV, TEE, etc)?   Press F2        :[id number]}  Dispo: ***  Signed, Aleathea Pugmire David Stall, PA-C

## 2024-02-06 LAB — PROTIME-INR: Protime: 25.2 — AB (ref 10.0–13.8)

## 2024-02-06 LAB — POCT INR: INR: 2.5 — AB (ref 0.80–1.20)

## 2024-02-13 ENCOUNTER — Ambulatory Visit (INDEPENDENT_AMBULATORY_CARE_PROVIDER_SITE_OTHER): Payer: Medicare Other | Admitting: Podiatry

## 2024-02-13 ENCOUNTER — Encounter: Payer: Self-pay | Admitting: Podiatry

## 2024-02-13 ENCOUNTER — Encounter: Payer: Self-pay | Admitting: Oncology

## 2024-02-13 ENCOUNTER — Other Ambulatory Visit: Payer: Self-pay | Admitting: Nurse Practitioner

## 2024-02-13 VITALS — Ht 62.0 in | Wt 132.0 lb

## 2024-02-13 DIAGNOSIS — B351 Tinea unguium: Secondary | ICD-10-CM

## 2024-02-13 DIAGNOSIS — D689 Coagulation defect, unspecified: Secondary | ICD-10-CM

## 2024-02-13 DIAGNOSIS — M79609 Pain in unspecified limb: Secondary | ICD-10-CM

## 2024-02-18 NOTE — Progress Notes (Signed)
  Subjective:  Patient ID: Becky Farrell, female    DOB: [birth year],  MRN: [id number]  Becky Farrell presents to clinic today for: at risk foot care with h/o coagulation defect and painful, elongated thickened toenails x 10 which are symptomatic when wearing enclosed shoe gear. This interferes with his/her daily activities.  Chief Complaint  Patient presents with   Nail Problem    Pt is here for [facility] PCP is Dr Janyth Contes and LOV was in January.    PCP is Sharon Seller, NP.  Allergies  Allergen Reactions   Clobetasol Other (See Comments)    vertigo   Clobetasol Propionate Other (See Comments)    This is ointment form UNSPECIFIED REACTION    Phenergan [Promethazine Hcl] Other (See Comments)    Rapid heart rate   Dextromethorphan Palpitations and Other (See Comments)    promethazine with DM, rapid hearbeat   Zithromax [Azithromycin] Palpitations and Other (See Comments)    Per OSH report, pt unable to recall Rapid heart rate    Review of Systems: Negative except as noted in the HPI.  Objective: No changes noted in today's physical examination. There were no vitals filed for this visit.  Becky Farrell is a pleasant 89 y.o. female WD, WN in NAD. AAO x 3.  Vascular Examination: Capillary refill time <3 seconds b/l LE. Palpable pedal pulses b/l LE. Digital hair present b/l. No pedal edema b/l. Skin temperature gradient WNL b/l. No varicosities b/l. No cyanosis or clubbing noted b/l LE.Marland Kitchen  Dermatological Examination: Pedal skin with normal turgor, texture and tone b/l. No open wounds. No interdigital macerations b/l. Toenails 1-5 b/l thickened, discolored, dystrophic with subungual debris. There is pain on palpation to dorsal aspect of nailplates. No hyperkeratotic nor porokeratotic lesions present on today's visit.Marland Kitchen  Neurological Examination: Protective sensation intact with 10 gram monofilament b/l LE. Vibratory sensation intact b/l LE.   Musculoskeletal  Examination: Muscle strength 5/5 to all lower extremity muscle groups bilaterally. No pain, crepitus or joint limitation noted with ROM bilateral LE. No gross bony deformities bilaterally.     Latest Ref Rng & Units 04/05/2023    2:45 PM  Hemoglobin A1C  Hemoglobin-A1c <5.7 % of total Hgb 5.6    Assessment/Plan: 1. Pain due to onychomycosis of nail   2. Coagulation defect [facility])    Patient was evaluated and treated. All patient's and/or POA's questions/concerns addressed on today's visit. Mycotic toenails 1-5 debrided in length and girth without incident. Continue soft, supportive shoe gear daily. Report any pedal injuries to medical professional. Call office if there are any quesitons/concerns. -Patient/POA to call should there be question/concern in the interim.   Return in about 3 months (around 05/15/2024).  Becky Farrell, DPM      [place] LOCATION: [address], [place] [address]                   Office [phone]   [facility] LOCATION: [address] [place], [place] [address] Office [phone]

## 2024-02-19 ENCOUNTER — Telehealth: Payer: Self-pay

## 2024-02-19 DIAGNOSIS — Z7901 Long term (current) use of anticoagulants: Secondary | ICD-10-CM

## 2024-02-19 NOTE — Telephone Encounter (Signed)
 Please let pt be aware her INR was 2.5 to continue current coumadin dosing and follow up INR in 4 weeks (From now since she has been stable on current dose)

## 2024-02-19 NOTE — Telephone Encounter (Signed)
 Patient notified and agreed.  Lab scheduled for 4/17 Order placed and printed in [facility] tray.

## 2024-02-19 NOTE — Telephone Encounter (Signed)
 Copied from CRM [id number]. Topic: Clinical - Lab/Test Results >> Feb 19, 2024  8:03 AM Hamdi H wrote: Reason for CRM: Pt states she had lab drawn on 02/06/24 she wanted to know the results for that lab draw and she wanted to be given a call back. She might need her next blood draw scheduled at [facility] based on the results of that blood draw. If she doesn't answer her home phone she would like a detailed voicemail message left.   Call returned to patient to inquire about where labs were drawn as we do not show any labs from 02/06/24. Patient states she had labs drawn at her [facility] by [facility] on 02/06/2024. I accessed [facility] website and was able to locate PT/INR results,  INR 2.5 and PT 25.2 (abstracted). I printed labs and sent them for scanning.   Message is being routed to Sharon Seller, NP to advise.

## 2024-02-27 ENCOUNTER — Encounter: Payer: Self-pay | Admitting: Medical

## 2024-02-27 ENCOUNTER — Ambulatory Visit: Payer: Medicare Other | Attending: Medical | Admitting: Medical

## 2024-02-27 VITALS — BP 152/94 | HR 69 | Ht 62.0 in | Wt 129.6 lb

## 2024-02-27 DIAGNOSIS — I350 Nonrheumatic aortic (valve) stenosis: Secondary | ICD-10-CM | POA: Diagnosis not present

## 2024-02-27 DIAGNOSIS — I10 Essential (primary) hypertension: Secondary | ICD-10-CM

## 2024-02-27 DIAGNOSIS — Z7901 Long term (current) use of anticoagulants: Secondary | ICD-10-CM | POA: Diagnosis not present

## 2024-02-27 DIAGNOSIS — I340 Nonrheumatic mitral (valve) insufficiency: Secondary | ICD-10-CM | POA: Diagnosis not present

## 2024-02-27 DIAGNOSIS — I471 Supraventricular tachycardia, unspecified: Secondary | ICD-10-CM

## 2024-02-27 DIAGNOSIS — M79604 Pain in right leg: Secondary | ICD-10-CM

## 2024-02-27 DIAGNOSIS — I493 Ventricular premature depolarization: Secondary | ICD-10-CM

## 2024-02-27 DIAGNOSIS — M79605 Pain in left leg: Secondary | ICD-10-CM

## 2024-02-27 NOTE — Patient Instructions (Signed)
 Medication Instructions:  Your physician recommends that you continue on your current medications as directed. Please refer to the Current Medication list given to you today.   *If you need a refill on your cardiac medications before your next appointment, please call your pharmacy*   Lab Work: No labs ordered today    Testing/Procedures: Your physician has requested that you have an echocardiogram. Echocardiography is a painless test that uses sound waves to create images of your heart. It provides your doctor with information about the size and shape of your heart and how well your heart's chambers and valves are working.   You may receive an ultrasound enhancing agent through an IV if needed to better visualize your heart during the echo. This procedure takes approximately one hour.  There are no restrictions for this procedure.  This will take place at [address] [facility] [address] [facility], [place] [address]  Please note: We ask at that you not bring children with you during ultrasound (echo/ vascular) testing. Due to room size and safety concerns, children are not allowed in the ultrasound rooms during exams. Our front office staff cannot provide observation of children in our lobby area while testing is being conducted. An adult accompanying a patient to their appointment will only be allowed in the ultrasound room at the discretion of the ultrasound technician under special circumstances. We apologize for any inconvenience.    Follow-Up: At [facility], you and your health needs are our priority.  As part of our continuing mission to provide you with exceptional heart care, we have created designated Provider Care Teams.  These Care Teams include your primary Cardiologist (physician) and Advanced Practice Providers (APPs -  Physician Assistants and Nurse Practitioners) who all work together to provide you with the care you need, when you need it.  We recommend  signing up for the patient portal called "MyChart".  Sign up information is provided on this After Visit Summary.  MyChart is used to connect with patients for Virtual Visits (Telemedicine).  Patients are able to view lab/test results, encounter notes, upcoming appointments, etc.  Non-urgent messages can be sent to your provider as well.   To learn more about what you can do with MyChart, go to [email or web address].    Your next appointment:   6 month(s)  Provider:   You may see Lorine Bears, MD or one of the following Advanced Practice Providers on your designated Care Team:   Nicolasa Ducking, NP Eula Listen, PA-C Cadence Fransico Michael, PA-C Charlsie [facility], NP Carlos Levering, NP

## 2024-02-27 NOTE — Progress Notes (Unsigned)
 Cardiology Office Note:  .   Date:  02/28/2024  ID:  Becky Farrell, DOB 14-Dec-1933, MRN [id number] PCP: Sharon Seller, NP  [facility] Providers Cardiologist:  Lorine Bears, MD {  History of Present Illness: .   Becky Farrell is a 89 y.o. female  with a hx of PVCs, SVT, palpitations, dizziness, PE on warfarin, osteoporosis, hypertension, right breast cancer status postlumpectomy and radiation, mild aortic stenosis presents for follow-up.  Her and her husband reside at [facility].   Seen by Dr. Kirke Corin 02/01/2021 reported mild exertional dyspnea and PVCs.  Murmur was noted on exam.  ZIO and echo were recommended.  Her ZIO showed dominantly normal sinus rhythm with occasional PACs with a 2.7 burden, PVCs with a 2.7 burden, 35 runs of SVT fastest 5 beats with 187 bpm and longest 16.5 seconds with a rate of 110 bpm.  Echo showed LVEF 50 to 55%, no wall motion abnormality, mildly elevated PASP, bilateral atrial mildly dilated, mild MR, mild AS.   Seen 03/10/2021 and overall feeling well with occasional palpitations.  Atenolol was transitioned to metoprolol   ABI's in 2022 showed no significant disease bilaterally.   Patient was last seen in November 2024 and was stable from a cardiac perspective.  Today, BP is very high, but improved on second check, 152/94. She feels lightheaded at times, which may be from elevated BP. Says she is going through stress at home, but this is improving/ She has been told he has vertigo, which may also be contributing to symptoms. She denies chest pain or shortness of breath.    Studies Reviewed: Marland Kitchen   EKG Interpretation Date/Time:  Thursday February 27 2024 14:12:01 EDT Ventricular Rate:  69 PR Interval:  170 QRS Duration:  86 QT Interval:  404 QTC Calculation: 432 R Axis:   -24  Text Interpretation: Normal sinus rhythm Moderate voltage criteria for LVH, may be normal variant ( R in aVL , Cornell product ) Nonspecific ST abnormality When compared with  ECG of 11-Dec-2023 17:51, No significant change was found Confirmed by Fransico Michael, Jontrell Bushong ([id number]) on 02/27/2024 2:18:43 PM    ABIs 2022 Summary:  Right: Resting right ankle-brachial index is within normal range. No  evidence of significant right lower extremity arterial disease. The right  toe-brachial index is normal.   Left: Resting left ankle-brachial index is within normal range. No  evidence of significant left lower extremity arterial disease. The left  toe-brachial index is normal.    Echo 01/2021  1. Left ventricular ejection fraction, by estimation, is 50 to 55%. The  left ventricle has low normal function. The left ventricle has no regional  wall motion abnormalities. Left ventricular diastolic parameters are  indeterminate.   2. Right ventricular systolic function is low normal. The right  ventricular size is normal. There is mildly elevated pulmonary artery  systolic pressure.   3. Left atrial size was mildly dilated.   4. Right atrial size was mildly dilated.   5. The mitral valve is degenerative. Mild mitral valve regurgitation.   6. The aortic valve is tricuspid. Aortic valve regurgitation is mild.  Mild aortic valve sclerosis is present, with no evidence of aortic valve  stenosis.   7. The inferior vena cava is normal in size with greater than 50%  respiratory variability, suggesting right atrial pressure of 3 mmHg.    HYPERTENSION CONTROL Vitals:   02/27/24 1406 02/27/24 1435  BP: (!) 177/95 (!) 152/94    The patient's  blood pressure is elevated above target today.  In order to address the patient's elevated BP: A current anti-hypertensive medication was adjusted today.          Physical Exam:   VS:  BP (!) 152/94   Pulse 69   Ht 5\' 2"  (1.575 m)   Wt 129 lb 9.6 oz (58.8 kg)   SpO2 96%   BMI 23.70 kg/m    Wt Readings from Last 3 Encounters:  02/27/24 129 lb 9.6 oz (58.8 kg)  02/13/24 132 lb (59.9 kg)  12/19/23 132 lb (59.9 kg)    GEN: Well nourished,  well developed in no acute distress NECK: No JVD; No carotid bruits CARDIAC: RRR, no murmurs, rubs, gallops RESPIRATORY:  Clear to auscultation without rales, wheezing or rhonchi  ABDOMEN: Soft, non-tender, non-distended EXTREMITIES:  No edema; No deformity   ASSESSMENT AND PLAN: .    HTN BP elevated today. She is reluctant to increase lisinopril. We will continue lisinopril 30mg  daily and lopressor 25mg  BID. She will check BP at home and call in if it's still high.   Chronic anticoagulation The patient is on warfarin for h/o PE.   Mild AS/MR Echo in 2022 showed LVEF 50-55% with mild MR and mild AI. I will update an echocardiogram.   PVC/PAC/pSVT She reports occasional palpitations. Continue metoprolol 25mg  BID.   Leg pain ABIs in 2022 were normal. She is following with PCP and podiatry for further work-up.        Dispo: Follow-up in 6 months  Signed, Quamel Fitzmaurice David Stall, PA-C

## 2024-02-28 ENCOUNTER — Encounter: Payer: Self-pay | Admitting: Oncology

## 2024-03-17 ENCOUNTER — Inpatient Hospital Stay: Payer: Medicare Other | Attending: Oncology

## 2024-03-17 ENCOUNTER — Inpatient Hospital Stay: Payer: Medicare Other

## 2024-03-17 DIAGNOSIS — Z85820 Personal history of malignant melanoma of skin: Secondary | ICD-10-CM | POA: Diagnosis not present

## 2024-03-17 DIAGNOSIS — Z923 Personal history of irradiation: Secondary | ICD-10-CM | POA: Insufficient documentation

## 2024-03-17 DIAGNOSIS — Z803 Family history of malignant neoplasm of breast: Secondary | ICD-10-CM | POA: Insufficient documentation

## 2024-03-17 DIAGNOSIS — Z170 Estrogen receptor positive status [ER+]: Secondary | ICD-10-CM | POA: Diagnosis not present

## 2024-03-17 DIAGNOSIS — Z79811 Long term (current) use of aromatase inhibitors: Secondary | ICD-10-CM | POA: Diagnosis not present

## 2024-03-17 DIAGNOSIS — C50211 Malignant neoplasm of upper-inner quadrant of right female breast: Secondary | ICD-10-CM | POA: Diagnosis not present

## 2024-03-17 DIAGNOSIS — Z86711 Personal history of pulmonary embolism: Secondary | ICD-10-CM | POA: Insufficient documentation

## 2024-03-17 DIAGNOSIS — M810 Age-related osteoporosis without current pathological fracture: Secondary | ICD-10-CM | POA: Insufficient documentation

## 2024-03-17 DIAGNOSIS — Z7901 Long term (current) use of anticoagulants: Secondary | ICD-10-CM | POA: Diagnosis not present

## 2024-03-17 LAB — BASIC METABOLIC PANEL - CANCER CENTER ONLY
Anion gap: 8 (ref 5–15)
BUN: 21 mg/dL (ref 8–23)
CO2: 24 mmol/L (ref 22–32)
Calcium: 9.5 mg/dL (ref 8.9–10.3)
Chloride: 103 mmol/L (ref 98–111)
Creatinine: 0.67 mg/dL (ref 0.44–1.00)
GFR, Estimated: >60 mL/min (ref >60)
Glucose, Bld: 158 mg/dL — ABNORMAL HIGH (ref 70–99)
Potassium: 4.1 mmol/L (ref 3.5–5.1)
Sodium: 135 mmol/L (ref 135–145)

## 2024-03-17 MED ORDER — DENOSUMAB 60 MG/ML ~~LOC~~ SOSY
60.0000 mg | PREFILLED_SYRINGE | Freq: Once | SUBCUTANEOUS | Status: AC
  Administered 2024-03-17: 60 mg via SUBCUTANEOUS
  Filled 2024-03-17: qty 1

## 2024-03-19 ENCOUNTER — Other Ambulatory Visit: Payer: Self-pay | Admitting: Student

## 2024-03-19 LAB — PROTIME-INR
INR: 2.2 — ABNORMAL HIGH
Prothrombin Time: 22.2 s — ABNORMAL HIGH (ref 9.0–11.5)

## 2024-03-20 ENCOUNTER — Encounter: Payer: Self-pay | Admitting: Student

## 2024-03-23 ENCOUNTER — Telehealth: Payer: Self-pay | Admitting: *Deleted

## 2024-03-23 DIAGNOSIS — Z7901 Long term (current) use of anticoagulants: Secondary | ICD-10-CM

## 2024-03-23 NOTE — Telephone Encounter (Signed)
 Patient notified and agreed.  Scheduled to have PT/INR Drawn 04/23/2024 at home.  Order placed and printed and placed in tray at [facility].

## 2024-03-23 NOTE — Telephone Encounter (Signed)
 Tried calling patient regarding her PT Results from 4/17 and to schedule a 1 Month PT/INR Lab draw to be done at [facility].  LMOM to return call.   Per Dr. Jann Melody: INR is at goal. Continue current regimen.   Written by Valrie Gehrig, MD on 03/20/2024  8:33 AM EDT

## 2024-03-25 ENCOUNTER — Ambulatory Visit: Attending: Medical

## 2024-03-25 DIAGNOSIS — I350 Nonrheumatic aortic (valve) stenosis: Secondary | ICD-10-CM

## 2024-03-25 DIAGNOSIS — I340 Nonrheumatic mitral (valve) insufficiency: Secondary | ICD-10-CM | POA: Diagnosis not present

## 2024-03-25 LAB — ECHOCARDIOGRAM COMPLETE
AR max vel: 1.08 cm2
AV Area VTI: 1.06 cm2
AV Area mean vel: 1.09 cm2
AV Mean grad: 16 mmHg
AV Peak grad: 30 mmHg
Ao pk vel: 2.74 m/s
Area-P 1/2: 2.66 cm2
S' Lateral: 3.08 cm

## 2024-03-31 ENCOUNTER — Encounter: Payer: Self-pay | Admitting: Dermatology

## 2024-03-31 ENCOUNTER — Ambulatory Visit (INDEPENDENT_AMBULATORY_CARE_PROVIDER_SITE_OTHER): Payer: Medicare Other | Admitting: Dermatology

## 2024-03-31 DIAGNOSIS — B353 Tinea pedis: Secondary | ICD-10-CM

## 2024-03-31 DIAGNOSIS — Z1283 Encounter for screening for malignant neoplasm of skin: Secondary | ICD-10-CM

## 2024-03-31 DIAGNOSIS — L304 Erythema intertrigo: Secondary | ICD-10-CM

## 2024-03-31 DIAGNOSIS — L578 Other skin changes due to chronic exposure to nonionizing radiation: Secondary | ICD-10-CM | POA: Diagnosis not present

## 2024-03-31 DIAGNOSIS — H0012 Chalazion right lower eyelid: Secondary | ICD-10-CM

## 2024-03-31 DIAGNOSIS — D229 Melanocytic nevi, unspecified: Secondary | ICD-10-CM

## 2024-03-31 DIAGNOSIS — L814 Other melanin hyperpigmentation: Secondary | ICD-10-CM

## 2024-03-31 DIAGNOSIS — L720 Epidermal cyst: Secondary | ICD-10-CM

## 2024-03-31 DIAGNOSIS — W908XXA Exposure to other nonionizing radiation, initial encounter: Secondary | ICD-10-CM

## 2024-03-31 DIAGNOSIS — L821 Other seborrheic keratosis: Secondary | ICD-10-CM

## 2024-03-31 DIAGNOSIS — L729 Follicular cyst of the skin and subcutaneous tissue, unspecified: Secondary | ICD-10-CM

## 2024-03-31 DIAGNOSIS — I8393 Asymptomatic varicose veins of bilateral lower extremities: Secondary | ICD-10-CM

## 2024-03-31 DIAGNOSIS — D1801 Hemangioma of skin and subcutaneous tissue: Secondary | ICD-10-CM

## 2024-03-31 DIAGNOSIS — L988 Other specified disorders of the skin and subcutaneous tissue: Secondary | ICD-10-CM

## 2024-03-31 DIAGNOSIS — H0019 Chalazion unspecified eye, unspecified eyelid: Secondary | ICD-10-CM

## 2024-03-31 DIAGNOSIS — Z85820 Personal history of malignant melanoma of skin: Secondary | ICD-10-CM

## 2024-03-31 DIAGNOSIS — L719 Rosacea, unspecified: Secondary | ICD-10-CM

## 2024-03-31 DIAGNOSIS — L853 Xerosis cutis: Secondary | ICD-10-CM

## 2024-03-31 DIAGNOSIS — B351 Tinea unguium: Secondary | ICD-10-CM

## 2024-03-31 MED ORDER — KETOCONAZOLE 2 % EX CREA: TOPICAL_CREAM | CUTANEOUS | 11 refills | Status: AC

## 2024-03-31 NOTE — Progress Notes (Signed)
 Follow-Up Visit   Subjective  Becky Farrell is a 89 y.o. female who presents for the following: Skin Cancer Screening and Full Body Skin Exam. Hx of MM.   Discoloration under breasts. States the topical cream does not help with the discoloration. Area does not itch or feel irritated.   The patient presents for Total-Body Skin Exam (TBSE) for skin cancer screening and mole check. The patient has spots, moles and lesions to be evaluated, some may be new or changing and the patient has concerns that these could be cancer.    The following portions of the chart were reviewed this encounter and updated as appropriate: medications, allergies, medical history  Review of Systems:  No other skin or systemic complaints except as noted in HPI or Assessment and Plan.  Objective  Well appearing patient in no apparent distress; mood and affect are within normal limits.  A full examination was performed including scalp, head, eyes, ears, nose, lips, neck, chest, axillae, abdomen, back, buttocks, bilateral upper extremities, bilateral lower extremities, hands, feet, fingers, toes, fingernails, and toenails. All findings within normal limits unless otherwise noted below.   Relevant physical exam findings are noted in the Assessment and Plan.  Right Inframammary Fold/Intermammary Yellow-tan waxy patch   Assessment & Plan   LENTIGINES, SEBORRHEIC KERATOSES, HEMANGIOMAS - Benign normal skin lesions - Benign-appearing - Call for any changes  MELANOCYTIC NEVI - Tan-brown and/or pink-flesh-colored symmetric macules and papules - Benign appearing on exam today - Observation - Call clinic for new or changing moles - Recommend daily use of broad spectrum spf 30+ sunscreen to sun-exposed areas.   ACTINIC DAMAGE - Chronic condition, secondary to cumulative UV/sun exposure - diffuse scaly erythematous macules with underlying dyspigmentation - Recommend daily broad spectrum sunscreen SPF 30+ to  sun-exposed areas, reapply every 2 hours as needed.  - Staying in the shade or wearing long sleeves, sun glasses (UVA+UVB protection) and wide brim hats (4-inch brim around the entire circumference of the hat) are also recommended for sun protection.  - Call for new or changing lesions.  SKIN CANCER SCREENING PERFORMED TODAY.  HISTORY OF MELANOMA - No evidence of recurrence today of the right lower leg (2003) - Recommend regular full body skin exams - Recommend daily broad spectrum sunscreen SPF 30+ to sun-exposed areas, reapply every 2 hours as needed.  - Call if any new or changing lesions are noted between office visits  EPIDERMAL INCLUSION CYST Exam: Subcutaneous nodule at right lateral infra ocular area  Benign-appearing. Exam most consistent with an epidermal inclusion cyst. Discussed that a cyst is a benign growth that can grow over time and sometimes get irritated or inflamed. Recommend observation if it is not bothersome. Discussed option of surgical excision to remove it if it is growing, symptomatic, or other changes noted. Please call for new or changing lesions so they can be evaluated.  ROSACEA Exam Mid face erythema with telangiectasias   Chronic condition with duration or expected duration over one year. Currently well-controlled.   Rosacea is a chronic progressive skin condition usually affecting the face of adults, causing redness and/or acne bumps. It is treatable but not curable. It sometimes affects the eyes (ocular rosacea) as well. It may respond to topical and/or systemic medication and can flare with stress, sun exposure, alcohol, exercise, topical steroids (including hydrocortisone/cortisone 10) and some foods.  Daily application of broad spectrum spf 30+ sunscreen to face is recommended to reduce flares.  Treatment Plan Continue metronidazole  0.75% cream  Apply to face QD/BID for rosacea. Patient has at home.    INTERTRIGO Exam: Inframammary clear  today  Chronic condition with duration or expected duration over one year. Currently well-controlled.   Intertrigo is a chronic recurrent rash that occurs in skin fold areas that may be associated with friction; heat; moisture; yeast; fungus; and bacteria.  It is exacerbated by increased movement / activity; sweating; and higher atmospheric temperature.  Treatment Plan continue Ketoconazole  2% Cream Apply under breasts once to twice daily prn flares   Xerosis - diffuse xerotic patches on the legs, back - recommend gentle, hydrating skin care - gentle skin care handout given  Varicose Veins/Spider Veins - Dilated blue, purple or red veins at the lower extremities - Reassured - Smaller vessels can be treated by sclerotherapy (a procedure to inject a medicine into the veins to make them disappear) if desired, but the treatment is not covered by insurance. Larger vessels may be covered if symptomatic and we would refer to vascular surgeon if treatment desired.  SEBORRHEIC KERATOSIS - Stuck-on, waxy, tan-brown papule at right calf - Benign-appearing - Discussed benign etiology and prognosis. - Observe - Call for any changes  ONYCHOMYCOSIS with Tinea Pedis Exam: Thickened yellow toenails with subungal debris c/w onychomycosis, mild scaling at left plantar ball of foot.  Web spaces clear.  Chronic and persistent condition with duration or expected duration over one year. Condition is symptomatic/ bothersome to patient. Not currently at goal.  Skin improved.  Treatment Plan: Continue Ketoconazole  2% cream every night at bedtime to feet and between toes.     Chalzion vs other Exam: 3 mm pink papule at mucosal lateral lower eyelid. New lesion when compared to previous photo.  New photo today  Treatment: Continue warm compresses. Watch for changes. Follow up with Dr Ignatius Makos.     TERRA FIRMA-FORME DERMATOSIS Right Inframammary Fold/Intermammary Benign. Wiped off with alcohol swab  in office.  ERYTHEMA INTERTRIGO   Related Medications ketoconazole  (NIZORAL ) 2 % cream Apply between toes and around toenails every night.  Return in about 1 year (around 03/31/2025) for TBSE, HxMM.  Documentation: I have reviewed the above documentation for accuracy and completeness, and I agree with the above.  Artemio Larry, MD

## 2024-03-31 NOTE — Patient Instructions (Addendum)
 Continue Ketoconazole  2% cream every night at bedtime to feet and between toes.     Recommend daily broad spectrum sunscreen SPF 30+ to sun-exposed areas, reapply every 2 hours as needed. Call for new or changing lesions.  Staying in the shade or wearing long sleeves, sun glasses (UVA+UVB protection) and wide brim hats (4-inch brim around the entire circumference of the hat) are also recommended for sun protection.      Melanoma ABCDEs  Melanoma is the most dangerous type of skin cancer, and is the leading cause of death from skin disease.  You are more likely to develop melanoma if you: Have light-colored skin, light-colored eyes, or red or blond hair Spend a lot of time in the sun Tan regularly, either outdoors or in a tanning bed Have had blistering sunburns, especially during childhood Have a close family member who has had a melanoma Have atypical moles or large birthmarks  Early detection of melanoma is key since treatment is typically straightforward and cure rates are extremely high if we catch it early.   The first sign of melanoma is often a change in a mole or a new dark spot.  The ABCDE system is a way of remembering the signs of melanoma.  A for asymmetry:  The two halves do not match. B for border:  The edges of the growth are irregular. C for color:  A mixture of colors are present instead of an even brown color. D for diameter:  Melanomas are usually (but not always) greater than 6mm - the size of a pencil eraser. E for evolution:  The spot keeps changing in size, shape, and color.  Please check your skin once per month between visits. You can use a small mirror in front and a large mirror behind you to keep an eye on the back side or your body.   If you see any new or changing lesions before your next follow-up, please call to schedule a visit.  Please continue daily skin protection including broad spectrum sunscreen SPF 30+ to sun-exposed areas, reapplying every 2  hours as needed when you're outdoors.   Staying in the shade or wearing long sleeves, sun glasses (UVA+UVB protection) and wide brim hats (4-inch brim around the entire circumference of the hat) are also recommended for sun protection.      Due to recent changes in healthcare laws, you may see results of your pathology and/or laboratory studies on MyChart before the doctors have had a chance to review them. We understand that in some cases there may be results that are confusing or concerning to you. Please understand that not all results are received at the same time and often the doctors may need to interpret multiple results in order to provide you with the best plan of care or course of treatment. Therefore, we ask that you please give us  2 business days to thoroughly review all your results before contacting the office for clarification. Should we see a critical lab result, you will be contacted sooner.   If You Need Anything After Your Visit  If you have any questions or concerns for your doctor, please call our main line at [phone] and press option 4 to reach your doctor's medical assistant. If no one answers, please leave a voicemail as directed and we will return your call as soon as possible. Messages left after 4 pm will be answered the following business day.   You may also send us  a message via MyChart.  We typically respond to MyChart messages within 1-2 business days.  For prescription refills, please ask your pharmacy to contact our office. Our fax number is [phone].  If you have an urgent issue when the clinic is closed that cannot wait until the next business day, you can page your doctor at the number below.    Please note that while we do our best to be available for urgent issues outside of office hours, we are not available 24/7.   If you have an urgent issue and are unable to reach us , you may choose to seek medical care at your doctor's office, retail clinic,  urgent care center, or emergency room.  If you have a medical emergency, please immediately call 911 or go to the emergency department.  Pager Numbers  - Dr. Bary Likes: [phone]  - Dr. Annette Barters: [phone]  - Dr. Felipe Horton: [phone]   In the event of inclement weather, please call our main line at [phone] for an update on the status of any delays or closures.  Dermatology Medication Tips: Please keep the boxes that topical medications come in in order to help keep track of the instructions about where and how to use these. Pharmacies typically print the medication instructions only on the boxes and not directly on the medication tubes.   If your medication is too expensive, please contact our office at [phone] option 4 or send us  a message through MyChart.   We are unable to tell what your co-pay for medications will be in advance as this is different depending on your insurance coverage. However, we may be able to find a substitute medication at lower cost or fill out paperwork to get insurance to cover a needed medication.   If a prior authorization is required to get your medication covered by your insurance company, please allow us  1-2 business days to complete this process.  Drug prices often vary depending on where the prescription is filled and some pharmacies may offer cheaper prices.  The website www.goodrx.com contains coupons for medications through different pharmacies. The prices here do not account for what the cost may be with help from insurance (it may be cheaper with your insurance), but the website can give you the price if you did not use any insurance.  - You can print the associated coupon and take it with your prescription to the pharmacy.  - You may also stop by our office during regular business hours and pick up a GoodRx coupon card.  - If you need your prescription sent electronically to a different pharmacy, notify our office through [facility] or by phone at [phone] option 4.     Si Usted Necesita Algo Despus de Su Visita  Tambin puede enviarnos un mensaje a travs de Clinical cytogeneticist. Por lo general respondemos a los mensajes de MyChart en el transcurso de 1 a 2 das hbiles.  Para renovar recetas, por favor pida a su farmacia que se ponga en contacto con nuestra oficina. Franz Jacks de fax es [place] [phone].  Si tiene un asunto urgente cuando la clnica est cerrada y que no puede esperar hasta el siguiente da hbil, puede llamar/localizar a su doctor(a) al nmero que aparece a continuacin.   Por favor, tenga en cuenta que aunque hacemos todo lo posible para estar disponibles para asuntos urgentes fuera del horario de [place], no estamos disponibles las 24 horas del da, los 7 [address]  [address] de la [place].   Si tiene un problema urgente y no  puede comunicarse con nosotros, puede optar por buscar atencin mdica  en el consultorio de su doctor(a), en una clnica privada, en un centro de atencin urgente o en una sala de emergencias.  Si tiene Engineer, drilling, por favor llame inmediatamente al 911 o vaya a la sala de emergencias.  Nmeros de bper  - Dr. Bary Likes: [phone]  - Dra. Annette Barters: [phone]  - Dr. Felipe Horton: [phone]   En caso de inclemencias del tiempo, por favor llame a Lajuan Pila principal al [phone] para una actualizacin sobre el [place] de cualquier retraso o cierre.  Consejos para la medicacin en dermatologa: Por favor, guarde las cajas en las que vienen los medicamentos de uso tpico para ayudarle a seguir las instrucciones sobre dnde y cmo usarlos. Las farmacias generalmente imprimen las instrucciones del medicamento slo en las cajas y no directamente en los tubos del [place].   Si su medicamento es muy caro, por favor, pngase en contacto con Bettyjane Brunet llamando al [phone] y presione la opcin 4 o envenos un mensaje a travs de Clinical cytogeneticist.   No podemos decirle cul  ser su copago por los medicamentos por adelantado ya que esto es diferente dependiendo de la cobertura de su seguro. Sin embargo, es posible que podamos encontrar un medicamento sustituto a Audiological scientist un formulario para que el seguro cubra el medicamento que se considera necesario.   Si se requiere una autorizacin previa para que su compaa de seguros [place] su medicamento, por favor permtanos de 1 a 2 das hbiles para completar este proceso.  Los precios de los medicamentos varan con frecuencia dependiendo del Environmental consultant de dnde se surte la receta y alguna farmacias pueden ofrecer precios ms baratos.  El sitio web www.goodrx.com tiene cupones para medicamentos de Health and safety inspector. Los precios aqu no tienen en cuenta lo que podra costar con la ayuda del seguro (puede ser ms barato con su seguro), pero el sitio web puede darle el precio si no utiliz Tourist information centre manager.  - Puede imprimir el cupn correspondiente y llevarlo con su receta a la farmacia.  - Tambin puede pasar por nuestra oficina durante el horario de atencin regular y Education officer, museum una tarjeta de cupones de GoodRx.  - Si necesita que su receta se enve electrnicamente a una farmacia diferente, informe a nuestra oficina a travs de MyChart de [place] o por telfono llamando al [phone] y presione la opcin 4.

## 2024-03-31 NOTE — Progress Notes (Deleted)
 Follow-Up Visit   Subjective  Becky Farrell is a 89 y.o. female who presents for the following: Skin Cancer Screening and Full Body Skin Exam  The patient presents for Total-Body Skin Exam (TBSE) for skin cancer screening and mole check. The patient has spots, moles and lesions to be evaluated, some may be new or changing and the patient may have concern these could be cancer.    The following portions of the chart were reviewed this encounter and updated as appropriate: medications, allergies, medical history  Review of Systems:  No other skin or systemic complaints except as noted in HPI or Assessment and Plan.  Objective  Well appearing patient in no apparent distress; mood and affect are within normal limits.  A full examination was performed including scalp, head, eyes, ears, nose, lips, neck, chest, axillae, abdomen, back, buttocks, bilateral upper extremities, bilateral lower extremities, hands, feet, fingers, toes, fingernails, and toenails. All findings within normal limits unless otherwise noted below.   Relevant physical exam findings are noted in the Assessment and Plan.    Assessment & Plan   SKIN CANCER SCREENING PERFORMED TODAY.  ACTINIC DAMAGE - Chronic condition, secondary to cumulative UV/sun exposure - diffuse scaly erythematous macules with underlying dyspigmentation - Recommend daily broad spectrum sunscreen SPF 30+ to sun-exposed areas, reapply every 2 hours as needed.  - Staying in the shade or wearing long sleeves, sun glasses (UVA+UVB protection) and wide brim hats (4-inch brim around the entire circumference of the hat) are also recommended for sun protection.  - Call for new or changing lesions.  LENTIGINES, SEBORRHEIC KERATOSES, HEMANGIOMAS - Benign normal skin lesions - Benign-appearing - Call for any changes  MELANOCYTIC NEVI - Tan-brown and/or pink-flesh-colored symmetric macules and papules - Benign appearing on exam today - Observation -  Call clinic for new or changing moles - Recommend daily use of broad spectrum spf 30+ sunscreen to sun-exposed areas.   ROSACEA Exam Mid face erythema with telangiectasias    Chronic and persistent condition with duration or expected duration over one year. Condition is symptomatic/ bothersome to patient. Not currently at goal, but improving.     Rosacea is a chronic progressive skin condition usually affecting the face of adults, causing redness and/or acne bumps. It is treatable but not curable. It sometimes affects the eyes (ocular rosacea) as well. It may respond to topical and/or systemic medication and can flare with stress, sun exposure, alcohol, exercise, topical steroids (including hydrocortisone/cortisone 10) and some foods.  Daily application of broad spectrum spf 30+ sunscreen to face is recommended to reduce flares.     Treatment Plan Continue metronidazole  0.75% cream Apply to face QD/BID for rosacea. Patient has at home.    INTERTRIGO with PIH Exam Hyperpigmented lichenified patch of the right inframammary   Chronic and persistent condition with duration or expected duration over one year. Condition is symptomatic/ bothersome to patient. Not currently at goal.    Intertrigo is a chronic recurrent rash that occurs in skin fold areas that may be associated with friction; heat; moisture; yeast; fungus; and bacteria.  It is exacerbated by increased movement / activity; sweating; and higher atmospheric temperature.   Treatment Plan Start Ketoconazole  2% Cream Apply under breasts once to twice daily for rash.      No follow-ups on file.  I, Yola Paradiso, CMA, am acting as scribe for Artemio Larry, MD.   Documentation: I have reviewed the above documentation for accuracy and completeness, and I agree with the  above.  Artemio Larry, MD

## 2024-04-14 ENCOUNTER — Ambulatory Visit: Payer: Medicare Other | Admitting: Nurse Practitioner

## 2024-04-14 ENCOUNTER — Encounter: Payer: Self-pay | Admitting: Nurse Practitioner

## 2024-04-14 VITALS — BP 136/84 | HR 77 | Temp 97.4°F | Ht 62.0 in | Wt 130.0 lb

## 2024-04-14 DIAGNOSIS — Z853 Personal history of malignant neoplasm of breast: Secondary | ICD-10-CM

## 2024-04-14 DIAGNOSIS — M810 Age-related osteoporosis without current pathological fracture: Secondary | ICD-10-CM | POA: Diagnosis not present

## 2024-04-14 DIAGNOSIS — Z7901 Long term (current) use of anticoagulants: Secondary | ICD-10-CM

## 2024-04-14 DIAGNOSIS — G609 Hereditary and idiopathic neuropathy, unspecified: Secondary | ICD-10-CM | POA: Diagnosis not present

## 2024-04-14 DIAGNOSIS — Z86711 Personal history of pulmonary embolism: Secondary | ICD-10-CM | POA: Diagnosis not present

## 2024-04-14 DIAGNOSIS — K5904 Chronic idiopathic constipation: Secondary | ICD-10-CM

## 2024-04-14 DIAGNOSIS — I10 Essential (primary) hypertension: Secondary | ICD-10-CM

## 2024-04-14 NOTE — Progress Notes (Signed)
 Careteam: Patient Care Team: Verma Gobble, NP as PCP - General (Geriatric Medicine) Wenona Hamilton, MD as PCP - Cardiology (Cardiology) Glenis Langdon, MD as Radiation Oncologist (Radiation Oncology) Timmy Forbes, MD as Consulting Physician (Oncology) Annell Kidney, MD as Referring Physician (Ophthalmology) Durward Gillis, PA-C as Physician Assistant (Cardiology) PLACE OF SERVICE:  [facility]   Advanced Directive information Does Patient Have a Medical Advance Directive?: Yes, Type of Advance Directive: Living will;Healthcare Power of Attorney, Does patient want to make changes to medical advance directive?: No - Patient declined  Allergies  Allergen Reactions   Clobetasol Other (See Comments)    vertigo   Clobetasol Propionate Other (See Comments)    This is ointment form UNSPECIFIED REACTION    Phenergan [Promethazine Hcl] Other (See Comments)    Rapid heart rate   Dextromethorphan Palpitations and Other (See Comments)    promethazine with DM, rapid hearbeat   Promethazine Other (See Comments) and Palpitations    Rapid heart bea   Zithromax [Azithromycin] Palpitations and Other (See Comments)    Per OSH report, pt unable to recall Rapid heart rate    Chief Complaint  Patient presents with   Medical Management of Chronic Issues    Medical Management of Chronic Issues. 6 Month follow up     Discussed the use of AI scribe software for clinical note transcription with the patient, who gave verbal consent to proceed.  History of Present Illness Becky Farrell is an 89 year old female who presents for a routine follow-up.  She experiences ongoing lightheadedness and dizziness, which occur with positional changes and are accompanied by a sensation of spinning. No falls or shortness of breath. She is under the care of a neurologist for these symptoms.  She is scheduled for an evaluation of numbness and tingling in her right calf and left foot. She  experiences tingling in her toes and hands and is awaiting further examination to assess circulation in her legs.  She has a history of PVCs, SVT, and palpitations, and is currently on warfarin due to a history of pulmonary embolism. Her INR was recently measured at 2.2. She takes warfarin 5 mg daily, except on Mondays and Thursdays when she takes 7.5 mg. She also takes lisinopril  30 mg and metoprolol  25 mg twice daily for hypertension, which was elevated at a recent cardiology visit but is currently well-controlled.   She has a history of breast cancer and is under the care of an oncologist. She receives Prolia  for osteoporosis, administered by her oncologist, and recently had a skin cancer screening with a dermatologist. She uses Miralax for constipation, which is effective.  She mentions a congenital bone deformity and notes that she is not currently using hearing aids.  Review of Systems:  Review of Systems  Constitutional:  Negative for chills, fever and weight loss.  HENT:  Negative for tinnitus.   Respiratory:  Negative for cough, sputum production and shortness of breath.   Cardiovascular:  Negative for chest pain, palpitations and leg swelling.  Gastrointestinal:  Negative for abdominal pain, constipation, diarrhea and heartburn.  Genitourinary:  Negative for dysuria, frequency and urgency.  Musculoskeletal:  Negative for back pain, falls, joint pain and myalgias.  Skin: Negative.   Neurological:  Positive for tingling and weakness. Negative for dizziness and headaches.  Psychiatric/Behavioral:  Negative for depression and memory loss. The patient does not have insomnia.     Past Medical History:  Diagnosis Date  Colon polyps    Family history of breast cancer    History of pulmonary embolism 2011   Hx of melanoma of skin 2003   Right leg   Hypertension    Melanoma (HCC) 2003   right lower leg. Treated in [place].   Personal history of radiation therapy    Seizures (HCC) 1993    no meds since (approx) 2014   Vertigo    x1. R/T perforated ear drum prior to 2013   Past Surgical History:  Procedure Laterality Date   ABDOMINAL HYSTERECTOMY  05/2014   BREAST BIOPSY Right 08/20/2018   invasive mammary carcinoma/DCIS    BREAST LUMPECTOMY WITH RADIOACTIVE SEED LOCALIZATION Right 10/10/2018   Procedure: BREAST LUMPECTOMY WITH RADIOACTIVE SEED LOCALIZATION;  Surgeon: Caralyn Chandler, MD;  Location: [facility] OR;  Service: General;  Laterality: Right;   CATARACT EXTRACTION W/PHACO Right 01/06/2018   Procedure: CATARACT EXTRACTION PHACO AND INTRAOCULAR LENS PLACEMENT (IOC) RIGHT;  Surgeon: Annell Kidney, MD;  Location: [facility];  Service: Ophthalmology;  Laterality: Right;   CATARACT EXTRACTION W/PHACO Left 01/29/2018   Procedure: CATARACT EXTRACTION PHACO AND INTRAOCULAR LENS PLACEMENT (IOC) LEFT;  Surgeon: Annell Kidney, MD;  Location: [facility];  Service: Ophthalmology;  Laterality: Left;   left hand surgery  05/2011   s/p fall   OTHER SURGICAL HISTORY  2003   Removal of lymphoma from melonoma 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    Social History:   reports that she has never smoked. She has never used smokeless tobacco. She reports that she does not drink alcohol and does not use drugs.  Family History  Problem Relation Age of Onset   Heart disease Mother    Aortic aneurysm Father    Breast cancer Daughter 86   Breast cancer Niece 5    Medications: Patient's Medications  New Prescriptions   No medications on file  Previous Medications   ASPIRIN  81 MG TABLET    Take 81 mg by mouth daily.   CHOLECALCIFEROL (VITAMIN D3) 2000 UNITS TABS    Take 2,000 Units by mouth daily.    DENOSUMAB  (PROLIA ) 60 MG/ML SOSY INJECTION    Inject 60 mg into the skin every 6 (six) months.   KETOCONAZOLE  (NIZORAL ) 2 % CREAM    Apply between toes and around toenails every night.   LISINOPRIL  (ZESTRIL ) 30 MG TABLET    TAKE 1 TABLET BY MOUTH EVERY DAY   METOPROLOL  TARTRATE  (LOPRESSOR ) 25 MG TABLET    TAKE 1 TABLET BY MOUTH TWICE A DAY   METRONIDAZOLE  (METROCREAM ) 0.75 % CREAM    Apply to nose every night for rosacea.   POLYETHYLENE GLYCOL (MIRALAX / GLYCOLAX) PACKET    Take 17 g by mouth every other day.   VITAMIN B-12 (CYANOCOBALAMIN ) 1000 MCG TABLET    Take 1,000 mcg by mouth daily.   WARFARIN (COUMADIN ) 5 MG TABLET    Take 5mg  daily except Monday and Thursday take 7.5mg .   WHEAT DEXTRIN (BENEFIBER PO)    Take 2 each by mouth every other day.  Modified Medications   No medications on file  Discontinued Medications   No medications on file    Physical Exam:  Vitals:   04/14/24 0954  BP: 136/84  Pulse: 77  Temp: (!) 97.4 F (36.3 C)  SpO2: 98%  Weight: 130 lb (59 kg)  Height: 5\' 2"  (1.575 m)   Body mass index is 23.78 kg/m. Wt Readings from Last 3 Encounters:  04/14/24 130 lb (59  kg)  02/27/24 129 lb 9.6 oz (58.8 kg)  02/13/24 132 lb (59.9 kg)    Physical Exam Constitutional:      General: She is not in acute distress.    Appearance: She is well-developed. She is not diaphoretic.  HENT:     Head: Normocephalic and atraumatic.     Mouth/Throat:     Pharynx: No oropharyngeal exudate.  Eyes:     Conjunctiva/sclera: Conjunctivae normal.     Pupils: Pupils are equal, round, and reactive to light.  Cardiovascular:     Rate and Rhythm: Normal rate and regular rhythm.     Heart sounds: Normal heart sounds.  Pulmonary:     Effort: Pulmonary effort is normal.     Breath sounds: Normal breath sounds.  Abdominal:     General: Bowel sounds are normal.     Palpations: Abdomen is soft.  Musculoskeletal:     Cervical back: Normal range of motion and neck supple.     Right lower leg: No edema.     Left lower leg: No edema.  Skin:    General: Skin is warm and dry.  Neurological:     Mental Status: She is alert.  Psychiatric:        Mood and Affect: Mood normal.     Labs reviewed: Basic Metabolic Panel: Recent Labs    09/17/23 1433  12/11/23 1758 03/17/24 1312  NA 137 138 135  K 4.2 3.8 4.1  CL 103 103 103  CO2 28 26 24   GLUCOSE 94 94 158*  BUN 21 19 21   CREATININE 0.76 0.64 0.67  CALCIUM  9.9 9.3 9.5   Liver Function Tests: Recent Labs    09/17/23 1433  AST 21  ALT 15  ALKPHOS 63  BILITOT 0.7  PROT 6.8  ALBUMIN 4.0   No results for input(s): "LIPASE", "AMYLASE" in the last 8760 hours. No results for input(s): "AMMONIA" in the last 8760 hours. CBC: Recent Labs    09/17/23 1433 12/11/23 1758  WBC 5.3 5.6  NEUTROABS 3.6  --   HGB 13.0 13.4  HCT 39.3 39.4  MCV 96.1 94.9  PLT 168 179   Lipid Panel: No results for input(s): "CHOL", "HDL", "LDLCALC", "TRIG", "CHOLHDL", "LDLDIRECT" in the last 8760 hours. TSH: No results for input(s): "TSH" in the last 8760 hours. A1C: Lab Results  Component Value Date   HGBA1C 5.6 04/05/2023     Assessment/Plan Assessment and Plan Assessment & Plan Dizziness and positional vertigo Dizziness and spinning sensation suggestive of positional vertigo and mild orthostatic hypotension. Symptoms slightly improved. - Continue exercises for positional vertigo for five minutes daily. - Advise slow transitions from sitting to standing.  Hypertension Blood pressure elevated at cardiologist visit but well-controlled today. Managed with lisinopril  and metoprolol . - Continue lisinopril  30 mg daily. - Continue metoprolol  25 mg twice daily.  Hx of Pulmonary embolism On warfarin therapy for pulmonary embolism. INR is 2.2. - Continue warfarin 5 mg daily, except 7.5 mg on Monday and Thursday. - Check INR on May 22 with Crystal.  Osteoporosis Receiving Prolia  injections for osteoporosis management. - Continue Prolia  injections as scheduled.  Hx Breast cancer No current breast cancer. Educated about blood pressure measurement considerations post-treatment.  Constipation Constipation managed with Miralax.  . Advised to avoid straining.  Idiopathic peripheral  neuropathy Continues to have work up through neurology, overall symptoms are stable.   Chronic anticoagulation Due to hx of DVT and PE.    Atilla Zollner K. Denney Fisherman  [facility] [phone]

## 2024-04-23 LAB — PROTIME-INR
INR: 2.1 — ABNORMAL HIGH
Prothrombin Time: 21.3 s — ABNORMAL HIGH (ref 9.0–11.5)

## 2024-05-04 NOTE — Procedures (Signed)
 [facility] - Neurology Department [address]  [place], [place] [address] [phone] (Phone);  [phone] (Fax) Test Date:  04/22/2024  Patient: Becky Farrell DOB: 05/23/1934 Physician: Dr. Arthea Farrow  Chart#: [id number] Sex: Female Ref Phys: Dr. Arthea Farrow   Patient History: Patient is a 89 year-old female who presents with bilateral foot numbness and tingling.  Unsteady gait.  Past medical history is significant for cancer (radiation) and bleeding tendency (warfarin).    Exam: Decreased sensation to all modalities in a length dependent gradient in the lower extremities.  EMG & NCV Findings: Evaluation of the Left tibial motor nerve showed decreased conduction velocity (Knee-Ankle, 39 m/s).  The Right tibial motor nerve showed reduced amplitude (1.8 mV).  The Left superficial peroneal sensory and the Right superficial peroneal sensory nerves showed no response (14 cm).  The Left sural sensory and the Right sural sensory nerves showed no response (Calf).  All remaining nerves (as indicated in the following tables) were within normal limits.   EMG   Side Muscle Nerve Root Ins Act Fibs Psw Amp Dur Poly Recrt Int Bruna Comment  Right Gastroc Tibial S1-2 Nml Nml Nml Nml Nml 0 Nml Nml   Right AntTibialis Dp Br Peron L4-5 Nml Nml Nml Nml Nml 0 Nml Nml   Right Peroneus Long Sup Br Peron L5-S1 Nml Nml Nml Nml Nml 0 Nml Nml   Right VastusLat Femoral L2-4 Nml Nml Nml Nml Nml 0 Nml Nml   Right TensorFascLat SupGluteal L4-5, S1 Nml Nml Nml Nml Nml 0 Nml Nml    Impression: Abnormal study.  There is electrodiagnostic evidence of a chronic, severe sensory polyneuropathy in the lower extremities.     Thank you for the referral of this patient. It was our privilege to participate in care of your patient.  Feel free to contact us  with any further questions.   _____________________________ Arthea Farrow, M.D.  Nerve Conduction Studies Anti Sensory Summary Table   Stim  Site NR Peak (ms) Norm Peak (ms) P-T Amp (V) Norm P-T Amp Site1 Site2 Dist (cm) Vel (m/s) Norm Vel (m/s)  Left Sup Peron Anti Sensory (Ant Lat Mall)  14 cm NR  <4.4  >5.0 14 cm Ant Lat Mall 14.0  >32  Right Sup Peron Anti Sensory (Ant Lat Mall)  14 cm NR  <4.4  >5.0 14 cm Ant Lat Mall 14.0  >32  Left Sural Anti Sensory (Lat Mall)  Calf NR  <4.0  >5.0 Calf Lat Mall 14.0  >35  Right Sural Anti Sensory (Lat Mall)  Calf NR  <4.0  >5.0 Calf Lat Mall 14.0  >35   Motor Summary Table   Stim Site NR Onset (ms) Norm Onset (ms) O-P Amp (mV) Norm O-P Amp Site1 Site2 Dist (cm) Vel (m/s) Norm Vel (m/s)  Left Peroneal Motor (Ext Dig Brev)  Ankle    4.8 <6.6 4.9 >2.0 B Fib Ankle 32.0 47 >38  B Fib    11.6  3.3  Poplt B Fib 9.0 53 >40  Poplt    13.3  4.3        Right Peroneal Motor (Ext Dig Brev)  Ankle    4.5 <6.6 4.6 >2.0 B Fib Ankle 31.5 47 >38  B Fib    11.2  3.1  Poplt B Fib 9.0 60 >40  Poplt    12.7  3.1        Left Tibial Motor (Abd Hall Brev)  Ankle    4.0 <6.6  5.6 >2.0 Knee Ankle 37.0 39 >42  Knee    13.4  3.8        Right Tibial Motor (Abd Hall Brev)  Ankle    4.6 <6.6 1.8 >2.0 Knee Ankle 38.0 45 >42  Knee    13.0  1.6          Waveforms:                    I have reviewed, edited and added to the note as needed to reflect my best personal medical judgment.    Dr. Arthea Farrow, MD [facility] [place], [place] Ph:  [phone] Fax:  [phone]

## 2024-05-05 ENCOUNTER — Telehealth: Payer: Self-pay | Admitting: *Deleted

## 2024-05-05 DIAGNOSIS — Z7901 Long term (current) use of anticoagulants: Secondary | ICD-10-CM

## 2024-05-05 NOTE — Telephone Encounter (Signed)
 Copied from CRM [id number]. Topic: Clinical - Lab/Test Results >> May 05, 2024  4:24 PM Blair Bumpers wrote: Reason for CRM: Patient is calling in wanting her INR lab results. Advised patient that they have not been reviewed by provider at this time but once reviewed, someone will give her a call to provide the results to her.   Forwarded message to Ulyses Gandy, NP due to Camilo Cella out of office.

## 2024-05-05 NOTE — Telephone Encounter (Signed)
 INR is 2.1 which is at goal. No changes to medication at this time.

## 2024-05-06 NOTE — Addendum Note (Signed)
 Addended by: Rexie Catena A on: 05/06/2024 10:03 AM   Modules accepted: Orders

## 2024-05-06 NOTE — Telephone Encounter (Signed)
 Patient notified and agreed.  Scheduled repeat INR for 06/08/24. Order placed and printed and placed in [facility].

## 2024-05-11 ENCOUNTER — Other Ambulatory Visit: Payer: Self-pay | Admitting: Nurse Practitioner

## 2024-05-18 ENCOUNTER — Ambulatory Visit (INDEPENDENT_AMBULATORY_CARE_PROVIDER_SITE_OTHER): Admitting: Podiatry

## 2024-05-18 DIAGNOSIS — Z91199 Patient's noncompliance with other medical treatment and regimen due to unspecified reason: Secondary | ICD-10-CM

## 2024-05-21 ENCOUNTER — Ambulatory Visit: Payer: Medicare Other | Admitting: Nurse Practitioner

## 2024-05-21 ENCOUNTER — Encounter: Payer: Self-pay | Admitting: Nurse Practitioner

## 2024-05-21 VITALS — BP 128/76 | HR 79 | Temp 98.3°F | Resp 17 | Ht 62.0 in | Wt 128.0 lb

## 2024-05-21 DIAGNOSIS — Z0000 Encounter for general adult medical examination without abnormal findings: Secondary | ICD-10-CM | POA: Diagnosis not present

## 2024-05-21 NOTE — Patient Instructions (Addendum)
  Becky Farrell , Thank you for taking time to come for your Medicare Wellness Visit. I appreciate your ongoing commitment to your health goals. Please review the following plan we discussed and let me know if I can assist you in the future.   To get your Prevnar 20 vaccine - pneumonia vaccine at your local pharmacy To get shingles vaccines at local pharmacy    This is a list of the screening recommended for you and due dates:  Health Maintenance  Topic Date Due   Zoster (Shingles) Vaccine (1 of 2) 08/13/1953   Pneumococcal Vaccine for age over 52 (3 of 3 - PCV20 or PCV21) 12/05/2021   Flu Shot  07/03/2024   Mammogram  08/21/2024   DEXA scan (bone density measurement)  08/30/2024   Medicare Annual Wellness Visit  05/21/2025   DTaP/Tdap/Td vaccine (3 - Td or Tdap) 05/09/2031   HPV Vaccine  Aged Out   Meningitis B Vaccine  Aged Out   COVID-19 Vaccine  Discontinued

## 2024-05-21 NOTE — Progress Notes (Signed)
 Subjective:   Becky Farrell is a 89 y.o. female who presents for Medicare Annual (Subsequent) preventive examination.  Visit Complete: In person at [facility]   Cardiac Risk Factors include: advanced age (>58men, >37 women);hypertension;sedentary lifestyle     Objective:    Today's Vitals   05/21/24 1035  BP: 128/76  Pulse: 79  Resp: 17  Temp: 98.3 F (36.8 C)  SpO2: 96%  Weight: 128 lb (58.1 kg)  Height: 5' 2 (1.575 m)   Body mass index is 23.41 kg/m.     05/21/2024   10:20 AM 04/14/2024    9:59 AM 12/19/2023   10:22 AM 12/11/2023    5:42 PM 10/15/2023    9:26 AM 05/30/2023    9:45 AM 05/20/2023    2:59 PM  Advanced Directives  Does Patient Have a Medical Advance Directive? Yes Yes Yes Yes Yes Yes Yes  Type of Advance Directive Living will;Healthcare Power of Attorney Living will;Healthcare Power of [employer] Power of [place];Living will  Healthcare Power of [place];Living will Healthcare Power of [place];Living will Healthcare Power of [address];Living will  Does patient want to make changes to medical advance directive? No - Patient declined No - Patient declined No - Patient declined  No - Patient declined No - Patient declined No - Patient declined  Copy of Healthcare Power of Attorney in Chart? Yes - validated most recent copy scanned in chart (See row information) Yes - validated most recent copy scanned in chart (See row information) Yes - validated most recent copy scanned in chart (See row information)  Yes - validated most recent copy scanned in chart (See row information) Yes - validated most recent copy scanned in chart (See row information) Yes - validated most recent copy scanned in chart (See row information)    Current Medications (verified) Outpatient Encounter Medications as of 05/21/2024  Medication Sig   aspirin  81 MG tablet Take 81 mg by mouth daily.   Cholecalciferol (VITAMIN D3) 2000 UNITS TABS Take 2,000 Units by mouth daily.     denosumab  (PROLIA ) 60 MG/ML SOSY injection Inject 60 mg into the skin every 6 (six) months.   lisinopril  (ZESTRIL ) 30 MG tablet TAKE 1 TABLET BY MOUTH EVERY DAY   metoprolol  tartrate (LOPRESSOR ) 25 MG tablet TAKE 1 TABLET BY MOUTH TWICE A DAY   metroNIDAZOLE  (METROCREAM ) 0.75 % cream Apply to nose every night for rosacea.   polyethylene glycol (MIRALAX / GLYCOLAX) packet Take 17 g by mouth every other day.   vitamin B-12 (CYANOCOBALAMIN ) 1000 MCG tablet Take 1,000 mcg by mouth daily.   warfarin (COUMADIN ) 5 MG tablet Take 5mg  daily except Monday and Thursday take 7.5mg .   Wheat Dextrin (BENEFIBER PO) Take 2 each by mouth every other day.   ketoconazole  (NIZORAL ) 2 % cream Apply between toes and around toenails every night. (Patient not taking: Reported on 05/21/2024)   No facility-administered encounter medications on file as of 05/21/2024.    Allergies (verified) Clobetasol, Clobetasol propionate, Phenergan [promethazine hcl], Dextromethorphan, Promethazine, and Zithromax [azithromycin]   History: Past Medical History:  Diagnosis Date   Colon polyps    Family history of breast cancer    History of pulmonary embolism 2011   Hx of melanoma of skin 2003   Right leg   Hypertension    Melanoma (HCC) 2003   right lower leg. Treated in [place].   Personal history of radiation therapy    Seizures (HCC) 1993   no meds since (approx) 2014  Vertigo    x1. R/T perforated ear drum prior to 2013   Past Surgical History:  Procedure Laterality Date   ABDOMINAL HYSTERECTOMY  05/2014   BREAST BIOPSY Right 08/20/2018   invasive mammary carcinoma/DCIS    BREAST LUMPECTOMY WITH RADIOACTIVE SEED LOCALIZATION Right 10/10/2018   Procedure: BREAST LUMPECTOMY WITH RADIOACTIVE SEED LOCALIZATION;  Surgeon: Caralyn Chandler, MD;  Location: [facility];  Service: General;  Laterality: Right;   CATARACT EXTRACTION W/PHACO Right 01/06/2018   Procedure: CATARACT EXTRACTION PHACO AND INTRAOCULAR LENS PLACEMENT (IOC)  RIGHT;  Surgeon: Annell Kidney, MD;  Location: [facility];  Service: Ophthalmology;  Laterality: Right;   CATARACT EXTRACTION W/PHACO Left 01/29/2018   Procedure: CATARACT EXTRACTION PHACO AND INTRAOCULAR LENS PLACEMENT (IOC) LEFT;  Surgeon: Annell Kidney, MD;  Location: [facility];  Service: Ophthalmology;  Laterality: Left;   left hand surgery  05/2011   s/p fall   OTHER SURGICAL HISTORY  2003   Removal of lymphoma from melonoma 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    Family History  Problem Relation Age of Onset   Heart disease Mother    Aortic aneurysm Father    Breast cancer Daughter 37   Breast cancer Niece 51   Social History   Socioeconomic History   Marital status: Married    Spouse name: Not on file   Number of children: Not on file   Years of education: Not on file   Highest education level: Not on file  Occupational History   Not on file  Tobacco Use   Smoking status: Never   Smokeless tobacco: Never  Vaping Use   Vaping status: Never Used  Substance and Sexual Activity   Alcohol use: No   Drug use: Never   Sexual activity: Never  Other Topics Concern   Not on file  Social History Narrative   Recently moved from [place] to [facility].   Daughter and grandchildren live in [place].   Has other children in [place] .   Retired Runner, broadcasting/film/video.      Husband has been recovering from Prostate CA.   Social Drivers of Corporate investment banker Strain: Not on file  Food Insecurity: No Food Insecurity (05/21/2024)   Hunger Vital Sign    Worried About Running Out of Food in the Last Year: Never true    Ran Out of Food in the Last Year: Never true  Transportation Needs: No Transportation Needs (05/21/2024)   PRAPARE - Administrator, Civil Service (Medical): No    Lack of Transportation (Non-Medical): No  Physical Activity: Not on file  Stress: Not on file  Social Connections: Not on file    Tobacco Counseling Counseling given: Not  Answered   Clinical Intake:  Pre-visit preparation completed: Yes  Pain : No/denies pain     BMI - recorded: 23 Nutritional Status: BMI of 19-24  Normal Diabetes: No  How often do you need to have someone help you when you read instructions, pamphlets, or other written materials from your doctor or pharmacy?: 1 - Never         Activities of Daily Living    05/21/2024   10:27 AM  In your present state of health, do you have any difficulty performing the following activities:  Hearing? 1  Vision? 0  Difficulty concentrating or making decisions? 0  Walking or climbing stairs? 1  Dressing or bathing? 0  Doing errands, shopping? 0  Preparing Food and eating ? N  Using the  Toilet? N  In the past six months, have you accidently leaked urine? N  Do you have problems with loss of bowel control? Y  Managing your Medications? N  Managing your Finances? N  Housekeeping or managing your Housekeeping? N    Patient Care Team: Verma Gobble, NP as PCP - General (Geriatric Medicine) Wenona Hamilton, MD as PCP - Cardiology (Cardiology) Glenis Langdon, MD as Radiation Oncologist (Radiation Oncology) Timmy Forbes, MD as Consulting Physician (Oncology) Annell Kidney, MD as Referring Physician (Ophthalmology) Durward Gillis, PA-C as Physician Assistant (Cardiology)  Indicate any recent Medical Services you may have received from other than [facility] providers in the past year (date may be approximate).     Assessment:   This is a routine wellness examination for Yi.  Hearing/Vision screen Hearing Screening - Comments:: Some hearing issues. Vision Screening - Comments:: Eye exam sept.2024 Dr.bennett    Goals Addressed   None    Depression Screen    05/21/2024   10:25 AM 04/14/2024    9:59 AM 12/19/2023   10:23 AM 05/30/2023    9:46 AM 05/20/2023    2:59 PM 05/16/2023    8:42 AM 04/11/2023    9:32 AM  PHQ 2/9 Scores  PHQ - 2 Score 0 0 0 0 0 0 0    Fall Risk     05/21/2024   10:25 AM 04/14/2024    9:59 AM 12/19/2023   10:23 AM 05/30/2023    9:46 AM 05/20/2023    2:59 PM  Fall Risk   Falls in the past year? 0 0 0 0 0  Number falls in past yr: 0 0 0 0 0  Injury with Fall? 0 0 0 0 0  Risk for fall due to : Impaired balance/gait Impaired balance/gait     Follow up Falls evaluation completed Falls evaluation completed       MEDICARE RISK AT HOME: Medicare Risk at Home Any stairs in or around the home?: Yes If so, are there any without handrails?: No Home free of loose throw rugs in walkways, pet beds, electrical cords, etc?: Yes Adequate lighting in your home to reduce risk of falls?: Yes Life alert?: Yes Use of a cane, walker or w/c?: No Grab bars in the bathroom?: Yes Shower chair or bench in shower?: Yes Elevated toilet seat or a handicapped toilet?: Yes  TIMED UP AND GO:  Was the test performed?  No    Cognitive Function:    05/16/2023   10:35 AM 11/29/2016    2:15 PM  MMSE - Mini Mental State Exam  Orientation to time 5 5   Orientation to Place 5 5   Registration 3 3   Attention/ Calculation 5 0   Recall 3 3   Language- name 2 objects 2 0   Language- repeat 1 1  Language- follow 3 step command 3 3   Language- read & follow direction 1 0   Write a sentence 1 0   Copy design 1 0   Total score 30 20      Data saved with a previous flowsheet row definition        05/21/2024   10:25 AM  6CIT Screen  What Year? 0 points  What month? 0 points  What time? 0 points  Count back from 20 0 points  Months in reverse 0 points  Repeat phrase 0 points  Total Score 0 points    Immunizations Immunization History  Administered Date(s) Administered  Fluad Quad(high Dose 65+) 08/05/2019, 08/24/2020   Influenza Split 11/02/1999, 12/17/2000, 10/29/2001, 09/14/2002, 08/20/2012   Influenza, High Dose Seasonal PF 09/21/2018, 09/24/2022   Influenza,inj,Quad PF,6+ Mos 08/06/2014, 08/26/2017   Influenza-Unspecified 08/23/2013,  09/02/2016, 09/28/2021, 09/26/2023   Moderna Sars-Covid-2 Vaccination 12/17/2019, 01/14/2020, 10/18/2020   Pneumococcal Conjugate-13 12/05/2016   Pneumococcal Polysaccharide-23 12/17/2000   Tdap 05/18/2011, 05/08/2021   Zoster, Live 09/05/2012    TDAP status: Up to date  Flu Vaccine status: Up to date  Pneumococcal vaccine status: Due, Education has been provided regarding the importance of this vaccine. Advised may receive this vaccine at local pharmacy or Health Dept. Aware to provide a copy of the vaccination record if obtained from local pharmacy or Health Dept. Verbalized acceptance and understanding.  Covid-19 vaccine status: Declined, Education has been provided regarding the importance of this vaccine but patient still declined. Advised may receive this vaccine at local pharmacy or Health Dept.or vaccine clinic. Aware to provide a copy of the vaccination record if obtained from local pharmacy or Health Dept. Verbalized acceptance and understanding.  Qualifies for Shingles Vaccine? Yes   Zostavax completed No   Shingrix Completed?: No.    Education has been provided regarding the importance of this vaccine. Patient has been advised to call insurance company to determine out of pocket expense if they have not yet received this vaccine. Advised may also receive vaccine at local pharmacy or Health Dept. Verbalized acceptance and understanding.  Screening Tests Health Maintenance  Topic Date Due   Zoster Vaccines- Shingrix (1 of 2) 08/13/1953   Pneumococcal Vaccine: 50+ Years (3 of 3 - PCV20 or PCV21) 12/05/2021   INFLUENZA VACCINE  07/03/2024   MAMMOGRAM  08/21/2024   DEXA SCAN  08/30/2024   Medicare Annual Wellness (AWV)  05/21/2025   DTaP/Tdap/Td (3 - Td or Tdap) 05/09/2031   HPV VACCINES  Aged Out   Meningococcal B Vaccine  Aged Out   COVID-19 Vaccine  Discontinued    Health Maintenance  Health Maintenance Due  Topic Date Due   Zoster Vaccines- Shingrix (1 of 2)  08/13/1953   Pneumococcal Vaccine: 50+ Years (3 of 3 - PCV20 or PCV21) 12/05/2021    Colorectal cancer screening: No longer required.   Mammogram status: No longer required due to age.  Bone Density status: Completed 2023. Results reflect: Bone density results: OSTEOPOROSIS. Repeat every 2 years.  Lung Cancer Screening: (Low Dose CT Chest recommended if Age 61-80 years, 20 pack-year currently smoking OR have quit w/in 15years.) does not qualify.   Lung Cancer Screening Referral: na  Additional Screening:  Hepatitis C Screening: does not qualify; Completed   Vision Screening: Recommended annual ophthalmology exams for early detection of glaucoma and other disorders of the eye. Is the patient up to date with their annual eye exam?  Yes  Who is the provider or what is the name of the office in which the patient attends annual eye exams? [employer] If pt is not established with a provider, would they like to be referred to a provider to establish care? No .   Dental Screening: Recommended annual dental exams for proper oral hygiene  Community Resource Referral / Chronic Care Management: CRR required this visit?  No   CCM required this visit?  No     Plan:     I have personally reviewed and noted the following in the patient's chart:   Medical and social history Use of alcohol, tobacco or illicit drugs  Current medications and supplements including  opioid prescriptions. Patient is not currently taking opioid prescriptions. Functional ability and status Nutritional status Physical activity Advanced directives List of other physicians Hospitalizations, surgeries, and ER visits in previous 12 months Vitals Screenings to include cognitive, depression, and falls Referrals and appointments  In addition, I have reviewed and discussed with patient certain preventive protocols, quality metrics, and best practice recommendations. A written personalized care plan for preventive  services as well as general preventive health recommendations were provided to patient.     Verma Gobble, NP   05/21/2024

## 2024-05-21 NOTE — Progress Notes (Signed)
 1. No-show for appointment

## 2024-05-27 NOTE — Progress Notes (Signed)
 Today the history is gathered from: 100% - patient  0% - alone in the office  RECORDS SUMMARY: Patient is here for a follow up for an ER visit on 12/11/2023 for dizziness.   REFERRING PHYSICIAN: Pcp PRIMARY CARE PHYSICIAN:  Becky Harlene Carwin, NP   IMPRESSION/PLAN  Becky Farrell is a 89 y.o. female presenting for evaluation of  DIZZINESS/ IMBALANCE/ HEARING DIFFICULTY/  - Ongoing. - Patient with ongoing dizzy spells, tingling, numbness, pain, and cramping in the toes of the left foot and in bilateral fingers. Sleep and mood are well.  - Referral to PT at [facility] for gait and balance training.  - Reviewed results of EMG from 04/22/2024 in the office today. Results indicated chronic, severe sensory polyneuropathy in the lower extremities.  - Recommend controlling vascular risk factors such as cholesterol, blood pressure, poor diet, or alcohol consumption to prevent worsening of polyneuropathy.  - Discussed diagnosis for positional vertigo is primarily based upon clinical symptoms. Patient does not need to fax results from ENT office at this time.  - Encouraged patient to stay physically active and exercise on regular basis (90-120  mins per week or 15-30 mins per day). Exercise, particularly walking, can be very beneficial in many neurological conditions.  - Could consider referral to vestibular therapy at future visit if worsening vertigo.   Medications previously tried:  Follow-up with Dr. Lane in 6 months.  p=4  CHIEF COMPLAINT & HPI  Ms. Becky Farrell is a 89 y.o. female presenting for evaluation of: Chief Complaint  Patient presents with  . DIZZINESS/ IMBALANCE/ HEARING DIFFICULTY/     DIZZINESS/ IMBALANCE/ HEARING DIFFICULTY/  Patient with improving dizzy spells and lightheadedness with positional changes. Reports she spoke with ENT who instructed her to discuss with our office about requesting records of recent vertigo testing. Infrequent completion of Semont maneuver at  home which slightly helped to improve symptoms. Ongoing tingling, numbness, pain, and cramping in the toes of the left foot and in bilateral fingers. Notes slight tremor and grip strength difficulty in bilateral hands. Unchanged imbalance. Continues to ambulate carefully and slowly to avoid falling. Graduated from PT for gait and balance. Sleep and mood are well.   DATA SUMMARY: 04/22/2024 EMG LOWERS IMPRESSION: Abnormal study.  There is electrodiagnostic evidence of a chronic, severe sensory polyneuropathy in the lower extremities.   12/12/2023 MR BRAIN WO CONTRAST IMPRESSION:  1. No acute intracranial abnormality.  2. 7 mm nodular intraventricular lesion/mass straddling the septum  pellucidum. Finding is nonspecific, and could reflect a small  subependymoma or possibly meningioma. This is relatively stable in  retrospect.  3. Underlying age-related cerebral atrophy with moderate chronic  microvascular ischemic disease.   12/11/2023 CT HEAD WO CONTRAST IMPRESSION:  Atrophy, chronic microvascular disease.  No acute intracranial abnormality.   01/01/2023 CT HEAD WO CONTRAST IMPRESSION:  No evidence of acute intracranial abnormality.   07/02/2022 MR BRAIN WO CONTRAST IMPRESSION:  1. No acute intracranial abnormality.  2. Mild-to-moderate chronic microvascular ischemic disease.   07/02/2022 CT ANGIO HEAD NECK W WO CM IMPRESSION:  1. Negative CTA for large vessel occlusion or other emergent  finding.  2. Mild atheromatous change about the carotid bifurcations and  carotid siphons without hemodynamically significant stenosis.  3. Diffuse tortuosity of the major arterial vasculature of the head  and neck, suggesting chronic underlying hypertension.  4. Mild interlobular septal thickening within the visualized lungs,  suggesting mild pulmonary interstitial congestion/edema.  07/01/2022 CT HEAD WO CONTRAST IMPRESSION:  Chronic atrophic and  ischemic changes without acute  abnormality.   07/06/2017 MR BRAIN WO CONTRAST IMPRESSION:  1. No acute intracranial abnormality, and largely unremarkable for  age noncontrast MRI appearance of the brain.  2.  Negative intracranial MRA.   07/06/2017 CT HEAD WO CONTRAST IMPRESSION:  No acute intracranial abnormality.  Mild cerebral atrophy.   05/19/2011 MRI BRAIN W WO IMPRESSION:  1. No evidence of acute intracranial abnormality.  No pathologic enhancement seen.  2. Chronic small vessel ischemic changes  I have personally reviewed this examination and agree with the  resident/fellow physician's interpretation.   04/22/2013 ROUTINE EEG IMPRESSION: This routine EEG in the awake and asleep states is within normal limits.   VISIT SUMMARIES:   MEDICATIONS Current Outpatient Medications  Medication Sig Dispense Refill  . aspirin  81 MG EC tablet Take 81 mg by mouth once daily.    . BENEFIBER, WHEAT DEXTRIN, ORAL Take by mouth every other day Alternating with Miralax    . cholecalciferol (VITAMIN D3) 1,000 unit tablet Take by mouth.    . cyanocobalamin  (VITAMIN B12) 1000 MCG tablet Take 1,000 mcg by mouth once daily    . denosumab  (PROLIA ) 60 mg/mL inj syringe Inject subcutaneously every 6 (six) months.    . ketoconazole  (NIZORAL ) 2 % cream Apply topically once daily    . lisinopriL  (ZESTRIL ) 30 MG tablet Take 30 mg by mouth once daily    . metoprolol  TARTrate (LOPRESSOR ) 25 MG tablet Take 25 mg by mouth 2 (two) times daily    . metroNIDAZOLE  (METROCREAM ) 0.75 % cream Apply topically 2 (two) times daily    . polyethylene glycol (MIRALAX) packet Take 17 g by mouth every other day Alternate Miralax with Benefiber every other day    . warfarin (COUMADIN ) 5 MG tablet Take 5 mg by mouth once daily Take 5mg  daily except Monday and Thursday take 7.5mg .    . atenolol  (TENORMIN ) 25 MG tablet Take 25 mg by mouth once daily. (Patient not taking: Reported on 05/27/2024)    . hydrochlorothiazide  (MICROZIDE ) 12.5 mg capsule Take  12.5 mg by mouth once daily. (Patient not taking: Reported on 05/27/2024)    . lisinopril  (PRINIVIL ,ZESTRIL ) 10 MG tablet Take 10 mg by mouth once daily. (Patient not taking: Reported on 05/27/2024)    . warfarin (COUMADIN ) 7.5 MG tablet Take 7.5 mg by mouth once daily. (Patient not taking: Reported on 05/27/2024)     No current facility-administered medications for this visit.    ALLERGIES Allergies  Allergen Reactions  . Clobetasol Dizziness  . Dextromethorphan Palpitations  . Phenergan [Promethazine] Palpitations  . Zithromax [Azithromycin] Palpitations     EXAM   Vitals:   05/27/24 1539  Weight: 59 kg (130 lb)  Height: 165.1 cm (5' 5)  PainSc: 0-No pain    Body mass index is 21.63 kg/m.  GENERAL: Pleasant female, NAD.  Normocephalic and atraumatic.  EYES:  MUSCULOSKELETAL: Bulk - Normal Tone - Normal Pronator Drift - Absent bilaterally. Ambulation - Gait and station are steady. Romberg - mildy positive      R/L 5/5    Shoulder abduction (deltoid/supraspinatus, axillary/suprascapular n, C5) 5/5    Elbow flexion (biceps brachii, musculoskeletal n, C5-6) 5/5    Elbow extension (triceps, radial n, C7) 5/5    Finger adduction (interossei, ulnar n, T1)  5/5    Hip flexion (iliopsoas, L1/L2) 5/5    Knee flexion (hamstrings, sciatic n, L5/S1)  5/5    Knee extension (quadriceps, femoral n, L3/4) 5/5    Ankle  dorsiflexion (tibialis anterior, deep fibular n, L4/5) 5/5    Ankle plantarflexion (gastroc, tibial n, S1)   NEUROLOGICAL: MENTAL STATUS: Patient is oriented to person, place and time.  Recent memory is mildly reduced.  Remote memory is intact.  Attention span and concentration are intact.  Naming, repetition, comprehension and expressive speech are within normal limits.  Patient's fund of knowledge is within normal limits for educational level.  CRANIAL NERVES: Normal    CN II (normal visual acuity and visual fields) Normal    CN III, IV, VI (extraocular  muscles are intact) Normal    CN V (facial sensation is intact bilaterally) Normal    CN VII (facial strength is intact bilaterally) Normal    CN VIII (hearing is intact bilaterally) Normal    CN IX/X (palate elevates midline, normal phonation) Normal    CN XI (shoulder shrug strength is normal and symmetric) Normal    CN XII (tongue protrudes midline)  SENSATION: Intact to pain and temp bilaterally (spinothalamic tracts) Intact to position and vibration bilaterally (dorsal columns)  REFLEXES: R/L 2+/2+    Biceps 2+/2+    Brachioradialis  2+/2+    Patellar 2+/2+    Achilles  COORDINATION/CEREBELLAR: Finger to nose testing is within normal limits.     PAST MEDICAL HISTORY Past Medical History:  Diagnosis Date  . Colon polyps   . Family history of breast cancer   . History of pulmonary embolism 2011  . Hypertension   . Melanoma (CMS/HHS-HCC) 2003   Right leg  . Personal history of radiation therapy   . Seizure (CMS/HHS-HCC) 1993  . Vertigo    x1. R/T perforated ear drum prior to 2013    PAST SURGICAL HISTORY Past Surgical History:  Procedure Laterality Date  . 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  Right 2003  . left hand surgery  Left 05/2011  . ABDOMINAL HYSTERECTOMY  05/2014  . CATARACT EXTRACTION W/PHACO Right 01/06/2018  . CATARACT EXTRACTION W/PHACO Left 01/29/2018  . BREAST EXCISIONAL BIOPSY Right 08/20/2018  . BREAST LUMPECTOMY WITH RADIOACTIVE SEED LOCALIZATION Right 10/10/2018    FAMILY HISTORY Family History  Problem Relation Name Age of Onset  . Heart failure Mother    . Heart disease Mother    . Aortic aneurysm Father    . Breast cancer Daughter      SOCIAL HISTORY  Social History   Tobacco Use  . Smoking status: Never  . Smokeless tobacco: Never  Vaping Use  . Vaping status: Never Used  Substance Use Topics  . Alcohol use: No    Alcohol/week: 0.0 standard drinks of alcohol  . Drug use: No    REVIEW OF SYSTEMS:  13  system ROS form was given to the patient to complete and I have reviewed it.  The form was sent for scan to the patient's EHR.  Pertinent positives and negatives are mentioned above in the HPI and all other systems are negative.   DATA  I have personally reviewed all of the data outlined below both prior to the appointment and during the appointment with the patient as appropriate.  No visits with results within 6 Month(s) from this visit.  Latest known visit with results is:  No results found for any previous visit.    No follow-ups on file.  Payor: BCBS MEDICARE ADVANTAGE PLAN / Plan: BCBS MEDICARE ADVANTAGE OOS / Product Type: Medicare /   This note is partially written by [employer], in the presence  of and acting as the scribe of Dr. Arthea Farrow.   I have reviewed, edited and added to the note as needed to reflect my best personal medical judgment.    Dr. Arthea Farrow, MD [facility] [place], [place] Ph:  [phone] Fax:  [phone]

## 2024-06-03 ENCOUNTER — Encounter: Payer: Self-pay | Admitting: Medical

## 2024-06-03 ENCOUNTER — Ambulatory Visit: Attending: Medical | Admitting: Medical

## 2024-06-03 VITALS — BP 120/78 | HR 67 | Ht 63.0 in | Wt 127.2 lb

## 2024-06-03 DIAGNOSIS — I471 Supraventricular tachycardia, unspecified: Secondary | ICD-10-CM | POA: Diagnosis not present

## 2024-06-03 DIAGNOSIS — I493 Ventricular premature depolarization: Secondary | ICD-10-CM | POA: Diagnosis not present

## 2024-06-03 DIAGNOSIS — Z7901 Long term (current) use of anticoagulants: Secondary | ICD-10-CM

## 2024-06-03 DIAGNOSIS — I340 Nonrheumatic mitral (valve) insufficiency: Secondary | ICD-10-CM | POA: Diagnosis not present

## 2024-06-03 DIAGNOSIS — I10 Essential (primary) hypertension: Secondary | ICD-10-CM

## 2024-06-03 MED ORDER — LISINOPRIL 20 MG PO TABS: 20.0000 mg | ORAL_TABLET | Freq: Every day | ORAL | 2 refills | Status: DC

## 2024-06-03 NOTE — Progress Notes (Signed)
 Cardiology Office Note   Date:  06/03/2024  ID:  Becky Farrell, DOB 07-Feb-1934, MRN [id number] PCP: Caro Harlene POUR, NP  [facility] Providers Cardiologist:  Deatrice Cage, MD Cardiology APP:  [place] Mikey DEL, PA-C   History of Present Illness Becky Farrell is a 89 y.o. female with a hx of PVCs, SVT, palpitations, chronic dizziness, PE on warfarin, osteoporosis, hypertension, right breast cancer status postlumpectomy and radiation, mild aortic stenosis presents for follow-up.  Her and her husband reside at [facility].   Seen by Dr. Cage 02/01/2021 reported mild exertional dyspnea and PVCs.  Murmur was noted on exam.  ZIO and echo were recommended.  Her ZIO showed dominantly normal sinus rhythm with occasional PACs with a 2.7 burden, PVCs with a 2.7 burden, 35 runs of SVT fastest 5 beats with 187 bpm and longest 16.5 seconds with a rate of 110 bpm.  Echo showed LVEF 50 to 55%, no wall motion abnormality, mildly elevated PASP, bilateral atrial mildly dilated, mild MR, mild AS.   Seen 03/10/2021 and overall feeling well with occasional palpitations.  Atenolol  was transitioned to metoprolol    ABI's in 2022 showed no significant disease bilaterally.   The patient was last seen 02/27/24 and BP was mildly elevated.  Saw Dr. Lane (neurology) at [facility] for dizziness and he reported severe sensory polyneuropathy in the lower extremities. He recommended physical activity. Can consider vestibular therapy.  Today, BP is good today. She reports that  a few weeks ago the car door hit her inner right lower leg and she has a bruise. She reports that dizziness is better. She is doing PT at [facility]. She denies chest pain or SOB. She has occasional palpitations.   Studies Reviewed EKG Interpretation Date/Time:  Wednesday June 03 2024 14:14:41 EDT Ventricular Rate:  67 PR Interval:  160 QRS Duration:  92 QT Interval:  402 QTC Calculation: 424 R Axis:   -26  Text Interpretation: Sinus  rhythm with occasional Premature ventricular complexes Moderate voltage criteria for LVH, may be normal variant ( R in aVL , Cornell product ) When compared with ECG of 27-Feb-2024 14:12, Premature ventricular complexes are now Present Confirmed by Franchester, Klever Twyford ([id number]) on 06/03/2024 2:22:13 PM    Echo 03/2024 1. Left ventricular ejection fraction, by estimation, is 55 to 60%. The  left ventricle has normal function. The left ventricle has no regional  wall motion abnormalities. Left ventricular diastolic parameters are  consistent with Grade I diastolic  dysfunction (impaired relaxation). The average left ventricular global  longitudinal strain is -14.3 %. The global longitudinal strain is  abnormal.   2. Right ventricular systolic function is normal. The right ventricular  size is normal. There is mildly elevated pulmonary artery systolic  pressure. The estimated right ventricular systolic pressure is 36.4 mmHg.   3. Left atrial size was moderately dilated.   4. The mitral valve is normal in structure. Mild mitral valve  regurgitation. No evidence of mitral stenosis.   5. Tricuspid valve regurgitation is moderate.   6. The aortic valve is normal in structure. There is mild calcification  of the aortic valve. Aortic valve regurgitation is mild to moderate. Mild  aortic valve stenosis. Aortic valve mean gradient measures 16.0 mmHg.   7. There is borderline dilatation of the ascending aorta, measuring 38  mm.   8. The inferior vena cava is normal in size with greater than 50%  respiratory variability, suggesting right atrial pressure of 3 mmHg.  Echo 01/2021  1. Left ventricular ejection fraction, by estimation, is 50 to 55%. The  left ventricle has low normal function. The left ventricle has no regional  wall motion abnormalities. Left ventricular diastolic parameters are  indeterminate.   2. Right ventricular systolic function is low normal. The right  ventricular size is normal. There  is mildly elevated pulmonary artery  systolic pressure.   3. Left atrial size was mildly dilated.   4. Right atrial size was mildly dilated.   5. The mitral valve is degenerative. Mild mitral valve regurgitation.   6. The aortic valve is tricuspid. Aortic valve regurgitation is mild.  Mild aortic valve sclerosis is present, with no evidence of aortic valve  stenosis.   7. The inferior vena cava is normal in size with greater than 50%  respiratory variability, suggesting right atrial pressure of 3 mmHg.    Heart monitor 01/2021 Patient had a min HR of 44 bpm, max HR of 187 bpm, and avg HR of 69 bpm.  Predominant underlying rhythm was Sinus Rhythm.  35 Supraventricular Tachycardia runs occurred, the run with the fastest interval lasting 5 beats with a max rate of 187 bpm, the longest lasting 16.5 secs with an avg rate of 110 bpm. Supraventricular Tachycardia was detected within +/- 45 seconds of symptomatic patient event(s). Occasional PACs with a burden of 2.7% and occasional PVCs with a burden of 2.1%.     ABIs 07/2021 Summary:  Right: Resting right ankle-brachial index is within normal range. No  evidence of significant right lower extremity arterial disease. The right  toe-brachial index is normal.   Left: Resting left ankle-brachial index is within normal range. No  evidence of significant left lower extremity arterial disease. The left  toe-brachial index is normal.        Physical Exam VS:  BP 120/78 (BP Location: Left Arm, Patient Position: Sitting, Cuff Size: Normal)   Pulse 67   Ht 5' 3 (1.6 m)   Wt 127 lb 4 oz (57.7 kg)   SpO2 98%   BMI 22.54 kg/m        Wt Readings from Last 3 Encounters:  06/03/24 127 lb 4 oz (57.7 kg)  05/21/24 128 lb (58.1 kg)  04/14/24 130 lb (59 kg)    GEN: Well nourished, well developed in no acute distress NECK: No JVD; No carotid bruits CARDIAC: RRR, + murmur, no rubs, gallops RESPIRATORY:  Clear to auscultation without rales, wheezing  or rhonchi  ABDOMEN: Soft, non-tender, non-distended EXTREMITIES:  No edema; No deformity   ASSESSMENT AND PLAN  HTN She still has chronic dizziness, but this has improved. She is following with neurology at [facility]. She would like to try a lower dose of lisinopril  to see if dizziness improves. I will decrease lisinopril  to 20mg  daily and monitor dizziness symptoms. Continue metoprolol  25mg  BID.   Chronic anticoagulation The patient is on warfarin for h/o PE.   Mild AS/MR Recent echo showed LVEF 55-60%, mild MR, mild to mod AI, mild AS  PVC/PAVc/pSVT She reports occasional palpitations. Continue metoprolol  25mg  BID.         Dispo: Follow-up in 6 months  Signed, Janalee Grobe VEAR Fishman, PA-C

## 2024-06-03 NOTE — Patient Instructions (Signed)
 Medication Instructions:    DECREASE: Lisinopril  (Zestril ) from 30mg  to 20 mg daily  Take all other medication as directed   *If you need a refill on your cardiac medications before your next appointment, please call your pharmacy*  Lab Work:  No labs ordered today   If you have labs (blood work) drawn today and your tests are completely normal, you will receive your results only by: MyChart Message (if you have MyChart) OR A paper copy in the mail If you have any lab test that is abnormal or we need to change your treatment, we will call you to review the results.  Testing/Procedures:  No test ordered today   Follow-Up: At [facility], you and your health needs are our priority.  As part of our continuing mission to provide you with exceptional heart care, our providers are all part of one team.  This team includes your primary Cardiologist (physician) and Advanced Practice Providers or APPs (Physician Assistants and Nurse Practitioners) who all work together to provide you with the care you need, when you need it.  Your next appointment:    6 month(s)  Provider:    You may see Deatrice Cage, MD or one of the following Advanced Practice Providers on your designated Care Team:    Cadence [place], PA-C

## 2024-06-08 LAB — PROTIME-INR
INR: 2.3 — ABNORMAL HIGH
Prothrombin Time: 23.1 s — ABNORMAL HIGH (ref 9.0–11.5)

## 2024-06-09 ENCOUNTER — Ambulatory Visit: Payer: Self-pay | Admitting: Nurse Practitioner

## 2024-06-09 DIAGNOSIS — Z7901 Long term (current) use of anticoagulants: Secondary | ICD-10-CM

## 2024-06-12 ENCOUNTER — Encounter: Payer: Self-pay | Admitting: Podiatry

## 2024-06-12 ENCOUNTER — Ambulatory Visit (INDEPENDENT_AMBULATORY_CARE_PROVIDER_SITE_OTHER): Admitting: Podiatry

## 2024-06-12 VITALS — Ht 63.0 in | Wt 127.2 lb

## 2024-06-12 DIAGNOSIS — B351 Tinea unguium: Secondary | ICD-10-CM

## 2024-06-12 DIAGNOSIS — M79609 Pain in unspecified limb: Secondary | ICD-10-CM

## 2024-06-12 DIAGNOSIS — D689 Coagulation defect, unspecified: Secondary | ICD-10-CM | POA: Diagnosis not present

## 2024-06-18 ENCOUNTER — Encounter: Payer: Self-pay | Admitting: Podiatry

## 2024-06-18 NOTE — Progress Notes (Signed)
  Subjective:  Patient ID: Becky Farrell, female    DOB: 09/26/1934,  MRN: [id number]  89 y.o. female presents to clinic with  at risk foot care with h/o coagulation defect and painful mycotic toenails of both feet that are difficult to trim. Pain interferes with daily activities and wearing enclosed shoe gear comfortably.  Chief Complaint  Patient presents with   Nail Problem    Pt is here for [facility] PCP is Dr Caro and LOV was in May.    New problem(s): None   PCP is Caro Harlene POUR, NP.  Allergies  Allergen Reactions   Clobetasol Other (See Comments)    vertigo   Clobetasol Propionate Other (See Comments)    This is ointment form UNSPECIFIED REACTION    Phenergan [Promethazine Hcl] Other (See Comments)    Rapid heart rate   Dextromethorphan Palpitations and Other (See Comments)    promethazine with DM, rapid hearbeat   Promethazine Other (See Comments) and Palpitations    Rapid heart bea   Zithromax [Azithromycin] Palpitations and Other (See Comments)    Per OSH report, pt unable to recall Rapid heart rate    Review of Systems: Negative except as noted in the HPI.   Objective:  Becky Farrell is a pleasant 89 y.o. female WD, WN in NAD. AAO x 3.  Vascular Examination: Vascular status intact b/l with palpable pedal pulses. CFT immediate b/l. No edema. No pain with calf compression b/l. Skin temperature gradient WNL b/l. No ischemia or gangrene noted b/l LE. No cyanosis or clubbing noted b/l LE.  Neurological Examination: Sensation grossly intact b/l with 10 gram monofilament. Vibratory sensation intact b/l.   Dermatological Examination: Pedal skin with normal turgor, texture and tone b/l. Toenails 1-5 b/l thick, discolored, elongated with subungual debris and pain on dorsal palpation. No hyperkeratotic lesions noted b/l.   Musculoskeletal Examination: Muscle strength 5/5 to b/l LE. No pain, crepitus or joint limitation noted with ROM bilateral LE. No gross bony  deformities bilaterally.  Radiographs: None  Last A1c:       No data to display           Assessment:   1. Pain due to onychomycosis of nail   2. Coagulation defect [facility])     Plan:  Consent given for treatment. Patient examined. All patient's and/or POA's questions/concerns addressed on today's visit. Toenails 1-5 debrided in length and girth without incident. Continue soft, supportive shoe gear daily. Report any pedal injuries to medical professional. Call office if there are any questions/concerns. -Patient/POA to call should there be question/concern in the interim.  Return in about 3 months (around 09/12/2024).  Delon LITTIE Merlin, DPM      [place] LOCATION: [address] [place] [address]                   Office [phone]   [facility] LOCATION: [address] [place], [place] [address] Office [phone]

## 2024-06-25 ENCOUNTER — Encounter: Payer: Self-pay | Admitting: Nurse Practitioner

## 2024-06-25 ENCOUNTER — Ambulatory Visit: Admitting: Nurse Practitioner

## 2024-06-25 VITALS — BP 142/86 | HR 83 | Temp 97.3°F | Ht 63.0 in | Wt 126.4 lb

## 2024-06-25 DIAGNOSIS — H9193 Unspecified hearing loss, bilateral: Secondary | ICD-10-CM

## 2024-06-25 DIAGNOSIS — K5904 Chronic idiopathic constipation: Secondary | ICD-10-CM | POA: Diagnosis not present

## 2024-06-25 DIAGNOSIS — I10 Essential (primary) hypertension: Secondary | ICD-10-CM | POA: Diagnosis not present

## 2024-06-25 DIAGNOSIS — R2689 Other abnormalities of gait and mobility: Secondary | ICD-10-CM

## 2024-06-25 NOTE — Progress Notes (Signed)
 Careteam: Patient Care Team: Becky Harlene POUR, NP as PCP - General (Geriatric Medicine) Darron Deatrice LABOR, MD as PCP - Cardiology (Cardiology) Lenn Aran, MD as Radiation Oncologist (Radiation Oncology) Babara Call, MD as Consulting Physician (Oncology) Mittie Gaskin, MD as Referring Physician (Ophthalmology) Franchester Mikey DEL, PA-C as Physician Assistant (Cardiology) PLACE OF SERVICE:  [facility]   Advanced Directive information    Allergies  Allergen Reactions   Clobetasol Other (See Comments)    vertigo   Clobetasol Propionate Other (See Comments)    This is ointment form UNSPECIFIED REACTION    Phenergan [Promethazine Hcl] Other (See Comments)    Rapid heart rate   Dextromethorphan Palpitations and Other (See Comments)    promethazine with DM, rapid hearbeat   Promethazine Other (See Comments) and Palpitations    Rapid heart bea   Zithromax [Azithromycin] Palpitations and Other (See Comments)    Per OSH report, pt unable to recall Rapid heart rate    Chief Complaint  Patient presents with   Gait Problem    Unsteady gait the last few day and double vision yesterday at church.      HPI: Patient is a 89 y.o. female seen in today at [facility]  Discussed the use of AI scribe software for clinical note transcription with the patient, who gave verbal consent to proceed.  History of Present Illness Becky Farrell is an 89 year old female with hypertension who presents with dizziness and balance issues.  She has been experiencing dizziness and balance issues. She is currently on lisinopril  for hypertension. She is undergoing physical therapy for balance issues, which she started after an assessment yesterday. The therapy is scheduled for two days a week over twelve weeks.  She describes an episode of diplopia that occurred yesterday during a church event. While sitting down, she looked up and saw two images of a performer, but the vision returned to  normal after a few seconds. No dizziness was noted at that moment, and the diplopia has not recurred since.  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 She does not wear hearing aids. Her hearing has changed with age, impacting her ability to drive.  Her gastrointestinal system is functioning well with the use of Miralax. She avoids straining during bowel movements, which has been effective.  She does not check her blood pressure at home but it will be monitored during her physical therapy sessions.   Review of Systems:  Review of Systems  Constitutional:  Negative for chills, fever and weight loss.  HENT:  Negative for tinnitus.   Respiratory:  Negative for cough, sputum production and shortness of breath.   Cardiovascular:  Negative for chest pain, palpitations and leg swelling.  Gastrointestinal:  Negative for abdominal pain, constipation, diarrhea and heartburn.  Genitourinary:  Negative for dysuria, frequency and urgency.  Musculoskeletal:  Negative for back pain, falls, joint pain and myalgias.  Skin: Negative.   Neurological:  Positive for dizziness and weakness. Negative for headaches.  Psychiatric/Behavioral:  Negative for depression and memory loss. The patient is nervous/anxious. The patient does not have insomnia.     Past Medical History:  Diagnosis Date   Colon polyps    Family history of breast cancer    History of pulmonary embolism 2011   Hx of melanoma of skin 2003   Right leg   Hypertension    Melanoma (HCC)  2003   right lower leg. [place]ated in [place].   Personal history of radiation therapy    Seizures (HCC) 1993   no meds since (approx) 2014   Vertigo    x1. R/T perforated ear drum prior to 2013   Past Surgical History:  Procedure Laterality Date   ABDOMINAL HYSTERECTOMY  05/2014   BREAST BIOPSY Right 08/20/2018   invasive mammary carcinoma/DCIS    BREAST LUMPECTOMY WITH  RADIOACTIVE SEED LOCALIZATION Right 10/10/2018   Procedure: BREAST LUMPECTOMY WITH RADIOACTIVE SEED LOCALIZATION;  Surgeon: Curvin Deward MOULD, MD;  Location: [facility] OR;  Service: General;  Laterality: Right;   CATARACT EXTRACTION W/PHACO Right 01/06/2018   Procedure: CATARACT EXTRACTION PHACO AND INTRAOCULAR LENS PLACEMENT (IOC) RIGHT;  Surgeon: Mittie Gaskin, MD;  Location: [facility];  Service: Ophthalmology;  Laterality: Right;   CATARACT EXTRACTION W/PHACO Left 01/29/2018   Procedure: CATARACT EXTRACTION PHACO AND INTRAOCULAR LENS PLACEMENT (IOC) LEFT;  Surgeon: Mittie Gaskin, MD;  Location: [facility];  Service: Ophthalmology;  Laterality: Left;   left hand surgery  05/2011   s/p fall   OTHER SURGICAL HISTORY  2003   Removal of lymphoma from melonoma 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    Social History:   reports that she has never smoked. She has never used smokeless tobacco. She reports that she does not drink alcohol and does not use drugs.  Family History  Problem Relation Age of Onset   Heart disease Mother    Aortic aneurysm Father    Breast cancer Daughter 78   Breast cancer Niece 75    Medications: Patient's Medications  New Prescriptions   No medications on file  Previous Medications   ASPIRIN  81 MG TABLET    Take 81 mg by mouth daily.   CHOLECALCIFEROL (VITAMIN D3) 2000 UNITS TABS    Take 2,000 Units by mouth daily.    DENOSUMAB  (PROLIA ) 60 MG/ML SOSY INJECTION    Inject 60 mg into the skin every 6 (six) months.   KETOCONAZOLE  (NIZORAL ) 2 % CREAM    Apply between toes and around toenails every night.   LISINOPRIL  (ZESTRIL ) 20 MG TABLET    Take 1 tablet (20 mg total) by mouth daily.   METOPROLOL  TARTRATE (LOPRESSOR ) 25 MG TABLET    TAKE 1 TABLET BY MOUTH TWICE A DAY   METRONIDAZOLE  (METROCREAM ) 0.75 % CREAM    Apply to nose every night for rosacea.   POLYETHYLENE GLYCOL (MIRALAX / GLYCOLAX) PACKET    Take 17 g by mouth every other day.   VITAMIN B-12  (CYANOCOBALAMIN ) 1000 MCG TABLET    Take 1,000 mcg by mouth daily.   WARFARIN (COUMADIN ) 5 MG TABLET    Take 5mg  daily except Monday and Thursday take 7.5mg .   WHEAT DEXTRIN (BENEFIBER PO)    Take 2 each by mouth every other day.  Modified Medications   No medications on file  Discontinued Medications   No medications on file    Physical Exam:  Vitals:   06/25/24 0941 06/25/24 0945  BP: (!) 158/94 (!) 142/86  Pulse: 83   Temp: (!) 97.3 F (36.3 C)   SpO2: 97%   Weight: 126 lb 6.4 oz (57.3 kg)   Height: 5' 3 (1.6 m)    Body mass index is 22.39 kg/m. Wt Readings from Last 3 Encounters:  06/25/24 126 lb 6.4 oz (57.3 kg)  06/12/24 127 lb 4 oz (57.7 kg)  06/03/24 127 lb 4 oz (57.7 kg)    Physical Exam  Constitutional:      General: She is not in acute distress.    Appearance: She is well-developed. She is not diaphoretic.  HENT:     Head: Normocephalic and atraumatic.     Mouth/Throat:     Pharynx: No oropharyngeal exudate.  Eyes:     Conjunctiva/sclera: Conjunctivae normal.     Pupils: Pupils are equal, round, and reactive to light.  Cardiovascular:     Rate and Rhythm: Normal rate and regular rhythm.     Heart sounds: Normal heart sounds.  Pulmonary:     Effort: Pulmonary effort is normal.     Breath sounds: Normal breath sounds.  Abdominal:     General: Bowel sounds are normal.     Palpations: Abdomen is soft.  Musculoskeletal:     Cervical back: Normal range of motion and neck supple.     Right lower leg: No edema.     Left lower leg: No edema.  Skin:    General: Skin is warm and dry.  Neurological:     Mental Status: She is alert.  Psychiatric:        Mood and Affect: Mood normal.     Labs reviewed: Basic Metabolic Panel: Recent Labs    09/17/23 1433 12/11/23 1758 03/17/24 1312  NA 137 138 135  K 4.2 3.8 4.1  CL 103 103 103  CO2 28 26 24   GLUCOSE 94 94 158*  BUN 21 19 21   CREATININE 0.76 0.64 0.67  CALCIUM  9.9 9.3 9.5   Liver Function  Tests: Recent Labs    09/17/23 1433  AST 21  ALT 15  ALKPHOS 63  BILITOT 0.7  PROT 6.8  ALBUMIN 4.0   No results for input(s): LIPASE, AMYLASE in the last 8760 hours. No results for input(s): AMMONIA in the last 8760 hours. CBC: Recent Labs    09/17/23 1433 12/11/23 1758  WBC 5.3 5.6  NEUTROABS 3.6  --   HGB 13.0 13.4  HCT 39.3 39.4  MCV 96.1 94.9  PLT 168 179   Lipid Panel: No results for input(s): CHOL, HDL, LDLCALC, TRIG, CHOLHDL, LDLDIRECT in the last 8760 hours. TSH: No results for input(s): TSH in the last 8760 hours. A1C: Lab Results  Component Value Date   HGBA1C 5.6 04/05/2023     Assessment/Plan Assessment and Plan Assessment & Plan Balance issues Undergoing physical therapy to improve balance.  - Proceed with scheduled physical therapy at [facility]  Hearing impairment Ongoing, continue supportive care  Double vision Brief episode resolved spontaneously. No dizziness reported by patient. - Monitor for recurrence and report if it occurs. - Continue follow-ups with neurologist.  Hypertension On lisinopril  but recently dose reduced due to dizziness. Recent readings slightly elevated but not concerning. Will get routine BP checks during therapy. - Monitor blood pressure during physical therapy sessions. - Ensure lisinopril  prescription is filled at 20 mg.  Constipation Improvement with Miralax. Advised to avoid straining. - Continue Miralax as needed. - Avoid straining during bowel movements.  Follow-up Aware of appointment scheduling and upcoming blood work for INR. - Attend blood work appointment next month for INR monitoring.   Tamyra Fojtik K. Becky BODILY  [facility] [phone]

## 2024-07-13 LAB — PROTIME-INR
INR: 2.2 — ABNORMAL HIGH
Prothrombin Time: 22.4 s — ABNORMAL HIGH (ref 9.0–11.5)

## 2024-07-20 ENCOUNTER — Other Ambulatory Visit: Payer: Self-pay | Admitting: Nurse Practitioner

## 2024-07-20 NOTE — Telephone Encounter (Signed)
 High risk warning

## 2024-07-23 ENCOUNTER — Telehealth: Payer: Self-pay

## 2024-07-23 ENCOUNTER — Encounter: Admitting: Nurse Practitioner

## 2024-07-23 NOTE — Telephone Encounter (Signed)
 Copied from CRM #[id number]. Topic: General - Other >> Jul 23, 2024  9:51 AM Susanna ORN wrote: Reason for CRM: Patient states she has an appt with Dr. Caro today at 10AM at [facility]. She stated that she's been waiting for the nurse to come by this morning at 8:30 but she still hasn't come. Patient wanted to reschedule for a later time this afternoon but Dr. Caro doesn't have anything available. Called [facility] & spoke with Ozell and he stated that Dr. Caro doesn't have afternoon appts. Explained this information to patient. Patient states she's going to wait around for the nurse a little longer. She's asking if the nurse or Donzell can give her a call. CB #: [phone].

## 2024-07-23 NOTE — Telephone Encounter (Signed)
 Donzell called and she rescheduled

## 2024-07-24 ENCOUNTER — Telehealth: Payer: Self-pay | Admitting: Student

## 2024-07-24 DIAGNOSIS — I10 Essential (primary) hypertension: Secondary | ICD-10-CM

## 2024-07-24 MED ORDER — LISINOPRIL 40 MG PO TABS: 20.0000 mg | ORAL_TABLET | Freq: Every day | ORAL | 4 refills | Status: DC

## 2024-07-24 MED ORDER — HYDROCHLOROTHIAZIDE 12.5 MG PO TABS: 12.5000 mg | ORAL_TABLET | Freq: Every day | ORAL | 3 refills | Status: AC

## 2024-07-24 NOTE — Telephone Encounter (Signed)
 Received call from campus nurse that BP >200/100 in therapy for days. Increase BP meds. Add hydrochlorothiazide . Labs on Monday. Daily EMT BP Checks through the weekend. F/u PCP Tuesday.

## 2024-07-27 LAB — BASIC METABOLIC PANEL WITHOUT GFR
BUN: 25 mg/dL (ref 7–25)
CO2: 30 mmol/L (ref 20–32)
Calcium: 10.2 mg/dL (ref 8.6–10.4)
Chloride: 100 mmol/L (ref 98–110)
Creat: 0.74 mg/dL (ref 0.60–0.95)
Glucose, Bld: 93 mg/dL (ref 65–99)
Potassium: 4.1 mmol/L (ref 3.5–5.3)
Sodium: 139 mmol/L (ref 135–146)

## 2024-07-28 ENCOUNTER — Encounter: Payer: Self-pay | Admitting: Nurse Practitioner

## 2024-07-28 ENCOUNTER — Ambulatory Visit (INDEPENDENT_AMBULATORY_CARE_PROVIDER_SITE_OTHER): Admitting: Nurse Practitioner

## 2024-07-28 VITALS — BP 154/82 | HR 74 | Temp 97.6°F | Ht 63.0 in | Wt 125.6 lb

## 2024-07-28 DIAGNOSIS — R42 Dizziness and giddiness: Secondary | ICD-10-CM

## 2024-07-28 DIAGNOSIS — I10 Essential (primary) hypertension: Secondary | ICD-10-CM

## 2024-07-28 DIAGNOSIS — Z7901 Long term (current) use of anticoagulants: Secondary | ICD-10-CM | POA: Diagnosis not present

## 2024-07-28 NOTE — Patient Instructions (Addendum)
 Continue lisinopril  40 mg by mouth daily Continue hydrochlorothiazide  12.5 mg daily Continue metoprolol  25 mg by mouth twice daily for blood pressure.   Check blood pressure weekly after you have had medication- make sure you are sitting for at least 5 mins.   You will get your next BLOOD draw on 08/17/2024

## 2024-07-28 NOTE — Progress Notes (Signed)
 Careteam: Patient Care Team: Caro Harlene POUR, NP as PCP - General (Geriatric Medicine) Darron Deatrice LABOR, MD as PCP - Cardiology (Cardiology) Lenn Aran, MD as Radiation Oncologist (Radiation Oncology) Babara Call, MD as Consulting Physician (Oncology) Mittie Gaskin, MD as Referring Physician (Ophthalmology) Franchester Mikey DEL, PA-C as Physician Assistant (Cardiology)  PLACE OF SERVICE:  [facility]  Advanced Directive information Does Patient Have a Medical Advance Directive?: Yes, Type of Advance Directive: Healthcare Power of [place];Living will, Does patient want to make changes to medical advance directive?: No - Patient declined  Allergies  Allergen Reactions   Clobetasol Other (See Comments)    vertigo   Clobetasol Propionate Other (See Comments)    This is ointment form UNSPECIFIED REACTION    Phenergan [Promethazine Hcl] Other (See Comments)    Rapid heart rate   Dextromethorphan Palpitations and Other (See Comments)    promethazine with DM, rapid hearbeat   Promethazine Other (See Comments) and Palpitations    Rapid heart bea   Zithromax [Azithromycin] Palpitations and Other (See Comments)    Per OSH report, pt unable to recall Rapid heart rate    Chief Complaint  Patient presents with   Hypertension    Hypertension.  07/23/24 142/75 07/25/24 142/102 07/26/24 138/92 07/27/24 148/82      HPI: The patient is an 89 year old female presenting today for evaluation of her hypertension.   Last week, she experienced elevated blood pressure readings, as high as 200/100 prompting a restart of hydrochlorothiazide  at a dosage of 12.5 mg orally daily and increase lisinopril  to 40 mg daily- previously decreased to 20 mg daily by cardiologist. Today's blood pressure measurements were 162/90 mmHg, which was later rechecked at 154/82 mmHg.   The patient reports that her home blood pressure readings are consistently lower than those recorded in the clinical setting  and expresses concern regarding potential white coat syndrome.  Currently, her medication regimen includes metoprolol  at 25 mg twice daily, lisinopril  at increased 40 mg daily, and the recently re-initiated hydrochlorothiazide  12.5mg  daily.  Additionally, the patient mentions experiencing occasional palpitations, particularly during bowel movements, but denies any shortness of breath or chest pain. Notably, she reports improvement in her leg swelling over the past few days.  Review of Systems:  Review of Systems  Constitutional: Negative.   HENT:  Positive for hearing loss.   Respiratory: Negative.    Cardiovascular:  Positive for palpitations and leg swelling.  Gastrointestinal: Negative.   Genitourinary: Negative.   Neurological:  Positive for dizziness.    Past Medical History:  Diagnosis Date   Colon polyps    Family history of breast cancer    History of pulmonary embolism 2011   Hx of melanoma of skin 2003   Right leg   Hypertension    Melanoma (HCC) 2003   right lower leg. Treated in [place].   Personal history of radiation therapy    Seizures (HCC) 1993   no meds since (approx) 2014   Vertigo    x1. R/T perforated ear drum prior to 2013   Past Surgical History:  Procedure Laterality Date   ABDOMINAL HYSTERECTOMY  05/2014   BREAST BIOPSY Right 08/20/2018   invasive mammary carcinoma/DCIS    BREAST LUMPECTOMY WITH RADIOACTIVE SEED LOCALIZATION Right 10/10/2018   Procedure: BREAST LUMPECTOMY WITH RADIOACTIVE SEED LOCALIZATION;  Surgeon: Curvin Deward MOULD, MD;  Location: [facility] OR;  Service: General;  Laterality: Right;   CATARACT EXTRACTION W/PHACO Right 01/06/2018   Procedure: CATARACT EXTRACTION PHACO AND INTRAOCULAR  LENS PLACEMENT (IOC) RIGHT;  Surgeon: Mittie Gaskin, MD;  Location: [facility];  Service: Ophthalmology;  Laterality: Right;   CATARACT EXTRACTION W/PHACO Left 01/29/2018   Procedure: CATARACT EXTRACTION PHACO AND INTRAOCULAR LENS PLACEMENT (IOC)  LEFT;  Surgeon: Mittie Gaskin, MD;  Location: [facility];  Service: Ophthalmology;  Laterality: Left;   left hand surgery  05/2011   s/p fall   OTHER SURGICAL HISTORY  2003   Removal of lymphoma from melonoma 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    Social History:   reports that she has never smoked. She has never used smokeless tobacco. She reports that she does not drink alcohol and does not use drugs.  Family History  Problem Relation Age of Onset   Heart disease Mother    Aortic aneurysm Father    Breast cancer Daughter 13   Breast cancer Niece 14    Medications: Patient's Medications  New Prescriptions   No medications on file  Previous Medications   ASPIRIN  81 MG TABLET    Take 81 mg by mouth daily.   CHOLECALCIFEROL (VITAMIN D3) 2000 UNITS TABS    Take 2,000 Units by mouth daily.    DENOSUMAB  (PROLIA ) 60 MG/ML SOSY INJECTION    Inject 60 mg into the skin every 6 (six) months.   HYDROCHLOROTHIAZIDE  (HYDRODIURIL ) 12.5 MG TABLET    Take 1 tablet (12.5 mg total) by mouth daily.   KETOCONAZOLE  (NIZORAL ) 2 % CREAM    Apply between toes and around toenails every night.   LISINOPRIL  (ZESTRIL ) 40 MG TABLET    Take 0.5 tablets (20 mg total) by mouth daily.   METOPROLOL  TARTRATE (LOPRESSOR ) 25 MG TABLET    TAKE 1 TABLET BY MOUTH TWICE A DAY   METRONIDAZOLE  (METROCREAM ) 0.75 % CREAM    Apply to nose every night for rosacea.   POLYETHYLENE GLYCOL (MIRALAX / GLYCOLAX) PACKET    Take 17 g by mouth every other day.   VITAMIN B-12 (CYANOCOBALAMIN ) 1000 MCG TABLET    Take 1,000 mcg by mouth daily.   WARFARIN (COUMADIN ) 5 MG TABLET    TAKE 1 TABLET BY MOUTH EVERY DAY EXCEPT MONDAY AND THURSDAY   WHEAT DEXTRIN (BENEFIBER PO)    Take 2 each by mouth every other day.  Modified Medications   No medications on file  Discontinued Medications   No medications on file    Physical Exam:  Vitals:   07/28/24 1057 07/28/24 1103  BP: (!) 162/90 (!) 154/82  Pulse: 74   Temp: 97.6 F (36.4 C)    SpO2: 98%   Weight: 125 lb 9.6 oz (57 kg)   Height: 5' 3 (1.6 m)    Body mass index is 22.25 kg/m. Wt Readings from Last 3 Encounters:  07/28/24 125 lb 9.6 oz (57 kg)  06/25/24 126 lb 6.4 oz (57.3 kg)  06/12/24 127 lb 4 oz (57.7 kg)    Physical Exam Vitals reviewed.  Constitutional:      Appearance: Normal appearance. She is normal weight.  HENT:     Head: Normocephalic and atraumatic.     Mouth/Throat:     Mouth: Mucous membranes are moist.     Pharynx: Oropharynx is clear.  Eyes:     Conjunctiva/sclera: Conjunctivae normal.  Cardiovascular:     Rate and Rhythm: Normal rate and regular rhythm.     Pulses: Normal pulses.     Heart sounds: Normal heart sounds.  Pulmonary:     Effort: Pulmonary effort is normal.  Breath sounds: Normal breath sounds.  Abdominal:     General: Bowel sounds are normal.     Palpations: Abdomen is soft.  Musculoskeletal:     Right lower leg: Edema present.     Left lower leg: Edema present.  Skin:    General: Skin is warm and dry.     Findings: Bruising present.  Neurological:     Mental Status: She is alert and oriented to person, place, and time. Mental status is at baseline.  Psychiatric:        Mood and Affect: Mood normal.        Behavior: Behavior normal.     Labs reviewed: Basic Metabolic Panel: Recent Labs    12/11/23 1758 03/17/24 1312 07/27/24 0811  NA 138 135 139  K 3.8 4.1 4.1  CL 103 103 100  CO2 26 24 30   GLUCOSE 94 158* 93  BUN 19 21 25   CREATININE 0.64 0.67 0.74  CALCIUM  9.3 9.5 10.2   Liver Function Tests: Recent Labs    09/17/23 1433  AST 21  ALT 15  ALKPHOS 63  BILITOT 0.7  PROT 6.8  ALBUMIN 4.0   No results for input(s): LIPASE, AMYLASE in the last 8760 hours. No results for input(s): AMMONIA in the last 8760 hours. CBC: Recent Labs    09/17/23 1433 12/11/23 1758  WBC 5.3 5.6  NEUTROABS 3.6  --   HGB 13.0 13.4  HCT 39.3 39.4  MCV 96.1 94.9  PLT 168 179   Lipid Panel: No  results for input(s): CHOL, HDL, LDLCALC, TRIG, CHOLHDL, LDLDIRECT in the last 8760 hours. TSH: No results for input(s): TSH in the last 8760 hours. A1C: Lab Results  Component Value Date   HGBA1C 5.6 04/05/2023     Assessment/Plan 1. HTN, goal below 140/90 (Primary) Improved bp at this time. - Basic Metabolic Panel Without GFR ordered to be collected with next INR on 9/15 - Continue getting BP checked weekly - Continue low sodium diet - Continue metoprolol , lisinopril , and hydrochlorothiazide  as prescribed   2. Lightheadedness - reminded pt to continue to stand slowly from sitting position - Continue fall prevention methods  3. Chronic anticoagulation - due to hx of PE, INR has been at goal; continue warfarin as prescribed  : Keep follow up appointments as scheduled.   Waylan Rabon, RN DNP-AGPCNP Student -I personally was present during the history, physical exam and medical decision-making activities of this service and have verified that the service and findings are accurately documented in the student's note Sameeha Rockefeller K. Caro BODILY [facility] [phone]

## 2024-08-17 LAB — BASIC METABOLIC PANEL WITHOUT GFR
BUN: 17 mg/dL (ref 7–25)
CO2: 31 mmol/L (ref 20–32)
Calcium: 9.9 mg/dL (ref 8.6–10.4)
Chloride: 96 mmol/L — ABNORMAL LOW (ref 98–110)
Creat: 0.77 mg/dL (ref 0.60–0.95)
Glucose, Bld: 124 mg/dL (ref 65–139)
Potassium: 4.1 mmol/L (ref 3.5–5.3)
Sodium: 135 mmol/L (ref 135–146)

## 2024-08-17 LAB — PROTIME-INR
INR: 2.3 — ABNORMAL HIGH
Prothrombin Time: 23.6 s — ABNORMAL HIGH (ref 9.0–11.5)

## 2024-08-18 ENCOUNTER — Non-Acute Institutional Stay: Admitting: Nurse Practitioner

## 2024-08-18 ENCOUNTER — Encounter: Payer: Self-pay | Admitting: Nurse Practitioner

## 2024-08-18 VITALS — BP 136/88 | HR 61 | Temp 96.4°F | Ht 63.0 in | Wt 125.6 lb

## 2024-08-18 DIAGNOSIS — Z7901 Long term (current) use of anticoagulants: Secondary | ICD-10-CM

## 2024-08-18 DIAGNOSIS — I10 Essential (primary) hypertension: Secondary | ICD-10-CM | POA: Diagnosis not present

## 2024-08-18 DIAGNOSIS — F419 Anxiety disorder, unspecified: Secondary | ICD-10-CM

## 2024-08-18 DIAGNOSIS — Z86711 Personal history of pulmonary embolism: Secondary | ICD-10-CM | POA: Diagnosis not present

## 2024-08-18 NOTE — Progress Notes (Signed)
 Careteam: Patient Care Team: Caro Harlene POUR, NP as PCP - General (Geriatric Medicine) Darron Deatrice LABOR, MD as PCP - Cardiology (Cardiology) Lenn Aran, MD as Radiation Oncologist (Radiation Oncology) Babara Call, MD as Consulting Physician (Oncology) Mittie Gaskin, MD as Referring Physician (Ophthalmology) Franchester Mikey DEL, PA-C as Physician Assistant (Cardiology)  PLACE OF SERVICE:  [facility]  Advanced Directive information    Allergies  Allergen Reactions   Clobetasol Other (See Comments)    vertigo   Clobetasol Propionate Other (See Comments)    This is ointment form UNSPECIFIED REACTION    Phenergan [Promethazine Hcl] Other (See Comments)    Rapid heart rate   Dextromethorphan Palpitations and Other (See Comments)    promethazine with DM, rapid hearbeat   Promethazine Other (See Comments) and Palpitations    Rapid heart bea   Zithromax [Azithromycin] Palpitations and Other (See Comments)    Per OSH report, pt unable to recall Rapid heart rate    Chief Complaint  Patient presents with   Medical Management of Chronic Issues    Medical Management of Chronic Issues. Follow up BP and Labs. To discuss need for Zoster, Pneumonia and Flu. Discussed Vaccine Day.    HPI: The patient is an 89 year old female presenting for follow-up of her blood pressure. She is being monitored by [facility] staff and PT and reports of elevated BP multiple times. BP has high has 200/100 reported.  She reports she feels like  her elevated blood pressure is related to her anxiety.  She talks about her husband repeatedly during the visit in regards to being anxious.  Mentions her previous home (which she moved away from 13 years ago)   She denies vision changes, chest pain, or shortness of breath. She recently obtained a new blood pressure (B/P) monitor and requires assistance using it. She typically measures her BP on her left arm, takes deep breaths to relax, and then  rechecks.  She reports occasional palpitations during bowel movements. She developed new nocturnal incontinence since starting hydrochlorothiazide  in the mornings and now wears briefs at night.  She continues to experience dizziness upon standing quickly but is mindful of this.   Current medications: Hydrochlorothiazide  (AM), lisinopril  (AM), metoprolol  BID (AM and PM).  Review of Systems:  Review of Systems  Constitutional: Negative.   HENT:  Positive for hearing loss.   Eyes: Negative.   Respiratory: Negative.    Gastrointestinal: Negative.   Genitourinary:  Positive for frequency and urgency.  Musculoskeletal: Negative.   Neurological:  Positive for tremors. Negative for dizziness.  Psychiatric/Behavioral:  The patient is nervous/anxious.    Past Medical History:  Diagnosis Date   Colon polyps    Family history of breast cancer    History of pulmonary embolism 2011   Hx of melanoma of skin 2003   Right leg   Hypertension    Melanoma (HCC) 2003   right lower leg. Treated in [place].   Personal history of radiation therapy    Seizures (HCC) 1993   no meds since (approx) 2014   Vertigo    x1. R/T perforated ear drum prior to 2013   Past Surgical History:  Procedure Laterality Date   ABDOMINAL HYSTERECTOMY  05/2014   BREAST BIOPSY Right 08/20/2018   invasive mammary carcinoma/DCIS    BREAST LUMPECTOMY WITH RADIOACTIVE SEED LOCALIZATION Right 10/10/2018   Procedure: BREAST LUMPECTOMY WITH RADIOACTIVE SEED LOCALIZATION;  Surgeon: Curvin Deward MOULD, MD;  Location: [facility] OR;  Service: General;  Laterality: Right;  CATARACT EXTRACTION W/PHACO Right 01/06/2018   Procedure: CATARACT EXTRACTION PHACO AND INTRAOCULAR LENS PLACEMENT (IOC) RIGHT;  Surgeon: Mittie Gaskin, MD;  Location: [facility];  Service: Ophthalmology;  Laterality: Right;   CATARACT EXTRACTION W/PHACO Left 01/29/2018   Procedure: CATARACT EXTRACTION PHACO AND INTRAOCULAR LENS PLACEMENT (IOC) LEFT;  Surgeon:  Mittie Gaskin, MD;  Location: [facility];  Service: Ophthalmology;  Laterality: Left;   left hand surgery  05/2011   s/p fall   OTHER SURGICAL HISTORY  2003   Removal of lymphoma from melonoma 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    Social History:   reports that she has never smoked. She has never used smokeless tobacco. She reports that she does not drink alcohol and does not use drugs.  Family History  Problem Relation Age of Onset   Heart disease Mother    Aortic aneurysm Father    Breast cancer Daughter 48   Breast cancer Niece 7    Medications: Patient's Medications  New Prescriptions   No medications on file  Previous Medications   ASPIRIN  81 MG TABLET    Take 81 mg by mouth daily.   CHOLECALCIFEROL (VITAMIN D3) 2000 UNITS TABS    Take 2,000 Units by mouth daily.    DENOSUMAB  (PROLIA ) 60 MG/ML SOSY INJECTION    Inject 60 mg into the skin every 6 (six) months.   HYDROCHLOROTHIAZIDE  (HYDRODIURIL ) 12.5 MG TABLET    Take 1 tablet (12.5 mg total) by mouth daily.   KETOCONAZOLE  (NIZORAL ) 2 % CREAM    Apply between toes and around toenails every night.   LISINOPRIL  (ZESTRIL ) 40 MG TABLET    Take 0.5 tablets (20 mg total) by mouth daily.   METOPROLOL  TARTRATE (LOPRESSOR ) 25 MG TABLET    TAKE 1 TABLET BY MOUTH TWICE A DAY   METRONIDAZOLE  (METROCREAM ) 0.75 % CREAM    Apply to nose every night for rosacea.   POLYETHYLENE GLYCOL (MIRALAX / GLYCOLAX) PACKET    Take 17 g by mouth every other day.   VITAMIN B-12 (CYANOCOBALAMIN ) 1000 MCG TABLET    Take 1,000 mcg by mouth daily.   WARFARIN (COUMADIN ) 5 MG TABLET    TAKE 1 TABLET BY MOUTH EVERY DAY EXCEPT MONDAY AND THURSDAY   WHEAT DEXTRIN (BENEFIBER PO)    Take 2 each by mouth every other day.  Modified Medications   No medications on file  Discontinued Medications   No medications on file    Physical Exam:  Vitals:   08/18/24 1024  BP: 136/88  Pulse: 61  Temp: (!) 96.4 F (35.8 C)  SpO2: 99%  Weight: 125 lb 9.6 oz (57 kg)   Height: 5' 3 (1.6 m)   Body mass index is 22.25 kg/m. Wt Readings from Last 3 Encounters:  08/18/24 125 lb 9.6 oz (57 kg)  07/28/24 125 lb 9.6 oz (57 kg)  06/25/24 126 lb 6.4 oz (57.3 kg)    Physical Exam Vitals reviewed.  Constitutional:      Appearance: Normal appearance.  HENT:     Head: Normocephalic and atraumatic.  Cardiovascular:     Rate and Rhythm: Normal rate and regular rhythm.     Pulses: Normal pulses.     Heart sounds: Normal heart sounds.  Pulmonary:     Effort: Pulmonary effort is normal.     Breath sounds: Normal breath sounds.  Abdominal:     General: Bowel sounds are normal.  Musculoskeletal:     Right lower leg: Edema present.  Left lower leg: Edema present.  Skin:    General: Skin is warm and dry.  Neurological:     Mental Status: She is alert. Mental status is at baseline.  Psychiatric:        Mood and Affect: Mood is anxious.    Labs reviewed: Basic Metabolic Panel: Recent Labs    03/17/24 1312 07/27/24 0811 08/17/24 0925  NA 135 139 135  K 4.1 4.1 4.1  CL 103 100 96*  CO2 24 30 31   GLUCOSE 158* 93 124  BUN 21 25 17   CREATININE 0.67 0.74 0.77  CALCIUM  9.5 10.2 9.9   Liver Function Tests: Recent Labs    09/17/23 1433  AST 21  ALT 15  ALKPHOS 63  BILITOT 0.7  PROT 6.8  ALBUMIN 4.0   No results for input(s): LIPASE, AMYLASE in the last 8760 hours. No results for input(s): AMMONIA in the last 8760 hours. CBC: Recent Labs    09/17/23 1433 12/11/23 1758  WBC 5.3 5.6  NEUTROABS 3.6  --   HGB 13.0 13.4  HCT 39.3 39.4  MCV 96.1 94.9  PLT 168 179   Lipid Panel: No results for input(s): CHOL, HDL, LDLCALC, TRIG, CHOLHDL, LDLDIRECT in the last 8760 hours. TSH: No results for input(s): TSH in the last 8760 hours. A1C: Lab Results  Component Value Date   HGBA1C 5.6 04/05/2023   Assessment/Plan 1. HTN, goal below 140/90 (Primary) - Office BP: 136/88 and 180/100 on recheck - Home BP log shows  persistent elevated readings - Patient uses left arm for BP checks due to right-sided breast cancer history She does not wish to start or adjust any additional medication.  Reports she plans on working on her anxiety -she is aware of risk of her bp being elevated- MI and CVA however does not want to change anything at this time.  - Continue current medications: metoprolol , hydrochlorothiazide , lisinopril   2. Chronic anticoagulation - PT/INR this visit: 23.6/2.3 - Protime-INR ordered for 4 weeks - Continue warfarin as scheduled  3. History of pulmonary embolus (PE) Continues on coumadin .   4. Anxiety - Patient declines medication - Aware that anxiety may contribute to elevated BP - Continue non-pharmacologic anxiety management and following with SW and nurse at [facility].   Follow up for routine check up as scheduled.  Waylan Rabon, RN DNP-AGPCNP Student I personally was present during the history, physical exam and medical decision-making activities of this service and have verified that the service and findings are accurately documented in the student's note Nayquan Evinger K. Caro BODILY [facility] [phone]

## 2024-08-25 ENCOUNTER — Encounter: Payer: Self-pay | Admitting: Oncology

## 2024-08-25 ENCOUNTER — Ambulatory Visit
Admission: RE | Admit: 2024-08-25 | Discharge: 2024-08-25 | Disposition: A | Discharge status: Home / Self Care | Source: Ambulatory Visit | Attending: Oncology | Admitting: Oncology

## 2024-08-25 DIAGNOSIS — Z0000 Encounter for general adult medical examination without abnormal findings: Secondary | ICD-10-CM

## 2024-08-25 DIAGNOSIS — Z1231 Encounter for screening mammogram for malignant neoplasm of breast: Secondary | ICD-10-CM | POA: Insufficient documentation

## 2024-08-27 ENCOUNTER — Encounter: Payer: Self-pay | Admitting: Oncology

## 2024-08-27 ENCOUNTER — Other Ambulatory Visit: Payer: Self-pay | Admitting: Oncology

## 2024-08-27 DIAGNOSIS — R928 Other abnormal and inconclusive findings on diagnostic imaging of breast: Secondary | ICD-10-CM

## 2024-08-27 DIAGNOSIS — N6459 Other signs and symptoms in breast: Secondary | ICD-10-CM

## 2024-09-02 ENCOUNTER — Ambulatory Visit
Admission: RE | Admit: 2024-09-02 | Discharge: 2024-09-02 | Disposition: A | Discharge status: Home / Self Care | Source: Ambulatory Visit | Attending: Oncology | Admitting: Oncology

## 2024-09-02 DIAGNOSIS — N6459 Other signs and symptoms in breast: Secondary | ICD-10-CM | POA: Insufficient documentation

## 2024-09-02 DIAGNOSIS — R928 Other abnormal and inconclusive findings on diagnostic imaging of breast: Secondary | ICD-10-CM | POA: Diagnosis present

## 2024-09-09 ENCOUNTER — Encounter: Payer: Self-pay | Admitting: Oncology

## 2024-09-14 ENCOUNTER — Ambulatory Visit: Admitting: Podiatry

## 2024-09-14 ENCOUNTER — Encounter: Payer: Self-pay | Admitting: Podiatry

## 2024-09-14 DIAGNOSIS — D689 Coagulation defect, unspecified: Secondary | ICD-10-CM | POA: Diagnosis not present

## 2024-09-14 DIAGNOSIS — M79609 Pain in unspecified limb: Secondary | ICD-10-CM | POA: Diagnosis not present

## 2024-09-14 DIAGNOSIS — B351 Tinea unguium: Secondary | ICD-10-CM | POA: Diagnosis not present

## 2024-09-14 NOTE — Progress Notes (Signed)
  Subjective:  Patient ID: Becky Farrell, female    DOB: 08/31/1934,  MRN: [id number]  Becky Farrell presents to clinic today for at risk foot care with h/o coagulation defect and painful elongated mycotic toenails 1-5 bilaterally which are tender when wearing enclosed shoe gear. Pain is relieved with periodic professional debridement.  Chief Complaint  Patient presents with   Toe Pain    RFC. She denies being diabetic. Her PCP is NP [place]   New problem(s): None.   PCP is Caro Harlene POUR, NP.  Allergies  Allergen Reactions   Clobetasol Other (See Comments)    vertigo   Clobetasol Propionate Other (See Comments)    This is ointment form UNSPECIFIED REACTION    Phenergan [Promethazine Hcl] Other (See Comments)    Rapid heart rate   Dextromethorphan Palpitations and Other (See Comments)    promethazine with DM, rapid hearbeat   Promethazine Other (See Comments) and Palpitations    Rapid heart bea   Zithromax [Azithromycin] Palpitations and Other (See Comments)    Per OSH report, pt unable to recall Rapid heart rate    Review of Systems: Negative except as noted in the HPI.  Objective: No changes noted in today's physical examination. There were no vitals filed for this visit. Becky Farrell is a pleasant 89 y.o. female WD, WN in NAD. AAO x 3.  Vascular Examination: Capillary refill time immediate b/l. Palpable pedal pulses. Pedal hair present b/l. Pedal edema absent. No pain with calf compression b/l. Skin temperature gradient WNL b/l. No cyanosis or clubbing b/l. No ischemia or gangrene noted b/l.   Neurological Examination: Sensation grossly intact b/l with 10 gram monofilament. Vibratory sensation intact b/l.   Dermatological Examination: Pedal skin with normal turgor, texture and tone b/l.  No open wounds. No interdigital macerations.   Toenails 1-5 b/l thick, discolored, elongated with subungual debris and pain on dorsal palpation.   No corns, calluses, nor  porokeratotic lesions.  Musculoskeletal Examination: Muscle strength 5/5 to all lower extremity muscle groups bilaterally. No pain, crepitus or joint limitation noted with ROM b/l LE. No gross bony pedal deformities b/l. Patient ambulates independently without assistive aids.  Radiographs: None  Assessment/Plan: 1. Pain due to onychomycosis of nail   2. Coagulation defect   Patient was evaluated and treated. All patient's and/or POA's questions/concerns addressed on today's visit. Mycotic toenails 1-5 debrided in length and girth without incident. Continue soft, supportive shoe gear daily. Report any pedal injuries to medical professional. Call office if there are any quesitons/concerns. -Patient/POA to call should there be question/concern in the interim.   Return in about 3 months (around 12/15/2024).  Delon LITTIE Merlin, DPM      [facility] LOCATION: [address], [place] [address]                   Office [phone]   [facility] LOCATION: [address] [place], [place] [address] Office [phone]

## 2024-09-15 ENCOUNTER — Ambulatory Visit: Payer: Self-pay | Admitting: Nurse Practitioner

## 2024-09-15 DIAGNOSIS — Z7901 Long term (current) use of anticoagulants: Secondary | ICD-10-CM

## 2024-09-15 LAB — PROTIME-INR
INR: 2.4 — ABNORMAL HIGH
Prothrombin Time: 24.6 s — ABNORMAL HIGH (ref 9.0–11.5)

## 2024-09-16 ENCOUNTER — Encounter: Payer: Self-pay | Admitting: Oncology

## 2024-09-16 ENCOUNTER — Inpatient Hospital Stay: Payer: Medicare Other

## 2024-09-16 ENCOUNTER — Inpatient Hospital Stay: Payer: Medicare Other | Admitting: Oncology

## 2024-09-16 ENCOUNTER — Inpatient Hospital Stay: Payer: Medicare Other | Attending: Oncology

## 2024-09-16 VITALS — BP 144/91 | HR 73 | Temp 98.5°F | Resp 18 | Ht 63.0 in | Wt 125.2 lb

## 2024-09-16 DIAGNOSIS — Z803 Family history of malignant neoplasm of breast: Secondary | ICD-10-CM | POA: Diagnosis not present

## 2024-09-16 DIAGNOSIS — Z0000 Encounter for general adult medical examination without abnormal findings: Secondary | ICD-10-CM | POA: Diagnosis not present

## 2024-09-16 DIAGNOSIS — Z79811 Long term (current) use of aromatase inhibitors: Secondary | ICD-10-CM | POA: Insufficient documentation

## 2024-09-16 DIAGNOSIS — Z170 Estrogen receptor positive status [ER+]: Secondary | ICD-10-CM

## 2024-09-16 DIAGNOSIS — Z85820 Personal history of malignant melanoma of skin: Secondary | ICD-10-CM | POA: Diagnosis not present

## 2024-09-16 DIAGNOSIS — Z7901 Long term (current) use of anticoagulants: Secondary | ICD-10-CM | POA: Diagnosis not present

## 2024-09-16 DIAGNOSIS — M810 Age-related osteoporosis without current pathological fracture: Secondary | ICD-10-CM

## 2024-09-16 DIAGNOSIS — C50211 Malignant neoplasm of upper-inner quadrant of right female breast: Secondary | ICD-10-CM

## 2024-09-16 DIAGNOSIS — G629 Polyneuropathy, unspecified: Secondary | ICD-10-CM | POA: Diagnosis not present

## 2024-09-16 DIAGNOSIS — Z86711 Personal history of pulmonary embolism: Secondary | ICD-10-CM

## 2024-09-16 DIAGNOSIS — Z923 Personal history of irradiation: Secondary | ICD-10-CM | POA: Insufficient documentation

## 2024-09-16 DIAGNOSIS — Z1231 Encounter for screening mammogram for malignant neoplasm of breast: Secondary | ICD-10-CM

## 2024-09-16 LAB — CBC WITH DIFFERENTIAL (CANCER CENTER ONLY)
Abs Immature Granulocytes: 0.02 K/uL (ref 0.00–0.07)
Basophils Absolute: 0 K/uL (ref 0.0–0.1)
Basophils Relative: 0 %
Eosinophils Absolute: 0.1 K/uL (ref 0.0–0.5)
Eosinophils Relative: 1 %
HCT: 38.8 % (ref 36.0–46.0)
Hemoglobin: 13.4 g/dL (ref 12.0–15.0)
Immature Granulocytes: 0 %
Lymphocytes Relative: 16 %
Lymphs Abs: 1 K/uL (ref 0.7–4.0)
MCH: 31.9 pg (ref 26.0–34.0)
MCHC: 34.5 g/dL (ref 30.0–36.0)
MCV: 92.4 fL (ref 80.0–100.0)
Monocytes Absolute: 0.7 K/uL (ref 0.1–1.0)
Monocytes Relative: 11 %
Neutro Abs: 4.4 K/uL (ref 1.7–7.7)
Neutrophils Relative %: 72 %
Platelet Count: 202 K/uL (ref 150–400)
RBC: 4.2 MIL/uL (ref 3.87–5.11)
RDW: 12.9 % (ref 11.5–15.5)
WBC Count: 6.2 K/uL (ref 4.0–10.5)
nRBC: 0 % (ref 0.0–0.2)

## 2024-09-16 LAB — CMP (CANCER CENTER ONLY)
ALT: 15 U/L (ref 0–44)
AST: 23 U/L (ref 15–41)
Albumin: 4 g/dL (ref 3.5–5.0)
Alkaline Phosphatase: 56 U/L (ref 38–126)
Anion gap: 8 (ref 5–15)
BUN: 17 mg/dL (ref 8–23)
CO2: 24 mmol/L (ref 22–32)
Calcium: 9.8 mg/dL (ref 8.9–10.3)
Chloride: 100 mmol/L (ref 98–111)
Creatinine: 0.63 mg/dL (ref 0.44–1.00)
GFR, Estimated: >60 mL/min (ref >60)
Glucose, Bld: 95 mg/dL (ref 70–99)
Potassium: 4.4 mmol/L (ref 3.5–5.1)
Sodium: 132 mmol/L — ABNORMAL LOW (ref 135–145)
Total Bilirubin: 0.9 mg/dL (ref 0.0–1.2)
Total Protein: 6.9 g/dL (ref 6.5–8.1)

## 2024-09-16 MED ORDER — DENOSUMAB 60 MG/ML ~~LOC~~ SOSY
60.0000 mg | PREFILLED_SYRINGE | Freq: Once | SUBCUTANEOUS | Status: AC
  Administered 2024-09-16: 60 mg via SUBCUTANEOUS
  Filled 2024-09-16: qty 1

## 2024-09-16 NOTE — Assessment & Plan Note (Signed)
continue long term anticoagulation with coumadin. Follows up with coumadin clinic.

## 2024-09-16 NOTE — Progress Notes (Signed)
 Fell last night at [facility] outside at dark. She states she has abrasions on her rt arm.  09/02/24 u/s and mammogram.

## 2024-09-16 NOTE — Telephone Encounter (Signed)
 Patient Notified and agreed.  Lab orders placed for [facility] 10/15/2024 Printed and placed in [facility] for [place] to go to patient's Home.

## 2024-09-16 NOTE — Progress Notes (Signed)
 Hematology/Oncology Progress note [phone] [phone]    REASON FOR VISIT  Becky Farrell is a 89 y.o. female presents for follow up of stage I right breast cancer and osteoporosis- on prolia   ASSESSMENT & PLAN:   Carcinoma of upper-inner quadrant of right breast in female, estrogen receptor positive (HCC) # Stage T1aNx right breast cancer [ invasive ductal carcinoma with mucinous features] -08/2018  s/p surgery and radiation Labs were reviewed and discussed with patient.   08/2023 Finished 5 years of letrozole  2.5 mg daily   Recent screening and diagnostic mammogram showed no suspicious breast mass.  She has right breast skin thickening.  Likely secondary to previous surgery and radiation.  I recommend patient to establish care with lymphedema clinic.  She declined.  She plans to further discuss with physical therapist at [facility].  Patient is quite fit for his age.  She wishes to continue surveillance mammogram.    History of pulmonary embolus (PE) continue long term anticoagulation with coumadin . Follows up with coumadin  clinic.   Osteoporosis  # Osteoporosis, history of chronic AI use Patient previously declines calcium  supplementation.  Proceed with Prolia  today, every 6 months recommend patient to take calcium  1200 mg daily supplementation.   Orders Placed This Encounter  Procedures   MM 3D SCREENING MAMMOGRAM BILATERAL BREAST    Standing Status:   Future    Expected Date:   09/16/2025    Expiration Date:   12/15/2025    Reason for Exam (SYMPTOM  OR DIAGNOSIS REQUIRED):   Breast cancer    Preferred imaging location?:   [facility]   CMP (Cancer Center only)    Standing Status:   Future    Expected Date:   09/16/2025    Expiration Date:   09/16/2025   CBC with Differential (Cancer Center Only)    Standing Status:   Future    Expected Date:   09/16/2025    Expiration Date:   09/16/2025   Basic Metabolic Panel - Cancer Center Only     Standing Status:   Future    Expected Date:   03/17/2025    Expiration Date:   06/15/2025   Follow up in 6 months lab + prolia   12 months lab MD + prolia ..  All questions were answered. The patient knows to call the clinic with any problems, questions or concerns.  Zelphia Cap, MD, PhD [facility] Hematology Oncology 09/16/2024   PERTINENT ONCOLOGY HISTORY Patient previously followed up by Dr.Corcoran, patient switched care to me on 07/12/21 Extensive medical record review was performed by me  stage T1aNx right breast cancer s/p lumpectomy on 10/10/2018. Pathology revealed a 0.4 cm grade I invasive ductal carcinoma with mucinous features.  There was intermediate grade DCIS.  Margins were uninvolved (1.0 cm posterior margin).  There were fibrocystic changes including apocrine metaplasia.  Tumor was ER + (100%), PR + (90%), and Her2/neu-.  Ki67 was 10%.  Pathologic stage was T1aNx.  CA27.29 was 14.1 on 09/08/2018.    12/16/2018 - 02/03/2019.She received radiation. She received 5040 cGy in 28 fractions with a scar boost of another 1400 cGy using electron beam.   06/09/2019.  Patient is started on letrozole .  #Osteoporosis 07/31/2018 DEXA revealed osteoporosis with a T-score of -3.4 in the right forearm radius and -1.9 in the right femoral neck.   08/09/2020 DEXA revealed osteoporosis with a T-score of -3.2 in the forearm radius.  Right femoral neck revealed osteopenia with a T-score of -2.0.  07/07/2019 patient started Prolia  every 6 months.  #History of pulmonary embolism x2.  Initial event occurred in 2011.  Recurrent pulmonary embolism after holding anticoagulation for 1 month for planned colonoscopy.  Patient is currently on lifelong anticoagulation with Coumadin .  Managed by primary care provider.  #Family history of breast cancer-[daughter and niece].  [facility] genetic testing was negative.  #Patient has history of seizure and previously on Tegretol  and is currently off.  She has not had  any recent seizure/spells she follows up with neurology Dr. Lane. chronic ataxia/sensory loss/difficulty walking.  Neurology note was reviewed.    ER visit on 7/30/ 2023 due to high blood pressure, headache. 07/01/2022 CT head without contrast showed chronic atrophic and ischemic changes 07/02/2022, CT angio head and neck with and without contrast negative for large vessel occlusion or other emergent findings.  Chronic findings please refer to CT reports. 06/22/2022, MRI brain without contrast showed no acute intracranial abnormality.  Mild to moderate chronic microvascular ischemic disease. . INTERVAL HISTORY PAITEN Becky Farrell is a 89 y.o. female who has above history reviewed by me today presents for follow up visit for management of history of stage I right breast cancer, osteoporosis Discussed the use of AI scribe software for clinical note transcription with the patient, who gave verbal consent to proceed.   Patient finished 5 years of letrozole , off treatment.  She experiences occasional shortness of breath, which she attributes to aging. An echocardiogram this year showed good heart function. No current heart issues are reported.  She has been diagnosed with neuropathy and circulation issues and is under the care of a neurologist. She has undergone physical therapy at [facility] for these conditions.   Past Medical History:  Diagnosis Date   Colon polyps    Family history of breast cancer    History of pulmonary embolism 2011   Hx of melanoma of skin 2003   Right leg   Hypertension    Melanoma (HCC) 2003   right lower leg. Treated in [place].   Personal history of radiation therapy    Seizures (HCC) 1993   no meds since (approx) 2014   Vertigo    x1. R/T perforated ear drum prior to 2013    Past Surgical History:  Procedure Laterality Date   ABDOMINAL HYSTERECTOMY  05/2014   BREAST BIOPSY Right 08/20/2018   invasive mammary carcinoma/DCIS    BREAST LUMPECTOMY WITH RADIOACTIVE  SEED LOCALIZATION Right 10/10/2018   Procedure: BREAST LUMPECTOMY WITH RADIOACTIVE SEED LOCALIZATION;  Surgeon: Curvin Deward MOULD, MD;  Location: [facility] OR;  Service: General;  Laterality: Right;   CATARACT EXTRACTION W/PHACO Right 01/06/2018   Procedure: CATARACT EXTRACTION PHACO AND INTRAOCULAR LENS PLACEMENT (IOC) RIGHT;  Surgeon: Mittie Gaskin, MD;  Location: [facility];  Service: Ophthalmology;  Laterality: Right;   CATARACT EXTRACTION W/PHACO Left 01/29/2018   Procedure: CATARACT EXTRACTION PHACO AND INTRAOCULAR LENS PLACEMENT (IOC) LEFT;  Surgeon: Mittie Gaskin, MD;  Location: [facility];  Service: Ophthalmology;  Laterality: Left;   left hand surgery  05/2011   s/p fall   OTHER SURGICAL HISTORY  2003   Removal of lymphoma from melonoma 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     Family History  Problem Relation Age of Onset   Heart disease Mother    Aortic aneurysm Father    Breast cancer Daughter 42   Breast cancer Niece 95    Social History:  reports that she has never smoked. She has never used smokeless tobacco. She reports  that she does not drink alcohol and does not use drugs.  Patient moved from [place]  to [place].  Her daughter and grandchildren live in [place].  She has other children in [place] .   She is a retired Runner, broadcasting/film/video. 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   Allergies:  Allergies  Allergen Reactions   Clobetasol Other (See Comments)    vertigo   Clobetasol Propionate Other (See Comments)    This is ointment form UNSPECIFIED REACTION    Phenergan [Promethazine Hcl] Other (See Comments)    Rapid heart rate   Dextromethorphan Palpitations and Other (See Comments)    promethazine with DM, rapid hearbeat   Promethazine Other (See Comments) and Palpitations    Rapid heart bea   Zithromax [Azithromycin] Palpitations and Other (See Comments)    Per OSH report, pt unable to recall Rapid heart rate    Current Medications: Current Outpatient Medications   Medication Sig Dispense Refill   aspirin  81 MG tablet Take 81 mg by mouth daily.     Cholecalciferol (VITAMIN D3) 2000 UNITS TABS Take 2,000 Units by mouth daily.      denosumab  (PROLIA ) 60 MG/ML SOSY injection Inject 60 mg into the skin every 6 (six) months.     hydrochlorothiazide  (HYDRODIURIL ) 12.5 MG tablet Take 1 tablet (12.5 mg total) by mouth daily. 90 tablet 3   ketoconazole  (NIZORAL ) 2 % cream Apply between toes and around toenails every night. 60 g 11   lisinopril  (ZESTRIL ) 40 MG tablet Take 0.5 tablets (20 mg total) by mouth daily. 30 tablet 4   metoprolol  tartrate (LOPRESSOR ) 25 MG tablet TAKE 1 TABLET BY MOUTH TWICE A DAY 180 tablet 3   metroNIDAZOLE  (METROCREAM ) 0.75 % cream Apply to nose every night for rosacea. 45 g 1   polyethylene glycol (MIRALAX / GLYCOLAX) packet Take 17 g by mouth every other day.     vitamin B-12 (CYANOCOBALAMIN ) 1000 MCG tablet Take 1,000 mcg by mouth daily.     warfarin (COUMADIN ) 5 MG tablet TAKE 1 TABLET BY MOUTH EVERY DAY EXCEPT MONDAY AND THURSDAY 90 tablet 1   Wheat Dextrin (BENEFIBER PO) Take 2 each by mouth every other day.     No current facility-administered medications for this visit.   Review of Systems  Constitutional:  Negative for chills, diaphoresis, fever, malaise/fatigue and weight loss (up 1 lb).  HENT:  Positive for hearing loss. Negative for congestion, ear discharge, ear pain, nosebleeds, sinus pain, sore throat and tinnitus.   Eyes: Negative.  Negative for blurred vision, double vision and photophobia.  Respiratory:  Negative for cough, hemoptysis, sputum production and shortness of breath.        Recurrent PE x 2.  Cardiovascular: Negative.  Negative for chest pain, palpitations, orthopnea, leg swelling and PND.  Gastrointestinal: Negative.  Negative for abdominal pain, blood in stool, constipation, diarrhea, heartburn, melena, nausea and vomiting.  Genitourinary: Negative.  Negative for dysuria, frequency, hematuria and  urgency.  Musculoskeletal:  Positive for joint pain. Negative for back pain, falls, myalgias and neck pain.       Osteoporosis.  Skin: Negative.  Negative for itching and rash.  Neurological:  Positive for tingling (hands and sometimes feet). Negative for dizziness, tremors, sensory change, speech change, focal weakness, seizures (history), weakness and headaches.       Chronic balance issues.  Endo/Heme/Allergies:  Does not bruise/bleed easily (on Coumadin ).  Psychiatric/Behavioral: Negative.  Negative for depression and memory loss. The patient is not nervous/anxious and does not have  insomnia.   All other systems reviewed and are negative.  Performance status (ECOG): 1  Vitals Blood pressure (!) 144/91, pulse 73, temperature 98.5 F (36.9 C), temperature source Tympanic, resp. rate 18, height 5' 3 (1.6 m), weight 125 lb 3.2 oz (56.8 kg), SpO2 98%.   Physical Exam Vitals and nursing note reviewed.  Constitutional:      General: She is not in acute distress.    Appearance: She is well-developed. She is not diaphoretic.  HENT:     Head: Normocephalic and atraumatic.  Eyes:     General: No scleral icterus. Neck:     Vascular: No JVD.  Cardiovascular:     Rate and Rhythm: Normal rate.  Pulmonary:     Effort: Pulmonary effort is normal. No respiratory distress.     Breath sounds: Normal breath sounds. No wheezing or rales.  Abdominal:     General: There is no distension.  Musculoskeletal:        General: No swelling or tenderness.  Skin:    General: Skin is warm and dry.  Neurological:     Mental Status: She is alert and oriented to person, place, and time.  Psychiatric:        Mood and Affect: Mood normal.   Right breast skin lymphedema changes.  No palpable mass or lymphadenopathy.  Labs    Latest Ref Rng & Units 09/16/2024    1:01 PM 12/11/2023    5:58 PM 09/17/2023    2:33 PM  CBC  WBC 4.0 - 10.5 K/uL 6.2  5.6  5.3   Hemoglobin 12.0 - 15.0 g/dL 86.5  86.5  86.9    Hematocrit 36.0 - 46.0 % 38.8  39.4  39.3   Platelets 150 - 400 K/uL 202  179  168       Latest Ref Rng & Units 09/16/2024    1:01 PM 08/17/2024    9:25 AM 07/27/2024    8:11 AM  CMP  Glucose 70 - 99 mg/dL 95  875  93   BUN 8 - 23 mg/dL 17  17  25    Creatinine 0.44 - 1.00 mg/dL 9.36  9.22  9.25   Sodium 135 - 145 mmol/L 132  135  139   Potassium 3.5 - 5.1 mmol/L 4.4  4.1  4.1   Chloride 98 - 111 mmol/L 100  96  100   CO2 22 - 32 mmol/L 24  31  30    Calcium  8.9 - 10.3 mg/dL 9.8  9.9  89.7   Total Protein 6.5 - 8.1 g/dL 6.9     Total Bilirubin 0.0 - 1.2 mg/dL 0.9     Alkaline Phos 38 - 126 U/L 56     AST 15 - 41 U/L 23     ALT 0 - 44 U/L 15

## 2024-09-16 NOTE — Assessment & Plan Note (Addendum)
#   Stage T1aNx right breast cancer [ invasive ductal carcinoma with mucinous features] -08/2018  s/p surgery and radiation Labs were reviewed and discussed with patient.   08/2023 Finished 5 years of letrozole  2.5 mg daily   Recent screening and diagnostic mammogram showed no suspicious breast mass.  She has right breast skin thickening.  Likely secondary to previous surgery and radiation.  I recommend patient to establish care with lymphedema clinic.  She declined.  She plans to further discuss with physical therapist at [facility].  Patient is quite fit for his age.  She wishes to continue surveillance mammogram.

## 2024-09-16 NOTE — Assessment & Plan Note (Addendum)
#   Osteoporosis, history of chronic AI use Patient previously declines calcium  supplementation.  Proceed with Prolia  today, every 6 months recommend patient to take calcium  1200 mg daily supplementation.

## 2024-09-22 ENCOUNTER — Encounter: Payer: Self-pay | Admitting: Oncology

## 2024-10-13 ENCOUNTER — Ambulatory Visit: Payer: Self-pay | Admitting: Nurse Practitioner

## 2024-10-13 ENCOUNTER — Encounter: Payer: Self-pay | Admitting: Nurse Practitioner

## 2024-10-13 VITALS — BP 162/92 | HR 76 | Temp 97.8°F | Ht 63.0 in | Wt 124.0 lb

## 2024-10-13 DIAGNOSIS — Z86711 Personal history of pulmonary embolism: Secondary | ICD-10-CM

## 2024-10-13 DIAGNOSIS — H9193 Unspecified hearing loss, bilateral: Secondary | ICD-10-CM

## 2024-10-13 DIAGNOSIS — I10 Essential (primary) hypertension: Secondary | ICD-10-CM | POA: Diagnosis not present

## 2024-10-13 DIAGNOSIS — M810 Age-related osteoporosis without current pathological fracture: Secondary | ICD-10-CM

## 2024-10-13 DIAGNOSIS — Z853 Personal history of malignant neoplasm of breast: Secondary | ICD-10-CM

## 2024-10-13 DIAGNOSIS — Z7901 Long term (current) use of anticoagulants: Secondary | ICD-10-CM | POA: Diagnosis not present

## 2024-10-13 DIAGNOSIS — F419 Anxiety disorder, unspecified: Secondary | ICD-10-CM

## 2024-10-13 DIAGNOSIS — E7800 Pure hypercholesterolemia, unspecified: Secondary | ICD-10-CM

## 2024-10-13 NOTE — Progress Notes (Signed)
 Careteam: Patient Care Team: Caro Harlene POUR, NP as PCP - General (Geriatric Medicine) Darron Deatrice LABOR, MD as PCP - Cardiology (Cardiology) Lenn Aran, MD as Radiation Oncologist (Radiation Oncology) Babara Call, MD as Consulting Physician (Oncology) Mittie Gaskin, MD as Referring Physician (Ophthalmology) Franchester Mikey DEL, PA-C as Physician Assistant (Cardiology)  PLACE OF SERVICE:  [facility]  Advanced Directive information Does Patient Have a Medical Advance Directive?: Yes, Type of Advance Directive: Healthcare Power of [place];Living will, Does patient want to make changes to medical advance directive?: No - Patient declined  Allergies  Allergen Reactions   Clobetasol Other (See Comments)    vertigo   Clobetasol Propionate Other (See Comments)    This is ointment form UNSPECIFIED REACTION    Phenergan [Promethazine Hcl] Other (See Comments)    Rapid heart rate   Dextromethorphan Palpitations and Other (See Comments)    promethazine with DM, rapid hearbeat   Promethazine Other (See Comments) and Palpitations    Rapid heart bea   Zithromax [Azithromycin] Palpitations and Other (See Comments)    Per OSH report, pt unable to recall Rapid heart rate   Chief Complaint  Patient presents with   Medical Management of Chronic Issues    Medical Management of Chronic Issues.    HPI: The patient is an 89 year old female with history of HTN, HLD, PE on chronic anticoagulation, osteoporosis, and anxiety presenting for a follow-up appointment.   She engaged in extensive conversation during the visit and expressed a peaceful acceptance of aging, stating she will go gracefully when God chooses the time.  She denies chest pain, shortness of breath, and headaches. She reports occasional heart palpitations that resolve with bowel movements. She mentions was born with a hearing impairment and recently read about a potential link between hearing loss and dementia.  However, she has lived with this condition all her life and declines further diagnostic testing.  - MOCA score: 28/30 (October 2025, [place]).  She reports good long-term memory but poor short-term memory, noting improvement on her most recent cognitive test.  Blood pressure today is 162/92. She is not concerned about this reading and states she feels well.  Recent labs include: CMP/CBC (09/16/24): Sodium mildly low at 132. PT/INR (09/14/24): INR 2.4; patient is on warfarin. HbA1c (May 2024): 5.6%; patient requests repeat testing due to concern about diabetes.  She has a history of breast cancer. Her last oncology visit was on 09/16/24. Per oncology notes: Recent screening and diagnostic mammogram showed no suspicious masses. Right breast skin thickening likely secondary to prior surgery and radiation. Recommendation to establish care with a lymphedema clinic, which she declined. She plans to discuss further with her physical therapist at [facility]. She remains physically fit and wishes to continue surveillance mammograms.  The patient declines any medications for memory preservation, citing greater concern about diabetes. She is no longer driving.  She is followed by podiatry, neurology, and cardiology. Her daughter, who resides in [place], has expressed interest in attending appointments via phone due to concern for her mother's well-being however patient declines today when asked if we can call during her appt.   The patient also voiced concern about her nutritional habits, particularly excessive sugar intake, and remains hyperfocused on diabetes management.  She is notably anxious about her husband's health, citing a family history of dementia on his side.  Review of Systems:  Review of Systems  Constitutional: Negative.   HENT:  Positive for hearing loss.   Eyes: Negative.  Respiratory: Negative.    Cardiovascular:  Positive for palpitations.  Gastrointestinal:  Negative.   Genitourinary: Negative.   Musculoskeletal: Negative.   Skin: Negative.   Neurological: Negative.   Psychiatric/Behavioral:  Positive for memory loss. The patient is nervous/anxious.    Past Medical History:  Diagnosis Date   Colon polyps    Family history of breast cancer    History of pulmonary embolism 2011   Hx of melanoma of skin 2003   Right leg   Hypertension    Melanoma (HCC) 2003   right lower leg. [place].   Personal history of radiation therapy    Seizures (HCC) 1993   no meds since (approx) 2014   Vertigo    x1. R/T perforated ear drum prior to 2013   Past Surgical History:  Procedure Laterality Date   ABDOMINAL HYSTERECTOMY  05/2014   BREAST BIOPSY Right 08/20/2018   invasive mammary carcinoma/DCIS    BREAST LUMPECTOMY WITH RADIOACTIVE SEED LOCALIZATION Right 10/10/2018   Procedure: BREAST LUMPECTOMY WITH RADIOACTIVE SEED LOCALIZATION;  Surgeon: Curvin Deward MOULD, MD;  Location: [facility] OR;  Service: General;  Laterality: Right;   CATARACT EXTRACTION W/PHACO Right 01/06/2018   Procedure: CATARACT EXTRACTION PHACO AND INTRAOCULAR LENS PLACEMENT (IOC) RIGHT;  Surgeon: Mittie Gaskin, MD;  Location: [facility];  Service: Ophthalmology;  Laterality: Right;   CATARACT EXTRACTION W/PHACO Left 01/29/2018   Procedure: CATARACT EXTRACTION PHACO AND INTRAOCULAR LENS PLACEMENT (IOC) LEFT;  Surgeon: Mittie Gaskin, MD;  Location: [facility];  Service: Ophthalmology;  Laterality: Left;   left hand surgery  05/2011   s/p fall   OTHER SURGICAL HISTORY  2003   Removal of lymphoma from melonoma 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    Social History:   reports that she has never smoked. She has never used smokeless tobacco. She reports that she does not drink alcohol and does not use drugs.  Family History  Problem Relation Age of Onset   Heart disease Mother    Aortic aneurysm Father    Breast cancer Daughter 23   Breast cancer Niece 25    Medications: Patient's Medications  New Prescriptions   No medications on file  Previous Medications   ASPIRIN  81 MG TABLET    Take 81 mg by mouth daily.   CHOLECALCIFEROL (VITAMIN D3) 2000 UNITS TABS    Take 2,000 Units by mouth daily.    DENOSUMAB  (PROLIA ) 60 MG/ML SOSY INJECTION    Inject 60 mg into the skin every 6 (six) months.   HYDROCHLOROTHIAZIDE  (HYDRODIURIL ) 12.5 MG TABLET    Take 1 tablet (12.5 mg total) by mouth daily.   KETOCONAZOLE  (NIZORAL ) 2 % CREAM    Apply between toes and around toenails every night.   LISINOPRIL  (ZESTRIL ) 20 MG TABLET    Take 20 mg by mouth daily.   METOPROLOL  TARTRATE (LOPRESSOR ) 25 MG TABLET    TAKE 1 TABLET BY MOUTH TWICE A DAY   METRONIDAZOLE  (METROCREAM ) 0.75 % CREAM    Apply to nose every night for rosacea.   POLYETHYLENE GLYCOL (MIRALAX / GLYCOLAX) PACKET    Take 17 g by mouth every other day.   VITAMIN B-12 (CYANOCOBALAMIN ) 1000 MCG TABLET    Take 1,000 mcg by mouth daily.   WARFARIN (COUMADIN ) 5 MG TABLET    TAKE 1 TABLET BY MOUTH EVERY DAY EXCEPT MONDAY AND THURSDAY   WHEAT DEXTRIN (BENEFIBER PO)    Take 2 each by mouth every other day.  Modified Medications  No medications on file  Discontinued Medications   LISINOPRIL  (ZESTRIL ) 40 MG TABLET    Take 0.5 tablets (20 mg total) by mouth daily.   Physical Exam:  Vitals:   10/13/24 1024 10/13/24 1036  BP: (!) 168/96 (!) 162/92  Pulse: 76   Temp: 97.8 F (36.6 C)   SpO2: 97%   Weight: 124 lb (56.2 kg)   Height: 5' 3 (1.6 m)    Body mass index is 21.97 kg/m. Wt Readings from Last 3 Encounters:  10/13/24 124 lb (56.2 kg)  09/16/24 125 lb 3.2 oz (56.8 kg)  08/18/24 125 lb 9.6 oz (57 kg)   Physical Exam Vitals reviewed.  Constitutional:      Appearance: Normal appearance.  HENT:     Head: Normocephalic and atraumatic.     Right Ear: External ear normal.     Left Ear: External ear normal.     Nose: Nose normal.     Mouth/Throat:     Mouth: Mucous membranes are moist.      Pharynx: Oropharynx is clear.  Eyes:     Conjunctiva/sclera: Conjunctivae normal.  Cardiovascular:     Rate and Rhythm: Normal rate and regular rhythm.     Pulses: Normal pulses.     Heart sounds: Normal heart sounds.  Pulmonary:     Effort: Pulmonary effort is normal.     Breath sounds: Normal breath sounds.  Abdominal:     General: Bowel sounds are normal.     Palpations: Abdomen is soft.  Musculoskeletal:        General: Normal range of motion.  Skin:    General: Skin is warm and dry.  Neurological:     General: No focal deficit present.     Mental Status: She is alert. Mental status is at baseline.  Psychiatric:        Attention and Perception: She is inattentive.        Mood and Affect: Mood is anxious.        Speech: Speech normal.        Behavior: Behavior is hyperactive.        Cognition and Memory: Memory is impaired.        Judgment: Judgment is impulsive.    Labs reviewed: Basic Metabolic Panel: Recent Labs    07/27/24 0811 08/17/24 0925 09/16/24 1301  NA 139 135 132*  K 4.1 4.1 4.4  CL 100 96* 100  CO2 30 31 24   GLUCOSE 93 124 95  BUN 25 17 17   CREATININE 0.74 0.77 0.63  CALCIUM  10.2 9.9 9.8   Liver Function Tests: Recent Labs    09/16/24 1301  AST 23  ALT 15  ALKPHOS 56  BILITOT 0.9  PROT 6.9  ALBUMIN 4.0   No results for input(s): LIPASE, AMYLASE in the last 8760 hours. No results for input(s): AMMONIA in the last 8760 hours. CBC: Recent Labs    12/11/23 1758 09/16/24 1301  WBC 5.6 6.2  NEUTROABS  --  4.4  HGB 13.4 13.4  HCT 39.4 38.8  MCV 94.9 92.4  PLT 179 202   Lipid Panel: No results for input(s): CHOL, HDL, LDLCALC, TRIG, CHOLHDL, LDLDIRECT in the last 8760 hours. TSH: No results for input(s): TSH in the last 8760 hours. A1C: Lab Results  Component Value Date   HGBA1C 5.6 04/05/2023   Assessment/Plan  1. Hypertension (Primary) - Goal <140/90 - BP in clinic remains elevated (162/92); patient  visibly anxious, reports history of white coat  syndrome. - Declines changes to current antihypertensive regimen. - Continue current medications: hydrochlorothiazide , lisinopril , and metoprolol . - CMP from October: normal except for mild hyponatremia (Na 132). - Plan: Continue monitoring; reassess at next visit  2. Age-related Osteoporosis (No current pathological fracture) - Last DEXA scan: September 2023. - Continues Prolia  injections per oncology  - Management coordinated with oncology.  3. Chronic Anticoagulation (History of PE/DVT) - Continues warfarin therapy; INR monitored routinely. - Last INR: 2.4 on 09/14/24. - Fall risk discussed; advised caution due to bleeding risk.  4. History of Breast Cancer - Chronic condition; oncology follow-up on 09/16/24. - Recent imaging: no suspicious masses; right breast skin thickening likely post-surgical/radiation changes. - Declined referral to lymphedema clinic; plans to discuss further with physical therapy.  5. History of Pulmonary Embolism - Chronic; managed with ongoing anticoagulation (warfarin). - INR monitoring continues.  6. Pure Hypercholesterolemia - Managed with dietary modifications; not on pharmacologic therapy. - Lipid panel monitored routinely. - Last cholesterol check: May 2024.  7. Anxiety - Declines pharmacologic treatment and therapy at this time. -plans to contact [facility] independently.  8. Bilateral Hearing Loss (Unspecified Type) - Lifelong condition; patient declines hearing aid use. - Will refer to audiology if symptoms worsen.  Follow-Up: Routine follow-up in 3 months, or sooner if acute concerns arise.  Waylan Rabon, RN DNP-AGPCNP Student -I personally was present during the history, physical exam and medical decision-making activities of this service and have verified that the service and findings are accurately documented in the student's note Britini Garcilazo K. Caro BODILY [facility] [phone]

## 2024-10-15 LAB — PROTIME-INR
INR: 3.1 — ABNORMAL HIGH
Prothrombin Time: 30.9 s — ABNORMAL HIGH (ref 9.0–11.5)

## 2024-10-18 ENCOUNTER — Other Ambulatory Visit: Payer: Self-pay | Admitting: Cardiovascular Disease

## 2024-10-22 ENCOUNTER — Encounter: Admitting: Nurse Practitioner

## 2024-10-23 ENCOUNTER — Other Ambulatory Visit: Payer: Self-pay | Admitting: *Deleted

## 2024-10-23 DIAGNOSIS — Z7901 Long term (current) use of anticoagulants: Secondary | ICD-10-CM

## 2024-11-03 ENCOUNTER — Non-Acute Institutional Stay: Admitting: Nurse Practitioner

## 2024-11-03 ENCOUNTER — Encounter: Payer: Self-pay | Admitting: Nurse Practitioner

## 2024-11-03 VITALS — BP 148/84 | HR 67 | Temp 97.1°F | Ht 63.0 in | Wt 125.0 lb

## 2024-11-03 DIAGNOSIS — Z7901 Long term (current) use of anticoagulants: Secondary | ICD-10-CM

## 2024-11-03 DIAGNOSIS — Z86711 Personal history of pulmonary embolism: Secondary | ICD-10-CM | POA: Diagnosis not present

## 2024-11-03 NOTE — Progress Notes (Signed)
 Careteam: Patient Care Team: Caro Harlene POUR, NP as PCP - General (Geriatric Medicine) Darron Deatrice LABOR, MD as PCP - Cardiology (Cardiology) Lenn Aran, MD as Radiation Oncologist (Radiation Oncology) Babara Call, MD as Consulting Physician (Oncology) Mittie Gaskin, MD as Referring Physician (Ophthalmology) Franchester Mikey DEL, PA-C as Physician Assistant (Cardiology) PLACE OF SERVICE:  [facility]   Advanced Directive information    Allergies  Allergen Reactions   Clobetasol Other (See Comments)    vertigo   Clobetasol Propionate Other (See Comments)    This is ointment form UNSPECIFIED REACTION    Phenergan [Promethazine Hcl] Other (See Comments)    Rapid heart rate   Dextromethorphan Palpitations and Other (See Comments)    promethazine with DM, rapid hearbeat   Promethazine Other (See Comments) and Palpitations    Rapid heart bea   Zithromax [Azithromycin] Palpitations and Other (See Comments)    Per OSH report, pt unable to recall Rapid heart rate    Chief Complaint  Patient presents with   Medication Management    Medication Management     HPI: Patient is a 89 y.o. female seen in today  Discussed the use of AI scribe software for clinical note transcription with the patient, who gave verbal consent to proceed.  History of Present Illness Becky Farrell is an 89 year old female with a history of pulmonary embolism who presents for a follow-up on her medication regimen.  She is on warfarin therapy with a dosage of 5 mg daily, increased to 7.5 mg on Mondays and Thursdays. Her last INR was 3.1. She has been on long-term anticoagulation since experiencing a pulmonary embolism during a stressful period while traveling across the [place] . No recent episodes of shortness of breath or leg swelling.  Her recent blood pressure reading was 148/80 mmHg, which she notes is an improvement.   She takes Miralax every other day with Benefiber and  ensures adequate hydration to aid its effectiveness.  She engages in regular physical activity, including water aerobics and ground exercises, as part of a wellness program.    Her anxiety has been managed with deep breathing techniques. She notes improvement in her mood and anxiety levels.     Review of Systems:  Review of Systems  Constitutional:  Negative for chills, fever and weight loss.  HENT:  Negative for tinnitus.   Respiratory:  Negative for cough, sputum production and shortness of breath.   Cardiovascular:  Negative for chest pain, palpitations and leg swelling.  Gastrointestinal:  Negative for abdominal pain, constipation, diarrhea and heartburn.  Genitourinary:  Negative for dysuria, frequency and urgency.  Musculoskeletal:  Negative for back pain, falls, joint pain and myalgias.  Skin: Negative.   Neurological:  Negative for dizziness and headaches.  Psychiatric/Behavioral:  Negative for depression. The patient is nervous/anxious. The patient does not have insomnia.    Past Medical History:  Diagnosis Date   Colon polyps    Family history of breast cancer    History of pulmonary embolism 2011   Hx of melanoma of skin 2003   Right leg   Hypertension    Melanoma (HCC) 2003   right lower leg. [place].   Personal history of radiation therapy    Seizures (HCC) 1993   no meds since (approx) 2014   Vertigo    x1. R/T perforated ear drum prior to 2013   Past Surgical History:  Procedure Laterality Date   ABDOMINAL HYSTERECTOMY  05/2014  BREAST BIOPSY Right 08/20/2018   invasive mammary carcinoma/DCIS    BREAST LUMPECTOMY WITH RADIOACTIVE SEED LOCALIZATION Right 10/10/2018   Procedure: BREAST LUMPECTOMY WITH RADIOACTIVE SEED LOCALIZATION;  Surgeon: Curvin Deward MOULD, MD;  Location: [facility] OR;  Service: General;  Laterality: Right;   CATARACT EXTRACTION W/PHACO Right 01/06/2018   Procedure: CATARACT EXTRACTION PHACO AND INTRAOCULAR LENS PLACEMENT (IOC) RIGHT;   Surgeon: Mittie Gaskin, MD;  Location: [facility];  Service: Ophthalmology;  Laterality: Right;   CATARACT EXTRACTION W/PHACO Left 01/29/2018   Procedure: CATARACT EXTRACTION PHACO AND INTRAOCULAR LENS PLACEMENT (IOC) LEFT;  Surgeon: Mittie Gaskin, MD;  Location: [facility];  Service: Ophthalmology;  Laterality: Left;   left hand surgery  05/2011   s/p fall   OTHER SURGICAL HISTORY  2003   Removal of lymphoma from melonoma 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    Social History:   reports that she has never smoked. She has never used smokeless tobacco. She reports that she does not drink alcohol and does not use drugs.  Family History  Problem Relation Age of Onset   Heart disease Mother    Aortic aneurysm Father    Breast cancer Daughter 62   Breast cancer Niece 36    Medications: Patient's Medications  New Prescriptions   No medications on file  Previous Medications   ASPIRIN  81 MG TABLET    Take 81 mg by mouth daily.   CHOLECALCIFEROL (VITAMIN D3) 2000 UNITS TABS    Take 2,000 Units by mouth daily.    DENOSUMAB  (PROLIA ) 60 MG/ML SOSY INJECTION    Inject 60 mg into the skin every 6 (six) months.   HYDROCHLOROTHIAZIDE  (HYDRODIURIL ) 12.5 MG TABLET    Take 1 tablet (12.5 mg total) by mouth daily.   KETOCONAZOLE  (NIZORAL ) 2 % CREAM    Apply between toes and around toenails every night.   LISINOPRIL  (ZESTRIL ) 20 MG TABLET    Take 20 mg by mouth daily.   METOPROLOL  TARTRATE (LOPRESSOR ) 25 MG TABLET    TAKE 1 TABLET BY MOUTH TWICE A DAY   METRONIDAZOLE  (METROCREAM ) 0.75 % CREAM    Apply to nose every night for rosacea.   POLYETHYLENE GLYCOL (MIRALAX / GLYCOLAX) PACKET    Take 17 g by mouth every other day.   VITAMIN B-12 (CYANOCOBALAMIN ) 1000 MCG TABLET    Take 1,000 mcg by mouth daily.   WARFARIN (COUMADIN ) 5 MG TABLET    TAKE 1 TABLET BY MOUTH EVERY DAY EXCEPT MONDAY AND THURSDAY   WHEAT DEXTRIN (BENEFIBER PO)    Take 2 each by mouth every other day.  Modified Medications    No medications on file  Discontinued Medications   No medications on file    Physical Exam:  Vitals:   11/03/24 0957  BP: (!) 148/84  Pulse: 67  Temp: (!) 97.1 F (36.2 C)  SpO2: 98%  Weight: 125 lb (56.7 kg)  Height: 5' 3 (1.6 m)   Body mass index is 22.14 kg/m. Wt Readings from Last 3 Encounters:  11/03/24 125 lb (56.7 kg)  10/13/24 124 lb (56.2 kg)  09/16/24 125 lb 3.2 oz (56.8 kg)    Physical Exam Constitutional:      General: She is not in acute distress.    Appearance: She is well-developed. She is not diaphoretic.  HENT:     Head: Normocephalic and atraumatic.     Mouth/Throat:     Pharynx: No oropharyngeal exudate.  Eyes:     Conjunctiva/sclera: Conjunctivae normal.  Pupils: Pupils are equal, round, and reactive to light.  Cardiovascular:     Rate and Rhythm: Normal rate and regular rhythm.     Heart sounds: Normal heart sounds.  Pulmonary:     Effort: Pulmonary effort is normal.     Breath sounds: Normal breath sounds.  Abdominal:     General: Bowel sounds are normal.     Palpations: Abdomen is soft.  Musculoskeletal:     Cervical back: Normal range of motion and neck supple.     Right lower leg: No edema.     Left lower leg: No edema.  Skin:    General: Skin is warm and dry.  Neurological:     Mental Status: She is alert.  Psychiatric:        Mood and Affect: Mood normal.     Labs reviewed: Basic Metabolic Panel: Recent Labs    07/27/24 0811 08/17/24 0925 09/16/24 1301  NA 139 135 132*  K 4.1 4.1 4.4  CL 100 96* 100  CO2 30 31 24   GLUCOSE 93 124 95  BUN 25 17 17   CREATININE 0.74 0.77 0.63  CALCIUM  10.2 9.9 9.8   Liver Function Tests: Recent Labs    09/16/24 1301  AST 23  ALT 15  ALKPHOS 56  BILITOT 0.9  PROT 6.9  ALBUMIN 4.0   No results for input(s): LIPASE, AMYLASE in the last 8760 hours. No results for input(s): AMMONIA in the last 8760 hours. CBC: Recent Labs    12/11/23 1758 09/16/24 1301  WBC 5.6  6.2  NEUTROABS  --  4.4  HGB 13.4 13.4  HCT 39.4 38.8  MCV 94.9 92.4  PLT 179 202   Lipid Panel: No results for input(s): CHOL, HDL, LDLCALC, TRIG, CHOLHDL, LDLDIRECT in the last 8760 hours. TSH: No results for input(s): TSH in the last 8760 hours. A1C: Lab Results  Component Value Date   HGBA1C 5.6 04/05/2023     Assessment/Plan Assessment and Plan Assessment & Plan Hypertension Blood pressure controlled with current regimen. Recent reading 148/80 mmHg. Home readings improved. - Continue current antihypertensive medication regimen.  History of pulmonary embolism on chronic anticoagulation On warfarin with last INR 3.1, within therapeutic range. No recurrent thromboembolic events. - Continue warfarin 5 mg daily except 7.5 mg on Mondays and Thursdays. - INR check scheduled on 12/11.  Anxiety Likely would benefit from medication but prefers to management with lifestyle modifications Feels like she is managing well at this time    01/14/2025 to keep follow up as scheduled Ozie Lupe K. Caro BODILY  [facility] [phone]

## 2024-11-05 ENCOUNTER — Other Ambulatory Visit: Payer: Self-pay

## 2024-11-05 DIAGNOSIS — L719 Rosacea, unspecified: Secondary | ICD-10-CM

## 2024-11-05 MED ORDER — METRONIDAZOLE 0.75 % EX CREA: TOPICAL_CREAM | CUTANEOUS | 11 refills | Status: AC

## 2024-11-05 NOTE — Progress Notes (Signed)
 Patient contacted office requesting refills.

## 2024-11-12 ENCOUNTER — Ambulatory Visit: Payer: Self-pay | Admitting: Nurse Practitioner

## 2024-11-12 DIAGNOSIS — Z7901 Long term (current) use of anticoagulants: Secondary | ICD-10-CM

## 2024-11-12 LAB — PROTIME-INR
INR: 3.1 — ABNORMAL HIGH
Prothrombin Time: 31.1 s — ABNORMAL HIGH (ref 9.0–11.5)

## 2024-12-10 LAB — PROTIME-INR
INR: 2.2 — ABNORMAL HIGH
Prothrombin Time: 22.4 s — ABNORMAL HIGH (ref 9.0–11.5)

## 2024-12-11 NOTE — Addendum Note (Signed)
 Addended by: LYNNANN COUGH A on: 12/11/2024 01:24 PM   Modules accepted: Orders

## 2024-12-24 ENCOUNTER — Ambulatory Visit: Admitting: Podiatry

## 2024-12-24 ENCOUNTER — Encounter: Payer: Self-pay | Admitting: Podiatry

## 2024-12-24 DIAGNOSIS — M79609 Pain in unspecified limb: Secondary | ICD-10-CM

## 2024-12-24 DIAGNOSIS — B351 Tinea unguium: Secondary | ICD-10-CM | POA: Diagnosis not present

## 2024-12-24 DIAGNOSIS — D689 Coagulation defect, unspecified: Secondary | ICD-10-CM | POA: Diagnosis not present

## 2024-12-29 NOTE — Progress Notes (Signed)
"  °  Subjective:  Patient ID: Becky Farrell, female    DOB: [birth year],  MRN: [id number]  Becky Farrell presents to clinic today for at risk foot care with h/o coagulation defect and painful thick toenails that are difficult to trim. Pain interferes with ambulation. Aggravating factors include wearing enclosed shoe gear. Pain is relieved with periodic professional debridement.  Chief Complaint  Patient presents with   Nail Problem    Thick painful toenails, 3 month follow up    Diabetes    A1C 5.6   New problem(s): None.   PCP is Caro Harlene POUR, NP.  Allergies[1]  Review of Systems: Negative except as noted in the HPI.  Objective: No changes noted in today's physical examination. There were no vitals filed for this visit. Becky Farrell is a pleasant 89 y.o. female WD, WN in NAD. AAO x 3.  Vascular Examination: Capillary refill time immediate b/l. Palpable pedal pulses. Pedal hair present b/l. Pedal edema absent. No pain with calf compression b/l. Skin temperature gradient WNL b/l. No cyanosis or clubbing b/l. No ischemia or gangrene noted b/l.   Neurological Examination: Sensation grossly intact b/l with 10 gram monofilament. Vibratory sensation intact b/l.   Dermatological Examination: Pedal skin with normal turgor, texture and tone b/l.  No open wounds. No interdigital macerations.   Toenails 1-5 b/l thick, discolored, elongated with subungual debris and pain on dorsal palpation.   No corns, calluses, nor porokeratotic lesions.  Musculoskeletal Examination: Muscle strength 5/5 to all lower extremity muscle groups bilaterally. No pain, crepitus or joint limitation noted with ROM b/l LE. No gross bony pedal deformities b/l. Patient ambulates independently without assistive aids.  Radiographs: None  Assessment/Plan: 1. Pain due to onychomycosis of nail   2. Coagulation defect   Consent given for treatment. Patient examined. All patient's and/or POA's  questions/concerns addressed on today's visit. Toenails 1-5 b/l debrided in length and girth without incident. Continue soft, supportive shoe gear daily. Report any pedal injuries to medical professional. Call office if there are any questions/concerns. -Patient/POA to call should there be question/concern in the interim.   Return in about 3 months (around 03/24/2025).  Delon LITTIE Merlin, DPM      [place] LOCATION: [address] [place], [place] [address]                   Office [phone]   [place] LOCATION: [address] [place], [place] [address] Office [phone]     [1]  Allergies Allergen Reactions   Clobetasol Other (Farrell Comments)    vertigo   Clobetasol Propionate Other (Farrell Comments)    This is ointment form UNSPECIFIED REACTION    Phenergan [Promethazine Hcl] Other (Farrell Comments)    Rapid heart rate   Dextromethorphan Palpitations and Other (Farrell Comments)    promethazine with DM, rapid hearbeat   Promethazine Other (Farrell Comments) and Palpitations    Rapid heart bea   Zithromax [Azithromycin] Palpitations and Other (Farrell Comments)    Per OSH report, pt unable to recall Rapid heart rate   "

## 2025-01-05 ENCOUNTER — Other Ambulatory Visit: Payer: Self-pay | Admitting: Nurse Practitioner

## 2025-01-05 NOTE — Telephone Encounter (Signed)
 High risk warning

## 2025-01-06 NOTE — Progress Notes (Unsigned)
" °  Cardiology Office Note   Date:  01/06/2025  ID:  Becky Farrell, DOB [birth year], MRN [id number] PCP: Becky Harlene POUR, NP  [facility] Providers Cardiologist:  Deatrice Cage, MD Cardiology APP:  [place]anchester, Aerilynn Goin H, PA-C { Click to update primary MD,subspecialty MD or APP then REFRESH:1}    History of Present Illness Becky Farrell is a 89 y.o. female with a hx of PVCs, SVT, palpitations, chronic dizziness, PE on warfarin, osteoporosis, hypertension, right breast cancer status postlumpectomy and radiation, mild aortic stenosis presents for follow-up.  Her and her husband reside at [facility].   Seen by Dr. Cage 02/01/2021 reported mild exertional dyspnea and PVCs.  Murmur was noted on exam.  ZIO and echo were recommended.  Her ZIO showed dominantly normal sinus rhythm with occasional PACs with a 2.7 burden, PVCs with a 2.7 burden, 35 runs of SVT fastest 5 beats with 187 bpm and longest 16.5 seconds with a rate of 110 bpm.  Echo showed LVEF 50 to 55%, no wall motion abnormality, mildly elevated PASP, bilateral atrial mildly dilated, mild MR, mild AS.   Seen 03/10/2021 and overall feeling well with occasional palpitations.  Atenolol  was transitioned to metoprolol    ABI's in 2022 showed no significant disease bilaterally.    The patient was last seen 02/27/24 and BP was mildly elevated.   Saw Dr. Lane (neurology) at [facility] for dizziness and he reported severe sensory polyneuropathy in the lower extremities. He recommended physical activity. Can consider vestibular therapy.  Patient was last seen 06/03/2024 and was overall stable from a cardiac perspective.  Blood pressure had improved.  Lisinopril  was decreased to see if this improves dizziness.  ROS: ***  Studies Reviewed      *** Risk Assessment/Calculations {Does this patient have ATRIAL FIBRILLATION?:[id number]} No BP recorded.  {Refresh Note OR Click here to enter BP  :1}***       Physical Exam VS:  There were no vitals  taken for this visit.       Wt Readings from Last 3 Encounters:  11/03/24 125 lb (56.7 kg)  10/13/24 124 lb (56.2 kg)  09/16/24 125 lb 3.2 oz (56.8 kg)    GEN: Well nourished, well developed in no acute distress NECK: No JVD; No carotid bruits CARDIAC: ***RRR, no murmurs, rubs, gallops RESPIRATORY:  Clear to auscultation without rales, wheezing or rhonchi  ABDOMEN: Soft, non-tender, non-distended EXTREMITIES:  No edema; No deformity   ASSESSMENT AND PLAN ***    {Are you ordering a CV Procedure (e.g. stress test, cath, DCCV, TEE, etc)?   Press F2        :[id number]}  Dispo: ***  Signed, Yanet Balliet VEAR Franchester, PA-C   "

## 2025-01-07 ENCOUNTER — Encounter: Payer: Self-pay | Admitting: Oncology

## 2025-01-07 ENCOUNTER — Telehealth: Payer: Self-pay | Admitting: Oncology

## 2025-01-07 ENCOUNTER — Ambulatory Visit: Admitting: Medical

## 2025-01-07 LAB — PROTIME-INR
INR: 3 — ABNORMAL HIGH
Prothrombin Time: 30 s — ABNORMAL HIGH (ref 9.0–11.5)

## 2025-01-07 NOTE — Telephone Encounter (Signed)
 Pt called to confirm next appt date/time - LH

## 2025-01-08 ENCOUNTER — Telehealth: Payer: Self-pay

## 2025-01-08 ENCOUNTER — Ambulatory Visit: Admitting: Medical

## 2025-01-08 VITALS — BP 158/92 | HR 71 | Ht 61.0 in | Wt 122.5 lb

## 2025-01-08 DIAGNOSIS — I10 Essential (primary) hypertension: Secondary | ICD-10-CM

## 2025-01-08 DIAGNOSIS — I493 Ventricular premature depolarization: Secondary | ICD-10-CM

## 2025-01-08 DIAGNOSIS — I350 Nonrheumatic aortic (valve) stenosis: Secondary | ICD-10-CM

## 2025-01-08 DIAGNOSIS — I340 Nonrheumatic mitral (valve) insufficiency: Secondary | ICD-10-CM

## 2025-01-08 DIAGNOSIS — I471 Supraventricular tachycardia, unspecified: Secondary | ICD-10-CM

## 2025-01-08 NOTE — Progress Notes (Signed)
 " Cardiology Office Note   Date:  01/08/2025  ID:  ELBA SCHABER, DOB 03-Feb-1934, MRN [id number] PCP: Caro Harlene POUR, NP  [facility] Providers Cardiologist:  Deatrice Cage, MD Cardiology APP:  Franchester Mikey DEL, PA-C   History of Present Illness Becky Farrell is a 89 y.o. female with a hx of PVCs, SVT, palpitations, chronic dizziness, PE on warfarin, osteoporosis, hypertension, right breast cancer status postlumpectomy and radiation, mild aortic stenosis presents for follow-up of HTN.     Seen by Dr. Cage 02/01/2021 reported mild exertional dyspnea and PVCs.  Murmur was noted on exam.  ZIO and echo were recommended.  Her ZIO showed dominantly normal sinus rhythm with occasional PACs with a 2.7 burden, PVCs with a 2.7 burden, 35 runs of SVT fastest 5 beats with 187 bpm and longest 16.5 seconds with a rate of 110 bpm.  Echo showed LVEF 50 to 55%, no wall motion abnormality, mildly elevated PASP, bilateral atrial mildly dilated, mild MR, mild AS.   Seen 03/10/2021 and overall feeling well with occasional palpitations.  Atenolol  was transitioned to metoprolol    ABI's in 2022 showed no significant disease bilaterally.    The patient was last seen 02/27/24 and BP was mildly elevated.   Saw Dr. Lane (neurology) at [facility] for dizziness and he reported severe sensory polyneuropathy in the lower extremities. He recommended physical activity. Can consider vestibular therapy.   Patient was last seen 06/03/2024 and was overall stable from a cardiac perspective.  Blood pressure had improved.  Lisinopril  was decreased to see if this improved dizziness.  Today, the patient reports elevated blood pressures. She was restarted on hydrochlorothiazide  . She is taking lisinopril  20mg  daily, lopressor  25mg BID, and hydrochlorothiazide  12.5mg  daily. Blood pressure high today, but she rushed in. Repeat BP 158/92. She has not been walking. She is eating low salt. She denies chest pain or SOB. She has been  doing PT for balance. She has minimal edema on her feet. She has occasional palpitations.  Studies Reviewed      Echo 03/2024 1. Left ventricular ejection fraction, by estimation, is 55 to 60%. The  left ventricle has normal function. The left ventricle has no regional  wall motion abnormalities. Left ventricular diastolic parameters are  consistent with Grade I diastolic  dysfunction (impaired relaxation). The average left ventricular global  longitudinal strain is -14.3 %. The global longitudinal strain is  abnormal.   2. Right ventricular systolic function is normal. The right ventricular  size is normal. There is mildly elevated pulmonary artery systolic  pressure. The estimated right ventricular systolic pressure is 36.4 mmHg.   3. Left atrial size was moderately dilated.   4. The mitral valve is normal in structure. Mild mitral valve  regurgitation. No evidence of mitral stenosis.   5. Tricuspid valve regurgitation is moderate.   6. The aortic valve is normal in structure. There is mild calcification  of the aortic valve. Aortic valve regurgitation is mild to moderate. Mild  aortic valve stenosis. Aortic valve mean gradient measures 16.0 mmHg.   7. There is borderline dilatation of the ascending aorta, measuring 38  mm.   8. The inferior vena cava is normal in size with greater than 50%  respiratory variability, suggesting right atrial pressure of 3 mmHg.    Echo 01/2021  1. Left ventricular ejection fraction, by estimation, is 50 to 55%. The  left ventricle has low normal function. The left ventricle has no regional  wall motion abnormalities. Left  ventricular diastolic parameters are  indeterminate.   2. Right ventricular systolic function is low normal. The right  ventricular size is normal. There is mildly elevated pulmonary artery  systolic pressure.   3. Left atrial size was mildly dilated.   4. Right atrial size was mildly dilated.   5. The mitral valve is  degenerative. Mild mitral valve regurgitation.   6. The aortic valve is tricuspid. Aortic valve regurgitation is mild.  Mild aortic valve sclerosis is present, with no evidence of aortic valve  stenosis.   7. The inferior vena cava is normal in size with greater than 50%  respiratory variability, suggesting right atrial pressure of 3 mmHg.    Heart monitor 01/2021 Patient had a min HR of 44 bpm, max HR of 187 bpm, and avg HR of 69 bpm.  Predominant underlying rhythm was Sinus Rhythm.  35 Supraventricular Tachycardia runs occurred, the run with the fastest interval lasting 5 beats with a max rate of 187 bpm, the longest lasting 16.5 secs with an avg rate of 110 bpm. Supraventricular Tachycardia was detected within +/- 45 seconds of symptomatic patient event(s). Occasional PACs with a burden of 2.7% and occasional PVCs with a burden of 2.1%.     ABIs 07/2021 Summary:  Right: Resting right ankle-brachial index is within normal range. No  evidence of significant right lower extremity arterial disease. The right  toe-brachial index is normal.   Left: Resting left ankle-brachial index is within normal range. No  evidence of significant left lower extremity arterial disease. The left  toe-brachial index is normal.      Physical Exam VS:  There were no vitals taken for this visit.       Wt Readings from Last 3 Encounters:  11/03/24 125 lb (56.7 kg)  10/13/24 124 lb (56.2 kg)  09/16/24 125 lb 3.2 oz (56.8 kg)    GEN: Well nourished, well developed in no acute distress NECK: No JVD; No carotid bruits CARDIAC: RRR, + murmur, no rubs, gallops RESPIRATORY:  Clear to auscultation without rales, wheezing or rhonchi  ABDOMEN: Soft, non-tender, non-distended EXTREMITIES:  trace pedal edema; No deformity   ASSESSMENT AND PLAN  HTN Blood pressures have been elevated over the last few months. She was restarted on hydrochlorothiazide  12.5mg  daily. Lisinopril  previously decreased due to  dizziness/balance issues, which improved symptoms. She is also taking Lopressor  25mg BID. Discussed increasing lisinopril  to 40mg  daily, but she would like to wait at this time. She is not walking, and wants to get back to that. She reports low salt diet.   Chronic anticoagulation Continue wafarin for h/o PE.   Mild AS/MR Most recent echo 03/2024 showed LVEF 55-60%, no WMA, G1DD, mild MR, mild AS.  PVC/PAC/pSVT She has occasional palpitations. Continue lopressor  25mg  BID.     Dispo: Follow-up in 3 months  Signed, Dereck Agerton VEAR Fishman, PA-C   "

## 2025-01-08 NOTE — Patient Instructions (Signed)
 Medication Instructions:  Your physician recommends that you continue on your current medications as directed. Please refer to the Current Medication list given to you today.    *If you need a refill on your cardiac medications before your next appointment, please call your pharmacy*  Lab Work: No labs ordered today    Testing/Procedures: No test ordered today   Follow-Up: At [facility], you and your health needs are our priority.  As part of our continuing mission to provide you with exceptional heart care, our providers are all part of one team.  This team includes your primary Cardiologist (physician) and Advanced Practice Providers or APPs (Physician Assistants and Nurse Practitioners) who all work together to provide you with the care you need, when you need it.  Your next appointment:   3 month(s)  Provider:   Deatrice Cage, MD or Cadence Franchester, PA-C

## 2025-01-08 NOTE — Telephone Encounter (Signed)
 Copied from CRM #[id number]. Topic: Clinical - Lab/Test Results >> Jan 08, 2025 11:42 AM Graeme ORN wrote: Reason for CRM: Patient would like callback regarding yesterdays lab work from [facility] INR. Thank You

## 2025-01-14 ENCOUNTER — Ambulatory Visit: Admitting: Nurse Practitioner

## 2025-03-17 ENCOUNTER — Inpatient Hospital Stay

## 2025-03-25 ENCOUNTER — Ambulatory Visit: Admitting: Podiatry

## 2025-03-30 ENCOUNTER — Encounter: Admitting: Dermatology

## 2025-04-20 ENCOUNTER — Ambulatory Visit: Admitting: Medical

## 2025-05-25 ENCOUNTER — Ambulatory Visit: Payer: Self-pay | Admitting: Nurse Practitioner

## 2025-09-16 ENCOUNTER — Inpatient Hospital Stay: Admitting: Oncology

## 2025-09-16 ENCOUNTER — Inpatient Hospital Stay
